# Patient Record
Sex: Male | Born: 1950 | Race: White | Hispanic: No | Marital: Married | Smoking: Former smoker
Health system: Southern US, Community
[De-identification: ages and names within clinical notes are randomized; demographics above are authoritative.]

## PROBLEM LIST (undated history)

## (undated) DIAGNOSIS — R0600 Dyspnea, unspecified: Secondary | ICD-10-CM

## (undated) DIAGNOSIS — J302 Other seasonal allergic rhinitis: Secondary | ICD-10-CM

## (undated) DIAGNOSIS — I10 Essential (primary) hypertension: Secondary | ICD-10-CM

## (undated) DIAGNOSIS — M4800 Spinal stenosis, site unspecified: Secondary | ICD-10-CM

## (undated) DIAGNOSIS — E785 Hyperlipidemia, unspecified: Secondary | ICD-10-CM

## (undated) DIAGNOSIS — J189 Pneumonia, unspecified organism: Secondary | ICD-10-CM

## (undated) DIAGNOSIS — IMO0002 Reserved for concepts with insufficient information to code with codable children: Secondary | ICD-10-CM

## (undated) DIAGNOSIS — Z9989 Dependence on other enabling machines and devices: Secondary | ICD-10-CM

## (undated) DIAGNOSIS — R079 Chest pain, unspecified: Secondary | ICD-10-CM

## (undated) DIAGNOSIS — G4733 Obstructive sleep apnea (adult) (pediatric): Secondary | ICD-10-CM

## (undated) DIAGNOSIS — G629 Polyneuropathy, unspecified: Secondary | ICD-10-CM

## (undated) DIAGNOSIS — I2510 Atherosclerotic heart disease of native coronary artery without angina pectoris: Secondary | ICD-10-CM

## (undated) DIAGNOSIS — M1712 Unilateral primary osteoarthritis, left knee: Secondary | ICD-10-CM

## (undated) DIAGNOSIS — R0609 Other forms of dyspnea: Secondary | ICD-10-CM

## (undated) DIAGNOSIS — K219 Gastro-esophageal reflux disease without esophagitis: Secondary | ICD-10-CM

## (undated) DIAGNOSIS — M1990 Unspecified osteoarthritis, unspecified site: Secondary | ICD-10-CM

## (undated) DIAGNOSIS — J45909 Unspecified asthma, uncomplicated: Secondary | ICD-10-CM

## (undated) HISTORY — PX: KNEE ARTHROSCOPY: SUR90

## (undated) HISTORY — PX: FOOT SURGERY: SHX648

## (undated) HISTORY — PX: WISDOM TOOTH EXTRACTION: SHX21

## (undated) HISTORY — DX: Atherosclerotic heart disease of native coronary artery without angina pectoris: I25.10

## (undated) HISTORY — PX: TONSILLECTOMY: SUR1361

## (undated) HISTORY — PX: JOINT REPLACEMENT: SHX530

## (undated) HISTORY — PX: BACK SURGERY: SHX140

## (undated) HISTORY — DX: Essential (primary) hypertension: I10

## (undated) HISTORY — PX: POSTERIOR LUMBAR FUSION: SHX6036

## (undated) HISTORY — DX: Chest pain, unspecified: R07.9

---

## 1989-02-11 HISTORY — PX: KNEE ARTHROSCOPY: SUR90

## 1997-06-13 HISTORY — PX: ANTERIOR CERVICAL DECOMP/DISCECTOMY FUSION: SHX1161

## 2001-05-29 ENCOUNTER — Ambulatory Visit (HOSPITAL_COMMUNITY): Admission: RE | Admit: 2001-05-29 | Discharge: 2001-05-29 | Payer: Self-pay | Admitting: General Surgery

## 2002-04-10 ENCOUNTER — Encounter: Payer: Self-pay | Admitting: Family Medicine

## 2002-04-10 ENCOUNTER — Ambulatory Visit (HOSPITAL_COMMUNITY): Admission: RE | Admit: 2002-04-10 | Discharge: 2002-04-10 | Payer: Self-pay | Admitting: Family Medicine

## 2002-09-09 ENCOUNTER — Ambulatory Visit (HOSPITAL_COMMUNITY): Admission: RE | Admit: 2002-09-09 | Discharge: 2002-09-09 | Payer: Self-pay | Admitting: Family Medicine

## 2002-09-09 ENCOUNTER — Encounter: Payer: Self-pay | Admitting: Family Medicine

## 2003-07-15 ENCOUNTER — Ambulatory Visit (HOSPITAL_COMMUNITY): Admission: RE | Admit: 2003-07-15 | Discharge: 2003-07-15 | Payer: Self-pay | Admitting: Family Medicine

## 2004-01-09 ENCOUNTER — Ambulatory Visit (HOSPITAL_COMMUNITY): Admission: RE | Admit: 2004-01-09 | Discharge: 2004-01-09 | Payer: Self-pay | Admitting: Orthopedic Surgery

## 2004-11-25 ENCOUNTER — Ambulatory Visit (HOSPITAL_COMMUNITY): Admission: RE | Admit: 2004-11-25 | Discharge: 2004-11-25 | Payer: Self-pay | Admitting: Family Medicine

## 2004-12-30 ENCOUNTER — Ambulatory Visit (HOSPITAL_COMMUNITY): Admission: RE | Admit: 2004-12-30 | Discharge: 2004-12-30 | Payer: Self-pay | Admitting: General Surgery

## 2005-03-01 ENCOUNTER — Ambulatory Visit (HOSPITAL_COMMUNITY): Admission: RE | Admit: 2005-03-01 | Discharge: 2005-03-01 | Payer: Self-pay | Admitting: Family Medicine

## 2005-03-18 ENCOUNTER — Ambulatory Visit (HOSPITAL_COMMUNITY): Admission: RE | Admit: 2005-03-18 | Discharge: 2005-03-18 | Payer: Self-pay | Admitting: Family Medicine

## 2005-04-04 ENCOUNTER — Ambulatory Visit: Payer: Self-pay | Admitting: Orthopedic Surgery

## 2005-05-09 ENCOUNTER — Ambulatory Visit: Payer: Self-pay | Admitting: Orthopedic Surgery

## 2006-02-22 ENCOUNTER — Ambulatory Visit (HOSPITAL_COMMUNITY): Admission: RE | Admit: 2006-02-22 | Discharge: 2006-02-22 | Payer: Self-pay | Admitting: Family Medicine

## 2007-03-16 ENCOUNTER — Encounter: Admission: RE | Admit: 2007-03-16 | Discharge: 2007-03-16 | Payer: Self-pay | Admitting: Neurosurgery

## 2007-04-20 ENCOUNTER — Ambulatory Visit (HOSPITAL_COMMUNITY): Admission: RE | Admit: 2007-04-20 | Discharge: 2007-04-21 | Payer: Self-pay | Admitting: Neurosurgery

## 2007-06-01 ENCOUNTER — Other Ambulatory Visit: Admission: RE | Admit: 2007-06-01 | Discharge: 2007-06-01 | Payer: Self-pay | Admitting: Family Medicine

## 2007-06-01 ENCOUNTER — Encounter (INDEPENDENT_AMBULATORY_CARE_PROVIDER_SITE_OTHER): Payer: Self-pay | Admitting: Family Medicine

## 2007-06-14 HISTORY — PX: ANTERIOR CERVICAL DECOMP/DISCECTOMY FUSION: SHX1161

## 2007-07-18 ENCOUNTER — Inpatient Hospital Stay (HOSPITAL_COMMUNITY): Admission: RE | Admit: 2007-07-18 | Discharge: 2007-07-21 | Payer: Self-pay | Admitting: Neurosurgery

## 2009-06-02 ENCOUNTER — Ambulatory Visit (HOSPITAL_COMMUNITY): Admission: RE | Admit: 2009-06-02 | Discharge: 2009-06-02 | Payer: Self-pay | Admitting: Family Medicine

## 2010-10-26 NOTE — H&P (Signed)
NAMEPIERO, MUSTARD             ACCOUNT NO.:  [id number]   MEDICAL RECORD NO.:  [id number]          PATIENT TYPE:  AMB   LOCATION:  SDS                          FACILITY:  [facility]   PHYSICIAN:  Hilda Lias, M.D.   DATE OF BIRTH:  [birth year]   DATE OF ADMISSION:  04/20/2007  DATE OF DISCHARGE:                              HISTORY & PHYSICAL   Mr. Patrick Mathews is a gentleman whom, 23 years ago, underwent L5-1 fusion.  Now he has been complaining of back pain with radiation down to both  legs.  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  The patient had an MRI of the lumbar spine and cervical.  He  was sent to [place] for evaluation.   PAST MEDICAL HISTORY:  Knee surgery bilaterally, lumbar fusion.   ALLERGIES:  He is not allergic to any medications.   SOCIAL HISTORY:  He drinks socially.  He smokes 5 cigarettes a day.   FAMILY HISTORY:  Negative.   REVIEW OF SYSTEMS:  Positive for back pain.   PHYSICAL EXAMINATION:  HEAD, EARS, NOSE AND THROAT:  Normal.  NECK:  He is able to extend by  lateralization process right down to the  shoulder girdle.  LUNGS:  Clear.  HEART:  Sounds normal.  ABDOMEN:  Normal.  EXTREMITIES:  Normal pulses.  NEURO:  Mental status normal.  Cranial nerves normal.  Strength showed  weakness of the left biceps and triceps.  There is no obvious  fasciculation.  He has decreased flexion of the lumbar spine.   X-ray of the lumbar spine showed that he has a fusion of the L5-1 with  stenosis at the L4-5 with spondylolisthesis.  At the level of C6-C7  cervical, he has a herniated disk with a large fragment going to the  canal.   CLINICAL IMPRESSION:  C6-C7 herniated disk with a free fragment.  L4-5  stenosis.   RECOMMENDATIONS:  The patient is being admitted for surgery.  The  procedure will  be anterior cervical diskectomy at L6-L7 with a bone  graft and a plate.  He knows about the risks such as infection, CSF  leak, worsening pain, paralysis, need for further surgery.  He is fully  aware that this will not resolve the problem in the lumbar area, and  that will be taken care of in the next 6 weeks.          ______________________________  Hilda Lias, M.D.    EB/MEDQ  D:  04/20/2007  T:  04/21/2007  Job:  [id number]

## 2010-10-26 NOTE — H&P (Signed)
NAMEDAVIONE, LENKER             ACCOUNT NO.:  [id number]   MEDICAL RECORD NO.:  [id number]          PATIENT TYPE:  INP   LOCATION:  3009                         FACILITY:  [facility]   PHYSICIAN:  Hilda Lias, M.D.   DATE OF BIRTH:  02-17-1951   DATE OF ADMISSION:  07/18/2007  DATE OF DISCHARGE:                              HISTORY & PHYSICAL   Mr. Arroyave is a gentleman who underwent anterior cervical fusion.  He 23  years ago underwent an L5-S1 fusion in situ with iliac crest because of  spondylolisthesis.  Nevertheless, lately he had been complaining of neck  pain, which already was taken care of, but now he realized that every  time he walks more than two blocks he had to sit to get rid of the pain  in both legs.  He denies any problem with bladder or bowel.   PAST MEDICAL HISTORY:  He has had bilateral knee surgery.  He had a  lumbar fusion 23 years ago, cervical fusion by me last year.   SOCIAL HISTORY:  He drinks socially.  He smokes five cigarettes a day.   FAMILY HISTORY:  Negative.   REVIEW OF SYSTEMS:  Positive for back pain.   PHYSICAL EXAMINATION:  GENERAL:  When the patient came to my office he  was not in any acute distress.  HEENT:  Head, nose and throat normal.  NECK:  There is a scar anteriorly.  He has a good flexibility.  LUNGS:  Clear.  HEART:  Heart sounds normal.  There were __________  pulse.  NEUROLOGIC :  Examination essentially normal with some mild weakness on  dorsiflexion of both feet.  The straight leg raise is positive  bilaterally about 60 degrees.   Lumbar spine x-ray showed a fusion at the level L5-1, which is solid,  but he has a spondylolisthesis at L4-5.   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  He is status post L5-S1 fusion.   RECOMMENDATIONS:  The patient is being admitted for surgery.  The  procedure will be L4-5 diskectomy, interbody fusion and pedicle screws.  He knows about the risks of  infection, CSF leak, worsening pain,  paralysis, need for surgery.           ______________________________  Hilda Lias, M.D.     EB/MEDQ  D:  07/18/2007  T:  07/18/2007  Job:  [id number]

## 2010-10-26 NOTE — Op Note (Signed)
Patrick Mathews, Patrick Mathews             ACCOUNT NO.:  [id number]   MEDICAL RECORD NO.:  [id number]          PATIENT TYPE:  AMB   LOCATION:  SDS                          FACILITY:  [facility]   PHYSICIAN:  Hilda Lias, M.D.   DATE OF BIRTH:  March 17, 1951   DATE OF PROCEDURE:  04/20/2007  DATE OF DISCHARGE:                               OPERATIVE REPORT   PREOPERATIVE DIAGNOSIS:  C6-7 herniated disk with a large fragment to  the left.   POSTOPERATIVE DIAGNOSIS:  C6-7 herniated disk with a large fragment to  the left.   PROCEDURE:  Anterior C6-7 diskectomy, decompression of the spinal cord  and the nerve root, interbody fusion with allograft.  Plate.  Microscope.   SURGEON:  Hilda Lias, M.D.   ASSISTANT:  Coletta Memos, M.D.   CLINICAL HISTORY:  [name] has severe neck pain with radiation to the  left upper extremity.  X-rays show a large herniated disk at the level  of C6-7 central to the left.  Also, he had stenosis at L4-5.  Surgery  was advised.  He knows that this will not take care of the problem with  the lumbar spine.   PROCEDURE:  [name] was taken to the OR and after intubation, the  left side of the neck was prepped with DuraPrep.  Transverse incision  was made through the skin, subcutaneous tissue and platysma down to the  cervical spine.  X-ray showed that the needle was at the level of C5-6.  The patient has big shoulders.  From then on we dropped down and we  opened the anterior ligament at the level of C6-7.  A large amount of  degenerative disk was removed.  Total diskectomy using the microscope  and the drill was achieved.  We opened the posterior ligament.  The  patient had quite a bit of stenosis to the right side.  To the left side  we found at least 6-7 fragments displacing the spinal cord and the nerve  root.  Gross total decompression was accomplished not only at the level  of the foramen but also central.  Then a stem was inserted in the  allograft, a 9  mm was inserted, followed by four screws.  Lateral  cervical spine:  Were able to see only up to the level of C5-6.  Nevertheless, by visualization we knew we were at the body of C6 and C7.  From then on the area was irrigated and once we achieved complete  hemostasis, the wound was closed with Vicryl and a Steri-Strip.           ______________________________  Hilda Lias, M.D.     EB/MEDQ  D:  04/20/2007  T:  04/21/2007  Job:  [id number]

## 2010-10-26 NOTE — Op Note (Signed)
NAMEEUGEAN, ARNOTT             ACCOUNT NO.:  [id number]   MEDICAL RECORD NO.:  [id number]          PATIENT TYPE:  INP   LOCATION:  3009                         FACILITY:  [facility]   PHYSICIAN:  Hilda Lias, M.D.   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  Neurogenic claudication.  Chronic radiculopathy.  Status post in situ  fusion, L5-S1.   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  Neurogenic claudication.  Chronic radiculopathy.  Status post in situ  fusion, L5-S1.   PROCEDURE:  Bilateral L4 laminectomy, bilateral L4-5 diskectomy and  facetectomy, interbody fusion with cages, pedicle screws L4-5,  posterolateral arthrodesis with autograft and BMP and Cell Saver  __________  .   SURGEON:  Hilda Lias, M.D.   ASSISTANT:  Coletta Memos, M.D.   CLINICAL HISTORY:  Mr. Gammel is a gentleman who had been complaining of  back pain progressing to both legs.  Twenty-five years ago he underwent  in situ fusion of L5-1.  X-rays show severe stenosis with  spondylolisthesis at L4-L5.  Surgery was advised and the risks were  explained to them.   PROCEDURE:  The patient was taken to the OR and after intubation he was  positioned in a prone manner.  The skin was cleaned with DuraPrep.  Midline incision was made through the previous one.  We were able to  find the spinous process of L4.  The incision was carried out all the  way laterally until we were able to see and feel the facet of 4-5.  The  patient had quite a bit of scar tissue and the most difficult part of  the procedure was to do lysis of adhesions to be able to identify the  dural sac.  Once this was done, x-rays showed that we probably were at  the L4-5.  From then on we tried to get into the space but it was quite  narrow with quite a  bit of adhesion.  Because of that, we proceeded with  bilateral L4 facetectomy.  This allowed [place] to get into the disk space  and total gross diskectomy was achieved.  The endplates were drilled.  Then 2 cages of 14 x 22 with autograft and BMP were introduced.  Then  using the C-arm in AP and lateral view we drilled the pedicle of 4 and  5.  Then a a probe was inserted and then we introduced 4 screws of 6.5 x  50, two of them at L4 and 6.5 x 45 at the level of L5.  X-rays showed  good alignment.  Then the screws were connected with the rod  and  capped.  A cross-link was used to secure the area.  We went laterally  and we drilled the lateral aspect of the facet at L4-5 as well as the  takeoff of the transverse process.  A mix of autograft and BMP was used  for posterolateral arthrodesis.  At the end we checked  again the foramen  and the L4 and L5 were decompressed bilaterally.  Valsalva maneuver was  negative.  The wound was closed with Vicryl and Steri-Strips.           ______________________________  Hilda Lias, M.D.    EB/MEDQ  D:  07/18/2007  T:  07/19/2007  Job:  [id number]

## 2010-10-29 NOTE — H&P (Signed)
Patrick Mathews, Patrick Mathews             ACCOUNT NO.:  [id number]   MEDICAL RECORD NO.:  [id number]          PATIENT TYPE:  AMB   LOCATION:                                FACILITY:  [facility]   PHYSICIAN:  Dalia Heading, M.D.  DATE OF BIRTH:  01/12/1951   DATE OF ADMISSION:  12/30/2004  DATE OF DISCHARGE:  LH                                HISTORY & PHYSICAL   CHIEF COMPLAINT:  Longstanding gastrointestinal reflux disease.   HISTORY OF PRESENT ILLNESS:  The patient is a 60 year old white male who was  referred for endoscopic evaluation.  He needs an EGD for recurrent GERD.  He  has had longstanding intermittent GERD with heartburn.  Multiple family  members suffer from GERD.  He has never had an EGD.  Occasional dysphagia is  noted.  No abdominal pain, weight loss, nausea, vomiting, diarrhea,  constipation, hematochezia or melena have been noted.   PAST MEDICAL HISTORY:  Unremarkable.   PAST SURGICAL HISTORY:  Colonoscopy in 2002.   CURRENT MEDICATIONS:  Lipitor, Aciphex and Zyrtec.   ALLERGIES:  No known drug allergies.   REVIEW OF SYSTEMS:  Noncontributory.   PHYSICAL EXAMINATION:  GENERAL APPEARANCE:  The patient is a well-developed,  well-nourished, white male in no acute distress.  VITAL SIGNS:  He is afebrile and vital signs are stable.  LUNGS:  Clear to auscultation with equal breath sounds bilaterally.  HEART:  Regular rate and rhythm without S3, S4 or murmurs.  ABDOMEN:  Soft, nontender and nondistended.  No hepatosplenomegaly or masses  are noted.   IMPRESSION:  Gastrointestinal reflux disease.   PLAN:  The patient is scheduled for an EGD on December 30, 2004.  The risks and  benefits of the procedure, including bleeding and perforation, were fully  explained to the patient, who gave informed consent.      Dalia Heading, M.D.  Electronically Signed     MAJ/MEDQ  D:  12/07/2004  T:  12/07/2004  Job:  [id number]   cc:   [facility]  Fax: [phone]   Corrie Mckusick, M.D.  Fax: [phone]

## 2010-10-29 NOTE — Discharge Summary (Signed)
NAMECAYLON, Patrick Mathews             ACCOUNT NO.:  [id number]   MEDICAL RECORD NO.:  [id number]          PATIENT TYPE:  INP   LOCATION:  3009                         FACILITY:  [facility]   PHYSICIAN:  Hilda Lias, M.D.   DATE OF BIRTH:  [birth year]   DATE OF ADMISSION:  07/18/2007  DATE OF DISCHARGE:  07/21/2007                               DISCHARGE SUMMARY   ADMISSION DIAGNOSES:  1. 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  2. L4-5 spondylolisthesis.   DISCHARGE DIAGNOSES:  1. 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  2. L4-5 spondylolisthesis.   CLINICAL HISTORY:  The patient was admitted because of back pain with  radiation to both legs.  Previously this gentleman underwent an anterior  cervical diskectomy.  Surgery was advised.   HOSPITAL COURSE:  The patient was taken to surgery on July 18, 2007,  and an L4-5 diskectomy was done.  Today at almost 72 hours after surgery  the patient was discharged by Dr. Danae Orleans. [name].   CONDITION ON DISCHARGE:  Improved.   DISCHARGE MEDICATIONS:  1. Percocet.  2. Diazepam.   DIET:  Regular diet.   ACTIVITY:  Not to drive for the next two weeks.   FOLLOWUP:  He is going to be seen by me in four weeks.           ______________________________  Hilda Lias, M.D.     EB/MEDQ  D:  08/21/2007  T:  08/21/2007  Job:  [id number]

## 2010-10-29 NOTE — H&P (Signed)
[facility]  Patient:    Patrick, Mathews Visit Number: [id number] MRN: [id number]          Service Type: END Location: DAY Attending Physician:  Dalia Heading Dictated by:   Franky Macho, M.D. Admit Date:  05/29/2001 Discharge Date: 05/29/2001   CC:         Colette Ribas, M.D.                         History and Physical  DATE OF BIRTH:  09/06/1950  CHIEF COMPLAINT:  Need for screening colonoscopy.  HISTORY OF PRESENT ILLNESS:  Patient is a 60 year old white male who is referred for a screening colonoscopy.  He denies any lightheadedness, weight loss, fever, abdominal pain, constipation, diarrhea, hematochezia, melena.  He has never had a colonoscopy.  Denies any hemorrhoidal problems.  There is no family history of colon carcinoma.  PAST MEDICAL HISTORY:  Unremarkable.  PAST SURGICAL HISTORY:  Knee surgery and back surgery.  CURRENT MEDICATIONS:  Aspirin daily.  ALLERGIES:  SULFA.  REVIEW OF SYSTEMS:  Unremarkable.  PHYSICAL EXAMINATION  GENERAL:  Patient is a well-developed, well-nourished white male in no acute distress.  VITAL SIGNS:  Afebrile.  Vital signs are stable.  LUNGS:  Clear to auscultation with equal breath sounds bilaterally.  HEART:  Regular rate and rhythm without S3, S4, murmurs.  ABDOMEN:  Soft, nontender, nondistended.  No hepatosplenomegaly or masses are noted.  RECTAL:  Deferred until the procedure.  IMPRESSION:  Need for screening colonoscopy.  PLAN:  The patient is scheduled for a colonoscopy on May 29, 2001.  The risks and benefits of the procedure including bleeding and perforation were fully explained to the patient.  Gave him informed consent. Dictated by:   Franky Macho, M.D. Attending Physician:  Dalia Heading DD:  05/24/01 TD:  05/24/01 Job: [id number]

## 2011-03-03 LAB — BASIC METABOLIC PANEL
BUN: 10
CO2: 30
Calcium: 9.3
Chloride: 102
Creatinine, Ser: 1.09
GFR calc Af Amer: >60
GFR calc non Af Amer: >60
Glucose, Bld: 100 — ABNORMAL HIGH
Potassium: 3.8
Sodium: 138

## 2011-03-03 LAB — CBC
HCT: 43.8
Hemoglobin: 14.9
MCHC: 34.1
MCV: 90
Platelets: 216
RBC: 4.86
RDW: 13
WBC: 6.2

## 2011-03-04 LAB — ABO/RH: ABO/RH(D): O POS

## 2011-03-04 LAB — TYPE AND SCREEN
ABO/RH(D): O POS
Antibody Screen: NEGATIVE

## 2011-03-22 LAB — CBC
HCT: 44.8
Hemoglobin: 15.3
MCHC: 34
MCV: 91.4
Platelets: 221
RBC: 4.9
RDW: 13.3
WBC: 5.5

## 2011-06-14 HISTORY — PX: FOOT SURGERY: SHX648

## 2011-06-14 HISTORY — PX: BUNIONECTOMY: SHX129

## 2012-10-31 ENCOUNTER — Encounter (HOSPITAL_COMMUNITY): Payer: Self-pay | Admitting: Pharmacy Technician

## 2012-10-31 NOTE — H&P (Signed)
  NTS SOAP Note  Vital Signs:  Vitals as of: 10/30/2012: Systolic 114: Diastolic 76: Heart Rate 75: Temp 98.52F: Height 97ft 11in: Weight 221Lbs 0 Ounces: BMI 30.82  BMI : 30.82 kg/m2  Subjective: This 62 Years 53 Months old Male presents for screening colonoscopy. Patient  last had a colonoscopy over 10 years ago. He denies any GI complaints. No family history of colon cancer.  Review of Symptoms:  Constitutional:unremarkable   Head:unremarkable    Eyes:unremarkable   Nose/Mouth/Throat:unremarkable Cardiovascular:  unremarkable   Respiratory:unremarkable   Gastrointestinal:  unremarkable   Genitourinary:unremarkable        Joint, back, and neck pain Skin:unremarkable Hematolgic/Lymphatic:unremarkable        hayfever   Past Medical History:    Reviewed   Past Medical History  Surgical History:  back, foot, knee surgeries Medical Problems:  high cholesterol levels Allergies: nkda Medications: lipitor, gabapentin, meloxicam, aciphex   Social History:Reviewed  Social History  Preferred Language: English Race:  White Ethnicity: Not Hispanic / Latino Age: 62 Years 3 Months Marital Status:  M Alcohol:  No Recreational drug(s):  No   Smoking Status: Unknown if ever smoked reviewed on 10/30/2012 Functional Status reviewed on mm/dd/yyyy ------------------------------------------------ Bathing: Normal Cooking: Normal Dressing: Normal Driving: Normal Eating: Normal Managing Meds: Normal Oral Care: Normal Shopping: Normal Toileting: Normal Transferring: Normal Walking: Normal Cognitive Status reviewed on mm/dd/yyyy ------------------------------------------------ Attention: Normal Decision Making: Normal Language: Normal Memory: Normal Motor: Normal Perception: Normal Problem Solving: Normal Visual and Spatial: Normal   Family History:  Reviewed  Family Health History Mother, Unknown; Coronary arteriosclerosis;   Father, Unknown; Healthy;     Objective Information: General:  Well appearing, well nourished in no distress. Heart:  RRR, no murmur Lungs:    CTA bilaterally, no wheezes, rhonchi, rales.  Breathing unlabored. Abdomen:Soft, NT/ND, no HSM, no masses.    deferred to procedure  Assessment: need for screening colonoscopy  Diagnosis &amp; Procedure Smart Code   Plan: scheduled for colonoscopy on 11/06/2012   Patient Education:Alternative treatments to surgery were discussed with patient (and family).  Risks and benefits  of procedure were fully explained to the patient (and family) who gave informed consent. Patient/family questions were addressed.  Follow-up:Pending Surgery

## 2012-11-06 ENCOUNTER — Ambulatory Visit (HOSPITAL_COMMUNITY)
Admission: RE | Admit: 2012-11-06 | Discharge: 2012-11-06 | Disposition: A | Discharge status: Home / Self Care | Payer: 59 | Source: Ambulatory Visit | Attending: General Surgery | Admitting: General Surgery

## 2012-11-06 ENCOUNTER — Encounter (HOSPITAL_COMMUNITY): Payer: Self-pay | Admitting: *Deleted

## 2012-11-06 ENCOUNTER — Encounter (HOSPITAL_COMMUNITY)
Admission: RE | Disposition: A | Discharge status: Home / Self Care | Payer: Self-pay | Source: Ambulatory Visit | Attending: General Surgery

## 2012-11-06 DIAGNOSIS — Z1211 Encounter for screening for malignant neoplasm of colon: Secondary | ICD-10-CM | POA: Insufficient documentation

## 2012-11-06 DIAGNOSIS — E7800 Pure hypercholesterolemia, unspecified: Secondary | ICD-10-CM | POA: Insufficient documentation

## 2012-11-06 HISTORY — DX: Gastro-esophageal reflux disease without esophagitis: K21.9

## 2012-11-06 HISTORY — PX: COLONOSCOPY: SHX5424

## 2012-11-06 HISTORY — DX: Hyperlipidemia, unspecified: E78.5

## 2012-11-06 SURGERY — COLONOSCOPY
Anesthesia: Moderate Sedation

## 2012-11-06 MED ORDER — MEPERIDINE HCL 100 MG/ML IJ SOLN
INTRAMUSCULAR | Status: AC
  Filled 2012-11-06: qty 1

## 2012-11-06 MED ORDER — MIDAZOLAM HCL 5 MG/5ML IJ SOLN
INTRAMUSCULAR | Status: DC | PRN
  Administered 2012-11-06: 3 mg via INTRAVENOUS

## 2012-11-06 MED ORDER — SODIUM CHLORIDE 0.9 % IV SOLN
INTRAVENOUS | Status: DC
  Administered 2012-11-06: 09:00:00 via INTRAVENOUS

## 2012-11-06 MED ORDER — MIDAZOLAM HCL 5 MG/5ML IJ SOLN
INTRAMUSCULAR | Status: AC
  Filled 2012-11-06: qty 10

## 2012-11-06 MED ORDER — MEPERIDINE HCL 25 MG/ML IJ SOLN
INTRAMUSCULAR | Status: DC | PRN
  Administered 2012-11-06: 50 mg via INTRAVENOUS

## 2012-11-06 NOTE — Op Note (Signed)
[facility] [address] [place], [address]   COLONOSCOPY PROCEDURE REPORT  PATIENT: Patrick Mathews, Patrick Mathews  MR#: [id number] BIRTHDATE: [birth year] , 62  yrs. old GENDER: Male ENDOSCOPIST: Franky Macho, MD REFERRED XB:[id number], John PROCEDURE DATE:  11/06/2012 PROCEDURE:   Colonoscopy, screening ASA CLASS:   Class II INDICATIONS:Average risk patient for colon cancer. MEDICATIONS: Versed 3 mg IV and Demerol 50 mg IV  DESCRIPTION OF PROCEDURE:   After the risks benefits and alternatives of the procedure were thoroughly explained, informed consent was obtained.  A digital rectal exam revealed no abnormalities of the rectum.   The EC-[id number] ([id number])  endoscope was introduced through the anus and advanced to the cecum, which was identified by both the appendix and ileocecal valve. No adverse events experienced.   The quality of the prep was adequate, using MoviPrep  The instrument was then slowly withdrawn as the colon was fully examined.      COLON FINDINGS: A normal appearing cecum, ileocecal valve, and appendiceal orifice were identified.  The ascending, hepatic flexure, transverse, splenic flexure, descending, sigmoid colon and rectum appeared unremarkable.  No polyps or cancers were seen. Retroflexed views revealed no abnormalities. The time to cecum=3 minutes 0 seconds.  Withdrawal time=4 minutes 0 seconds.  The scope was withdrawn and the procedure completed. COMPLICATIONS: There were no complications.  ENDOSCOPIC IMPRESSION: Normal colon  RECOMMENDATIONS: Repeat Colonscopy in 10 years.   eSigned:  Franky Macho, MD 11/06/2012 9:48 AM   cc:

## 2012-11-07 ENCOUNTER — Encounter (HOSPITAL_COMMUNITY): Payer: Self-pay | Admitting: General Surgery

## 2013-08-19 DIAGNOSIS — M5414 Radiculopathy, thoracic region: Secondary | ICD-10-CM | POA: Insufficient documentation

## 2013-10-08 ENCOUNTER — Ambulatory Visit (HOSPITAL_COMMUNITY)
Admission: RE | Admit: 2013-10-08 | Discharge: 2013-10-08 | Disposition: A | Discharge status: Home / Self Care | Payer: 59 | Source: Ambulatory Visit | Attending: Family Medicine | Admitting: Family Medicine

## 2013-10-08 ENCOUNTER — Other Ambulatory Visit (HOSPITAL_COMMUNITY): Payer: Self-pay | Admitting: Family Medicine

## 2013-10-08 DIAGNOSIS — R0602 Shortness of breath: Secondary | ICD-10-CM | POA: Insufficient documentation

## 2013-10-08 DIAGNOSIS — J209 Acute bronchitis, unspecified: Secondary | ICD-10-CM

## 2013-10-08 DIAGNOSIS — R059 Cough, unspecified: Secondary | ICD-10-CM | POA: Insufficient documentation

## 2013-10-08 DIAGNOSIS — R05 Cough: Secondary | ICD-10-CM | POA: Insufficient documentation

## 2013-10-08 DIAGNOSIS — R0989 Other specified symptoms and signs involving the circulatory and respiratory systems: Secondary | ICD-10-CM | POA: Insufficient documentation

## 2013-10-08 DIAGNOSIS — Z87891 Personal history of nicotine dependence: Secondary | ICD-10-CM | POA: Insufficient documentation

## 2013-11-08 ENCOUNTER — Other Ambulatory Visit (HOSPITAL_COMMUNITY): Payer: Self-pay | Admitting: Family Medicine

## 2013-11-08 ENCOUNTER — Ambulatory Visit (HOSPITAL_COMMUNITY)
Admission: RE | Admit: 2013-11-08 | Discharge: 2013-11-08 | Disposition: A | Discharge status: Home / Self Care | Payer: 59 | Source: Ambulatory Visit | Attending: Family Medicine | Admitting: Family Medicine

## 2013-11-08 DIAGNOSIS — R059 Cough, unspecified: Secondary | ICD-10-CM | POA: Insufficient documentation

## 2013-11-08 DIAGNOSIS — R748 Abnormal levels of other serum enzymes: Secondary | ICD-10-CM

## 2013-11-08 DIAGNOSIS — D72829 Elevated white blood cell count, unspecified: Secondary | ICD-10-CM

## 2013-11-08 DIAGNOSIS — J189 Pneumonia, unspecified organism: Secondary | ICD-10-CM

## 2013-11-08 DIAGNOSIS — R05 Cough: Secondary | ICD-10-CM | POA: Insufficient documentation

## 2013-11-11 DIAGNOSIS — J189 Pneumonia, unspecified organism: Secondary | ICD-10-CM

## 2013-11-11 HISTORY — DX: Pneumonia, unspecified organism: J18.9

## 2014-04-07 ENCOUNTER — Other Ambulatory Visit (HOSPITAL_COMMUNITY): Payer: Self-pay | Admitting: Family Medicine

## 2014-04-08 ENCOUNTER — Other Ambulatory Visit (HOSPITAL_COMMUNITY): Payer: Self-pay | Admitting: Family Medicine

## 2014-04-08 DIAGNOSIS — R918 Other nonspecific abnormal finding of lung field: Secondary | ICD-10-CM

## 2014-04-16 ENCOUNTER — Ambulatory Visit (HOSPITAL_COMMUNITY): Payer: 59

## 2014-04-23 ENCOUNTER — Ambulatory Visit (HOSPITAL_COMMUNITY)
Admission: RE | Admit: 2014-04-23 | Discharge: 2014-04-23 | Disposition: A | Discharge status: Home / Self Care | Payer: 59 | Source: Ambulatory Visit | Attending: Family Medicine | Admitting: Family Medicine

## 2014-04-23 DIAGNOSIS — R918 Other nonspecific abnormal finding of lung field: Secondary | ICD-10-CM

## 2014-04-23 LAB — POCT I-STAT CREATININE: Creatinine, Ser: 1.2 mg/dL (ref 0.50–1.35)

## 2014-04-23 MED ORDER — IOHEXOL 300 MG/ML  SOLN
80.0000 mL | Freq: Once | INTRAMUSCULAR | Status: AC | PRN
Start: 2014-04-23 — End: 2014-04-23
  Administered 2014-04-23: 80 mL via INTRAVENOUS

## 2014-05-07 ENCOUNTER — Encounter: Payer: Self-pay | Admitting: Internal Medicine

## 2014-05-07 ENCOUNTER — Ambulatory Visit (INDEPENDENT_AMBULATORY_CARE_PROVIDER_SITE_OTHER): Payer: 59 | Admitting: Internal Medicine

## 2014-05-07 VITALS — BP 130/80 | HR 60 | Ht 70.0 in | Wt 229.0 lb

## 2014-05-07 DIAGNOSIS — R0600 Dyspnea, unspecified: Secondary | ICD-10-CM

## 2014-05-07 DIAGNOSIS — R918 Other nonspecific abnormal finding of lung field: Secondary | ICD-10-CM

## 2014-05-07 NOTE — Assessment & Plan Note (Signed)
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  This is too small too bx or PET scan so the only option is excisional bx vs watch in 3 m with apples to apples comparison using same CT scanner and that is what I would rec, along with Quantiferon GOLD on return here.  Discussed in detail all the  indications, usual  risks and alternatives  relative to the benefits with patient who agrees to proceed with conservative f/u as planned

## 2014-05-07 NOTE — Patient Instructions (Addendum)
You will need a follow up CT chest a year from now and I will let Dr Hilma Favors know and place you in our recall file   You are cleared for knee surgery  Late add: rec 3 m f/u with ct at same location as 11/11 and ov in with pfts in 3 months (all same day hopefully)

## 2014-05-07 NOTE — Assessment & Plan Note (Signed)
Assoc with wt gain/ likely deconditioning related to acute illness but nothing on exam or by CT chest that would contraindicate clearance for knee surgery at this point.

## 2014-05-07 NOTE — Progress Notes (Signed)
Subjective:     Patient ID: Patrick Mathews, male   DOB: [birth year]   MRN: [id number]  HPI  73 yowm quit smoking Jan 2015  Able to do HS sports s inhalers then started using inhalers prn in the 1980s no clear pattern and no need for inhalers x 2005  then pna dx around spring 2015 with pna assoc with  fever, aches, cough, eval by Dr Hilma Favors first with cxr ? RLL infiltrate then  CT showing mpns  and pt referred to pulmonary clinic 05/07/2014 by Dr Hilma Favors for f/u nodules and need to be cleared for L TKR.  05/07/2014 1st [facility] office visit/ Osmani Kersten   Chief Complaint  Patient presents with  . Pulmonary Consult    Referred by Dr. Sharilyn Sites for eval of pulmonary nodules. Pt has noticed occ DOE for the past 6 months- gets SOB when playing golf.    new doe since pna x 6 months has not tried any saba. Only sob p 18 holes of golf and really more limited by knee than sob.   No obvious day to day or daytime variabilty or assoc chronic cough or cp or chest tightness, subjective wheeze overt sinus or hb symptoms. No unusual exp hx or h/o childhood pna/ asthma or knowledge of premature birth.  Sleeping ok without nocturnal  or early am exacerbation  of respiratory  c/o's or need for noct saba. Also denies any obvious fluctuation of symptoms with weather or environmental changes or other aggravating or alleviating factors except as outlined above   Current Medications, Allergies, Complete Past Medical History, Past Surgical History, Family History, and Social History were reviewed in [employer] record.  ROS  The following are not active complaints unless bolded sore throat, dysphagia, dental problems, itching, sneezing,  nasal congestion or excess/ purulent secretions, ear ache,   fever, chills, sweats, unintended wt loss, pleuritic or exertional cp, hemoptysis,  orthopnea pnd or leg swelling, presyncope, palpitations, heartburn, abdominal pain, anorexia, nausea, vomiting,  diarrhea  or change in bowel or urinary habits, change in stools or urine, dysuria,hematuria,  rash, arthralgias, visual complaints, headache, numbness weakness or ataxia or problems with walking or coordination,  change in mood/affect or memory.             Review of Systems     Objective:   Physical Exam    amb pleasant wm nad  Wt Readings from Last 3 Encounters:  05/07/14 229 lb (103.874 kg)  11/06/12 215 lb 8 oz (97.75 kg)    Vital signs reviewed   HEENT: nl dentition, turbinates, and orophanx. Nl external ear canals without cough reflex   NECK :  without JVD/Nodes/TM/ nl carotid upstrokes bilaterally   LUNGS: no acc muscle use, clear to A and P bilaterally without cough on insp or exp maneuvers   CV:  RRR  no s3 or murmur or increase in P2, no edema   ABD:  soft and nontender with nl excursion in the supine position. No bruits or organomegaly, bowel sounds nl  MS:  warm without deformities, calf tenderness, cyanosis or clubbing  SKIN: warm and dry without lesions    NEURO:  alert, approp, no deficits     Assessment:

## 2014-05-09 ENCOUNTER — Telehealth: Payer: Self-pay | Admitting: *Deleted

## 2014-05-09 DIAGNOSIS — R918 Other nonspecific abnormal finding of lung field: Secondary | ICD-10-CM

## 2014-05-09 DIAGNOSIS — R0600 Dyspnea, unspecified: Secondary | ICD-10-CM

## 2014-05-09 NOTE — Telephone Encounter (Signed)
Spoke with the pt and notified of recs per MW  He verbalized understanding  Nothing further needed I have placed order for ct chest for 3 months and 3 month reminder placed

## 2014-05-09 NOTE — Telephone Encounter (Signed)
-----   Message from Tanda Rockers, MD sent at 05/07/2014  8:03 PM EST -----  rec 3 m f/u with ct at same location as 11/11 and ov in with pfts in 3 months (all same day hopefully)

## 2014-06-04 ENCOUNTER — Encounter (HOSPITAL_COMMUNITY): Payer: Self-pay | Admitting: Physician Assistant

## 2014-06-04 DIAGNOSIS — M1712 Unilateral primary osteoarthritis, left knee: Secondary | ICD-10-CM | POA: Diagnosis present

## 2014-06-04 DIAGNOSIS — M549 Dorsalgia, unspecified: Secondary | ICD-10-CM | POA: Diagnosis present

## 2014-06-04 DIAGNOSIS — K219 Gastro-esophageal reflux disease without esophagitis: Secondary | ICD-10-CM | POA: Diagnosis present

## 2014-06-04 DIAGNOSIS — E785 Hyperlipidemia, unspecified: Secondary | ICD-10-CM | POA: Diagnosis present

## 2014-06-04 NOTE — H&P (Signed)
TOTAL KNEE ADMISSION H&P  Patient is being admitted for left total knee arthroplasty.  Subjective:  Chief Complaint:left knee pain.  HPI: Patrick Mathews, 63 y.o. male, has a history of pain and functional disability in the left knee due to arthritis and has failed non-surgical conservative treatments for greater than 12 weeks to includeNSAID's and/or analgesics, corticosteriod injections, viscosupplementation injections, flexibility and strengthening excercises, supervised PT with diminished ADL's post treatment, use of assistive devices, weight reduction as appropriate and activity modification.  Onset of symptoms was gradual, starting 10 years ago with gradually worsening course since that time. The patient noted prior procedures on the knee to include  arthroscopy and menisectomy on the left knee(s).  Patient currently rates pain in the left knee(s) at 10 out of 10 with activity. Patient has night pain, worsening of pain with activity and weight bearing, pain that interferes with activities of daily living, crepitus and joint swelling.  Patient has evidence of subchondral sclerosis, periarticular osteophytes and joint space narrowing by imaging studies. There is no active infection.  Patient Active Problem List   Diagnosis Date Noted  . Primary localized osteoarthritis of left knee   . Hyperlipidemia   . GERD (gastroesophageal reflux disease)   . Back pain   . Dyspnea 05/07/2014  . Multiple pulmonary nodules 05/07/2014   Past Medical History  Diagnosis Date  . Hyperlipidemia   . GERD (gastroesophageal reflux disease)   . Primary localized osteoarthritis of left knee   . Back pain     Past Surgical History  Procedure Laterality Date  . Back surgery      x3 Dr Joya Salm  . Knee arthroscopy Bilateral   . Bunionectomy Right   . Colonoscopy N/A 11/06/2012    Procedure: COLONOSCOPY;  Surgeon: Jamesetta So, MD;  Location: [facility];  Service: Gastroenterology;  Laterality: N/A;     No current facility-administered medications for this encounter. Current outpatient prescriptions: ibuprofen (ADVIL,MOTRIN) 200 MG tablet, Take 400 mg by mouth every 6 (six) hours as needed for pain., Disp: , Rfl: ;  RABEprazole (ACIPHEX) 20 MG tablet, Take 20 mg by mouth daily., Disp: , Rfl: ;  atorvastatin (LIPITOR) 20 MG tablet, Take 20 mg by mouth daily., Disp: , Rfl:  Allergies  Allergen Reactions  . Morphine And Related Nausea And Vomiting    History  Substance Use Topics  . Smoking status: Current Some Day Smoker -- 40 years    Types: Cigarettes, Cigars    Last Attempt to Quit: 06/13/2012  . Smokeless tobacco: Never Used     Comment: smoked cigs on and off x 40 yrs- none since 2014, but does smoke occ cigar- Nov 2015  . Alcohol Use: No    Family History  Problem Relation Age of Onset  . Colon cancer Neg Hx   . Colon polyps Neg Hx      Review of Systems  Constitutional: Negative.   HENT: Negative.   Eyes: Negative.   Respiratory: Negative.   Cardiovascular: Negative.   Gastrointestinal: Negative.   Genitourinary: Negative.   Musculoskeletal: Positive for back pain and joint pain.  Skin: Negative.   Neurological: Negative.   Endo/Heme/Allergies: Negative.   Psychiatric/Behavioral: Negative.     Objective:  Physical Exam  Constitutional: He is oriented to person, place, and time. He appears well-developed and well-nourished.  HENT:  Head: Normocephalic and atraumatic.  Mouth/Throat: Oropharynx is clear and moist.  Eyes: Conjunctivae are normal. Pupils are equal, round, and reactive to light.  Neck: Neck supple.  Cardiovascular: Normal rate and regular rhythm.   Respiratory: Effort normal and breath sounds normal.  GI: Soft.  Genitourinary:  Not pertinent to current symptomatology therefore not examined.  Musculoskeletal:  Examination of his left knee reveals 2+ crepitation 1+ synovitis moderate varus deformity diffuse pain, range of motion -5 to 115 degrees  knee is stable. Exam of his right knee reveals 1+ crepitation 1+ synovitis full range of motion knee is stable with normal patella tracking. Vascular exam: pulses 2+ and symmetric.  Neurological: He is alert and oriented to person, place, and time.  Skin: Skin is warm and dry.  Psychiatric: He has a normal mood and affect. His behavior is normal.    Vital signs in last 24 hours: Temp:  [97.6 F (36.4 C)] 97.6 F (36.4 C) (12/23 0900) Pulse Rate:  [62] 62 (12/23 0900) BP: (113)/(76) 113/76 mmHg (12/23 0900) SpO2:  [98 %] 98 % (12/23 0900) Weight:  [99.791 kg (220 lb)] 99.791 kg (220 lb) (12/23 0900)  Labs:   Estimated body mass index is 31.57 kg/(m^2) as calculated from the following:   Height as of this encounter: 5\' 10"  (1.778 m).   Weight as of this encounter: 99.791 kg (220 lb).   Imaging Review Plain radiographs demonstrate severe degenerative joint disease of the left knee(s). The overall alignment issignificant varus. The bone quality appears to be good for age and reported activity level.  Assessment/Plan:  End stage arthritis, left knee  Active Problems:   Multiple pulmonary nodules   Primary localized osteoarthritis of left knee   Hyperlipidemia   GERD (gastroesophageal reflux disease)   Back pain   The patient history, physical examination, clinical judgment of the provider and imaging studies are consistent with end stage degenerative joint disease of the left knee(s) and total knee arthroplasty is deemed medically necessary. The treatment options including medical management, injection therapy arthroscopy and arthroplasty were discussed at length. The risks and benefits of total knee arthroplasty were presented and reviewed. The risks due to aseptic loosening, infection, stiffness, patella tracking problems, thromboembolic complications and other imponderables were discussed. The patient acknowledged the explanation, agreed to proceed with the plan and consent was  signed. Patient is being admitted for inpatient treatment for surgery, pain control, PT, OT, prophylactic antibiotics, VTE prophylaxis, progressive ambulation and ADL's and discharge planning. The patient is planning to be discharged home with home health services Jarett Dralle A. Kaleen Mask Physician Assistant [facility] [phone]  06/04/2014, 10:46 AM

## 2014-06-05 ENCOUNTER — Encounter (HOSPITAL_COMMUNITY)
Admission: RE | Admit: 2014-06-05 | Discharge: 2014-06-05 | Disposition: A | Discharge status: Home / Self Care | Payer: 59 | Source: Ambulatory Visit | Attending: Orthopedic Surgery | Admitting: Orthopedic Surgery

## 2014-06-05 ENCOUNTER — Encounter (HOSPITAL_COMMUNITY): Payer: Self-pay

## 2014-06-05 ENCOUNTER — Other Ambulatory Visit: Payer: Self-pay

## 2014-06-05 DIAGNOSIS — F17290 Nicotine dependence, other tobacco product, uncomplicated: Secondary | ICD-10-CM | POA: Diagnosis not present

## 2014-06-05 DIAGNOSIS — Z981 Arthrodesis status: Secondary | ICD-10-CM | POA: Insufficient documentation

## 2014-06-05 DIAGNOSIS — R918 Other nonspecific abnormal finding of lung field: Secondary | ICD-10-CM | POA: Insufficient documentation

## 2014-06-05 DIAGNOSIS — J302 Other seasonal allergic rhinitis: Secondary | ICD-10-CM | POA: Insufficient documentation

## 2014-06-05 DIAGNOSIS — I452 Bifascicular block: Secondary | ICD-10-CM | POA: Insufficient documentation

## 2014-06-05 DIAGNOSIS — M549 Dorsalgia, unspecified: Secondary | ICD-10-CM | POA: Insufficient documentation

## 2014-06-05 DIAGNOSIS — E669 Obesity, unspecified: Secondary | ICD-10-CM | POA: Diagnosis not present

## 2014-06-05 DIAGNOSIS — Z01818 Encounter for other preprocedural examination: Secondary | ICD-10-CM | POA: Insufficient documentation

## 2014-06-05 DIAGNOSIS — R0609 Other forms of dyspnea: Secondary | ICD-10-CM | POA: Insufficient documentation

## 2014-06-05 DIAGNOSIS — M1990 Unspecified osteoarthritis, unspecified site: Secondary | ICD-10-CM | POA: Insufficient documentation

## 2014-06-05 HISTORY — DX: Pneumonia, unspecified organism: J18.9

## 2014-06-05 HISTORY — DX: Other seasonal allergic rhinitis: J30.2

## 2014-06-05 LAB — URINALYSIS, ROUTINE W REFLEX MICROSCOPIC
Bilirubin Urine: NEGATIVE
Glucose, UA: NEGATIVE mg/dL
Hgb urine dipstick: NEGATIVE
Ketones, ur: NEGATIVE mg/dL
Leukocytes, UA: NEGATIVE
Nitrite: NEGATIVE
Protein, ur: NEGATIVE mg/dL
Specific Gravity, Urine: 1.021 (ref 1.005–1.030)
Urobilinogen, UA: 1 mg/dL (ref 0.0–1.0)
pH: 6 (ref 5.0–8.0)

## 2014-06-05 LAB — CBC WITH DIFFERENTIAL/PLATELET
Basophils Absolute: 0 10*3/uL (ref 0.0–0.1)
Basophils Relative: 0 % (ref 0–1)
Eosinophils Absolute: 0.1 10*3/uL (ref 0.0–0.7)
Eosinophils Relative: 2 % (ref 0–5)
HCT: 43.4 % (ref 39.0–52.0)
Hemoglobin: 15.1 g/dL (ref 13.0–17.0)
Lymphocytes Relative: 24 % (ref 12–46)
Lymphs Abs: 1.1 10*3/uL (ref 0.7–4.0)
MCH: 30.9 pg (ref 26.0–34.0)
MCHC: 34.8 g/dL (ref 30.0–36.0)
MCV: 88.9 fL (ref 78.0–100.0)
Monocytes Absolute: 0.4 10*3/uL (ref 0.1–1.0)
Monocytes Relative: 9 % (ref 3–12)
Neutro Abs: 2.9 10*3/uL (ref 1.7–7.7)
Neutrophils Relative %: 65 % (ref 43–77)
Platelets: 180 10*3/uL (ref 150–400)
RBC: 4.88 MIL/uL (ref 4.22–5.81)
RDW: 12.8 % (ref 11.5–15.5)
WBC: 4.4 10*3/uL (ref 4.0–10.5)

## 2014-06-05 LAB — COMPREHENSIVE METABOLIC PANEL
ALT: 37 U/L (ref 0–53)
AST: 31 U/L (ref 0–37)
Albumin: 4.3 g/dL (ref 3.5–5.2)
Alkaline Phosphatase: 99 U/L (ref 39–117)
Anion gap: 5 (ref 5–15)
BUN: 20 mg/dL (ref 6–23)
CO2: 29 mmol/L (ref 19–32)
Calcium: 9.4 mg/dL (ref 8.4–10.5)
Chloride: 105 mEq/L (ref 96–112)
Creatinine, Ser: 1.3 mg/dL (ref 0.50–1.35)
GFR calc Af Amer: 66 mL/min — ABNORMAL LOW (ref >90)
GFR calc non Af Amer: 57 mL/min — ABNORMAL LOW (ref >90)
Glucose, Bld: 102 mg/dL — ABNORMAL HIGH (ref 70–99)
Potassium: 4.1 mmol/L (ref 3.5–5.1)
Sodium: 139 mmol/L (ref 135–145)
Total Bilirubin: 0.8 mg/dL (ref 0.3–1.2)
Total Protein: 7 g/dL (ref 6.0–8.3)

## 2014-06-05 LAB — TYPE AND SCREEN
ABO/RH(D): O POS
Antibody Screen: NEGATIVE

## 2014-06-05 LAB — PROTIME-INR
INR: 1.06 (ref 0.00–1.49)
Prothrombin Time: 13.9 seconds (ref 11.6–15.2)

## 2014-06-05 LAB — APTT: aPTT: 31 seconds (ref 24–37)

## 2014-06-05 LAB — SURGICAL PCR SCREEN
MRSA, PCR: NEGATIVE
Staphylococcus aureus: NEGATIVE

## 2014-06-05 NOTE — Pre-Procedure Instructions (Signed)
Patrick Mathews  06/05/2014   Your procedure is scheduled on:  Monday, January 4.  Report to [facility] Admitting at 5:30 AM.  Call this number if you have problems the morning of surgery: [phone]   Remember:   Do not eat food or drink liquids after midnight Sunday, January 3.   Take these medicines the morning of surgery with A SIP OF WATER: RABRABEprazole (ACIPHEX).              Stop taking ibuprofen (ADVIL,MOTRIN) December28.    Do not wear jewelry, make-up or nail polish.  Do not wear lotions, powders, or perfumes.   Men may shave face and neck.  Do not bring valuables to the hospital.             [facility] is not responsible  for any belongings or valuables.               Contacts, dentures or bridgework may not be worn into surgery.  Leave suitcase in the car. After surgery it may be brought to your room.  For patients admitted to the hospital, discharge time is determined by your treatment team.                 Special Instructions: Review  [facility] - Preparing For Surgery.   Please read over the following fact sheets that you were given: Pain Booklet, Coughing and Deep Breathing, Blood Transfusion Information and Surgical Site Infection Prevention and Incentive Spirometry.

## 2014-06-05 NOTE — Pre-Procedure Instructions (Addendum)
Patrick Mathews  06/05/2014   Your procedure is scheduled on:  Monday, January 4.  Report to [facility] Admitting at 5:30 AM.  Call this number if you have problems the morning of surgery: [phone]   Remember:   Do not eat food or drink liquids after midnight Sunday, January 3.   Take these medicines the morning of surgery with A SIP OF WATER: RABRABEprazole (ACIPHEX).              Stop taking ibuprofen (ADVIL,MOTRIN) December28.    Do not wear jewelry, make-up or nail polish.  Do not wear lotions, powders, or perfumes.   Men may shave face and neck.  Do not bring valuables to the hospital.             [facility] is not responsible  for any belongings or valuables.               Contacts, dentures or bridgework may not be worn into surgery.  Leave suitcase in the car. After surgery it may be brought to your room.  For patients admitted to the hospital, discharge time is determined by your treatment team.                 Special Instructions: Review  [facility] - Preparing For Surgery.   Please read over the following fact sheets that you were given: Pain Booklet, Coughing and Deep Breathing, Blood Transfusion Information and Surgical Site Infection Prevention and Incentive Spirometry.

## 2014-06-05 NOTE — Progress Notes (Signed)
[facility] PCP is Dr Sharilyn Sites at [facility].  Patient denies chest pain, shortness of breath, does not see a cardiologist.  EKG shows Bifacicular Block, I spoke with Dr.G Tamala Julian  Instructed me to leave chart for [facility] and leave a note that patient will probably need a cardiology work up.  I requested last office note and EKG.

## 2014-06-07 LAB — URINE CULTURE
Colony Count: NO GROWTH
Culture: NO GROWTH

## 2014-06-09 NOTE — Progress Notes (Signed)
Anesthesia Chart Review:  Patient is a 63 year old male scheduled for left TKA on 06/16/14 by Dr. Noemi Chapel.    History includes smoking (quit cigarettes in 2014, but smokes an occasional cigar), HLD, PNA 11/2013, exertional dyspnea, osteoarthritis, back pain, seasonal allergies, L4-5 fusion '09, C6-7 ACDF '08.  BMI is consistent with mild obesity. PCP is Dr. Sharilyn Sites at [facility] in [place]. He was also recently referred to pulmonologist Dr. Melvyn Novas by Dr. Hilma Favors for evaluation of pulmonary nodules, dyspnea (since PNA), and pre-operative pulmonology evaluation. His 05/07/14  note states, dyspnea with weight gain most likely related to deconditioning related to acute illness and "nothing on exam or by CT chest that would contraindicate clearance for knee surgery at this point."   Meds include Vitamin B12, Aciphex, Lipitor, ibuprofen.  04/23/14 Chest CT with contrast: 1. Small and bilateral pulmonary nodules, up to 5 mm. These are very likely benign given their appearance and location, but follow-up is recommended in this smoker. Follow-up chest CT in 6-12 months recommended. 2. Discrete ground-glass nodule in the left upper lobe, 9 mm. Reportedly there is an outside chest CT performed 4 months ago. 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 Follow-up CT in 12 months recommended with continued annual surveillance for a minimum of 3 years.   Preoperative labs noted.   EKG on 06/05/14: NSR, right BBB, LAFB, Bifascicular block.  The interpreting cardiologist felt there was no significant change when compared to prior tracing on 07/11/07 ([facility]).  Last EKG from his PCP office from 10/08/13 also shows bifascicular block.  Reviewed above with anesthesiologist Dr. Marcie Bal.  Patient has had known bifascicular block since 2008.  His PCP is aware of surgery plans, and patient has pulmonology clearance.  If no acute changes in his  cardiopulmonary status then it is anticipated that he can proceed as planned.  George [facility] Short Stay Center/Anesthesiology Phone [phone] 06/09/2014 4:32 PM

## 2014-06-12 ENCOUNTER — Ambulatory Visit (INDEPENDENT_AMBULATORY_CARE_PROVIDER_SITE_OTHER): Payer: 59 | Admitting: Internal Medicine

## 2014-06-12 ENCOUNTER — Encounter: Payer: Self-pay | Admitting: Internal Medicine

## 2014-06-12 VITALS — BP 148/90 | HR 68 | Temp 97.1°F | Ht 70.0 in | Wt 223.0 lb

## 2014-06-12 DIAGNOSIS — Z720 Tobacco use: Secondary | ICD-10-CM

## 2014-06-12 DIAGNOSIS — J069 Acute upper respiratory infection, unspecified: Secondary | ICD-10-CM

## 2014-06-12 DIAGNOSIS — R059 Cough, unspecified: Secondary | ICD-10-CM | POA: Insufficient documentation

## 2014-06-12 DIAGNOSIS — R05 Cough: Secondary | ICD-10-CM

## 2014-06-12 DIAGNOSIS — R918 Other nonspecific abnormal finding of lung field: Secondary | ICD-10-CM

## 2014-06-12 DIAGNOSIS — F17210 Nicotine dependence, cigarettes, uncomplicated: Secondary | ICD-10-CM | POA: Insufficient documentation

## 2014-06-12 MED ORDER — AMOXICILLIN-POT CLAVULANATE 875-125 MG PO TABS: 1.0000 | ORAL_TABLET | Freq: Two times a day (BID) | ORAL | Status: DC

## 2014-06-12 MED ORDER — FAMOTIDINE 20 MG PO TABS: ORAL_TABLET | ORAL | Status: DC

## 2014-06-12 MED ORDER — METHYLPREDNISOLONE ACETATE 80 MG/ML IJ SUSP
120.0000 mg | Freq: Once | INTRAMUSCULAR | Status: AC
  Administered 2014-06-12: 120 mg via INTRAMUSCULAR

## 2014-06-12 NOTE — Progress Notes (Signed)
Subjective:     Patient ID: Patrick Mathews, male   DOB: 08-21-1950   MRN: [id number]   Brief patient profile:  63 yowm quit smoking Jan 2015  Able to do HS sports s inhalers then started using inhalers prn in the 1980s no clear pattern and no need for inhalers x 2005  then pna dx around spring 2015 with pna assoc with  fever, aches, cough, eval by Dr Hilma Favors first with cxr ? RLL infiltrate then  CT showing mpns  and pt referred to pulmonary clinic 05/07/2014 by Dr Hilma Favors for f/u nodules and need to be cleared for L TKR.   History of Present Illness  05/07/2014 1st [facility] office visit/ [facility]   Chief Complaint  Patient presents with  . Pulmonary Consult    Referred by Dr. Sharilyn Sites for eval of pulmonary nodules. Pt has noticed occ DOE for the past 6 months- gets SOB when playing golf.    new doe since pna x 6 months has not tried any saba. Only sob p 18 holes of golf and really more limited by knee than sob. rec Go ahead with knee surgery and f/u in Feb with pfts/CT    06/12/2014 acute  ov/[facility] re:   cough  Chief Complaint  Patient presents with  . Acute Visit    pt c/o cough, nasal congestion, runny nose, stuffy nose, hoarseness.   Acutely ill 12/28 green mucus worse early in am and also blowing it out of nose and hoarse hacking cough but not sob  No obvious day to day or daytime variabilty or assoc cp or chest tightness, subjective wheeze overt sinus or hb symptoms. No unusual exp hx or h/o childhood pna/ asthma or knowledge of premature birth.  Sleeping ok without nocturnal  or early am exacerbation  of respiratory  c/o's or need for noct saba. Also denies any obvious fluctuation of symptoms with weather or environmental changes or other aggravating or alleviating factors except as outlined above   Current Medications, Allergies, Complete Past Medical History, Past Surgical History, Family History, and Social History were reviewed in [employer]  record.  ROS  The following are not active complaints unless bolded sore throat, dysphagia, dental problems, itching, sneezing,  nasal congestion or excess/ purulent secretions, ear ache,   fever, chills, sweats, unintended wt loss, pleuritic or exertional cp, hemoptysis,  orthopnea pnd or leg swelling, presyncope, palpitations, heartburn, abdominal pain, anorexia, nausea, vomiting, diarrhea  or change in bowel or urinary habits, change in stools or urine, dysuria,hematuria,  rash, arthralgias, visual complaints, headache, numbness weakness or ataxia or problems with walking or coordination,  change in mood/affect or memory.                  Objective:   Physical Exam    amb pleasant wm nad  Wt Readings from Last 3 Encounters:  06/12/14 223 lb (101.152 kg)  06/05/14 218 lb 1 oz (98.913 kg)  06/04/14 220 lb (99.791 kg)    Vital signs reviewed      HEENT: nl dentition, turbinates, and orophanx. Nl external ear canals without cough reflex   NECK :  without JVD/Nodes/TM/ nl carotid upstrokes bilaterally   LUNGS: no acc muscle use, clear to A and P bilaterally without cough on insp or exp maneuvers   CV:  RRR  no s3 or murmur or increase in P2, no edema   ABD:  soft and nontender with nl excursion in the supine  position. No bruits or organomegaly, bowel sounds nl  MS:  warm without deformities, calf tenderness, cyanosis or clubbing  SKIN: warm and dry without lesions    NEURO:  alert, approp, no deficits     Assessment:

## 2014-06-12 NOTE — Assessment & Plan Note (Signed)
Likely uri and unrelated to previous CT findings

## 2014-06-12 NOTE — Assessment & Plan Note (Signed)
Strongly advised to quit smoking for 2 weeks preop if at all possible but note even with active bronchtis he has no wheezing so doubt he has sign ab or copd

## 2014-06-12 NOTE — Assessment & Plan Note (Signed)
-   CT chest [facility] 11/13/13  GG nodule 6 mm LUL and MPNs rul x 4 mm - ReCT 04/23/14 LUL x 9 mm GG changes   Reviewed need to quit smoking with pt and f/u as planned in Feb with CT and pfts but we can put this off for a month if needed since he has to postpone his knee surgery

## 2014-06-12 NOTE — Patient Instructions (Signed)
depomedrol 120 mg IM today  Augmentin 875 mg take one pill twice daily  X 10 days - take at breakfast and supper with large glass of water.  It would help reduce the usual side effects (diarrhea and yeast infections) if you ate cultured yogurt at lunch.   Add pepcid 20 mg at bedtime until surgery

## 2014-06-12 NOTE — Assessment & Plan Note (Signed)
Probably started with viral uri but now purulent sputum c/w rhintis/sinusitis/ tracheobronchit in active smoker who needs to be infection free to be cleared for TKR  rec Augmentin 875 mg take one pill twice daily  X 10 days  Notified Dr Para March ok for surgery in 2 weeks

## 2014-06-16 ENCOUNTER — Telehealth: Payer: Self-pay | Admitting: *Deleted

## 2014-06-16 NOTE — Telephone Encounter (Signed)
-----   Message from Tanda Rockers, MD sent at 06/12/2014  8:09 PM EST ----- Let pt know I cleared him with Dr Para March for surgery in 2 weeks but absolutely needs to quit smoking completely asap preop

## 2014-06-16 NOTE — Telephone Encounter (Signed)
Pt is aware that he has been cleared.

## 2014-06-16 NOTE — Telephone Encounter (Signed)
LMTCB

## 2014-07-02 NOTE — H&P (Signed)
TOTAL KNEE ADMISSION H&P  Patient is being admitted for left total knee arthroplasty.  Subjective:  Chief Complaint:left knee pain.  HPI: Patrick Mathews, 64 y.o. male, has a history of pain and functional disability in the left knee due to arthritis and has failed non-surgical conservative treatments for greater than 12 weeks to includeNSAID's and/or analgesics, corticosteriod injections, viscosupplementation injections, flexibility and strengthening excercises, supervised PT with diminished ADL's post treatment, use of assistive devices, weight reduction as appropriate and activity modification.  Onset of symptoms was gradual, starting 10 years ago with gradually worsening course since that time. The patient noted prior procedures on the knee to include  arthroscopy and menisectomy on the left knee(s).  Patient currently rates pain in the left knee(s) at 10 out of 10 with activity. Patient has night pain, worsening of pain with activity and weight bearing, pain that interferes with activities of daily living, crepitus and joint swelling.  Patient has evidence of subchondral sclerosis, periarticular osteophytes and joint space narrowing by imaging studies.  There is no active infection.  Patient Active Problem List   Diagnosis Date Noted  . Acute upper respiratory infection 06/12/2014  . Cigarette smoker 06/12/2014  . Cough 06/12/2014  . Primary localized osteoarthritis of left knee   . Hyperlipidemia   . GERD (gastroesophageal reflux disease)   . Back pain   . Dyspnea 05/07/2014  . Multiple pulmonary nodules 05/07/2014   Past Medical History  Diagnosis Date  . Hyperlipidemia   . GERD (gastroesophageal reflux disease)   . Primary localized osteoarthritis of left knee   . Back pain   . Pneumonia 11/2013  . Shortness of breath dyspnea     with exertion  . Seasonal allergies     Past Surgical History  Procedure Laterality Date  . Knee arthroscopy Bilateral   . Bunionectomy Right    . Colonoscopy N/A 11/06/2012    Procedure: COLONOSCOPY;  Surgeon: Jamesetta So, MD;  Location: [facility];  Service: Gastroenterology;  Laterality: N/A;  . Cervical fusion    . Back surgery      x 2Dr Joya Salm    No current facility-administered medications for this encounter.  Current outpatient prescriptions:  .  amoxicillin-clavulanate (AUGMENTIN) 875-125 MG per tablet, Take 1 tablet by mouth 2 (two) times daily., Disp: 20 tablet, Rfl: 0 .  atorvastatin (LIPITOR) 20 MG tablet, Take 20 mg by mouth daily., Disp: , Rfl:  .  Cyanocobalamin (VITAMIN B-12 PO), Take 1 tablet by mouth daily., Disp: , Rfl:  .  famotidine (PEPCID) 20 MG tablet, One at bedtime, Disp: 30 tablet, Rfl: 2 .  ibuprofen (ADVIL,MOTRIN) 200 MG tablet, Take 400 mg by mouth every 6 (six) hours as needed for pain., Disp: , Rfl:  .  RABEprazole (ACIPHEX) 20 MG tablet, Take 20 mg by mouth daily., Disp: , Rfl:  Allergies  Allergen Reactions  . Morphine And Related Nausea And Vomiting  . Sulfa Antibiotics Hives    History  Substance Use Topics  . Smoking status: Current Some Day Smoker -- 40 years    Types: Cigars    Last Attempt to Quit: 06/13/2012  . Smokeless tobacco: Never Used     Comment: smoked cigs on and off x 40 yrs- none since 2014, but does smoke occ cigar- Nov 2015  . Alcohol Use: No    Family History  Problem Relation Age of Onset  . Colon cancer Neg Hx   . Colon polyps Neg Hx  Review of Systems  Constitutional: Negative.   HENT: Negative.   Eyes: Negative.   Respiratory: Negative.   Cardiovascular: Negative.   Gastrointestinal: Negative.   Genitourinary: Negative.   Musculoskeletal: Positive for back pain and joint pain.  Skin: Negative.   Neurological: Negative.   Endo/Heme/Allergies: Negative.   Psychiatric/Behavioral: Negative.     Objective:  Physical Exam  Constitutional: He is oriented to person, place, and time. He appears well-developed and well-nourished.  HENT:  Head:  Normocephalic and atraumatic.  Mouth/Throat: Oropharynx is clear and moist.  Eyes: EOM are normal. Pupils are equal, round, and reactive to light.  Cardiovascular: Normal rate, regular rhythm and normal heart sounds.   Respiratory: Effort normal and breath sounds normal. No respiratory distress. He has no wheezes. He has no rales. He exhibits no tenderness.  GI: Soft. Bowel sounds are normal.  Genitourinary:  Not pertinent to current symptomatology therefore not examined.  Musculoskeletal:  Evaluation of his left knee shows active range of motion -5 to 115 degrees.  2+ crepitus.  1+ synovitis.  Varus deformity.  Distal neurovascular exam is intact.  Right knee has 1+ crepitus.  1+ synovitis.  He is ligamentously stable.  He is neurovascularly intact.  2+ dorsalis pedis pulses.    Neurological: He is alert and oriented to person, place, and time.  Skin: Skin is warm and dry.  Psychiatric: He has a normal mood and affect. His behavior is normal.    Vital signs in last 24 hours: Temp:  [97.5 F (36.4 C)] 97.5 F (36.4 C) (01/20 1300) Pulse Rate:  [54] 54 (01/20 1300) BP: (146)/(82) 146/82 mmHg (01/20 1300) SpO2:  [100 %] 100 % (01/20 1300) Weight:  [99.338 kg (219 lb)] 99.338 kg (219 lb) (01/20 1300)  Labs:   Estimated body mass index is 31.42 kg/(m^2) as calculated from the following:   Height as of this encounter: 5\' 10"  (1.778 m).   Weight as of this encounter: 99.338 kg (219 lb).   Imaging Review Plain radiographs demonstrate severe degenerative joint disease of the left knee(s). The overall alignment issignificant varus. The bone quality appears to be good for age and reported activity level.  Assessment/Plan:  End stage arthritis, left knee   The patient history, physical examination, clinical judgment of the provider and imaging studies are consistent with end stage degenerative joint disease of the left knee(s) and total knee arthroplasty is deemed medically necessary. The  treatment options including medical management, injection therapy arthroscopy and arthroplasty were discussed at length. The risks and benefits of total knee arthroplasty were presented and reviewed. The risks due to aseptic loosening, infection, stiffness, patella tracking problems, thromboembolic complications and other imponderables were discussed. The patient acknowledged the explanation, agreed to proceed with the plan and consent was signed. Patient is being admitted for inpatient treatment for surgery, pain control, PT, OT, prophylactic antibiotics, VTE prophylaxis, progressive ambulation and ADL's and discharge planning. The patient is planning to be discharged home with home health services   Daril Warga A. Kaleen Mask Physician Assistant [facility] [phone]  07/02/2014, 2:42 PM

## 2014-07-03 ENCOUNTER — Other Ambulatory Visit (HOSPITAL_COMMUNITY): Payer: Self-pay | Admitting: *Deleted

## 2014-07-03 ENCOUNTER — Encounter (HOSPITAL_COMMUNITY)
Admission: RE | Admit: 2014-07-03 | Discharge: 2014-07-03 | Disposition: A | Discharge status: Home / Self Care | Payer: 59 | Source: Ambulatory Visit | Attending: Physician Assistant | Admitting: Physician Assistant

## 2014-07-03 ENCOUNTER — Encounter (HOSPITAL_COMMUNITY): Payer: Self-pay

## 2014-07-03 ENCOUNTER — Encounter (HOSPITAL_COMMUNITY)
Admission: RE | Admit: 2014-07-03 | Discharge: 2014-07-03 | Disposition: A | Discharge status: Home / Self Care | Payer: 59 | Source: Ambulatory Visit | Attending: Orthopedic Surgery | Admitting: Orthopedic Surgery

## 2014-07-03 DIAGNOSIS — J069 Acute upper respiratory infection, unspecified: Secondary | ICD-10-CM | POA: Diagnosis not present

## 2014-07-03 DIAGNOSIS — Z01818 Encounter for other preprocedural examination: Secondary | ICD-10-CM | POA: Diagnosis not present

## 2014-07-03 LAB — URINALYSIS, ROUTINE W REFLEX MICROSCOPIC
Bilirubin Urine: NEGATIVE
Glucose, UA: NEGATIVE mg/dL
Hgb urine dipstick: NEGATIVE
Ketones, ur: NEGATIVE mg/dL
Leukocytes, UA: NEGATIVE
Nitrite: NEGATIVE
Protein, ur: NEGATIVE mg/dL
Specific Gravity, Urine: 1.015 (ref 1.005–1.030)
Urobilinogen, UA: 0.2 mg/dL (ref 0.0–1.0)
pH: 6 (ref 5.0–8.0)

## 2014-07-03 LAB — COMPREHENSIVE METABOLIC PANEL
ALT: 37 U/L (ref 0–53)
AST: 25 U/L (ref 0–37)
Albumin: 4 g/dL (ref 3.5–5.2)
Alkaline Phosphatase: 114 U/L (ref 39–117)
Anion gap: 9 (ref 5–15)
BUN: 15 mg/dL (ref 6–23)
CO2: 25 mmol/L (ref 19–32)
Calcium: 9.2 mg/dL (ref 8.4–10.5)
Chloride: 104 mEq/L (ref 96–112)
Creatinine, Ser: 1.22 mg/dL (ref 0.50–1.35)
GFR calc Af Amer: 71 mL/min — ABNORMAL LOW (ref >90)
GFR calc non Af Amer: 61 mL/min — ABNORMAL LOW (ref >90)
Glucose, Bld: 104 mg/dL — ABNORMAL HIGH (ref 70–99)
Potassium: 3.9 mmol/L (ref 3.5–5.1)
Sodium: 138 mmol/L (ref 135–145)
Total Bilirubin: 0.8 mg/dL (ref 0.3–1.2)
Total Protein: 7.2 g/dL (ref 6.0–8.3)

## 2014-07-03 LAB — TYPE AND SCREEN
ABO/RH(D): O POS
Antibody Screen: NEGATIVE

## 2014-07-03 LAB — CBC WITH DIFFERENTIAL/PLATELET
Basophils Absolute: 0 10*3/uL (ref 0.0–0.1)
Basophils Relative: 0 % (ref 0–1)
Eosinophils Absolute: 0.1 10*3/uL (ref 0.0–0.7)
Eosinophils Relative: 2 % (ref 0–5)
HCT: 45.5 % (ref 39.0–52.0)
Hemoglobin: 16 g/dL (ref 13.0–17.0)
Lymphocytes Relative: 24 % (ref 12–46)
Lymphs Abs: 1.2 10*3/uL (ref 0.7–4.0)
MCH: 31.3 pg (ref 26.0–34.0)
MCHC: 35.2 g/dL (ref 30.0–36.0)
MCV: 89 fL (ref 78.0–100.0)
Monocytes Absolute: 0.4 10*3/uL (ref 0.1–1.0)
Monocytes Relative: 8 % (ref 3–12)
Neutro Abs: 3.3 10*3/uL (ref 1.7–7.7)
Neutrophils Relative %: 66 % (ref 43–77)
Platelets: 194 10*3/uL (ref 150–400)
RBC: 5.11 MIL/uL (ref 4.22–5.81)
RDW: 12.8 % (ref 11.5–15.5)
WBC: 5 10*3/uL (ref 4.0–10.5)

## 2014-07-03 LAB — APTT: aPTT: 29 seconds (ref 24–37)

## 2014-07-03 LAB — PROTIME-INR
INR: 0.93 (ref 0.00–1.49)
Prothrombin Time: 12.6 seconds (ref 11.6–15.2)

## 2014-07-03 LAB — SURGICAL PCR SCREEN
MRSA, PCR: NEGATIVE
Staphylococcus aureus: NEGATIVE

## 2014-07-03 NOTE — Pre-Procedure Instructions (Signed)
Patrick Mathews  07/03/2014   Your procedure is scheduled on:  Monday, July 14, 2014 at 7:15 AM.   Report to [facility] Entrance "A" Admitting Office at 5:30 AM.   Call this number if you have problems the morning of surgery: [phone]               Any questions prior to day of surgery, please call [phone] between 8 & 4 PM.   Remember:   Do not eat food or drink liquids after midnight Sunday, 07/13/14.   Take these medicines the morning of surgery with A SIP OF WATER: Aciphex  Stop Vitamins and NSAIDS (Ibuprofen, Aleve, etc.) 7 days prior to surgery.   Do not wear jewelry.  Do not wear lotions, powders, or cologne. You may wear deodorant.  Men may shave face and neck.  Do not bring valuables to the hospital.  [facility] is not responsible                  for any belongings or valuables.               Contacts, dentures or bridgework may not be worn into surgery.  Leave suitcase in the car. After surgery it may be brought to your room.  For patients admitted to the hospital, discharge time is determined by your                treatment team.              Special Instructions: [facility] - Preparing for Surgery  Before surgery, you can play an important role.  Because skin is not sterile, your skin needs to be as free of germs as possible.  You can reduce the number of germs on you skin by washing with CHG (chlorahexidine gluconate) soap before surgery.  CHG is an antiseptic cleaner which kills germs and bonds with the skin to continue killing germs even after washing.  Please DO NOT use if you have an allergy to CHG or antibacterial soaps.  If your skin becomes reddened/irritated stop using the CHG and inform your nurse when you arrive at Short Stay.  Do not shave (including legs and underarms) for at least 48 hours prior to the first CHG shower.  You may shave your face.  Please follow these instructions carefully:   1.  Shower with CHG Soap the night  before surgery and the                                morning of Surgery.  2.  If you choose to wash your hair, wash your hair first as usual with your       normal shampoo.  3.  After you shampoo, rinse your hair and body thoroughly to remove the                      Shampoo.  4.  Use CHG as you would any other liquid soap.  You can apply chg directly       to the skin and wash gently with scrungie or a clean washcloth.  5.  Apply the CHG Soap to your body ONLY FROM THE NECK DOWN.        Do not use on open wounds or open sores.  Avoid contact with your eyes, ears, mouth and genitals (private parts).  Wash genitals (private  parts) with your normal soap.  6.  Wash thoroughly, paying special attention to the area where your surgery        will be performed.  7.  Thoroughly rinse your body with warm water from the neck down.  8.  DO NOT shower/wash with your normal soap after using and rinsing off       the CHG Soap.  9.  Pat yourself dry with a clean towel.            10.  Wear clean pajamas.            11.  Place clean sheets on your bed the night of your first shower and do not        sleep with pets.  Day of Surgery  Do not apply any lotions the morning of surgery.  Please wear clean clothes to the hospital.     Please read over the following fact sheets that you were given: Pain Booklet, Coughing and Deep Breathing, Blood Transfusion Information, MRSA Information and Surgical Site Infection Prevention

## 2014-07-04 LAB — URINE CULTURE
Colony Count: NO GROWTH
Culture: NO GROWTH

## 2014-07-13 MED ORDER — CEFAZOLIN SODIUM-DEXTROSE 2-3 GM-% IV SOLR
2.0000 g | INTRAVENOUS | Status: AC
  Administered 2014-07-14: 2 g via INTRAVENOUS
  Filled 2014-07-13: qty 50

## 2014-07-13 NOTE — Anesthesia Preprocedure Evaluation (Addendum)
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   Neuro/Psych    GI/Hepatic Neg liver ROS, GERD-  Medicated,  Endo/Other    Renal/GU Renal InsufficiencyRenal diseaseGFR 62     Musculoskeletal   Abdominal (+) + obese,   Peds  Hematology 16/45 H/H   Anesthesia Other Findings   Reproductive/Obstetrics                            Anesthesia Physical Anesthesia Plan  ASA: III  Anesthesia Plan: General   Post-op Pain Management: MAC Combined w/ Regional for Post-op pain   Induction: Intravenous  Airway Management Planned: Oral ETT  Additional Equipment:   Intra-op Plan:   Post-operative Plan: Extubation in OR  Informed Consent: I have reviewed the patients History and Physical, chart, labs and discussed the procedure including the risks, benefits and alternatives for the proposed anesthesia with the patient or authorized representative who has indicated his/her understanding and acceptance.     Plan Discussed with:   Anesthesia Plan Comments:         Anesthesia Quick Evaluation

## 2014-07-14 ENCOUNTER — Inpatient Hospital Stay (HOSPITAL_COMMUNITY): Payer: 59 | Admitting: Vascular Surgery

## 2014-07-14 ENCOUNTER — Inpatient Hospital Stay (HOSPITAL_COMMUNITY): Payer: 59 | Admitting: Anesthesiology

## 2014-07-14 ENCOUNTER — Encounter (HOSPITAL_COMMUNITY): Payer: Self-pay | Admitting: *Deleted

## 2014-07-14 ENCOUNTER — Encounter (HOSPITAL_COMMUNITY)
Admission: RE | Disposition: A | Discharge status: Home / Self Care | Payer: Self-pay | Source: Ambulatory Visit | Attending: Orthopedic Surgery

## 2014-07-14 ENCOUNTER — Inpatient Hospital Stay (HOSPITAL_COMMUNITY)
Admission: RE | Admit: 2014-07-14 | Discharge: 2014-07-15 | DRG: 470 | Disposition: A | Discharge status: Home / Self Care | Payer: 59 | Source: Ambulatory Visit | Attending: Orthopedic Surgery | Admitting: Orthopedic Surgery

## 2014-07-14 DIAGNOSIS — M1710 Unilateral primary osteoarthritis, unspecified knee: Secondary | ICD-10-CM | POA: Diagnosis present

## 2014-07-14 DIAGNOSIS — M1712 Unilateral primary osteoarthritis, left knee: Secondary | ICD-10-CM | POA: Diagnosis present

## 2014-07-14 DIAGNOSIS — R918 Other nonspecific abnormal finding of lung field: Secondary | ICD-10-CM | POA: Diagnosis present

## 2014-07-14 DIAGNOSIS — R911 Solitary pulmonary nodule: Secondary | ICD-10-CM | POA: Diagnosis present

## 2014-07-14 DIAGNOSIS — M549 Dorsalgia, unspecified: Secondary | ICD-10-CM | POA: Diagnosis present

## 2014-07-14 DIAGNOSIS — K219 Gastro-esophageal reflux disease without esophagitis: Secondary | ICD-10-CM | POA: Diagnosis present

## 2014-07-14 DIAGNOSIS — F17210 Nicotine dependence, cigarettes, uncomplicated: Secondary | ICD-10-CM | POA: Diagnosis present

## 2014-07-14 DIAGNOSIS — Z886 Allergy status to analgesic agent status: Secondary | ICD-10-CM | POA: Diagnosis not present

## 2014-07-14 DIAGNOSIS — M25562 Pain in left knee: Secondary | ICD-10-CM | POA: Diagnosis present

## 2014-07-14 DIAGNOSIS — E785 Hyperlipidemia, unspecified: Secondary | ICD-10-CM | POA: Diagnosis present

## 2014-07-14 DIAGNOSIS — M179 Osteoarthritis of knee, unspecified: Secondary | ICD-10-CM | POA: Diagnosis present

## 2014-07-14 HISTORY — DX: Unilateral primary osteoarthritis, left knee: M17.12

## 2014-07-14 HISTORY — PX: TOTAL KNEE ARTHROPLASTY: SHX125

## 2014-07-14 HISTORY — DX: Unspecified osteoarthritis, unspecified site: M19.90

## 2014-07-14 HISTORY — DX: Obstructive sleep apnea (adult) (pediatric): G47.33

## 2014-07-14 HISTORY — DX: Unspecified asthma, uncomplicated: J45.909

## 2014-07-14 HISTORY — DX: Dyspnea, unspecified: R06.00

## 2014-07-14 HISTORY — DX: Dependence on other enabling machines and devices: Z99.89

## 2014-07-14 HISTORY — DX: Other forms of dyspnea: R06.09

## 2014-07-14 SURGERY — ARTHROPLASTY, KNEE, TOTAL
Anesthesia: General | Site: Knee | Laterality: Left

## 2014-07-14 MED ORDER — CHLORHEXIDINE GLUCONATE 4 % EX LIQD
60.0000 mL | Freq: Once | CUTANEOUS | Status: DC
  Filled 2014-07-14: qty 60

## 2014-07-14 MED ORDER — ACETAMINOPHEN 10 MG/ML IV SOLN
INTRAVENOUS | Status: DC | PRN
  Administered 2014-07-14: 1000 mg via INTRAVENOUS

## 2014-07-14 MED ORDER — ONDANSETRON HCL 4 MG/2ML IJ SOLN
INTRAMUSCULAR | Status: DC | PRN
  Administered 2014-07-14: 4 mg via INTRAVENOUS

## 2014-07-14 MED ORDER — MEPERIDINE HCL 25 MG/ML IJ SOLN: 6.2500 mg | INTRAMUSCULAR | Status: DC | PRN

## 2014-07-14 MED ORDER — ALBUTEROL SULFATE HFA 108 (90 BASE) MCG/ACT IN AERS
INHALATION_SPRAY | RESPIRATORY_TRACT | Status: DC | PRN
  Administered 2014-07-14: 4 via RESPIRATORY_TRACT

## 2014-07-14 MED ORDER — DEXAMETHASONE SODIUM PHOSPHATE 10 MG/ML IJ SOLN
10.0000 mg | Freq: Three times a day (TID) | INTRAMUSCULAR | Status: DC
  Administered 2014-07-14 – 2014-07-15 (×2): 10 mg via INTRAVENOUS
  Filled 2014-07-14 (×6): qty 1

## 2014-07-14 MED ORDER — ROCURONIUM BROMIDE 50 MG/5ML IV SOLN
INTRAVENOUS | Status: AC
  Filled 2014-07-14: qty 1

## 2014-07-14 MED ORDER — PROPOFOL 10 MG/ML IV BOLUS
INTRAVENOUS | Status: AC
  Filled 2014-07-14: qty 20

## 2014-07-14 MED ORDER — ALUM & MAG HYDROXIDE-SIMETH 200-200-20 MG/5ML PO SUSP: 30.0000 mL | ORAL | Status: DC | PRN

## 2014-07-14 MED ORDER — CEFAZOLIN SODIUM-DEXTROSE 2-3 GM-% IV SOLR
2.0000 g | Freq: Four times a day (QID) | INTRAVENOUS | Status: AC
  Administered 2014-07-14 (×2): 2 g via INTRAVENOUS
  Filled 2014-07-14 (×2): qty 50

## 2014-07-14 MED ORDER — ACETAMINOPHEN 325 MG PO TABS: 650.0000 mg | ORAL_TABLET | Freq: Four times a day (QID) | ORAL | Status: DC | PRN

## 2014-07-14 MED ORDER — FENTANYL CITRATE 0.05 MG/ML IJ SOLN
INTRAMUSCULAR | Status: DC | PRN
  Administered 2014-07-14: 50 ug via INTRAVENOUS
  Administered 2014-07-14 (×6): 25 ug via INTRAVENOUS
  Administered 2014-07-14: 50 ug via INTRAVENOUS

## 2014-07-14 MED ORDER — DIPHENHYDRAMINE HCL 12.5 MG/5ML PO ELIX: 12.5000 mg | ORAL_SOLUTION | ORAL | Status: DC | PRN

## 2014-07-14 MED ORDER — FENTANYL CITRATE 0.05 MG/ML IJ SOLN
25.0000 ug | INTRAMUSCULAR | Status: DC | PRN
  Administered 2014-07-14 (×2): 50 ug via INTRAVENOUS

## 2014-07-14 MED ORDER — FENTANYL CITRATE 0.05 MG/ML IJ SOLN
INTRAMUSCULAR | Status: AC
  Filled 2014-07-14: qty 2

## 2014-07-14 MED ORDER — POVIDONE-IODINE 7.5 % EX SOLN
Freq: Once | CUTANEOUS | Status: DC
  Filled 2014-07-14: qty 118

## 2014-07-14 MED ORDER — LIDOCAINE HCL (CARDIAC) 20 MG/ML IV SOLN
INTRAVENOUS | Status: AC
  Filled 2014-07-14: qty 5

## 2014-07-14 MED ORDER — PROMETHAZINE HCL 25 MG/ML IJ SOLN: 6.2500 mg | INTRAMUSCULAR | Status: DC | PRN

## 2014-07-14 MED ORDER — OXYCODONE HCL 5 MG PO TABS
5.0000 mg | ORAL_TABLET | ORAL | Status: DC | PRN
  Administered 2014-07-14 – 2014-07-15 (×2): 10 mg via ORAL
  Filled 2014-07-14 (×2): qty 2

## 2014-07-14 MED ORDER — SODIUM CHLORIDE 0.9 % IR SOLN
Status: DC | PRN
  Administered 2014-07-14: 1000 mL
  Administered 2014-07-14: 3000 mL

## 2014-07-14 MED ORDER — LIDOCAINE HCL (CARDIAC) 20 MG/ML IV SOLN
INTRAVENOUS | Status: DC | PRN
  Administered 2014-07-14: 100 mg via INTRAVENOUS

## 2014-07-14 MED ORDER — ONDANSETRON HCL 4 MG/2ML IJ SOLN
INTRAMUSCULAR | Status: AC
  Filled 2014-07-14: qty 2

## 2014-07-14 MED ORDER — POTASSIUM CHLORIDE IN NACL 20-0.9 MEQ/L-% IV SOLN
INTRAVENOUS | Status: DC
  Administered 2014-07-14: 16:00:00 via INTRAVENOUS
  Filled 2014-07-14 (×4): qty 1000

## 2014-07-14 MED ORDER — PANTOPRAZOLE SODIUM 40 MG PO TBEC: 40.0000 mg | DELAYED_RELEASE_TABLET | Freq: Every day | ORAL | Status: DC

## 2014-07-14 MED ORDER — FENTANYL CITRATE 0.05 MG/ML IJ SOLN
INTRAMUSCULAR | Status: AC
  Filled 2014-07-14: qty 5

## 2014-07-14 MED ORDER — INFLUENZA VAC SPLIT QUAD 0.5 ML IM SUSY
0.5000 mL | PREFILLED_SYRINGE | INTRAMUSCULAR | Status: AC
  Administered 2014-07-15: 0.5 mL via INTRAMUSCULAR
  Filled 2014-07-14: qty 0.5

## 2014-07-14 MED ORDER — DEXAMETHASONE SODIUM PHOSPHATE 10 MG/ML IJ SOLN
INTRAMUSCULAR | Status: DC | PRN
  Administered 2014-07-14: 10 mg via INTRAVENOUS

## 2014-07-14 MED ORDER — METOCLOPRAMIDE HCL 10 MG PO TABS: 5.0000 mg | ORAL_TABLET | Freq: Three times a day (TID) | ORAL | Status: DC | PRN

## 2014-07-14 MED ORDER — PROPOFOL 10 MG/ML IV BOLUS
INTRAVENOUS | Status: DC | PRN
  Administered 2014-07-14: 150 mg via INTRAVENOUS

## 2014-07-14 MED ORDER — KETOROLAC TROMETHAMINE 15 MG/ML IJ SOLN
INTRAMUSCULAR | Status: AC
  Filled 2014-07-14: qty 1

## 2014-07-14 MED ORDER — SUCCINYLCHOLINE CHLORIDE 20 MG/ML IJ SOLN
INTRAMUSCULAR | Status: AC
  Filled 2014-07-14: qty 1

## 2014-07-14 MED ORDER — METOCLOPRAMIDE HCL 5 MG/ML IJ SOLN: 5.0000 mg | Freq: Three times a day (TID) | INTRAMUSCULAR | Status: DC | PRN

## 2014-07-14 MED ORDER — BUPIVACAINE-EPINEPHRINE 0.25% -1:200000 IJ SOLN
INTRAMUSCULAR | Status: DC | PRN
  Administered 2014-07-14: 30 mL

## 2014-07-14 MED ORDER — LACTATED RINGERS IV SOLN: INTRAVENOUS | Status: DC

## 2014-07-14 MED ORDER — PHENOL 1.4 % MT LIQD: 1.0000 | OROMUCOSAL | Status: DC | PRN

## 2014-07-14 MED ORDER — DEXAMETHASONE SODIUM PHOSPHATE 10 MG/ML IJ SOLN
INTRAMUSCULAR | Status: AC
  Filled 2014-07-14: qty 1

## 2014-07-14 MED ORDER — ACETAMINOPHEN 650 MG RE SUPP: 650.0000 mg | Freq: Four times a day (QID) | RECTAL | Status: DC | PRN

## 2014-07-14 MED ORDER — ONDANSETRON HCL 4 MG PO TABS: 4.0000 mg | ORAL_TABLET | Freq: Four times a day (QID) | ORAL | Status: DC | PRN

## 2014-07-14 MED ORDER — MENTHOL 3 MG MT LOZG: 1.0000 | LOZENGE | OROMUCOSAL | Status: DC | PRN

## 2014-07-14 MED ORDER — POLYETHYLENE GLYCOL 3350 17 G PO PACK
17.0000 g | PACK | Freq: Two times a day (BID) | ORAL | Status: DC
  Administered 2014-07-14 – 2014-07-15 (×3): 17 g via ORAL
  Filled 2014-07-14 (×4): qty 1

## 2014-07-14 MED ORDER — MIDAZOLAM HCL 2 MG/2ML IJ SOLN
INTRAMUSCULAR | Status: AC
  Filled 2014-07-14: qty 2

## 2014-07-14 MED ORDER — KETOROLAC TROMETHAMINE 15 MG/ML IJ SOLN
7.5000 mg | Freq: Four times a day (QID) | INTRAMUSCULAR | Status: DC
  Administered 2014-07-14 – 2014-07-15 (×3): 7.5 mg via INTRAVENOUS
  Filled 2014-07-14 (×2): qty 1

## 2014-07-14 MED ORDER — DM-GUAIFENESIN ER 30-600 MG PO TB12
1.0000 | ORAL_TABLET | Freq: Two times a day (BID) | ORAL | Status: DC | PRN
  Filled 2014-07-14: qty 1

## 2014-07-14 MED ORDER — LACTATED RINGERS IV SOLN
INTRAVENOUS | Status: DC | PRN
  Administered 2014-07-14 (×2): via INTRAVENOUS

## 2014-07-14 MED ORDER — ARTIFICIAL TEARS OP OINT
TOPICAL_OINTMENT | OPHTHALMIC | Status: DC | PRN
  Administered 2014-07-14: 1 via OPHTHALMIC

## 2014-07-14 MED ORDER — SODIUM CHLORIDE 0.9 % IJ SOLN
INTRAMUSCULAR | Status: AC
  Filled 2014-07-14: qty 10

## 2014-07-14 MED ORDER — MIDAZOLAM HCL 5 MG/5ML IJ SOLN
INTRAMUSCULAR | Status: DC | PRN
  Administered 2014-07-14: 2 mg via INTRAVENOUS

## 2014-07-14 MED ORDER — EPHEDRINE SULFATE 50 MG/ML IJ SOLN
INTRAMUSCULAR | Status: AC
  Filled 2014-07-14: qty 1

## 2014-07-14 MED ORDER — RIVAROXABAN 10 MG PO TABS
10.0000 mg | ORAL_TABLET | Freq: Every day | ORAL | Status: DC
  Administered 2014-07-15: 10 mg via ORAL
  Filled 2014-07-14 (×2): qty 1

## 2014-07-14 MED ORDER — ALBUTEROL SULFATE HFA 108 (90 BASE) MCG/ACT IN AERS
INHALATION_SPRAY | RESPIRATORY_TRACT | Status: AC
  Filled 2014-07-14: qty 6.7

## 2014-07-14 MED ORDER — DOCUSATE SODIUM 100 MG PO CAPS
100.0000 mg | ORAL_CAPSULE | Freq: Two times a day (BID) | ORAL | Status: DC
  Administered 2014-07-14 – 2014-07-15 (×3): 100 mg via ORAL
  Filled 2014-07-14 (×4): qty 1

## 2014-07-14 MED ORDER — ONDANSETRON HCL 4 MG/2ML IJ SOLN: 4.0000 mg | Freq: Four times a day (QID) | INTRAMUSCULAR | Status: DC | PRN

## 2014-07-14 MED ORDER — BUPIVACAINE-EPINEPHRINE (PF) 0.5% -1:200000 IJ SOLN
INTRAMUSCULAR | Status: DC | PRN
  Administered 2014-07-14: 30 mL via PERINEURAL

## 2014-07-14 MED ORDER — ACETAMINOPHEN 10 MG/ML IV SOLN
INTRAVENOUS | Status: AC
  Filled 2014-07-14: qty 100

## 2014-07-14 MED ORDER — HYDROMORPHONE HCL 1 MG/ML IJ SOLN
1.0000 mg | INTRAMUSCULAR | Status: DC | PRN
  Administered 2014-07-14 (×2): 1 mg via INTRAVENOUS
  Filled 2014-07-14 (×2): qty 1

## 2014-07-14 MED ORDER — BUPIVACAINE-EPINEPHRINE (PF) 0.25% -1:200000 IJ SOLN
INTRAMUSCULAR | Status: AC
  Filled 2014-07-14: qty 30

## 2014-07-14 MED ORDER — FENTANYL CITRATE 0.05 MG/ML IJ SOLN
INTRAMUSCULAR | Status: AC
  Administered 2014-07-14: 50 ug via INTRAVENOUS
  Filled 2014-07-14: qty 2

## 2014-07-14 SURGICAL SUPPLY — 68 items
BANDAGE ESMARK 6X9 LF (GAUZE/BANDAGES/DRESSINGS) ×1 IMPLANT
BENZOIN TINCTURE PRP APPL 2/3 (GAUZE/BANDAGES/DRESSINGS) ×3 IMPLANT
BLADE SAGITTAL 25.0X1.19X90 (BLADE) ×2 IMPLANT
BLADE SAGITTAL 25.0X1.19X90MM (BLADE) ×1
BLADE SAW SGTL 11.0X1.19X90.0M (BLADE) IMPLANT
BLADE SAW SGTL 13.0X1.19X90.0M (BLADE) ×3 IMPLANT
BLADE SURG 10 STRL SS (BLADE) IMPLANT
BNDG ELASTIC 6X15 VLCR STRL LF (GAUZE/BANDAGES/DRESSINGS) ×3 IMPLANT
BNDG ESMARK 6X9 LF (GAUZE/BANDAGES/DRESSINGS) ×3
BOWL SMART MIX CTS (DISPOSABLE) ×3 IMPLANT
CAP KNEE TOTAL 3 SIGMA ×3 IMPLANT
CEMENT HV SMART SET (Cement) ×6 IMPLANT
CLOSURE WOUND 1/2 X4 (GAUZE/BANDAGES/DRESSINGS) ×1
COVER SURGICAL LIGHT HANDLE (MISCELLANEOUS) ×3 IMPLANT
CUFF TOURNIQUET SINGLE 34IN LL (TOURNIQUET CUFF) ×3 IMPLANT
CUFF TOURNIQUET SINGLE 44IN (TOURNIQUET CUFF) IMPLANT
DRAPE EXTREMITY T 121X128X90 (DRAPE) ×3 IMPLANT
DRAPE IMP U-DRAPE 54X76 (DRAPES) ×3 IMPLANT
DRAPE INCISE IOBAN 66X45 STRL (DRAPES) ×3 IMPLANT
DRAPE PROXIMA HALF (DRAPES) ×3 IMPLANT
DRAPE U-SHAPE 47X51 STRL (DRAPES) ×3 IMPLANT
DRSG AQUACEL AG ADV 3.5X14 (GAUZE/BANDAGES/DRESSINGS) ×3 IMPLANT
DRSG PAD ABDOMINAL 8X10 ST (GAUZE/BANDAGES/DRESSINGS) ×6 IMPLANT
DURAPREP 26ML APPLICATOR (WOUND CARE) ×6 IMPLANT
ELECT CAUTERY BLADE 6.4 (BLADE) ×3 IMPLANT
ELECT REM PT RETURN 9FT ADLT (ELECTROSURGICAL) ×3
ELECTRODE REM PT RTRN 9FT ADLT (ELECTROSURGICAL) ×1 IMPLANT
EVACUATOR 1/8 PVC DRAIN (DRAIN) ×3 IMPLANT
FACESHIELD WRAPAROUND (MASK) ×3 IMPLANT
GAUZE SPONGE 4X4 12PLY STRL (GAUZE/BANDAGES/DRESSINGS) ×3 IMPLANT
GLOVE BIO SURGEON STRL SZ7 (GLOVE) ×3 IMPLANT
GLOVE BIOGEL PI IND STRL 7.0 (GLOVE) ×1 IMPLANT
GLOVE BIOGEL PI IND STRL 7.5 (GLOVE) ×1 IMPLANT
GLOVE BIOGEL PI INDICATOR 7.0 (GLOVE) ×2
GLOVE BIOGEL PI INDICATOR 7.5 (GLOVE) ×2
GLOVE SS BIOGEL STRL SZ 7.5 (GLOVE) ×1 IMPLANT
GLOVE SUPERSENSE BIOGEL SZ 7.5 (GLOVE) ×2
GOWN STRL REUS W/ TWL LRG LVL3 (GOWN DISPOSABLE) ×2 IMPLANT
GOWN STRL REUS W/ TWL XL LVL3 (GOWN DISPOSABLE) ×2 IMPLANT
GOWN STRL REUS W/TWL LRG LVL3 (GOWN DISPOSABLE) ×4
GOWN STRL REUS W/TWL XL LVL3 (GOWN DISPOSABLE) ×4
HANDPIECE INTERPULSE COAX TIP (DISPOSABLE) ×2
HOOD PEEL AWAY FACE SHEILD DIS (HOOD) ×9 IMPLANT
IMMOBILIZER KNEE 22 UNIV (SOFTGOODS) ×3 IMPLANT
KIT BASIN OR (CUSTOM PROCEDURE TRAY) ×3 IMPLANT
KIT ROOM TURNOVER OR (KITS) ×3 IMPLANT
MANIFOLD NEPTUNE II (INSTRUMENTS) ×3 IMPLANT
MARKER SKIN DUAL TIP RULER LAB (MISCELLANEOUS) ×3 IMPLANT
NS IRRIG 1000ML POUR BTL (IV SOLUTION) ×3 IMPLANT
PACK TOTAL JOINT (CUSTOM PROCEDURE TRAY) ×3 IMPLANT
PACK UNIVERSAL I (CUSTOM PROCEDURE TRAY) ×3 IMPLANT
PAD ARMBOARD 7.5X6 YLW CONV (MISCELLANEOUS) ×6 IMPLANT
PADDING CAST COTTON 6X4 STRL (CAST SUPPLIES) ×3 IMPLANT
RUBBERBAND STERILE (MISCELLANEOUS) ×3 IMPLANT
SET HNDPC FAN SPRY TIP SCT (DISPOSABLE) ×1 IMPLANT
STRIP CLOSURE SKIN 1/2X4 (GAUZE/BANDAGES/DRESSINGS) ×2 IMPLANT
SUCTION FRAZIER TIP 10 FR DISP (SUCTIONS) ×3 IMPLANT
SUT ETHIBOND NAB CT1 #1 30IN (SUTURE) ×3 IMPLANT
SUT MNCRL AB 3-0 PS2 18 (SUTURE) ×3 IMPLANT
SUT VIC AB 0 CT1 27 (SUTURE) ×4
SUT VIC AB 0 CT1 27XBRD ANBCTR (SUTURE) ×2 IMPLANT
SUT VIC AB 2-0 CT1 27 (SUTURE) ×4
SUT VIC AB 2-0 CT1 TAPERPNT 27 (SUTURE) ×2 IMPLANT
SYR 30ML SLIP (SYRINGE) ×3 IMPLANT
TOWEL OR 17X24 6PK STRL BLUE (TOWEL DISPOSABLE) ×3 IMPLANT
TOWEL OR 17X26 10 PK STRL BLUE (TOWEL DISPOSABLE) ×3 IMPLANT
TRAY FOLEY CATH 16FR SILVER (SET/KITS/TRAYS/PACK) ×3 IMPLANT
WATER STERILE IRR 1000ML POUR (IV SOLUTION) IMPLANT

## 2014-07-14 NOTE — Anesthesia Procedure Notes (Signed)
Anesthesia Regional Block:  Adductor canal block  Pre-Anesthetic Checklist: ,, timeout performed, Correct Patient, Correct Site, Correct Laterality, Correct Procedure, Correct Position, site marked, Risks and benefits discussed,  Surgical consent,  Pre-op evaluation,  At surgeon's request and post-op pain management  Laterality: Lower and Left  Prep: Maximum Sterile Barrier Precautions used and chloraprep       Needles:  Injection technique: Single-shot  Needle Type: Echogenic Stimulator Needle     Needle Length: 10cm 10 cm Needle Gauge: 21 and 21 G    Additional Needles:  Procedures: ultrasound guided (picture in chart) Adductor canal block Narrative:  Start time: 07/14/2014 7:00 AM End time: 07/14/2014 7:12 AM  Performed by: Personally  Anesthesiologist: Alexis Frock  Additional Notes: L AC block with US guidance, 227ml .5% marcaine with epi, multiple asp, talked to patient throughout, no complications

## 2014-07-14 NOTE — Progress Notes (Signed)
Orthopedic Tech Progress Note Patient Details:  Patrick Mathews 1950/12/31 [id number]  CPM Left Knee CPM Left Knee: On Left Knee Flexion (Degrees): 90 Left Knee Extension (Degrees): 0 Additional Comments: trapeze bar patient helper Viewed order from doctor's order list  Hildred Priest 07/14/2014, 10:08 AM

## 2014-07-14 NOTE — Progress Notes (Signed)
Orthopedic Tech Progress Note Patient Details:  Patrick Mathews 1950-08-06 [id number] On cpm at 7:15 pm  Patient ID: Patrick Mathews, male   DOB: 06-Jul-1950, 64 y.o.   MRN: [id number]   Patrick Mathews 07/14/2014, 7:17 PM

## 2014-07-14 NOTE — Progress Notes (Signed)
Utilization review completed.  

## 2014-07-14 NOTE — Transfer of Care (Signed)
Immediate Anesthesia Transfer of Care Note  Patient: Patrick Mathews  Procedure(s) Performed: Procedure(s): LEFT TOTAL KNEE ARTHROPLASTY (Left)  Patient Location: PACU  Anesthesia Type:General  Level of Consciousness: awake, alert , oriented and patient cooperative  Airway & Oxygen Therapy: Patient Spontanous Breathing and Patient connected to face mask oxygen  Post-op Assessment: Report given to RN, Post -op Vital signs reviewed and stable and Patient moving all extremities X 4  Post vital signs: Reviewed and stable  Last Vitals:  Filed Vitals:   07/14/14 0919  BP:   Pulse: 75  Temp: 36.8 C  Resp: 18    Complications: No apparent anesthesia complications

## 2014-07-14 NOTE — Progress Notes (Signed)
Physical Therapy Evaluation Patient Details Name: Patrick Mathews MRN: [id number] DOB: October 07, 1950 Today's Date: 07/14/2014   History of Present Illness  Patient is a 64 yo male admitted 07/14/14 now s/p Lt TKA.  PMH:  OA, HLD, back pain, DOE, tobacco use, cervical fusion, back surgery x2  Clinical Impression  Patient presents with problems listed below.  Will benefit from acute PT to maximize independence prior to discharge home with wife.    Follow Up Recommendations Home health PT;Supervision/Assistance - 24 hour    Equipment Recommendations  None recommended by PT    Recommendations for Other Services       Precautions / Restrictions Precautions Precautions: Knee Precaution Booklet Issued: Yes (comment) Precaution Comments: Reviewed precautions with patient and wife. Required Braces or Orthoses: Knee Immobilizer - Left Knee Immobilizer - Left: On when out of bed or walking;Discontinue once straight leg raise with < 10 degree lag Restrictions Weight Bearing Restrictions: Yes LLE Weight Bearing: Weight bearing as tolerated      Mobility  Bed Mobility Overal bed mobility: Needs Assistance Bed Mobility: Supine to Sit     Supine to sit: Min guard     General bed mobility comments: Instructed patient and wife on donning KI on LLE.  Verbal cues for bed mobility technique.  Patient able to move to EOB with increased time.  No physical assist needed.  Patient with good sitting balance.  Transfers Overall transfer level: Needs assistance Equipment used: Rolling walker (2 wheeled) Transfers: Sit to/from Stand Sit to Stand: Min assist         General transfer comment: Verbal cues for hand placement and technique.  Assist for balance/safety during transfer.  Ambulation/Gait Ambulation/Gait assistance: Min assist Ambulation Distance (Feet): 30 Feet Assistive device: Rolling walker (2 wheeled) Gait Pattern/deviations: Step-through pattern;Decreased stance time -  left;Decreased step length - right;Decreased stride length;Decreased weight shift to left;Antalgic Gait velocity: Decreased Gait velocity interpretation: Below normal speed for age/gender General Gait Details: Verbal cues for safe use of RW and gait sequence.  Assist for balance/safety.  Patient lightheaded at end of gait.  Cleared once in chair with cold cloth (2-3 minutes).  Stairs            Wheelchair Mobility    Modified Rankin (Stroke Patients Only)       Balance                                             Pertinent Vitals/Pain Pain Assessment: 0-10 Pain Score: 2  Pain Location: Lt knee Pain Descriptors / Indicators: Sore Pain Intervention(s): Monitored during session;Repositioned    Home Living Family/patient expects to be discharged to:: Private residence Living Arrangements: Spouse/significant other Available Help at Discharge: Family;Available 24 hours/day Type of Home: House Home Access: Level entry     Home Layout: Two level;Able to live on main level with bedroom/bathroom Home Equipment: Gilford Rile - 2 wheels;Bedside commode      Prior Function Level of Independence: Independent         Comments: Very active.  Plays golf.  Retired     Journalist, newspaper        Extremity/Trunk Assessment   Upper Extremity Assessment: Overall WFL for tasks assessed           Lower Extremity Assessment: LLE deficits/detail   LLE Deficits / Details: Decreased strength and ROM post-op.  Patient  able to move LLE against gravity  Cervical / Trunk Assessment: Normal  Communication   Communication: No difficulties  Cognition Arousal/Alertness: Awake/alert Behavior During Therapy: WFL for tasks assessed/performed Overall Cognitive Status: Within Functional Limits for tasks assessed                      General Comments      Exercises Total Joint Exercises Ankle Circles/Pumps: AROM;Both;10 reps;Seated      Assessment/Plan     PT Assessment Patient needs continued PT services  PT Diagnosis Difficulty walking;Acute pain   PT Problem List Decreased strength;Decreased range of motion;Decreased activity tolerance;Decreased balance;Decreased mobility;Decreased knowledge of use of DME;Decreased knowledge of precautions;Pain  PT Treatment Interventions DME instruction;Gait training;Functional mobility training;Therapeutic activities;Therapeutic exercise;Patient/family education   PT Goals (Current goals can be found in the Care Plan section) Acute Rehab PT Goals Patient Stated Goal: To go home PT Goal Formulation: With patient/family Time For Goal Achievement: 07/21/14 Potential to Achieve Goals: Good    Frequency 7X/week   Barriers to discharge        Co-evaluation               End of Session Equipment Utilized During Treatment: Gait belt;Left knee immobilizer Activity Tolerance: Patient tolerated treatment well Patient left: in chair;with call bell/phone within reach;with family/visitor present Nurse Communication: Mobility status         Time: 5093-2671 PT Time Calculation (min) (ACUTE ONLY): 34 min   Charges:   PT Evaluation $Initial PT Evaluation Tier I: 1 Procedure PT Treatments $Gait Training: 8-22 mins   PT G CodesDespina Pole 08-04-14, 2:08 PM Carita Pian. Sanjuana Kava, [place] Pager [phone]

## 2014-07-14 NOTE — Anesthesia Postprocedure Evaluation (Signed)
  Anesthesia Post-op Note  Patient: Patrick Mathews  Procedure(s) Performed: Procedure(s): LEFT TOTAL KNEE ARTHROPLASTY (Left)  Patient Location: PACU  Anesthesia Type:General  Level of Consciousness: awake and alert   Airway and Oxygen Therapy: Patient Spontanous Breathing and Patient connected to nasal cannula oxygen  Post-op Pain: none  Post-op Assessment: Post-op Vital signs reviewed  Post-op Vital Signs: Reviewed and stable  Last Vitals:  Filed Vitals:   07/14/14 1015  BP: 145/78  Pulse: 76  Temp:   Resp: 16    Complications: No apparent anesthesia complications

## 2014-07-14 NOTE — Interval H&P Note (Signed)
History and Physical Interval Note:  07/14/2014 7:01 AM  Patrick Mathews  has presented today for surgery, with the diagnosis of PRIMARY LOCALIZED OA LEFT KNEE  The various methods of treatment have been discussed with the patient and family. After consideration of risks, benefits and other options for treatment, the patient has consented to  Procedure(s): LEFT TOTAL KNEE ARTHROPLASTY (Left) as a surgical intervention .  The patient's history has been reviewed, patient examined, no change in status, stable for surgery.  I have reviewed the patient's chart and labs.  Questions were answered to the patient's satisfaction.     [name]

## 2014-07-14 NOTE — Op Note (Signed)
MRN:     [id number] DOB/AGE:    64-Sep-1952 / 64 y.o.       OPERATIVE REPORT    DATE OF PROCEDURE:  07/14/2014       PREOPERATIVE DIAGNOSIS:  PRIMARY LOCALIZED OA LEFT KNEE      Estimated body mass index is 31.57 kg/(m^2) as calculated from the following:   Height as of this encounter: 5\' 10"  (1.778 m).   Weight as of this encounter: 99.791 kg (220 lb).                                                        POSTOPERATIVE DIAGNOSIS: SAME                                                                PROCEDURE:  Procedure(s): LEFT TOTAL KNEE ARTHROPLASTY Using Depuy Sigma RP implants #5 Femur, #5Tibia, 12.41mm sigma RP bearing, 35 Patella     SURGEON: Clatie Kessen A    ASSISTANT:  Kirstin Shepperson PA-C   (Present and scrubbed throughout the case, critical for assistance with exposure, retraction, instrumentation, and closure.)         ANESTHESIA: GET with Femoral Nerve Block  DRAINS: foley, 2 medium hemovac in knee   TOURNIQUET TIME: 70JGG   COMPLICATIONS:  None     SPECIMENS: None   INDICATIONS FOR PROCEDURE: The patient has  OA LEFT KNEE, varus deformities, XR shows bone on bone arthritis. Patient has failed all conservative measures including anti-inflammatory medicines, narcotics, attempts at  exercise and weight loss, cortisone injections and viscosupplementation.  Risks and benefits of surgery have been discussed, questions answered.   DESCRIPTION OF PROCEDURE: The patient identified by armband, received  right femoral nerve block and IV antibiotics, in the holding area at [facility]. Patient taken to the operating room, appropriate anesthetic  monitors were attached General endotracheal anesthesia induced with  the patient in supine position, Foley catheter was inserted. Tourniquet  applied high to the operative thigh. Lateral post and foot positioner  applied to the table, the lower extremity was then prepped and draped  in usual sterile fashion from the ankle to  the tourniquet. Time-out procedure was performed. The limb was wrapped with an Esmarch bandage and the tourniquet inflated to 365 mmHg. We began the operation by making the anterior midline incision starting at handbreadth above the patella going over the patella 1 cm medial to and  4 cm distal to the tibial tubercle. Small bleeders in the skin and the  subcutaneous tissue identified and cauterized. Transverse retinaculum was incised and reflected medially and a medial parapatellar arthrotomy was accomplished. the patella was everted and theprepatellar fat pad resected. The superficial medial collateral  ligament was then elevated from anterior to posterior along the proximal  flare of the tibia and anterior half of the menisci resected. The knee was hyperflexed exposing bone on bone arthritis. Peripheral and notch osteophytes as well as the cruciate ligaments were then resected. We continued to  work our way around posteriorly along the proximal tibia, and externally  rotated the tibia subluxing it out from  underneath the femur. A McHale  retractor was placed through the notch and a lateral Hohmann retractor  placed, and we then drilled through the proximal tibia in line with the  axis of the tibia followed by an intramedullary guide rod and 2-degree  posterior slope cutting guide. The tibial cutting guide was pinned into place  allowing resection of 4 mm of bone medially and about 6 mm of bone  laterally because of her varus deformity. Satisfied with the tibial resection, we then  entered the distal femur 2 mm anterior to the PCL origin with the  intramedullary guide rod and applied the distal femoral cutting guide  set at 11mm, with 5 degrees of valgus. This was pinned along the  epicondylar axis. At this point, the distal femoral cut was accomplished without difficulty. We then sized for a #5 femoral component and pinned the guide in 3 degrees of external rotation.The chamfer cutting guide was  pinned into place. The anterior, posterior, and chamfer cuts were accomplished without difficulty followed by  the Sigma RP box cutting guide and the box cut. We also removed posterior osteophytes from the posterior femoral condyles. At this  time, the knee was brought into full extension. We checked our  extension and flexion gaps and found them symmetric at 12.60mm.  The patella thickness measured at 25 mm. We set the cutting guide at 15 and removed the posterior 9.5-10 mm  of the patella sized for 35 button and drilled the lollipop. The knee  was then once again hyperflexed exposing the proximal tibia. We sized for a #4 tibial base plate, applied the smokestack and the conical reamer followed by the the Delta fin keel punch. We then hammered into place the Sigma RP trial femoral component, inserted a 12.5-mm trial bearing, trial patellar button, and took the knee through range of motion from 0-130 degrees. No thumb pressure was required for patellar  tracking. At this point, all trial components were removed, a double batch of DePuy HV cement with was mixed and applied to all bony metallic mating surfaces except for the posterior condyles of the femur itself. In order, we  hammered into place the tibial tray and removed excess cement, the femoral component and removed excess cement, a 12.5-mm Sigma RP bearing  was inserted, and the knee brought to full extension with compression.  The patellar button was clamped into place, and excess cement  removed. While the cement cured the wound was irrigated out with normal saline solution pulse lavage, and medium Hemovac drains were placed.. Ligament stability and patellar tracking were checked and found to be excellent. The tourniquet was then released and hemostasis was obtained with cautery. The parapatellar arthrotomy was closed with  #1 ethibond suture. The subcutaneous tissue with 0 and 2-0 undyed  Vicryl suture, and 4-0 Monocryl.. A dressing of Xeroform,   4 x 4, dressing sponges, Webril, and Ace wrap applied. Needle and sponge count were correct times 2.The patient awakened, extubated, and taken to recovery room without difficulty. Vascular status was normal, pulses 2+ and symmetric.   Cetronia A 07/14/2014, 8:47 AM

## 2014-07-15 ENCOUNTER — Encounter (HOSPITAL_COMMUNITY): Payer: Self-pay | Admitting: Orthopedic Surgery

## 2014-07-15 LAB — BASIC METABOLIC PANEL
Anion gap: 6 (ref 5–15)
BUN: 11 mg/dL (ref 6–23)
CO2: 24 mmol/L (ref 19–32)
Calcium: 8.1 mg/dL — ABNORMAL LOW (ref 8.4–10.5)
Chloride: 106 mmol/L (ref 96–112)
Creatinine, Ser: 1 mg/dL (ref 0.50–1.35)
GFR calc Af Amer: >90 mL/min (ref >90)
GFR calc non Af Amer: 78 mL/min — ABNORMAL LOW (ref >90)
Glucose, Bld: 126 mg/dL — ABNORMAL HIGH (ref 70–99)
Potassium: 4.4 mmol/L (ref 3.5–5.1)
Sodium: 136 mmol/L (ref 135–145)

## 2014-07-15 LAB — CBC
HCT: 34.6 % — ABNORMAL LOW (ref 39.0–52.0)
Hemoglobin: 12.2 g/dL — ABNORMAL LOW (ref 13.0–17.0)
MCH: 31.5 pg (ref 26.0–34.0)
MCHC: 35.3 g/dL (ref 30.0–36.0)
MCV: 89.4 fL (ref 78.0–100.0)
Platelets: 171 10*3/uL (ref 150–400)
RBC: 3.87 MIL/uL — ABNORMAL LOW (ref 4.22–5.81)
RDW: 13.1 % (ref 11.5–15.5)
WBC: 15.2 10*3/uL — ABNORMAL HIGH (ref 4.0–10.5)

## 2014-07-15 MED ORDER — POLYETHYLENE GLYCOL 3350 17 G PO PACK: 17.0000 g | PACK | Freq: Two times a day (BID) | ORAL | Status: DC

## 2014-07-15 MED ORDER — OXYCODONE HCL 5 MG PO TABS: ORAL_TABLET | ORAL | Status: DC

## 2014-07-15 MED ORDER — RIVAROXABAN 10 MG PO TABS: 10.0000 mg | ORAL_TABLET | Freq: Every day | ORAL | Status: DC

## 2014-07-15 MED ORDER — DOCUSATE SODIUM 100 MG PO CAPS: ORAL_CAPSULE | ORAL | Status: DC

## 2014-07-15 NOTE — Progress Notes (Signed)
Physical Therapy Treatment Patient Details Name: Patrick Mathews MRN: [id number] DOB: 1950/06/15 Today's Date: 07/15/2014    History of Present Illness Patient is a 64 yo male admitted 07/14/14 now s/p Lt TKA.  PMH:  OA, HLD, back pain, DOE, tobacco use, cervical fusion, back surgery x2    PT Comments    Excellent progress; OK for dc home from PT standpoint  All goals met; dc acute PT  Follow Up Recommendations  Home health PT;Supervision/Assistance - 24 hour     Equipment Recommendations  None recommended by PT    Recommendations for Other Services       Precautions / Restrictions Precautions Precautions: Knee Precaution Booklet Issued: Yes (comment) Precaution Comments: Pt educated to not allow any pillow or bolster under knee for healing with optimal range of motion.  Knee Immobilizer - Left:  (Nice stable knee) Restrictions Weight Bearing Restrictions: Yes LLE Weight Bearing: Weight bearing as tolerated    Mobility  Bed Mobility               General bed mobility comments: Pt described proper technique; he and wife are confident in his ability to get in/out of bed  Transfers Overall transfer level: Modified independent Equipment used: Rolling walker (2 wheeled) Transfers: Sit to/from Stand Sit to Stand: Modified independent (Device/Increase time)         General transfer comment: no need for cues  Ambulation/Gait Ambulation/Gait assistance: Modified independent (Device/Increase time);Supervision Ambulation Distance (Feet): 200 Feet Assistive device: Rolling walker (2 wheeled) Gait Pattern/deviations: Step-through pattern     General Gait Details: Cues initially for a more normal gait pattern; Very stable knee in stance; progressed to modified independent with RW   Stairs Stairs: Yes Stairs assistance: Min guard Stair Management: One rail Right;Step to pattern;Sideways Number of Stairs: 12 General stair comments: verbal and demo cues for  technique; managing very well  Wheelchair Mobility    Modified Rankin (Stroke Patients Only)       Balance                                    Cognition Arousal/Alertness: Awake/alert Behavior During Therapy: WFL for tasks assessed/performed Overall Cognitive Status: Within Functional Limits for tasks assessed                      Exercises Total Joint Exercises Long Arc Quad: Left;AROM;10 reps Knee Flexion: AAROM;Left;10 reps;Seated (taught self overpressure) Goniometric ROM: 3-90    General Comments        Pertinent Vitals/Pain Pain Assessment: 0-10 Pain Score: 5  Pain Location: L knee end of session Pain Descriptors / Indicators: Aching Pain Intervention(s): Monitored during session;Premedicated before session    Home Living                      Prior Function            PT Goals (current goals can now be found in the care plan section) Acute Rehab PT Goals Patient Stated Goal: To go home PT Goal Formulation: All assessment and education complete, DC therapy Progress towards PT goals: Progressing toward goals    Frequency  7X/week    PT Plan Current plan remains appropriate    Co-evaluation             End of Session Equipment Utilized During Treatment: Gait belt Activity Tolerance: Patient tolerated treatment  well Patient left: in chair;with call bell/phone within reach;with family/visitor present     Time: 0810-0838 PT Time Calculation (min) (ACUTE ONLY): 28 min  Charges:  $Gait Training: 8-22 mins $Therapeutic Exercise: 8-22 mins                    G Codes:      Patrick Mathews 07/15/2014, 8:51 AM  Patrick Mathews, [place] Pager [phone] Office [phone]

## 2014-07-15 NOTE — Progress Notes (Signed)
Patient d/c to home.  IV removed, prescriptions given, instructions reviewed.

## 2014-07-15 NOTE — Progress Notes (Signed)
07/15/14 Set up with [facility] for HHPT by MD office.Spoke with Ruby Cola from [place], they are providing CPM, rolling walker and 3N1. Spoke with patient and his wife about d/c plan, wife able to assist after d/c. No other d/c needs identified.

## 2014-07-15 NOTE — Evaluation (Signed)
Occupational Therapy Evaluation Patient Details Name: Patrick Mathews MRN: [id number] DOB: 05-Jan-1951 Today's Date: 07/15/2014    History of Present Illness Patient is a 64 yo male admitted 07/14/14 now s/p Lt TKA.  PMH:  OA, HLD, back pain, DOE, tobacco use, cervical fusion, back surgery x2   Clinical Impression   Pt at set up l evel with UB ADLs, min A LB ADLs, min guard A with toileting and Mod I with functional mobility. All education completed and no further acute OT services indicated at this time    Follow Up Recommendations  No OT follow up;Supervision/Assistance - 24 hour    Equipment Recommendations   none   Recommendations for Other Services       Precautions / Restrictions Precautions Precautions: Knee Precaution Booklet Issued: Yes (comment) Precaution Comments:  Knee Immobilizer - Left:   Restrictions Weight Bearing Restrictions: Yes LLE Weight Bearing: Weight bearing as tolerated      Mobility Bed Mobility               General bed mobility comments: pt up in recliner  Transfers Overall transfer level: Modified independent Equipment used: Rolling walker (2 wheeled) Transfers: Sit to/from Stand Sit to Stand: Modified independent (Device/Increase time)         General transfer comment: no need for cues    Balance Overall balance assessment: No apparent balance deficits (not formally assessed)                                          ADL Overall ADL's : Needs assistance/impaired     Grooming: Wash/dry hands;Wash/dry face;Standing;Supervision/safety   Upper Body Bathing: Sitting;Set up   Lower Body Bathing: Minimal assistance;Sitting/lateral leans;Sit to/from stand   Upper Body Dressing : Set up;Sitting   Lower Body Dressing: Sitting/lateral leans;Sit to/from stand;Minimal assistance   Toilet Transfer: Modified Independent;RW;Comfort height toilet   Toileting- Clothing Manipulation and Hygiene: Min guard;Sit to/from  stand   Tub/ Shower Transfer: Modified independent;Rolling walker;3 in 1   Functional mobility during ADLs: Modified independent General ADL Comments: Pt and wife educated on ADL A/E with demo     Vision  wears reading glasses                   Perception Perception Perception Tested?: No   Praxis Praxis Praxis tested?: Not tested    Pertinent Vitals/Pain Pain Assessment: 0-10 Pain Score: 5  Pain Location: L knee Pain Descriptors / Indicators: Aching;Throbbing Pain Intervention(s): Monitored during session;Premedicated before session     Hand Dominance Right   Extremity/Trunk Assessment Upper Extremity Assessment Upper Extremity Assessment: Overall WFL for tasks assessed       Cervical / Trunk Assessment Cervical / Trunk Assessment: Normal   Communication Communication Communication: No difficulties   Cognition Arousal/Alertness: Awake/alert Behavior During Therapy: WFL for tasks assessed/performed Overall Cognitive Status: Within Functional Limits for tasks assessed                     General Comments   pt very pleasant and cooperative                Home Living Family/patient expects to be discharged to:: Private residence Living Arrangements: Spouse/significant other Available Help at Discharge: Family;Available 24 hours/day Type of Home: House Home Access: Level entry     Home Layout: Two level;Able to live on main  level with bedroom/bathroom     Bathroom Shower/Tub: Tub/shower unit   Bathroom Toilet: Handicapped height     Home Equipment: Environmental consultant - 2 wheels;Bedside commode;Shower seat - built Hotel manager: [employer]acher        Prior Functioning/Environment Level of Independence: Independent        Comments: Very active.  Plays golf.  Retired    OT Diagnosis: Acute pain   OT Problem List:  ADLs   OT Treatment/Interventions:  there act   OT Goals(Current goals can be found in the care plan  section) Acute Rehab OT Goals Patient Stated Goal: To go home OT Goal Formulation: With patient/family  OT Frequency:     Barriers to D/C:  none                        End of Session Equipment Utilized During Treatment: Rolling walker;Other (comment) (3 in 1 over toilet) CPM Left Knee CPM Left Knee: Off  Activity Tolerance: Patient tolerated treatment well Patient left: in chair;with family/visitor present   Time: 0912-0935 OT Time Calculation (min): 23 min Charges:  OT General Charges $OT Visit: 1 Procedure OT Evaluation $Initial OT Evaluation Tier I: 1 Procedure OT Treatments $Therapeutic Activity: 8-22 mins G-Codes:    Britt Bottom 07/15/2014, 10:48 AM

## 2014-07-17 NOTE — Discharge Summary (Signed)
Patient ID: Patrick Mathews MRN: [id number] DOB/AGE: 1950-12-19 64 y.o.  Admit date: 07/14/2014 Discharge date: 07/17/2014  Admission Diagnoses:  Principal Problem:   Primary localized osteoarthritis of left knee Active Problems:   Multiple pulmonary nodules   Hyperlipidemia   GERD (gastroesophageal reflux disease)   Back pain   DJD (degenerative joint disease) of knee   Discharge Diagnoses:  Same  Past Medical History  Diagnosis Date  . Hyperlipidemia   . GERD (gastroesophageal reflux disease)   . Seasonal allergies   . Asthma   . Exertional dyspnea   . Pneumonia 11/2013  . OSA on CPAP     uses cpap occasionally  . Primary localized osteoarthritis of left knee   . Arthritis     "knees; back" (07/14/2014)    Surgeries: Procedure(s): LEFT TOTAL KNEE ARTHROPLASTY on 07/14/2014   Consultants:    Discharged Condition: Improved  Hospital Course: JAEL KOSTICK is an 64 y.o. male who was admitted 07/14/2014 for operative treatment ofPrimary localized osteoarthritis of left knee. Patient has severe unremitting pain that affects sleep, daily activities, and work/hobbies. After pre-op clearance the patient was taken to the operating room on 07/14/2014 and underwent  Procedure(s): LEFT TOTAL KNEE ARTHROPLASTY.    Patient was given perioperative antibiotics: Anti-infectives    Start     Dose/Rate Route Frequency Ordered Stop   07/14/14 1300  ceFAZolin (ANCEF) IVPB 2 g/50 mL premix     2 g100 mL/hr over 30 Minutes Intravenous Every 6 hours 07/14/14 1028 07/14/14 1856   07/14/14 0600  ceFAZolin (ANCEF) IVPB 2 g/50 mL premix     2 g100 mL/hr over 30 Minutes Intravenous On call to O.R. 07/13/14 1337 07/14/14 0724       Patient was given sequential compression devices, early ambulation, and chemoprophylaxis to prevent DVT.  Patient benefited maximally from hospital stay and there were no complications.    Recent vital signs: No data found.    Recent laboratory studies:  Recent  Labs  07/15/14 0615  WBC 15.2*  HGB 12.2*  HCT 34.6*  PLT 171  NA 136  K 4.4  CL 106  CO2 24  BUN 11  CREATININE 1.00  GLUCOSE 126*  CALCIUM 8.1*     Discharge Medications:     Medication List    STOP taking these medications        amoxicillin-clavulanate 875-125 MG per tablet  Commonly known as:  AUGMENTIN     famotidine 20 MG tablet  Commonly known as:  PEPCID     ibuprofen 200 MG tablet  Commonly known as:  ADVIL,MOTRIN      TAKE these medications        acetaminophen 650 MG CR tablet  Commonly known as:  TYLENOL  Take 650 mg by mouth every 8 (eight) hours as needed for pain.     atorvastatin 20 MG tablet  Commonly known as:  LIPITOR  Take 20 mg by mouth daily.     dextromethorphan-guaiFENesin 30-600 MG per 12 hr tablet  Commonly known as:  MUCINEX DM  Take 1 tablet by mouth 2 (two) times daily as needed for cough.     docusate sodium 100 MG capsule  Commonly known as:  COLACE  1 tab 2 times a day while on narcotics.  STOOL SOFTENER     oxyCODONE 5 MG immediate release tablet  Commonly known as:  Oxy IR/ROXICODONE  1-2 tablets every 4-6 hrs as needed for pain     polyethylene  glycol packet  Commonly known as:  MIRALAX / GLYCOLAX  Take 17 g by mouth 2 (two) times daily.     RABEprazole 20 MG tablet  Commonly known as:  ACIPHEX  Take 20 mg by mouth daily.     rivaroxaban 10 MG Tabs tablet  Commonly known as:  XARELTO  Take 1 tablet (10 mg total) by mouth daily with breakfast.     VITAMIN B-12 PO  Take 1 tablet by mouth daily.        Diagnostic Studies: Dg Chest 2 View  07/03/2014   CLINICAL DATA:  Initial evaluation for rest or infection personal history of sleep apnea and quit smoking in 2014  EXAM: CHEST  2 VIEW  COMPARISON:  11/08/2013  FINDINGS: Heart size and vascular pattern are normal. Mild uncoiling of the aorta. No consolidation or effusion. Moderate elevation of the right diaphragm similar to prior study.  IMPRESSION: No active  cardiopulmonary disease.   Electronically Signed   By: Skipper Cliche M.D.   On: 07/03/2014 10:37    Disposition: 01-Home or Self Care      Discharge Instructions    CPM    Complete by:  As directed   Continuous passive motion machine (CPM):      Use the CPM from 0 to 90 for 6 hours per day.       You may break it up into 2 or 3 sessions per day.      Use CPM for 2 weeks or until you are told to stop.     Call MD / Call 911    Complete by:  As directed   If you experience chest pain or shortness of breath, CALL 911 and be transported to the hospital emergency room.  If you develope a fever above 101 F, pus (white drainage) or increased drainage or redness at the wound, or calf pain, call your surgeon's office.     Change dressing    Complete by:  As directed   Keepaquacel bandage over wound.  Wash whole leg including over the aquacel every day with soap and water.  Change guaze daily until drainage stops from drain wounds     Constipation Prevention    Complete by:  As directed   Drink plenty of fluids.  Prune juice may be helpful.  You may use a stool softener, such as Colace (over the counter) 100 mg twice a day.  Use MiraLax (over the counter) for constipation as needed.     Diet - low sodium heart healthy    Complete by:  As directed      Discharge instructions    Complete by:  As directed   Total Knee Replacement Care After Refer to this sheet in the next few weeks. These discharge instructions provide you with general information on caring for yourself after you leave the hospital. Your caregiver may also give you specific instructions. Your treatment has been planned according to the most current medical practices available, but unavoidable complications sometimes occur. If you have any problems or questions after discharge, please call your caregiver. Regaining a near full range of motion of your knee within the first 3 to 6 weeks after surgery is critical. [place] may resume a normal diet and activities as directed.  Perform exercises as directed.  Place gray foam block, curve side up under heel at all times except when in CPM or when walking.  DO NOT modify, tear, cut,  or change in any way the gray foam block. You will receive physical therapy daily  Take showers instead of baths until informed otherwise.  You may shower on Friday.  Please wash whole leg including wound with soap and water over aquacel dressing.  DO NOT REMOVE AQUACEL DRESSING.  DR Noemi Chapel WILL TAKE IT OFF IN THE OFFICE AT THE POST OP APPOINTMENT  It is OK to take over-the-counter tylenol in addition to the oxycodone for pain, discomfort, or fever. Oxycodone is VERY constipating.  Please take stool softener twice a day and laxatives daily until bowels are regular Eat a well-balanced diet.  Avoid lifting or driving until you are instructed otherwise.  Make an appointment to see your caregiver for stitches (suture) or staple removal as directed.  If you have been sent home with a continuous passive motion machine (CPM machine), 0-90 degrees 6 hrs a day   2 hrs a shift SEEK MEDICAL CARE IF: You have swelling of your calf or leg.  You develop shortness of breath or chest pain.  You have redness, swelling, or increasing pain in the wound.  There is pus or any unusual drainage coming from the surgical site.  You notice a bad smell coming from the surgical site or dressing.  The surgical site breaks open after sutures or staples have been removed.  There is persistent bleeding from the suture or staple line.  You are getting worse or are not improving.  You have any other questions or concerns.  SEEK IMMEDIATE MEDICAL CARE IF:  You have a fever.  You develop a rash.  You have difficulty breathing.  You develop any reaction or side effects to medicines given.  Your knee motion is decreasing rather than improving.  MAKE SURE YOU:  Understand these instructions.  Will  watch your condition.  Will get help right away if you are not doing well or get worse.     Do not put a pillow under the knee. Place it under the heel.    Complete by:  As directed   Place gray foam block, curve side up under heel at all times except when in CPM or when walking.  DO NOT modify, tear, cut, or change in any way the gray foam block.     Increase activity slowly as tolerated    Complete by:  As directed      TED hose    Complete by:  As directed   Use stockings (TED hose) for 2 weeks on both leg(s).  You may remove them at night for sleeping.           Follow-up Information    Follow up with [name], MD On 07/28/2014.   Specialty:  Orthopedic Surgery   Why:  appt time 3:15 pm   Contact information:   [place] [address] [phone]       Follow up with [facility].   Why:  They will contact you to schedule home therapy visits.   Contact information:   [address] [place] [address] [phone]        Signed: Linda Hedges 07/17/2014, 2:45 PM

## 2014-07-30 ENCOUNTER — Ambulatory Visit (HOSPITAL_COMMUNITY): Payer: 59 | Attending: Orthopedic Surgery | Admitting: Physical Therapy

## 2014-07-30 DIAGNOSIS — M25662 Stiffness of left knee, not elsewhere classified: Secondary | ICD-10-CM

## 2014-07-30 DIAGNOSIS — Z96652 Presence of left artificial knee joint: Secondary | ICD-10-CM | POA: Insufficient documentation

## 2014-07-30 DIAGNOSIS — R269 Unspecified abnormalities of gait and mobility: Secondary | ICD-10-CM

## 2014-07-30 DIAGNOSIS — R262 Difficulty in walking, not elsewhere classified: Secondary | ICD-10-CM

## 2014-07-30 DIAGNOSIS — M25562 Pain in left knee: Secondary | ICD-10-CM

## 2014-07-30 DIAGNOSIS — Z471 Aftercare following joint replacement surgery: Secondary | ICD-10-CM | POA: Diagnosis present

## 2014-07-30 NOTE — Therapy (Signed)
[place], [place], [address] Phone: [phone]   Fax:  [phone]  Physical Therapy Evaluation  Patient Details  Name: Patrick Mathews MRN: [id number] Date of Birth: 1951-03-17 Referring Provider:  [name], MD  Encounter Date: 07/30/2014      PT End of Session - 07/30/14 1756    Visit Number 1   Number of Visits 16   Date for PT Re-Evaluation 08/29/14   Authorization Type UHC/Medicare   Authorization - Visit Number 1   Authorization - Number of Visits 16   PT Start Time 2409   PT Stop Time 1750   PT Time Calculation (min) 55 min   Equipment Utilized During Treatment Gait belt   Activity Tolerance Patient tolerated treatment well   Behavior During Therapy [facility] for tasks assessed/performed      Past Medical History  Diagnosis Date  . Hyperlipidemia   . GERD (gastroesophageal reflux disease)   . Seasonal allergies   . Asthma   . Exertional dyspnea   . Pneumonia 11/2013  . OSA on CPAP     uses cpap occasionally  . Primary localized osteoarthritis of left knee   . Arthritis     "knees; back" (07/14/2014)    Past Surgical History  Procedure Laterality Date  . Knee arthroscopy Bilateral 1990's  . Bunionectomy Right  2013  . Colonoscopy N/A 11/06/2012    Procedure: COLONOSCOPY;  Surgeon: Jamesetta So, MD;  [facility];  Service: Gastroenterology;  Laterality: N/A;  . 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 Joint replacement    . Tonsillectomy  1950's  . Posterior lumbar fusion  1985; 2009    Dr Joya Salm  . Anterior cervical decomp/discectomy fusion  2009    Dr Joya Salm  . Back surgery    . Total knee arthroplasty Left 07/14/2014    Procedure: LEFT TOTAL KNEE ARTHROPLASTY;  Surgeon: [name], MD;  Location: [facility];  Service: Orthopedics;  Laterality: Left;    There were no vitals taken for this visit.  Visit Diagnosis:  Knee stiffness, left  Difficulty  walking  Abnormality of gait  Left knee pain      Subjective Assessment - 07/30/14 1710    Symptoms Minor Lt knee pain.    Pertinent History 07/14/14 Lt TKA, multiple lumbar spine surgeries and a cervical spine surgery. Patient lives an active life style plays golf 3 days a weels. History of Rt knee scope. Patient notes Rt knee also has pain and will likely have Rt TKA in future. [name] performed surgery. Patient had 5 HHPT viists. Patient is driving.    How long can you walk comfortably? patient able to walk 10 minutes or a city block comfortably.    Patient Stated Goals Dififculty putting on pants, difficulty sleeping on side and stomach,  wants to be bale to squat to ground to work in yard.    Currently in Pain? Yes   Pain Score 2    Pain Location Knee   Pain Orientation Left   Pain Descriptors / Indicators Throbbing   Pain Type Surgical pain   Pain Onset 1 to 4 weeks ago   Pain Frequency Constant   Aggravating Factors  excessive flexion and prolonged weight bearing.   Effect of Pain on Daily Activities unable to retun to golf, difficulty sleeping. and difficulty putting on pants          [facility] PT Assessment -  07/30/14 0001    Assessment   Medical Diagnosis Lt TKA resulting in difficulty walking and stiffness in Lt knee.    Onset Date 07/14/14   Next MD Visit [facility]: 08/26/14   Prior Therapy yes HHPT   Balance Screen   Has the patient fallen in the past 6 months No   Has the patient had a decrease in activity level because of a fear of falling?  No   Is the patient reluctant to leave their home because of a fear of falling?  No   Prior Function   Level of Independence Independent with basic ADLs   Functional Tests   Functional tests Sit to Stand;Other;Other2   Other:   Other/ Comments Gait: limited hip internal rotation,  biltaerally,  excessive toe out, limited ankles collapse normal gait secondaryu to hip stiffness. limited knee extension    Posture/Postural Control    Posture Comments WNL   AROM   Right Hip External Rotation  55   Right Hip Internal Rotation  17   Left Hip External Rotation  27   Left Hip Internal Rotation  8   Left Knee Extension -10   Left Knee Flexion 103   Right Ankle Dorsiflexion 20   Left Ankle Dorsiflexion 3   Strength   Right Hip Flexion 5/5   Left Hip Flexion 5/5   Right Knee Flexion 5/5   Right Knee Extension 5/5   Left Knee Flexion 4+/5   Left Knee Extension 4+/5   Left Ankle Dorsiflexion 5/5             [facility] Adult PT Treatment/Exercise - 07/30/14 0001    Exercises   Exercises Knee/Hip   Knee/Hip Exercises: Stretches   Active Hamstring Stretch 20 seconds   Active Hamstring Stretch Limitations  3 way   Hip Flexor Stretch 20 seconds   Hip Flexor Stretch Limitations 3 way   Gastroc Stretch 20 seconds   Gastroc Stretch Limitations 3 way   Knee/Hip Exercises: Standing   Functional Squat Limitations squat walk around to increase hip internal rotation 10x to 6" step            PT Education - 07/30/14 1755    Education provided Yes   Education Details HEP: hamstring, calf, and hip flexor stretch. Prognosis, DIagonosis, use of ice and elevation for swelling, education on perofrmance of normal exercise routine.    Person(s) Educated Patient   Methods Explanation;Demonstration;Handout   Comprehension Verbalized understanding;Returned demonstration          PT Short Term Goals - 07/30/14 1804    PT SHORT TERM GOAL #1   Title Patient will demonstrate increased knee extension to -5 degrees to increased stride length.   Baseline -10   Time 4   Period Weeks   Status New   PT SHORT TERM GOAL #2   Title Patient will demonstrate increased knee flexion of 115 degrees   Baseline 103   Time 4   Period Weeks   Status New   PT SHORT TERM GOAL #3   Title Patient will demonstrate increased Lt hip internal rotation 20 degrees to improve deceleration mechanics.    Baseline Lt: 8, Rt 17   Time 4   Period  Weeks   Status New   PT SHORT TERM GOAL #4   Title Patient will demonstrate increased ankle dorsiflexion of 9 degrees   Baseline Lt 3   Time 4   Period Weeks   Status New   PT  SHORT TERM GOAL #5   Title Patient will be independent with HEP.    Time 4   Period Weeks   Status New           PT Long Term Goals - 07/30/14 1807    PT LONG TERM GOAL #1   Title Patient will demonstrate increased knee extension to 0 degrees to increased stride length   Time 8   Period Weeks   Status New   PT LONG TERM GOAL #2   Title Patient will demonstrate increased knee flexion of 130 degrees to be able to squat to the ground to work in garden   Time 8   Period Weeks   Status New   PT LONG TERM GOAL #3   Title Patient will demonstrate increased bilateral hip internal rotation 30 degrees to improve deceleration mechanics.    Time 8   PT LONG TERM GOAL #4   Title Patient will demonstrate increased ankle dorsiflexion of 14 degrees   Time 8   Period Weeks   Status New   PT LONG TERM GOAL #5   Title Patient will be able to walk 1 hour without pain.    Time 8   Period Weeks   Status New          Plan - 07/30/14 1758    Clinical Impression Statement Patient demosntrated limited knee fllexion and extension resulting in limited stride length during gait. Patient will benefit from skilled physical therapy to increase hip, knee and ankle mobility to normalize gait mechanics and improve ability to return to playing golf.    Pt will benefit from skilled therapeutic intervention in order to improve on the following deficits Abnormal gait;Decreased strength;Pain;Difficulty walking;Decreased activity tolerance;Decreased range of motion;Impaired flexibility;Decreased endurance   Rehab Potential Good   PT Frequency 2x / week   PT Duration 8 weeks   PT Treatment/Interventions Therapeutic exercise;Balance training;Patient/family education;Manual techniques;Therapeutic activities   PT Next Visit Plan  introduce 3D hip excursions and focus on increasing knee extension.    PT Home Exercise Plan hamstring, hip flexor, and calf 3 way stretches.    Consulted and Agree with Plan of Care Patient         Problem List Patient Active Problem List   Diagnosis Date Noted  . DJD (degenerative joint disease) of knee 07/14/2014  . Acute upper respiratory infection 06/12/2014  . Cigarette smoker 06/12/2014  . Cough 06/12/2014  . Primary localized osteoarthritis of left knee   . Hyperlipidemia   . GERD (gastroesophageal reflux disease)   . Back pain   . Dyspnea 05/07/2014  . Multiple pulmonary nodules 05/07/2014   Devona Konig PT DPT [place], [place], [address] Phone: [phone]   Fax:  [phone]

## 2014-07-31 ENCOUNTER — Ambulatory Visit (HOSPITAL_COMMUNITY): Payer: 59 | Admitting: Physical Therapy

## 2014-07-31 DIAGNOSIS — Z471 Aftercare following joint replacement surgery: Secondary | ICD-10-CM | POA: Diagnosis not present

## 2014-07-31 NOTE — Therapy (Signed)
[place], [place], [address] Phone: [phone]   Fax:  [phone]  Physical Therapy Treatment  Patient Details  Name: Patrick Mathews MRN: [id number] Date of Birth: Jul 03, 1950 Referring Provider:  [name], MD  Encounter Date: 07/31/2014      PT End of Session - 07/31/14 1241    Visit Number 2   Number of Visits 16   Date for PT Re-Evaluation 08/29/14   Authorization Type UHC/Medicare   Authorization - Visit Number 2   Authorization - Number of Visits 16   PT Start Time 1150   PT Stop Time 1245   PT Time Calculation (min) 55 min   Equipment Utilized During Treatment Gait belt   Activity Tolerance Patient tolerated treatment well   Behavior During Therapy [facility] for tasks assessed/performed      Past Medical History  Diagnosis Date  . Hyperlipidemia   . GERD (gastroesophageal reflux disease)   . Seasonal allergies   . Asthma   . Exertional dyspnea   . Pneumonia 11/2013  . OSA on CPAP     uses cpap occasionally  . Primary localized osteoarthritis of left knee   . Arthritis     "knees; back" (07/14/2014)    Past Surgical History  Procedure Laterality Date  . Knee arthroscopy Bilateral 1990's  . Bunionectomy Right  2013  . Colonoscopy N/A 11/06/2012    Procedure: COLONOSCOPY;  Surgeon: Jamesetta So, MD;  [facility];  Service: Gastroenterology;  Laterality: N/A;  . 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 Joint replacement    . Tonsillectomy  1950's  . Posterior lumbar fusion  1985; 2009    Dr Joya Salm  . Anterior cervical decomp/discectomy fusion  2009    Dr Joya Salm  . Back surgery    . 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  Visit Diagnosis:  No diagnosis found.      Subjective Assessment -  07/31/14 1153    Symptoms Pt reports a little stiffness and soreness but not bad.  States he iced his knee down good last night.    Currently in Pain? No/denies                    [facility] Adult PT Treatment/Exercise - 07/31/14 1156    Knee/Hip Exercises: Stretches   Active Hamstring Stretch 20 seconds   Active Hamstring Stretch Limitations  3 way   Hip Flexor Stretch 20 seconds   Hip Flexor Stretch Limitations 3 way on    Gastroc Stretch 20 seconds   Gastroc Stretch Limitations 3 way   Knee/Hip Exercises: Aerobic   Stationary Bike 8 minutes seat 13->9 full revolutions   Knee/Hip Exercises: Standing   Heel Raises 10 reps;Limitations   Heel Raises Limitations toeraises   Knee Flexion AROM;Left;10 reps   Forward Lunges Left;10 reps   Forward Lunges Limitations onto 4" step   Functional Squat 10 reps   Functional Squat Limitations squat walk around to increase hip internal rotation 10x to 6" step                 PT Education - 07/30/14 1755    Education provided Yes   Education Details HEP: hamstring, calf, and hip flexor stretch. Prognosis, DIagonosis,  use of ice and elevation for swelling, education on perofrmance of normal exercise routine.    Person(s) Educated Patient   Methods Explanation;Demonstration;Handout   Comprehension Verbalized understanding;Returned demonstration          PT Short Term Goals - 07/30/14 1804    PT SHORT TERM GOAL #1   Title Patient will demonstrate increased knee extension to -5 degrees to increased stride length.   Baseline -10   Time 4   Period Weeks   Status New   PT SHORT TERM GOAL #2   Title Patient will demonstrate increased knee flexion of 115 degrees   Baseline 103   Time 4   Period Weeks   Status New   PT SHORT TERM GOAL #3   Title Patient will demonstrate increased Lt hip internal rotation 20 degrees to improve deceleration mechanics.    Baseline Lt: 8, Rt 17   Time 4   Period Weeks   Status New   PT SHORT  TERM GOAL #4   Title Patient will demonstrate increased ankle dorsiflexion of 9 degrees   Baseline Lt 3   Time 4   Period Weeks   Status New   PT SHORT TERM GOAL #5   Title Patient will be independent with HEP.    Time 4   Period Weeks   Status New           PT Long Term Goals - 07/30/14 1807    PT LONG TERM GOAL #1   Title Patient will demonstrate increased knee extension to 0 degrees to increased stride length   Time 8   Period Weeks   Status New   PT LONG TERM GOAL #2   Title Patient will demonstrate increased knee flexion of 130 degrees to be able to squat to the ground to work in garden   Time 8   Period Weeks   Status New   PT LONG TERM GOAL #3   Title Patient will demonstrate increased bilateral hip internal rotation 30 degrees to improve deceleration mechanics.    Time 8   PT LONG TERM GOAL #4   Title Patient will demonstrate increased ankle dorsiflexion of 14 degrees   Time 8   Period Weeks   Status New   PT LONG TERM GOAL #5   Title Patient will be able to walk 1 hour without pain.    Time 8   Period Weeks   Status New               Plan - 07/31/14 1323    Clinical Impression Statement Instructed with stretches and began AROM for bilateral LE's.  Pt with good form and no c/o pain during session.  Added bike to help increase flexion and ended with icepack at EOS to decrease pain and inflammation (pt was not going straight home after session).     PT Next Visit Plan introduce 3D hip excursions and focus on increasing knee extension. Begin step ups.   Consulted and Agree with Plan of Care Patient        Problem List Patient Active Problem List   Diagnosis Date Noted  . DJD (degenerative joint disease) of knee 07/14/2014  . Acute upper respiratory infection 06/12/2014  . Cigarette smoker 06/12/2014  . Cough 06/12/2014  . Primary localized osteoarthritis of left knee   . Hyperlipidemia   . GERD (gastroesophageal reflux disease)   . Back pain    . Dyspnea 05/07/2014  . Multiple pulmonary nodules 05/07/2014  Teena Irani, PTA/CLT [phone] 07/31/2014, 1:44 PM  [facility] [address] [place], [place], [address] Phone: [phone]   Fax:  [phone]

## 2014-08-04 ENCOUNTER — Ambulatory Visit (HOSPITAL_COMMUNITY): Payer: 59 | Admitting: Physical Therapy

## 2014-08-04 DIAGNOSIS — R262 Difficulty in walking, not elsewhere classified: Secondary | ICD-10-CM

## 2014-08-04 DIAGNOSIS — M25662 Stiffness of left knee, not elsewhere classified: Secondary | ICD-10-CM

## 2014-08-04 DIAGNOSIS — R269 Unspecified abnormalities of gait and mobility: Secondary | ICD-10-CM

## 2014-08-04 DIAGNOSIS — M25562 Pain in left knee: Secondary | ICD-10-CM

## 2014-08-04 DIAGNOSIS — Z471 Aftercare following joint replacement surgery: Secondary | ICD-10-CM | POA: Diagnosis not present

## 2014-08-04 NOTE — Therapy (Signed)
[facility] [address] [place], [place], [address] Phone: [phone]   Fax:  [phone]  Physical Therapy Treatment  Patient Details  Name: Patrick Mathews MRN: [id number] Date of Birth: February 02, 1951 Referring Provider:  [name], MD  Encounter Date: 08/04/2014      PT End of Session - 08/04/14 1530    Visit Number 3   Number of Visits 16   Date for PT Re-Evaluation 08/29/14   Authorization Type UHC/Medicare   Authorization - Visit Number 3   Authorization - Number of Visits 16   PT Start Time 1157   PT Stop Time 1430   PT Time Calculation (min) 45 min   Equipment Utilized During Treatment Gait belt   Activity Tolerance Patient tolerated treatment well   Behavior During Therapy [facility] for tasks assessed/performed      Past Medical History  Diagnosis Date  . Hyperlipidemia   . GERD (gastroesophageal reflux disease)   . Seasonal allergies   . Asthma   . Exertional dyspnea   . Pneumonia 11/2013  . OSA on CPAP     uses cpap occasionally  . Primary localized osteoarthritis of left knee   . Arthritis     "knees; back" (07/14/2014)    Past Surgical History  Procedure Laterality Date  . Knee arthroscopy Bilateral 1990's  . Bunionectomy Right  2013  . Colonoscopy N/A 11/06/2012    Procedure: COLONOSCOPY;  Surgeon: Jamesetta So, MD;  [facility];  Service: Gastroenterology;  Laterality: N/A;  . 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 Joint replacement    . Tonsillectomy  1950's  . Posterior lumbar fusion  1985; 2009    Dr Joya Salm  . Anterior cervical decomp/discectomy fusion  2009    Dr Joya Salm  . Back surgery    . 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  Visit Diagnosis:  Knee stiffness, left  Difficulty  walking  Abnormality of gait  Left knee pain      Subjective Assessment - 08/04/14 1358    Symptoms Pt states the back of his Lt calf is sore and tight today.  STates mostly tightness in his knee, no real pain.  States he has been working hard on his exercises at home.    Currently in Pain? No/denies                    [facility] Adult PT Treatment/Exercise - 08/04/14 1357    Knee/Hip Exercises: Stretches   Active Hamstring Stretch 20 seconds   Active Hamstring Stretch Limitations  3 way 8" box   Hip Flexor Stretch 20 seconds   Hip Flexor Stretch Limitations 3 way on    Gastroc Stretch 20 seconds   Gastroc Stretch Limitations 3 way   Knee/Hip Exercises: Aerobic   Stationary Bike 8 minutes seat 12->9 full revolutions   Knee/Hip Exercises: Standing   Heel Raises 15 reps;Limitations   Heel Raises Limitations toeraises 15 reps   Knee Flexion AROM;Left;Limitations;15 reps   Knee Flexion Limitations toe resting on 8" box   Forward Lunges Left;10 reps   Forward Lunges Limitations onto 4" step   Lateral Step Up 10 reps;Step Height: 4";Hand Hold: 0   Forward Step Up 10 reps;Step Height: 4";Hand Hold: 0  Step Down 10 reps;Step Height: 4";Hand Hold: 0   Functional Squat 10 reps   Functional Squat Limitations squat walk around to increase hip internal rotation 10x to 6" step    Manual Therapy   Manual Therapy Massage   Massage retro to posterior Lt calf in prone                   PT Short Term Goals - 07/30/14 1804    PT SHORT TERM GOAL #1   Title Patient will demonstrate increased knee extension to -5 degrees to increased stride length.   Baseline -10   Time 4   Period Weeks   Status New   PT SHORT TERM GOAL #2   Title Patient will demonstrate increased knee flexion of 115 degrees   Baseline 103   Time 4   Period Weeks   Status New   PT SHORT TERM GOAL #3   Title Patient will demonstrate increased Lt hip internal rotation 20 degrees to improve  deceleration mechanics.    Baseline Lt: 8, Rt 17   Time 4   Period Weeks   Status New   PT SHORT TERM GOAL #4   Title Patient will demonstrate increased ankle dorsiflexion of 9 degrees   Baseline Lt 3   Time 4   Period Weeks   Status New   PT SHORT TERM GOAL #5   Title Patient will be independent with HEP.    Time 4   Period Weeks   Status New           PT Long Term Goals - 07/30/14 1807    PT LONG TERM GOAL #1   Title Patient will demonstrate increased knee extension to 0 degrees to increased stride length   Time 8   Period Weeks   Status New   PT LONG TERM GOAL #2   Title Patient will demonstrate increased knee flexion of 130 degrees to be able to squat to the ground to work in garden   Time 8   Period Weeks   Status New   PT LONG TERM GOAL #3   Title Patient will demonstrate increased bilateral hip internal rotation 30 degrees to improve deceleration mechanics.    Time 8   PT LONG TERM GOAL #4   Title Patient will demonstrate increased ankle dorsiflexion of 14 degrees   Time 8   Period Weeks   Status New   PT LONG TERM GOAL #5   Title Patient will be able to walk 1 hour without pain.    Time 8   Period Weeks   Status New               Plan - 08/04/14 1531    Clinical Impression Statement Continued to progress exercises.  pt with much improved AROM, however c/o Lt calf pain and discomfort.  Pt with negative homans sign.  PT with noted bruising and induration in distal Lt LE.  Added prone retro massage with good results including reduced discomfort and induration.  added step up actvity to POC without pain or difficulty.     PT Next Visit Plan Continue to increase AROM of Lt knee and progress functional activity.    Consulted and Agree with Plan of Care Patient        Problem List Patient Active Problem List   Diagnosis Date Noted  . DJD (degenerative joint disease) of knee 07/14/2014  . Acute upper respiratory infection 06/12/2014  . Cigarette  smoker 06/12/2014  . Cough 06/12/2014  . Primary localized osteoarthritis of left knee   . Hyperlipidemia   . GERD (gastroesophageal reflux disease)   . Back pain   . Dyspnea 05/07/2014  . Multiple pulmonary nodules 05/07/2014    Teena Irani, PTA/CLT [phone] 08/04/2014, 4:11 PM  [facility] [address] [place], [place], [address] Phone: [phone]   Fax:  [phone]

## 2014-08-07 ENCOUNTER — Ambulatory Visit (HOSPITAL_COMMUNITY): Payer: 59 | Admitting: Physical Therapy

## 2014-08-07 DIAGNOSIS — M25562 Pain in left knee: Secondary | ICD-10-CM

## 2014-08-07 DIAGNOSIS — R269 Unspecified abnormalities of gait and mobility: Secondary | ICD-10-CM

## 2014-08-07 DIAGNOSIS — M25662 Stiffness of left knee, not elsewhere classified: Secondary | ICD-10-CM

## 2014-08-07 DIAGNOSIS — Z471 Aftercare following joint replacement surgery: Secondary | ICD-10-CM | POA: Diagnosis not present

## 2014-08-07 DIAGNOSIS — R262 Difficulty in walking, not elsewhere classified: Secondary | ICD-10-CM

## 2014-08-07 NOTE — Therapy (Signed)
[facility] [address] [place], [place], [address] Phone: [phone]   Fax:  [phone]  Physical Therapy Treatment  Patient Details  Name: Patrick Mathews MRN: [id number] Date of Birth: August 18, 1950 Referring Provider:  [name], MD  Encounter Date: 08/07/2014      PT End of Session - 08/07/14 0915    Visit Number 4   Number of Visits 16   Date for PT Re-Evaluation 08/29/14   Authorization Type UHC/Medicare   Authorization - Visit Number 4   Authorization - Number of Visits 16   PT Start Time 0804   PT Stop Time 0905   PT Time Calculation (min) 61 min   Equipment Utilized During Treatment Gait belt   Activity Tolerance Patient tolerated treatment well   Behavior During Therapy [facility] for tasks assessed/performed      Past Medical History  Diagnosis Date  . Hyperlipidemia   . GERD (gastroesophageal reflux disease)   . Seasonal allergies   . Asthma   . Exertional dyspnea   . Pneumonia 11/2013  . OSA on CPAP     uses cpap occasionally  . Primary localized osteoarthritis of left knee   . Arthritis     "knees; back" (07/14/2014)    Past Surgical History  Procedure Laterality Date  . Knee arthroscopy Bilateral 1990's  . Bunionectomy Right  2013  . Colonoscopy N/A 11/06/2012    Procedure: COLONOSCOPY;  Surgeon: Jamesetta So, MD;  [facility];  Service: Gastroenterology;  Laterality: N/A;  . 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 Joint replacement    . Tonsillectomy  1950's  . Posterior lumbar fusion  1985; 2009    Dr Joya Salm  . Anterior cervical decomp/discectomy fusion  2009    Dr Joya Salm  . Back surgery    . 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  Visit Diagnosis:  Knee stiffness, left  Difficulty  walking  Abnormality of gait  Left knee pain      Subjective Assessment - 08/07/14 0812    Symptoms Pt states his knee is tight this morning.  States he's been going to the gymn past couple of days to work on his upper body and been riding the bicycle/stretches for LE too.  Currently wtih 6/10 pain.   Currently in Pain? Yes   Pain Score 6    Pain Location Knee   Pain Orientation Left                    [facility] Adult PT Treatment/Exercise - 08/07/14 0815    Knee/Hip Exercises: Stretches   Active Hamstring Stretch 20 seconds   Active Hamstring Stretch Limitations  3 way 14" box   Hip Flexor Stretch 20 seconds   Hip Flexor Stretch Limitations 3 way on    Gastroc Stretch 20 seconds   Gastroc Stretch Limitations 3 way   Knee/Hip Exercises: Aerobic   Stationary Bike 8 minutes seat 12->9 full revolutions   Knee/Hip Exercises: Standing   Heel Raises 10 reps   Heel Raises Limitations squat to heelraise with red ball   Knee Flexion AROM;Left;Limitations;15 reps   Knee Flexion Limitations toe resting on 8" box   Forward Lunges Left;10 reps   Forward Lunges Limitations onto 4" step  Side Lunges Left;10 reps   Lateral Step Up 10 reps;Hand Hold: 0;Step Height: 6"   Forward Step Up 10 reps;Hand Hold: 0;Step Height: 6"   Functional Squat 10 reps   Functional Squat Limitations squat walk around to increase hip internal rotation 10x to 6" step    Manual Therapy   Manual Therapy Massage   Massage retro to LT calf   Myofascial Release to anterior inferior Lt knee                  PT Short Term Goals - 07/30/14 1804    PT SHORT TERM GOAL #1   Title Patient will demonstrate increased knee extension to -5 degrees to increased stride length.   Baseline -10   Time 4   Period Weeks   Status New   PT SHORT TERM GOAL #2   Title Patient will demonstrate increased knee flexion of 115 degrees   Baseline 103   Time 4   Period Weeks   Status New   PT SHORT TERM GOAL #3    Title Patient will demonstrate increased Lt hip internal rotation 20 degrees to improve deceleration mechanics.    Baseline Lt: 8, Rt 17   Time 4   Period Weeks   Status New   PT SHORT TERM GOAL #4   Title Patient will demonstrate increased ankle dorsiflexion of 9 degrees   Baseline Lt 3   Time 4   Period Weeks   Status New   PT SHORT TERM GOAL #5   Title Patient will be independent with HEP.    Time 4   Period Weeks   Status New           PT Long Term Goals - 07/30/14 1807    PT LONG TERM GOAL #1   Title Patient will demonstrate increased knee extension to 0 degrees to increased stride length   Time 8   Period Weeks   Status New   PT LONG TERM GOAL #2   Title Patient will demonstrate increased knee flexion of 130 degrees to be able to squat to the ground to work in garden   Time 8   Period Weeks   Status New   PT LONG TERM GOAL #3   Title Patient will demonstrate increased bilateral hip internal rotation 30 degrees to improve deceleration mechanics.    Time 8   PT LONG TERM GOAL #4   Title Patient will demonstrate increased ankle dorsiflexion of 14 degrees   Time 8   Period Weeks   Status New   PT LONG TERM GOAL #5   Title Patient will be able to walk 1 hour without pain.    Time 8   Period Weeks   Status New               Plan - 08/07/14 3846    Clinical Impression Statement Overall reduction of induration, swelling and bruising in distal Lt LE.  Pt reports his Lt calf is no longer hurting like it was.  AROM of 110 degrees, AAROM of 115 degrees today for Lt knee flexion.  Increased to 6" forward and lateral step ups and added lateral lunges to POC today without difficulty.  Completed myofascial release to inferior knee to decrease adhesions as well as friction massage.  PT reported overall decreased stiffness at EOS.     PT Next Visit Plan Continue to increase AROM of Lt knee and progress functional activity.    Consulted and  Agree with Plan of Care  Patient        Problem List Patient Active Problem List   Diagnosis Date Noted  . DJD (degenerative joint disease) of knee 07/14/2014  . Acute upper respiratory infection 06/12/2014  . Cigarette smoker 06/12/2014  . Cough 06/12/2014  . Primary localized osteoarthritis of left knee   . Hyperlipidemia   . GERD (gastroesophageal reflux disease)   . Back pain   . Dyspnea 05/07/2014  . Multiple pulmonary nodules 05/07/2014    Teena Irani, PTA/CLT [phone] 08/07/2014, 9:21 AM  [facility] [address] [place], [place], [address] Phone: [phone]   Fax:  [phone]

## 2014-08-08 ENCOUNTER — Other Ambulatory Visit: Payer: 59

## 2014-08-08 ENCOUNTER — Ambulatory Visit (INDEPENDENT_AMBULATORY_CARE_PROVIDER_SITE_OTHER): Payer: 59 | Admitting: Internal Medicine

## 2014-08-08 ENCOUNTER — Encounter: Payer: Self-pay | Admitting: Internal Medicine

## 2014-08-08 ENCOUNTER — Ambulatory Visit (INDEPENDENT_AMBULATORY_CARE_PROVIDER_SITE_OTHER)
Admission: RE | Admit: 2014-08-08 | Discharge: 2014-08-08 | Disposition: A | Discharge status: Home / Self Care | Payer: 59 | Source: Ambulatory Visit | Attending: Internal Medicine | Admitting: Internal Medicine

## 2014-08-08 VITALS — BP 112/80 | HR 70 | Ht 70.0 in | Wt 226.0 lb

## 2014-08-08 DIAGNOSIS — R918 Other nonspecific abnormal finding of lung field: Secondary | ICD-10-CM

## 2014-08-08 DIAGNOSIS — R0600 Dyspnea, unspecified: Secondary | ICD-10-CM

## 2014-08-08 LAB — PULMONARY FUNCTION TEST
DL/VA % pred: 92 %
DL/VA: 4.27 ml/min/mmHg/L
DLCO unc % pred: 88 %
DLCO unc: 28.51 ml/min/mmHg
FEF 25-75 Post: 5.41 L/sec
FEF 25-75 Pre: 3.58 L/sec
FEF2575-%Change-Post: 51 %
FEF2575-%Pred-Post: 195 %
FEF2575-%Pred-Pre: 129 %
FEV1-%Change-Post: 6 %
FEV1-%Pred-Post: 115 %
FEV1-%Pred-Pre: 108 %
FEV1-Post: 4.01 L
FEV1-Pre: 3.76 L
FEV1FVC-%Change-Post: 5 %
FEV1FVC-%Pred-Pre: 108 %
FEV6-%Change-Post: 0 %
FEV6-%Pred-Post: 104 %
FEV6-%Pred-Pre: 103 %
FEV6-Post: 4.61 L
FEV6-Pre: 4.57 L
FEV6FVC-%Change-Post: 0 %
FEV6FVC-%Pred-Post: 104 %
FEV6FVC-%Pred-Pre: 104 %
FVC-%Change-Post: 0 %
FVC-%Pred-Post: 99 %
FVC-%Pred-Pre: 99 %
FVC-Post: 4.63 L
FVC-Pre: 4.6 L
Post FEV1/FVC ratio: 87 %
Post FEV6/FVC ratio: 100 %
Pre FEV1/FVC ratio: 82 %
Pre FEV6/FVC Ratio: 99 %
RV % pred: 83 %
RV: 1.95 L
TLC % pred: 96 %
TLC: 6.77 L

## 2014-08-08 NOTE — Progress Notes (Signed)
PFT done today. 

## 2014-08-08 NOTE — Patient Instructions (Addendum)
We will place you in tickle file for follow up CT in one year.  You do not have copd, so the key to good breathing  is to stay in shape and keep your weight down

## 2014-08-08 NOTE — Assessment & Plan Note (Signed)
Quit smoking 2014  - 08/08/2014   PFTs nl except for ERV 40%      I reviewed the Flethcher curve with patient that basically indicates  if you quit smoking when your best day FEV1 is still well preserved (as is the case here)  it is highly unlikely you will progress to severe disease and informed the patient there was no medication on the market that has proven to change the curve or the likelihood of progression.  Therefore  maintaining abstinence is the most important aspect of care, not choice of inhalers or for that matter, doctors.    Needs to keep wt down due to low ERV

## 2014-08-08 NOTE — Assessment & Plan Note (Signed)
-   CT chest [facility] 11/13/13  GG nodule 6 mm LUL and MPNs rul x 4 mm - ReCT 04/23/14 LUL x 9 mm GG changes  Quit smoking 2014  CTa 08/08/2014  Interval resolution of the ground-glass opacity seen in the left upper lobe on the previous study. Other numerous bilateral scattered tiny pulmonary nodules are unchanged in the interval. Repeat CT imaging in 12 months could be performed to ensure stability.> in tickle for recall 08/07/15   Results reviewed, radiology guidelines discussed > f/u in one year planned

## 2014-08-08 NOTE — Progress Notes (Signed)
Subjective:     Patient ID: Patrick Mathews, male   DOB: 1951/04/05   MRN: [id number]  Brief patient profile:  64 yowm quit smoking Jan 2015  Able to do HS sports s inhalers then started using inhalers prn in the 1980s no clear pattern and no need for inhalers x 2005  then pna dx around spring 2015   assoc with  fever, aches, cough, eval by Dr Hilma Favors first with cxr ? RLL infiltrate then  CT showing mpns  and pt referred to pulmonary clinic 05/07/2014 by Dr Hilma Favors for f/u nodules and need to be cleared for L TKR.   History of Present Illness  05/07/2014 1st [facility] office visit/ [name]   Chief Complaint  Patient presents with  . Pulmonary Consult    Referred by Dr. Sharilyn Sites for eval of pulmonary nodules. Pt has noticed occ DOE for the past 6 months- gets SOB when playing golf.    new doe since pna x 6 months has not tried any saba. Only sob p 18 holes of golf and really more limited by knee than sob. rec Go ahead with knee surgery and f/u in Feb with pfts/CT    06/12/2014 acute  ov/[name] re:   cough  Chief Complaint  Patient presents with  . Acute Visit    pt c/o cough, nasal congestion, runny nose, stuffy nose, hoarseness.  Acutely ill 12/28 green mucus worse early in am and also blowing it out of nose and hoarse hacking cough but not sob rec depomedrol 120 mg IM today Augmentin 875 mg take one pill twice daily  X 10 days Add pepcid 20 mg at bedtime until surgery > Feb 1st 2016 >>  L TKR went fine    08/08/2014 f/u ov/[name] re: cough gone, no inhalers at all / f/u mpns Chief Complaint  Patient presents with  . Follow-up    PFT done today. Cough has resolved. Pt states breathing is doing well. No new co's today.    breathing is not limiting ex/  sleeping ok resp wise / main time he's sob is bending over at the waste    No obvious day to day or daytime variabilty or assoc cp or chest tightness, subjective wheeze overt sinus or hb symptoms. No unusual exp hx or h/o  childhood pna/ asthma or knowledge of premature birth.  Sleeping ok without nocturnal  or early am exacerbation  of respiratory  c/o's or need for noct saba. Also denies any obvious fluctuation of symptoms with weather or environmental changes or other aggravating or alleviating factors except as outlined above   Current Medications, Allergies, Complete Past Medical History, Past Surgical History, Family History, and Social History were reviewed in [employer] record.  ROS  The following are not active complaints unless bolded sore throat, dysphagia, dental problems, itching, sneezing,  nasal congestion or excess/ purulent secretions, ear ache,   fever, chills, sweats, unintended wt loss, pleuritic or exertional cp, hemoptysis,  orthopnea pnd or leg swelling, presyncope, palpitations, heartburn, abdominal pain, anorexia, nausea, vomiting, diarrhea  or change in bowel or urinary habits, change in stools or urine, dysuria,hematuria,  rash, arthralgias, visual complaints, headache, numbness weakness or ataxia or problems with walking or coordination,  change in mood/affect or memory.                  Objective:   Physical Exam    amb pleasant wm nad   08/08/2014    Wt  Readings from Last 3 Encounters:  06/12/14 223 lb (101.152 kg)  06/05/14 218 lb 1 oz (98.913 kg)  06/04/14 220 lb (99.791 kg)    Vital signs reviewed      HEENT: nl dentition, turbinates, and orophanx. Nl external ear canals without cough reflex   NECK :  without JVD/Nodes/TM/ nl carotid upstrokes bilaterally   LUNGS: no acc muscle use, clear to A and P bilaterally without cough on insp or exp maneuvers   CV:  RRR  no s3 or murmur or increase in P2, no edema   ABD:  soft and nontender with nl excursion in the supine position. No bruits or organomegaly, bowel sounds nl  MS:  warm without deformities, calf tenderness, cyanosis or clubbing  SKIN: warm and dry without lesions    NEURO:   alert, approp, no deficits       images reviewed include:   CTa 08/08/2014  Interval resolution of the ground-glass opacity seen in the left upper lobe on the previous study. Other numerous bilateral scattered tiny pulmonary nodules are unchanged in the interval. Repeat CT imaging in 12 months could be performed to ensure stability.      Assessment:

## 2014-08-12 ENCOUNTER — Ambulatory Visit (HOSPITAL_COMMUNITY): Payer: 59 | Attending: Orthopedic Surgery

## 2014-08-12 DIAGNOSIS — Z471 Aftercare following joint replacement surgery: Secondary | ICD-10-CM | POA: Insufficient documentation

## 2014-08-12 DIAGNOSIS — R262 Difficulty in walking, not elsewhere classified: Secondary | ICD-10-CM

## 2014-08-12 DIAGNOSIS — Z96652 Presence of left artificial knee joint: Secondary | ICD-10-CM | POA: Diagnosis not present

## 2014-08-12 DIAGNOSIS — R269 Unspecified abnormalities of gait and mobility: Secondary | ICD-10-CM

## 2014-08-12 DIAGNOSIS — M25562 Pain in left knee: Secondary | ICD-10-CM

## 2014-08-12 DIAGNOSIS — M25662 Stiffness of left knee, not elsewhere classified: Secondary | ICD-10-CM

## 2014-08-12 NOTE — Therapy (Signed)
[place], [place], [address] Phone: [phone]   Fax:  [phone]  Physical Therapy Treatment  Patient Details  Name: Patrick Mathews MRN: [id number] Date of Birth: 19-Jan-1951 Referring Provider:  Marin Shutter, MD  Encounter Date: 08/12/2014      PT End of Session - 08/12/14 0936    Visit Number 5   Number of Visits 16   Date for PT Re-Evaluation 08/29/14   Authorization Type UHC/Medicare   Authorization - Visit Number 5   Authorization - Number of Visits 16   PT Start Time 0924   PT Stop Time 1018   PT Time Calculation (min) 54 min   Activity Tolerance Patient tolerated treatment well   Behavior During Therapy [facility] for tasks assessed/performed      Past Medical History  Diagnosis Date  . Hyperlipidemia   . GERD (gastroesophageal reflux disease)   . Seasonal allergies   . Asthma   . Exertional dyspnea   . Pneumonia 11/2013  . OSA on CPAP     uses cpap occasionally  . Primary localized osteoarthritis of left knee   . Arthritis     "knees; back" (07/14/2014)    Past Surgical History  Procedure Laterality Date  . Knee arthroscopy Bilateral 1990's  . Bunionectomy Right  2013  . Colonoscopy N/A 11/06/2012    Procedure: COLONOSCOPY;  Surgeon: Jamesetta So, MD;  [facility];  Service: Gastroenterology;  Laterality: N/A;  . 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 Joint replacement    . Tonsillectomy  1950's  . Posterior lumbar fusion  1985; 2009    Dr Joya Salm  . Anterior cervical decomp/discectomy fusion  2009    Dr Joya Salm  . Back surgery    . 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  Visit Diagnosis:  Knee stiffness, left  Difficulty walking  Abnormality of gait  Left knee pain      Subjective  Assessment - 08/12/14 0926    Symptoms Pt stated [facility] days that he does not have therapy.  ROM biggest problem today, difficulty putting on socks and shoes.     Currently in Pain? Yes   Pain Score 3    Pain Location Knee   Pain Orientation Left   Pain Descriptors / Indicators Sore          [facility] PT Assessment - 08/12/14 0001    Assessment   Medical Diagnosis Lt TKA resulting in difficulty walking and stiffness in Lt knee.    Onset Date 07/14/14   Next MD Visit [place]: 08/26/14   Prior Therapy yes HHPT                  [facility] Adult PT Treatment/Exercise - 08/12/14 0934    Exercises   Exercises Knee/Hip   Knee/Hip Exercises: Stretches   Active Hamstring Stretch 3 reps;30 seconds   Active Hamstring Stretch Limitations 14in box 3 direction   Knee: Self-Stretch to increase Flexion Limitations   Knee: Self-Stretch Limitations knee drives on 8in box 3 directinos   Gastroc Stretch 3 reps;30 seconds   Gastroc Stretch Limitations 3 way slant board   Knee/Hip Exercises: Aerobic   Stationary Bike 8 minutes seat 11->9  full revolutions   Knee/Hip Exercises: Standing   Heel Raises 15 reps   Heel Raises Limitations squat to heelraise with red ball   Knee Flexion AROM;Left;Limitations;15 reps   Knee Flexion Limitations toe resting on 8" box   Forward Lunges Both;15 reps   Forward Lunges Limitations onto 4" step   Side Lunges Both;10 reps   Side Lunges Limitations 4in box   Functional Squat 2 sets;10 reps   Functional Squat Limitations squat walk around to increase hip internal rotation 10x to 6" step    Manual Therapy   Manual Therapy Massage   Massage retro to LT calf lateral>medial gastroc   Myofascial Release to anterior inferior Lt knee                  PT Short Term Goals - 08/12/14 0936    PT SHORT TERM GOAL #1   Title Patient will demonstrate increased knee extension to -5 degrees to increased stride length.    Status On-going   PT SHORT TERM GOAL #2   Title Patient will demonstrate increased knee flexion of 115 degrees   Status On-going   PT SHORT TERM GOAL #3   Title Patient will demonstrate increased Lt hip internal rotation 20 degrees to improve deceleration mechanics.    Status On-going   PT SHORT TERM GOAL #4   Title Patient will demonstrate increased ankle dorsiflexion of 9 degrees   Status On-going   PT SHORT TERM GOAL #5   Title Patient will be independent with HEP.    Status On-going           PT Long Term Goals - 08/12/14 4034    PT LONG TERM GOAL #1   Title Patient will demonstrate increased knee extension to 0 degrees to increased stride length   PT LONG TERM GOAL #2   Title Patient will demonstrate increased knee flexion of 130 degrees to be able to squat to the ground to work in garden   PT [place] #3   Title Patient will demonstrate increased bilateral hip internal rotation 30 degrees to improve deceleration mechanics.    PT LONG TERM GOAL #4   Title Patient will demonstrate increased ankle dorsiflexion of 14 degrees   PT LONG TERM GOAL #5   Title Patient will be able to walk 1 hour without pain.                Plan - 08/12/14 1000    Clinical Impression Statement Session focus on improving AROM and functional strengtheing.  Pt able to demonstrate appropriate weight loading and good mechanics with proper lifting today.  Added knee drives to improve knee flexion, and piriformis stretches t oimprove hip rotation to assist with crossing legs to put on socks and shoes.  Pt given piriformis stretch to add to HEP.  Manual techniques complete to reduce tightness and spams on lateral gastroc.  Pt reported pain reduced to 1/10 at end of session.     PT Next Visit Plan Continue to increase AROM of Lt knee and progress functional activity.         Problem List Patient Active Problem List   Diagnosis Date Noted  . DJD (degenerative joint disease) of knee  07/14/2014  . Acute upper respiratory infection 06/12/2014  . Cigarette smoker 06/12/2014  . Cough 06/12/2014  . Primary localized osteoarthritis of left knee   . Hyperlipidemia   . GERD (gastroesophageal reflux disease)   . Back pain   .  Dyspnea 05/07/2014  . Multiple pulmonary nodules 05/07/2014   Ihor Austin, [place]  Aldona Lento 08/12/2014, 10:33 AM  [place], [place], [address] Phone: [phone]   Fax:  [phone]

## 2014-08-12 NOTE — Patient Instructions (Signed)
Hip Extension   Lie on back, legs in air, knees bent. Grasp hands behind one thigh and cross other leg over same thigh. Hold 30 seconds. Repeat with other leg held. Repeat 3  times. Do 1-2 sessions per day.  Copyright  VHI. All rights reserved.

## 2014-08-14 ENCOUNTER — Ambulatory Visit (HOSPITAL_COMMUNITY): Payer: 59 | Admitting: Physical Therapy

## 2014-08-14 DIAGNOSIS — Z471 Aftercare following joint replacement surgery: Secondary | ICD-10-CM | POA: Diagnosis not present

## 2014-08-14 NOTE — Therapy (Signed)
Patrick Mathews, [place], [address] Phone: [phone]   Fax:  [phone]  Physical Therapy Treatment  Patient Details  Name: Patrick Mathews MRN: [id number] Date of Birth: [birth year] Referring Provider:  [name], MD  Encounter Date: 08/14/2014      PT End of Session - 08/14/14 7628    Visit Number 6   Number of Visits 16   Date for PT Re-Evaluation 08/29/14   Authorization Type UHC/Medicare   Authorization - Visit Number 6   Authorization - Number of Visits 16   PT Start Time 1300   PT Stop Time 3151   PT Time Calculation (min) 52 min   Activity Tolerance Patient tolerated treatment well   Behavior During Therapy [facility] for tasks assessed/performed      Past Medical History  Diagnosis Date  . Hyperlipidemia   . GERD (gastroesophageal reflux disease)   . Seasonal allergies   . Asthma   . Exertional dyspnea   . Pneumonia 11/2013  . OSA on CPAP     uses cpap occasionally  . Primary localized osteoarthritis of left knee   . Arthritis     "knees; back" (07/14/2014)    Past Surgical History  Procedure Laterality Date  . Knee arthroscopy Bilateral 1990's  . Bunionectomy Right  2013  . Colonoscopy N/A 11/06/2012    Procedure: COLONOSCOPY;  Surgeon: Jamesetta So, MD;  [facility];  Service: Gastroenterology;  Laterality: N/A;  . 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 Joint replacement    . Tonsillectomy  1950's  . Posterior lumbar fusion  1985; 2009    Dr Joya Salm  . Anterior cervical decomp/discectomy fusion  2009    Dr Joya Salm  . Back surgery    . 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  Visit Diagnosis:  No diagnosis found.      Subjective Assessment - 08/14/14 1312    Symptoms Pt states his Leg is  slowly feeling better.  States he is still having some nerve pain that runs down from his knee to the top of his foot.     Currently in Pain? Yes   Pain Score 3    Pain Location Knee   Pain Orientation Left                    [facility] Adult PT Treatment/Exercise - 08/14/14 1749    Knee/Hip Exercises: Stretches   Active Hamstring Stretch 3 reps;30 seconds   Active Hamstring Stretch Limitations 14in box 3 direction   Knee: Self-Stretch to increase Flexion Limitations   Knee: Self-Stretch Limitations knee drives on 76HY box 3 directinos   Gastroc Stretch 3 reps;30 seconds   Gastroc Stretch Limitations 3 way slant board   Knee/Hip Exercises: Aerobic   Stationary Bike 8 minutes seat 11->9 full revolutions   Knee/Hip Exercises: Standing   Heel Raises 15 reps   Heel Raises Limitations squat to heelraise with red ball   Knee Flexion AROM;Left;Limitations;15 reps   Knee Flexion Limitations toe resting on 8" box   Forward Lunges Both;15 reps   Forward Lunges Limitations onto 4" step   Side Lunges Both;10 reps   Side Lunges Limitations 4in box   Functional Squat 2 sets;10 reps  Functional Squat Limitations squat walk around to increase hip internal rotation 10x to 6" step    Stairs 2RT each 4" and 7" no HHA   Knee/Hip Exercises: Supine   Knee Extension AROM   Knee Extension Limitations 5   Knee Flexion AROM   Knee Flexion Limitations 110 (PROM 115)   Knee/Hip Exercises: Prone   Prone Knee Hang Weights (lbs) with massage to increase extension   Manual Therapy   Manual Therapy Massage   Massage retro to LT calf and posterior knee with prone knee hang to increase extension   Myofascial Release to anterior inferior Lt knee                  PT Short Term Goals - 08/12/14 0936    PT SHORT TERM GOAL #1   Title Patient will demonstrate increased knee extension to -5 degrees to increased stride length.   Status On-going   PT SHORT TERM GOAL #2   Title Patient will  demonstrate increased knee flexion of 115 degrees   Status On-going   PT SHORT TERM GOAL #3   Title Patient will demonstrate increased Lt hip internal rotation 20 degrees to improve deceleration mechanics.    Status On-going   PT SHORT TERM GOAL #4   Title Patient will demonstrate increased ankle dorsiflexion of 9 degrees   Status On-going   PT SHORT TERM GOAL #5   Title Patient will be independent with HEP.    Status On-going           PT Long Term Goals - 08/12/14 1025    PT LONG TERM GOAL #1   Title Patient will demonstrate increased knee extension to 0 degrees to increased stride length   PT LONG TERM GOAL #2   Title Patient will demonstrate increased knee flexion of 130 degrees to be able to squat to the ground to work in garden   PT [place] #3   Title Patient will demonstrate increased bilateral hip internal rotation 30 degrees to improve deceleration mechanics.    PT LONG TERM GOAL #4   Title Patient will demonstrate increased ankle dorsiflexion of 14 degrees   PT LONG TERM GOAL #5   Title Patient will be able to walk 1 hour without pain.                Plan - 08/14/14 1752    Clinical Impression Statement Pt with continued progression achieving AROM of 5-110 today (115 PROM flexion).  Pt instructed with prone knee hang to complete at home.  Pt able to complete stairs reciprocally, 4" and 7" without HHA without dificutly.  Pt Continues to participate in exercise at the gymn in addition to HEP.     PT Next Visit Plan Continue to increase AROM of Lt knee and progress functional activity.         Problem List Patient Active Problem List   Diagnosis Date Noted  . DJD (degenerative joint disease) of knee 07/14/2014  . Acute upper respiratory infection 06/12/2014  . Cigarette smoker 06/12/2014  . Cough 06/12/2014  . Primary localized osteoarthritis of left knee   . Hyperlipidemia   . GERD (gastroesophageal reflux disease)   . Back pain   . Dyspnea  05/07/2014  . Multiple pulmonary nodules 05/07/2014    Patrick Mathews, PTA/CLT [phone] 08/14/2014, 5:55 PM  [place] [address] [place], [place], [address] Phone: [phone]   Fax:  [phone]

## 2014-08-18 ENCOUNTER — Ambulatory Visit (HOSPITAL_COMMUNITY): Payer: 59 | Admitting: Physical Therapy

## 2014-08-18 DIAGNOSIS — R262 Difficulty in walking, not elsewhere classified: Secondary | ICD-10-CM

## 2014-08-18 DIAGNOSIS — M25662 Stiffness of left knee, not elsewhere classified: Secondary | ICD-10-CM

## 2014-08-18 DIAGNOSIS — Z471 Aftercare following joint replacement surgery: Secondary | ICD-10-CM | POA: Diagnosis not present

## 2014-08-18 DIAGNOSIS — M25562 Pain in left knee: Secondary | ICD-10-CM

## 2014-08-18 DIAGNOSIS — R269 Unspecified abnormalities of gait and mobility: Secondary | ICD-10-CM

## 2014-08-18 NOTE — Therapy (Signed)
[place], [place], [address] Phone: [phone]   Fax:  [phone]  Physical Therapy Treatment  Patient Details  Name: Patrick Mathews MRN: [id number] Date of Birth: 15-Mar-1951 Referring Provider:  Elsie Saas, MD  Encounter Date: 08/18/2014      PT End of Session - 08/18/14 1212    Visit Number 7   Number of Visits 16   Date for PT Re-Evaluation 08/29/14   Authorization Type UHC/Medicare   Authorization - Visit Number 7   Authorization - Number of Visits 16   PT Start Time 1020   PT Stop Time 1058   PT Time Calculation (min) 38 min   Activity Tolerance Patient tolerated treatment well   Behavior During Therapy [facility] for tasks assessed/performed      Past Medical History  Diagnosis Date  . Hyperlipidemia   . GERD (gastroesophageal reflux disease)   . Seasonal allergies   . Asthma   . Exertional dyspnea   . Pneumonia 11/2013  . OSA on CPAP     uses cpap occasionally  . Primary localized osteoarthritis of left knee   . Arthritis     "knees; back" (07/14/2014)    Past Surgical History  Procedure Laterality Date  . Knee arthroscopy Bilateral 1990's  . Bunionectomy Right  2013  . Colonoscopy N/A 11/06/2012    Procedure: COLONOSCOPY;  Surgeon: Patrick So, MD;  [facility];  Service: Gastroenterology;  Laterality: N/A;  . 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 Joint replacement    . Tonsillectomy  1950's  . Posterior lumbar fusion  1985; 2009    Dr Patrick Mathews  . Anterior cervical decomp/discectomy fusion  2009    Dr Patrick Mathews  . Back surgery    . 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  Visit Diagnosis:  Knee stiffness, left  Difficulty walking  Abnormality of gait  Left knee pain      Subjective  Assessment - 08/18/14 1021    Symptoms Patient doing very well, states he had a great weekend; looks forward to getting outside and back to golfing in warmer weather. States he is still having more knee stiffness than any pain.    Pertinent History 07/14/14 Lt TKA, multiple lumbar spine surgeries and a cervical spine surgery. Patient lives an active life style plays golf 3 days a weels. History of Rt knee scope. Patient notes Rt knee also has pain and will likely have Rt TKA in future. Patrick Mathews performed surgery. Patient had 5 HHPT viists. Patient is driving.    Currently in Pain? No/denies                    [facility] Adult PT Treatment/Exercise - 08/18/14 0001    Knee/Hip Exercises: Stretches   Active Hamstring Stretch 3 reps;30 seconds   Active Hamstring Stretch Limitations 14in box 3 direction   Quad Stretch 2 reps;30 seconds   Quad Stretch Limitations prone   Piriformis Stretch 2 reps;30 seconds   Piriformis Stretch Limitations seated   Gastroc Stretch 3 reps   Gastroc Stretch Limitations slantboard   Knee/Hip Exercises: Standing   Heel Raises 20 reps   Heel Raises Limitations slantboard   Forward Lunges Both;10 reps;2 sets   Forward Lunges  Limitations 2 inch box   Side Lunges Both;10 reps;2 sets   Side Lunges Limitations 2 inch box   Functional Squat 1 set;10 reps   Functional Squat Limitations BOSU   Other Standing Knee Exercises Hip IR walks around 6 inch box; 1x10 each side   Other Standing Knee Exercises 3D hip excursions split stance 1x10   Knee/Hip Exercises: Supine   Bridges 1 set;15 reps   Knee/Hip Exercises: Sidelying   Hip ABduction Both;1 set;10 reps   Hip ABduction Limitations manual cues for proper performance   Knee/Hip Exercises: Prone   Other [place] Exercises Prone hip extensions 1x10 bilaterally             Balance Exercises - 08/18/14 1054    Balance Exercises: Standing   Tandem Stance Eyes open;Foam/compliant surface;3 reps;10 secs   SLS  Eyes open;3 reps;10 secs;Foam/compliant surface             PT Short Term Goals - 08/12/14 0936    PT SHORT TERM GOAL #1   Title Patient will demonstrate increased knee extension to -5 degrees to increased stride length.   Status On-going   PT SHORT TERM GOAL #2   Title Patient will demonstrate increased knee flexion of 115 degrees   Status On-going   PT SHORT TERM GOAL #3   Title Patient will demonstrate increased Lt hip internal rotation 20 degrees to improve deceleration mechanics.    Status On-going   PT SHORT TERM GOAL #4   Title Patient will demonstrate increased ankle dorsiflexion of 9 degrees   Status On-going   PT SHORT TERM GOAL #5   Title Patient will be independent with HEP.    Status On-going           PT Long Term Goals - 08/12/14 5027    PT LONG TERM GOAL #1   Title Patient will demonstrate increased knee extension to 0 degrees to increased stride length   PT LONG TERM GOAL #2   Title Patient will demonstrate increased knee flexion of 130 degrees to be able to squat to the ground to work in garden   PT [place] #3   Title Patient will demonstrate increased bilateral hip internal rotation 30 degrees to improve deceleration mechanics.    PT LONG TERM GOAL #4   Title Patient will demonstrate increased ankle dorsiflexion of 14 degrees   PT LONG TERM GOAL #5   Title Patient will be able to walk 1 hour without pain.                Plan - 08/18/14 1213    Clinical Impression Statement Patient is progressing well and responded well to progression of exercises at this time. No pain, stiffness is primary complaint this morning. Able to complete high number of repetitions of lunges and other exercises without difficulty at this time. Continues to go to gym in addtion to HEP and therapy. However some weakness noted in hip extensor groups during gravity inclusive exercises. Difficulty with SLS on foam.    Pt will benefit from skilled therapeutic  intervention in order to improve on the following deficits Abnormal gait;Decreased strength;Pain;Difficulty walking;Decreased activity tolerance;Decreased range of motion;Impaired flexibility;Decreased endurance   Rehab Potential Good   PT Frequency 2x / week   PT Duration 8 weeks   PT Treatment/Interventions Therapeutic exercise;Balance training;Patient/family education;Manual techniques;Therapeutic activities   PT Next Visit Plan Functional stretches, functional strengthening including prone hip extensors and sidelying abductors; high level balance; continue to  progress strengthening   Consulted and Agree with Plan of Care Patient        Problem List Patient Active Problem List   Diagnosis Date Noted  . DJD (degenerative joint disease) of knee 07/14/2014  . Acute upper respiratory infection 06/12/2014  . Cigarette smoker 06/12/2014  . Cough 06/12/2014  . Primary localized osteoarthritis of left knee   . Hyperlipidemia   . GERD (gastroesophageal reflux disease)   . Back pain   . Dyspnea 05/07/2014  . Multiple pulmonary nodules 05/07/2014    Deniece Ree PT, DPT [facility] [address] [place], [place], [address] Phone: [phone]   Fax:  [phone]

## 2014-08-20 ENCOUNTER — Ambulatory Visit (HOSPITAL_COMMUNITY): Payer: 59 | Admitting: Physical Therapy

## 2014-08-20 DIAGNOSIS — M25662 Stiffness of left knee, not elsewhere classified: Secondary | ICD-10-CM

## 2014-08-20 DIAGNOSIS — M25562 Pain in left knee: Secondary | ICD-10-CM

## 2014-08-20 DIAGNOSIS — Z471 Aftercare following joint replacement surgery: Secondary | ICD-10-CM | POA: Diagnosis not present

## 2014-08-20 DIAGNOSIS — R269 Unspecified abnormalities of gait and mobility: Secondary | ICD-10-CM

## 2014-08-20 DIAGNOSIS — R262 Difficulty in walking, not elsewhere classified: Secondary | ICD-10-CM

## 2014-08-20 NOTE — Therapy (Signed)
[place], [place], [address] Phone: [phone]   Fax:  [phone]  Physical Therapy Treatment  Patient Details  Name: Patrick Mathews MRN: [id number] Date of Birth: [birth year] Referring Provider:  Elsie Saas, MD  Encounter Date: 08/20/2014      PT End of Session - 08/20/14 1050    Visit Number 8   Number of Visits 16   Date for PT Re-Evaluation 08/29/14   Authorization Type UHC/Medicare   Authorization - Visit Number 8   Authorization - Number of Visits 16   PT Start Time 1016   PT Stop Time 1100   PT Time Calculation (min) 44 min   Activity Tolerance Patient tolerated treatment well   Behavior During Therapy [facility] for tasks assessed/performed      Past Medical History  Diagnosis Date  . Hyperlipidemia   . GERD (gastroesophageal reflux disease)   . Seasonal allergies   . Asthma   . Exertional dyspnea   . Pneumonia 11/2013  . OSA on CPAP     uses cpap occasionally  . Primary localized osteoarthritis of left knee   . Arthritis     "knees; back" (07/14/2014)    Past Surgical History  Procedure Laterality Date  . Knee arthroscopy Bilateral 1990's  . Bunionectomy Right  2013  . Colonoscopy N/A 11/06/2012    Procedure: COLONOSCOPY;  Surgeon: Jamesetta So, MD;  [facility];  Service: Gastroenterology;  Laterality: N/A;  . 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 Joint replacement    . Tonsillectomy  1950's  . Posterior lumbar fusion  1985; 2009    Dr Joya Salm  . Anterior cervical decomp/discectomy fusion  2009    Dr Joya Salm  . Back surgery    . 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  Visit Diagnosis:  Knee stiffness, left  Difficulty walking  Abnormality of gait  Left knee pain      Subjective  Assessment - 08/20/14 1015    Symptoms Patint statses he is doing well and has been chipping the golf balls in his back yard. No pain noted today, just stiffness in Lt knee. Consumes pain medication only at night now, but last night did not take any and had a restless night.    Currently in Pain? No/denies          [facility] PT Assessment - 08/20/14 0001    Assessment   Medical Diagnosis Lt TKA resulting in difficulty walking and stiffness in Lt knee.    Onset Date 07/14/14   Next MD Visit [place]: 08/26/14   Prior Therapy yes HHPT   Observation/Other Assessments   Focus on Therapeutic Outcomes (FOTO)  29% limited   AROM   Right Hip External Rotation  55   Right Hip Internal Rotation  23   Left Hip External Rotation  33   Left Hip Internal Rotation  23   Left Knee Extension -4   Left Knee Flexion 117   Right Ankle Dorsiflexion 20   Left Ankle Dorsiflexion 10   Strength   Right Hip Flexion 5/5   Left Hip Flexion 5/5   Right Knee Flexion 5/5   Right Knee Extension 5/5   Left Knee Flexion 5/5   Left Knee Extension 5/5  Left Ankle Dorsiflexion 5/5                  [facility] Adult PT Treatment/Exercise - 08/20/14 0001    Knee/Hip Exercises: Stretches   Active Hamstring Stretch 3 reps;30 seconds   Active Hamstring Stretch Limitations 14in box 3 direction   Quad Stretch 4 reps;20 seconds   Quad Stretch Limitations prone   Piriformis Stretch 2 reps;30 seconds   Piriformis Stretch Limitations seated   Gastroc Stretch 3 reps   Gastroc Stretch Limitations slantboard   Knee/Hip Exercises: Standing   Heel Raises 20 reps   Heel Raises Limitations slantboard   Forward Lunges Both;10 reps;2 sets   Forward Lunges Limitations to floor   Side Lunges Both;10 reps;2 sets   Side Lunges Limitations 2 inch box   Functional Squat 2 sets;10 reps   Functional Squat Limitations squat walk around to increase hip internal rotation 10x to 6" step    Other Standing Knee Exercises UE dowel  pendulems with cuing on hips for proper technique to improve golf swing. 10x   Other Standing Knee Exercises 3D hip excursions split stance 1x10   Knee/Hip Exercises: Supine   Quad Sets 1 set;Left;10 reps                  PT Short Term Goals - 08/20/14 1059    PT SHORT TERM GOAL #1   Title Patient will demonstrate increased knee extension to -5 degrees to increased stride length.   Status Achieved   PT SHORT TERM GOAL #2   Title Patient will demonstrate increased knee flexion of 115 degrees   Status Achieved   PT SHORT TERM GOAL #3   Title Patient will demonstrate increased Lt hip internal rotation 20 degrees to improve deceleration mechanics.    Status Achieved   PT SHORT TERM GOAL #4   Title Patient will demonstrate increased ankle dorsiflexion of 9 degrees   Status Achieved   PT SHORT TERM GOAL #5   Title Patient will be independent with HEP.    Status New           PT Long Term Goals - 08/20/14 1059    PT LONG TERM GOAL #1   Title Patient will demonstrate increased knee extension to 0 degrees to increased stride length   Status On-going   PT LONG TERM GOAL #2   Title Patient will demonstrate increased knee flexion of 130 degrees to be able to squat to the ground to work in garden   Status On-going   PT [place] #3   Title Patient will demonstrate increased bilateral hip internal rotation 30 degrees to improve deceleration mechanics.    Status On-going   PT LONG TERM GOAL #4   Title Patient will demonstrate increased ankle dorsiflexion of 14 degrees   Status On-going   PT LONG TERM GOAL #5   Title Patient will be able to walk 1 hour without pain.    Status On-going               Plan - 08/20/14 1102    Clinical Impression Statement Patient is progressing well with all ROm and MMT measurments improving in addition to patient's gait mechanics. patient displasy decreased pain and good activity toelrance with independence with HEP. Ptient has met  all short term goals./ Continue physical therapy 2x a week for 4 more weeks to achieve long term goals.    PT Next Visit Plan Continue to increase flexion and  Extension ROm and improve gait mechanics.         Problem List Patient Active Problem List   Diagnosis Date Noted  . DJD (degenerative joint disease) of knee 07/14/2014  . Acute upper respiratory infection 06/12/2014  . Cigarette smoker 06/12/2014  . Cough 06/12/2014  . Primary localized osteoarthritis of left knee   . Hyperlipidemia   . GERD (gastroesophageal reflux disease)   . Back pain   . Dyspnea 05/07/2014  . Multiple pulmonary nodules 05/07/2014   Devona Konig PT DPT [place], [place], [address] Phone: [phone]   Fax:  [phone]

## 2014-08-25 ENCOUNTER — Encounter (HOSPITAL_COMMUNITY): Payer: 59 | Admitting: Physical Therapy

## 2014-08-26 ENCOUNTER — Encounter (HOSPITAL_COMMUNITY): Payer: Self-pay

## 2014-08-27 ENCOUNTER — Ambulatory Visit (HOSPITAL_COMMUNITY): Payer: 59 | Admitting: Physical Therapy

## 2014-08-27 DIAGNOSIS — R262 Difficulty in walking, not elsewhere classified: Secondary | ICD-10-CM

## 2014-08-27 DIAGNOSIS — Z471 Aftercare following joint replacement surgery: Secondary | ICD-10-CM | POA: Diagnosis not present

## 2014-08-27 DIAGNOSIS — M25662 Stiffness of left knee, not elsewhere classified: Secondary | ICD-10-CM

## 2014-08-27 DIAGNOSIS — R269 Unspecified abnormalities of gait and mobility: Secondary | ICD-10-CM

## 2014-08-27 NOTE — Therapy (Signed)
[place], [place], [address] Phone: [phone]   Fax:  [phone]  Physical Therapy Treatment  Patient Details  Name: Patrick Mathews MRN: [id number] Date of Birth: 08/20/1950 Referring Provider:  Elsie Saas, MD  Encounter Date: 08/27/2014      PT End of Session - 08/27/14 1053    Visit Number 9   Number of Visits 16   Date for PT Re-Evaluation 08/29/14   Authorization Type UHC/Medicare   Authorization - Visit Number 9   Authorization - Number of Visits 16   PT Start Time 1950   PT Stop Time 1100   PT Time Calculation (min) 42 min   Activity Tolerance Patient tolerated treatment well   Behavior During Therapy [facility] for tasks assessed/performed      Past Medical History  Diagnosis Date  . Hyperlipidemia   . GERD (gastroesophageal reflux disease)   . Seasonal allergies   . Asthma   . Exertional dyspnea   . Pneumonia 11/2013  . OSA on CPAP     uses cpap occasionally  . Primary localized osteoarthritis of left knee   . Arthritis     "knees; back" (07/14/2014)    Past Surgical History  Procedure Laterality Date  . Knee arthroscopy Bilateral 1990's  . Bunionectomy Right  2013  . Colonoscopy N/A 11/06/2012    Procedure: COLONOSCOPY;  Surgeon: Jamesetta So, MD;  [facility];  Service: Gastroenterology;  Laterality: N/A;  . 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 Joint replacement    . Tonsillectomy  1950's  . Posterior lumbar fusion  1985; 2009    Dr Joya Salm  . Anterior cervical decomp/discectomy fusion  2009    Dr Joya Salm  . Back surgery    . 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  Visit Diagnosis:  Knee stiffness, left  Difficulty walking  Abnormality of gait      Subjective Assessment -  08/27/14 1021    Symptoms PPPatient states havig check up with MD which went very well, they are happy with his progress thus far.    Currently in Pain? No/denies            [facility] PT Assessment - 08/27/14 0001    Assessment   Medical Diagnosis Lt TKA resulting in difficulty walking and stiffness in Lt knee.    Onset Date 07/14/14   Next MD Visit [place]: 08/26/14   Prior Therapy yes HHPT   AROM   Left Knee Extension -2   Left Knee Flexion 117           [facility] Adult PT Treatment/Exercise - 08/27/14 0001    Knee/Hip Exercises: Stretches   Active Hamstring Stretch 3 reps;30 seconds   Active Hamstring Stretch Limitations 14in box 3 direction   Hip Flexor Stretch Limitations knee drives on 93OI box 71I 3 seconds 3 sets with therqapis assist for straightening Lt leg   Knee: Self-Stretch to increase Flexion Limitations   Knee: Self-Stretch Limitations knee drives on 45YK box 99I 3 seconds 3 sets   Piriformis Stretch 2 reps;30 seconds   Piriformis Stretch Limitations seated   Gastroc Stretch 3 reps   Gastroc Stretch Limitations slantboard   Knee/Hip Exercises: Standing   Heel Raises 20 reps   Heel  Raises Limitations heel toe   Forward Lunges Both;10 reps;2 sets   Forward Lunges Limitations to floor   Side Lunges Both;10 reps;2 sets   Side Lunges Limitations floor   Forward Step Up Step Height: 6";Hand Hold: 0;10 reps;Step Height: 8"   Forward Step Up Limitations 1 set at each height   Step Down Step Height: 2";5 reps   Step Down Limitations Single leg balance reach    Functional Squat 2 sets;10 reps   Functional Squat Limitations squat walk around to increase hip internal rotation 10x to 6" step    Other Standing Knee Exercises 3D hip excursions split stance 1x10           PT Short Term Goals - 08/20/14 1059    PT SHORT TERM GOAL #1   Title Patient will demonstrate increased knee extension to -5 degrees to increased stride length.   Status Achieved   PT SHORT TERM GOAL  #2   Title Patient will demonstrate increased knee flexion of 115 degrees   Status Achieved   PT SHORT TERM GOAL #3   Title Patient will demonstrate increased Lt hip internal rotation 20 degrees to improve deceleration mechanics.    Status Achieved   PT SHORT TERM GOAL #4   Title Patient will demonstrate increased ankle dorsiflexion of 9 degrees   Status Achieved   PT SHORT TERM GOAL #5   Title Patient will be independent with HEP.    Status New           PT Long Term Goals - 08/20/14 1059    PT LONG TERM GOAL #1   Title Patient will demonstrate increased knee extension to 0 degrees to increased stride length   Status On-going   PT LONG TERM GOAL #2   Title Patient will demonstrate increased knee flexion of 130 degrees to be able to squat to the ground to work in garden   Status On-going   PT [place] #3   Title Patient will demonstrate increased bilateral hip internal rotation 30 degrees to improve deceleration mechanics.    Status On-going   PT LONG TERM GOAL #4   Title Patient will demonstrate increased ankle dorsiflexion of 14 degrees   Status On-going   PT LONG TERM GOAL #5   Title Patient will be able to walk 1 hour without pain.    Status On-going           Plan - 08/27/14 1054    Clinical Impression Statement patient dispalsy improvign though still limioted knee extension and flexion. patient has good strength displying only mild idfficulty with setp doewns but able to perfom all exercises equally on bilateral LE for strengthieng.    PT Next Visit Plan Continue to increase flexion and Extension ROM for improving gait.         Problem List Patient Active Problem List   Diagnosis Date Noted  . DJD (degenerative joint disease) of knee 07/14/2014  . Acute upper respiratory infection 06/12/2014  . Cigarette smoker 06/12/2014  . Cough 06/12/2014  . Primary localized osteoarthritis of left knee   . Hyperlipidemia   . GERD (gastroesophageal reflux  disease)   . Back pain   . Dyspnea 05/07/2014  . Multiple pulmonary nodules 05/07/2014   Devona Konig PT DPT [place], [place], [address] Phone: [phone]   Fax:  [phone]

## 2014-08-28 ENCOUNTER — Ambulatory Visit (HOSPITAL_COMMUNITY): Payer: 59

## 2014-08-29 ENCOUNTER — Encounter (HOSPITAL_COMMUNITY): Payer: Self-pay

## 2014-09-01 ENCOUNTER — Ambulatory Visit (HOSPITAL_COMMUNITY): Payer: 59 | Admitting: Physical Therapy

## 2014-09-01 DIAGNOSIS — M25562 Pain in left knee: Secondary | ICD-10-CM

## 2014-09-01 DIAGNOSIS — R262 Difficulty in walking, not elsewhere classified: Secondary | ICD-10-CM

## 2014-09-01 DIAGNOSIS — M25662 Stiffness of left knee, not elsewhere classified: Secondary | ICD-10-CM

## 2014-09-01 DIAGNOSIS — Z471 Aftercare following joint replacement surgery: Secondary | ICD-10-CM | POA: Diagnosis not present

## 2014-09-01 DIAGNOSIS — R269 Unspecified abnormalities of gait and mobility: Secondary | ICD-10-CM

## 2014-09-01 NOTE — Therapy (Signed)
[place], [place], [address] Phone: [phone]   Fax:  [phone]  Physical Therapy Treatment  Patient Details  Name: Patrick Mathews MRN: [id number] Date of Birth: 05/17/1951 Referring Provider:  Elsie Saas, MD  Encounter Date: 09/01/2014      PT End of Session - 09/01/14 1156    Visit Number 10   Number of Visits 16   Date for PT Re-Evaluation 09/28/15   Authorization Type UHC/Medicare   Authorization - Visit Number 10   Authorization - Number of Visits 16   Activity Tolerance Patient tolerated treatment well   Behavior During Therapy [facility] for tasks assessed/performed      Past Medical History  Diagnosis Date  . Hyperlipidemia   . GERD (gastroesophageal reflux disease)   . Seasonal allergies   . Asthma   . Exertional dyspnea   . Pneumonia 11/2013  . OSA on CPAP     uses cpap occasionally  . Primary localized osteoarthritis of left knee   . Arthritis     "knees; back" (07/14/2014)    Past Surgical History  Procedure Laterality Date  . Knee arthroscopy Bilateral 1990's  . Bunionectomy Right  2013  . Colonoscopy N/A 11/06/2012    Procedure: COLONOSCOPY;  Surgeon: Jamesetta So, MD;  [facility];  Service: Gastroenterology;  Laterality: N/A;  . 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 Joint replacement    . Tonsillectomy  1950's  . Posterior lumbar fusion  1985; 2009    Dr Joya Salm  . Anterior cervical decomp/discectomy fusion  2009    Dr Joya Salm  . Back surgery    . 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  Visit Diagnosis:  Knee stiffness, left  Difficulty walking  Abnormality of gait  Left knee pain      Subjective Assessment - 09/01/14 1029    Symptoms Patient states he is doing well, a  bit of stiffness in his left knee but otherwise doing very well today   Pertinent History 07/14/14 Lt TKA, multiple lumbar spine surgeries and a cervical spine surgery. Patient lives an active life style plays golf 3 days a weels. History of Rt knee scope. Patient notes Rt knee also has pain and will likely have Rt TKA in future. [name] performed surgery. Patient had 5 HHPT viists. Patient is driving.    Currently in Pain? No/denies                       [facility] Adult PT Treatment/Exercise - 09/01/14 0001    Knee/Hip Exercises: Stretches   Active Hamstring Stretch 3 reps;30 seconds   Active Hamstring Stretch Limitations 14in box 3 direction   Quad Stretch 2 reps;30 seconds   Quad Stretch Limitations prone   Piriformis Stretch 2 reps;30 seconds   Piriformis Stretch Limitations seated   Gastroc Stretch 3 reps;30 seconds   Gastroc Stretch Limitations slantboard, 3 way   Knee/Hip Exercises: Standing   Heel Raises 1 set;20 reps   Heel Raises Limitations sitting up to toes with overhead press yellow ball   Forward Lunges Both;1 set;10 reps   Forward Lunges Limitations 3 way to floor   Forward Step Up Both;1 set;15 reps   Forward Step Up Limitations  8 inch step   Step Down Both;1 set;10 reps   Step Down Limitations Star reaches 1x10, foam pad   Other Standing Knee Exercises Forward knee drives 8 inch box 9E17 with 3 second holds   Other Standing Knee Exercises 3D hip excursions 1x10; hip IR box walks 1x10              Balance Exercises - September 26, 2014 1153    Balance Exercises: Standing   Tandem Stance Eyes open;Foam/compliant surface;3 reps;20 secs  BOSU   SLS Eyes open;Foam/compliant surface;3 reps;10 secs  BOSU             PT Short Term Goals - 08/20/14 1059    PT SHORT TERM GOAL #1   Title Patient will demonstrate increased knee extension to -5 degrees to increased stride length.   Status Achieved   PT SHORT TERM GOAL #2   Title Patient will demonstrate  increased knee flexion of 115 degrees   Status Achieved   PT SHORT TERM GOAL #3   Title Patient will demonstrate increased Lt hip internal rotation 20 degrees to improve deceleration mechanics.    Status Achieved   PT SHORT TERM GOAL #4   Title Patient will demonstrate increased ankle dorsiflexion of 9 degrees   Status Achieved   PT SHORT TERM GOAL #5   Title Patient will be independent with HEP.    Status New           PT Long Term Goals - 08/20/14 1059    PT LONG TERM GOAL #1   Title Patient will demonstrate increased knee extension to 0 degrees to increased stride length   Status On-going   PT LONG TERM GOAL #2   Title Patient will demonstrate increased knee flexion of 130 degrees to be able to squat to the ground to work in garden   Status On-going   PT [place] #3   Title Patient will demonstrate increased bilateral hip internal rotation 30 degrees to improve deceleration mechanics.    Status On-going   PT LONG TERM GOAL #4   Title Patient will demonstrate increased ankle dorsiflexion of 14 degrees   Status On-going   PT LONG TERM GOAL #5   Title Patient will be able to walk 1 hour without pain.    Status On-going               Plan - September 26, 2014 1156    Clinical Impression Statement Patient tolerated session today very well; focused on functional stretches and exercises along with tasks for improved knee flexion and extension. Continues to have difficulty with step down on this date.   Pt will benefit from skilled therapeutic intervention in order to improve on the following deficits Abnormal gait;Decreased strength;Pain;Difficulty walking;Decreased activity tolerance;Decreased range of motion;Impaired flexibility;Decreased endurance   Rehab Potential Good   PT Frequency 2x / week   PT Duration 8 weeks   PT Treatment/Interventions Therapeutic exercise;Balance training;Patient/family education;Manual techniques;Therapeutic activities   PT Next Visit Plan  Continue to increase flexion and Extension ROM for improving gait. Stair training. Step downs.    PT Home Exercise Plan hamstring, hip flexor, and calf 3 way stretches.    Consulted and Agree with Plan of Care Patient          G-Codes - 09/26/14 1157    Functional Assessment Tool Used Skilled clinical assessment and observation   Functional Limitation Mobility: Walking and moving around   Mobility: Walking and Moving Around Current Status [phone])  At least 20 percent but less than 40 percent impaired, limited or restricted   Mobility: Walking and Moving Around Goal Status [phone]) At least 1 percent but less than 20 percent impaired, limited or restricted      Problem List Patient Active Problem List   Diagnosis Date Noted  . DJD (degenerative joint disease) of knee 07/14/2014  . Acute upper respiratory infection 06/12/2014  . Cigarette smoker 06/12/2014  . Cough 06/12/2014  . Primary localized osteoarthritis of left knee   . Hyperlipidemia   . GERD (gastroesophageal reflux disease)   . Back pain   . Dyspnea 05/07/2014  . Multiple pulmonary nodules 05/07/2014    Deniece Ree PT, DPT [place] [address] [place], [place], [address] Phone: [phone]   Fax:  [phone]

## 2014-09-03 ENCOUNTER — Ambulatory Visit (HOSPITAL_COMMUNITY): Payer: 59 | Admitting: Physical Therapy

## 2014-09-03 DIAGNOSIS — M25662 Stiffness of left knee, not elsewhere classified: Secondary | ICD-10-CM

## 2014-09-03 DIAGNOSIS — R262 Difficulty in walking, not elsewhere classified: Secondary | ICD-10-CM

## 2014-09-03 DIAGNOSIS — Z471 Aftercare following joint replacement surgery: Secondary | ICD-10-CM | POA: Diagnosis not present

## 2014-09-03 DIAGNOSIS — R269 Unspecified abnormalities of gait and mobility: Secondary | ICD-10-CM

## 2014-09-03 DIAGNOSIS — M25562 Pain in left knee: Secondary | ICD-10-CM

## 2014-09-03 NOTE — Therapy (Addendum)
[place], [place], [address] Phone: [phone]   Fax:  [phone]  Physical Therapy Treatment  Patient Details  Name: Patrick Mathews MRN: [id number] Date of Birth: 26-Sep-1950 Referring Provider:  Elsie Saas, MD  Encounter Date: 09/03/2014      PT End of Session - 09/03/14 1207    Visit Number 11   Number of Visits 16   Date for PT Re-Evaluation 09/28/15   Authorization Type UHC/Medicare   Authorization - Visit Number 11   Authorization - Number of Visits 16   PT Start Time 6440   PT Stop Time 1108   PT Time Calculation (min) 40 min   Activity Tolerance Patient tolerated treatment well   Behavior During Therapy [facility] for tasks assessed/performed      Past Medical History  Diagnosis Date  . Hyperlipidemia   . GERD (gastroesophageal reflux disease)   . Seasonal allergies   . Asthma   . Exertional dyspnea   . Pneumonia 11/2013  . OSA on CPAP     uses cpap occasionally  . Primary localized osteoarthritis of left knee   . Arthritis     "knees; back" (07/14/2014)    Past Surgical History  Procedure Laterality Date  . Knee arthroscopy Bilateral 1990's  . Bunionectomy Right  2013  . Colonoscopy N/A 11/06/2012    Procedure: COLONOSCOPY;  Surgeon: Jamesetta So, MD;  [facility];  Service: Gastroenterology;  Laterality: N/A;  . 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 Joint replacement    . Tonsillectomy  1950's  . Posterior lumbar fusion  1985; 2009    Dr Joya Salm  . Anterior cervical decomp/discectomy fusion  2009    Dr Joya Salm  . Back surgery    . 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  Visit Diagnosis:  Knee stiffness, left  Difficulty walking  Abnormality of gait  Left knee pain       Subjective Assessment - 09/03/14 1028    Symptoms Pt state he is down to one pill a night.  Pt only difficulty is going down steps at this time   Currently in Pain? No/denies           [facility] Adult PT Treatment/Exercise - 09/03/14 0001    Knee/Hip Exercises: Stretches   Active Hamstring Stretch 3 reps;30 seconds   Active Hamstring Stretch Limitations 17" box    Quad Stretch 3 reps;30 seconds   Piriformis Stretch 2 reps;30 seconds   Piriformis Stretch Limitations seated   Gastroc Stretch 3 reps;30 seconds   Gastroc Stretch Limitations slantboard, 3 way   Knee/Hip Exercises: Standing   Heel Raises 15 reps   Heel Raises Limitations lifting blue ball up then place on 6" step for functional squt    Lateral Step Up Left;15 reps;Hand Hold: 0;Step Height: 8"   Forward Step Up Left;15 reps;Hand Hold: 0;Step Height: 8"   Functional Squat 15 reps   Rocker Board 2 minutes   SLS with Vectors 10" x 3    Other Standing Knee Exercises Forward knee drives 8 inch box [id number] with 3 second holds   Other Standing Knee Exercises dowel 3# for improved rotation for golf activity    Knee/Hip Exercises: Supine   Quad Sets Left;10 reps  Terminal Knee Extension Left;10 reps   Knee Extension PROM   Knee Extension Limitations 5   Knee/Hip Exercises: Prone   Contract/Relax to Increase Flexion x5            PT Short Term Goals - 08/20/14 1059    PT SHORT TERM GOAL #1   Title Patient will demonstrate increased knee extension to -5 degrees to increased stride length.   Status Achieved   PT SHORT TERM GOAL #2   Title Patient will demonstrate increased knee flexion of 115 degrees   Status Achieved   PT SHORT TERM GOAL #3   Title Patient will demonstrate increased Lt hip internal rotation 20 degrees to improve deceleration mechanics.    Status Achieved   PT SHORT TERM GOAL #4   Title Patient will demonstrate increased ankle dorsiflexion of 9 degrees   Status Achieved   PT SHORT TERM GOAL #5   Title  Patient will be independent with HEP.    Status Achieved           PT Long Term Goals - 08/20/14 1059    PT LONG TERM GOAL #1   Title Patient will demonstrate increased knee extension to 0 degrees to increased stride length   Status On-going   PT LONG TERM GOAL #2   Title Patient will demonstrate increased knee flexion of 130 degrees to be able to squat to the ground to work in garden   Status On-going   PT [place] #3   Title Patient will demonstrate increased bilateral hip internal rotation 30 degrees to improve deceleration mechanics.    Status On-going   PT LONG TERM GOAL #4   Title Patient will demonstrate increased ankle dorsiflexion of 14 degrees   Status On-going   PT LONG TERM GOAL #5   Title Patient will be able to walk 1 hour without pain.    Status On-going               Plan - 09/03/14 1208    Clinical Impression Statement Today treatment focused on functional activity (steps and golf swing) as well as balance.  Pt ROM imporoved significantly currently at 5-120.  Pt continues to have slight decreased control with eccentric quadricep contraction.    PT Next Visit Plan continue to focus on balance, eccentric control of quadricep as well as improving extension.         Problem List Patient Active Problem List   Diagnosis Date Noted  . DJD (degenerative joint disease) of knee 07/14/2014  . Acute upper respiratory infection 06/12/2014  . Cigarette smoker 06/12/2014  . Cough 06/12/2014  . Primary localized osteoarthritis of left knee   . Hyperlipidemia   . GERD (gastroesophageal reflux disease)   . Back pain   . Dyspnea 05/07/2014  . Multiple pulmonary nodules 05/07/2014    Rayetta Humphrey PT [phone]  09/03/2014, 12:11 PM  [place] [address] [place], [place], [address] Phone: [phone]   Fax:  [phone]

## 2014-09-08 ENCOUNTER — Encounter (HOSPITAL_COMMUNITY): Payer: 59 | Admitting: Physical Therapy

## 2014-09-10 ENCOUNTER — Encounter (HOSPITAL_COMMUNITY): Payer: 59 | Admitting: Physical Therapy

## 2014-09-15 ENCOUNTER — Ambulatory Visit (HOSPITAL_COMMUNITY): Payer: 59 | Attending: Orthopedic Surgery | Admitting: Physical Therapy

## 2014-09-15 DIAGNOSIS — R269 Unspecified abnormalities of gait and mobility: Secondary | ICD-10-CM

## 2014-09-15 DIAGNOSIS — Z471 Aftercare following joint replacement surgery: Secondary | ICD-10-CM | POA: Diagnosis not present

## 2014-09-15 DIAGNOSIS — M25562 Pain in left knee: Secondary | ICD-10-CM

## 2014-09-15 DIAGNOSIS — Z96652 Presence of left artificial knee joint: Secondary | ICD-10-CM | POA: Insufficient documentation

## 2014-09-15 DIAGNOSIS — M25662 Stiffness of left knee, not elsewhere classified: Secondary | ICD-10-CM

## 2014-09-15 DIAGNOSIS — R262 Difficulty in walking, not elsewhere classified: Secondary | ICD-10-CM

## 2014-09-15 NOTE — Therapy (Signed)
[place], [place], [address] Phone: [phone]   Fax:  [phone]  Physical Therapy Treatment  Patient Details  Name: Patrick Mathews MRN: [id number] Date of Birth: 29-Aug-1950 Referring Provider:  Elsie Saas, MD  Encounter Date: 09/15/2014      PT End of Session - 09/15/14 1107    Visit Number 12   Number of Visits 16   Date for PT Re-Evaluation 09/28/15   Authorization Type UHC/Medicare   Authorization - Visit Number 12   Authorization - Number of Visits 16   PT Start Time 1016   PT Stop Time 1102   PT Time Calculation (min) 46 min   Activity Tolerance Patient tolerated treatment well   Behavior During Therapy [facility] for tasks assessed/performed      Past Medical History  Diagnosis Date  . Hyperlipidemia   . GERD (gastroesophageal reflux disease)   . Seasonal allergies   . Asthma   . Exertional dyspnea   . Pneumonia 11/2013  . OSA on CPAP     uses cpap occasionally  . Primary localized osteoarthritis of left knee   . Arthritis     "knees; back" (07/14/2014)    Past Surgical History  Procedure Laterality Date  . Knee arthroscopy Bilateral 1990's  . Bunionectomy Right  2013  . Colonoscopy N/A 11/06/2012    Procedure: COLONOSCOPY;  Surgeon: Jamesetta So, MD;  [facility];  Service: Gastroenterology;  Laterality: N/A;  . 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 Joint replacement    . Tonsillectomy  1950's  . Posterior lumbar fusion  1985; 2009    Dr Joya Salm  . Anterior cervical decomp/discectomy fusion  2009    Dr Joya Salm  . Back surgery    . 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  Visit Diagnosis:  Knee stiffness, left  Difficulty walking  Abnormality of gait  Left knee pain      Subjective  Assessment - 09/15/14 1032    Subjective Patient states he is doing well, went to the beach this weekend and bought a bike. Still some trouble with going down steps but it is getting better. Says he really has trouble with standing on one leg to put on jeans in the morning though.    Pertinent History 07/14/14 Lt TKA, multiple lumbar spine surgeries and a cervical spine surgery. Patient lives an active life style plays golf 3 days a weels. History of Rt knee scope. Patient notes Rt knee also has pain and will likely have Rt TKA in future. [name] performed surgery. Patient had 5 HHPT viists. Patient is driving.    Currently in Pain? No/denies                       [facility] Adult PT Treatment/Exercise - 09/15/14 0001    Knee/Hip Exercises: Stretches   Active Hamstring Stretch 3 reps;30 seconds   Active Hamstring Stretch Limitations 14 inch box   Quad Stretch 3 reps;30 seconds   Quad Stretch Limitations prone   Piriformis Stretch 3 reps;30 seconds   Piriformis Stretch Limitations seated   Gastroc Stretch 3 reps;30 seconds   Gastroc Stretch Limitations slantboard, 3 way   Knee/Hip Exercises: Standing   Forward Lunges Both;1  set;10 reps   Forward Lunges Limitations 4-way to floor   Step Down Both;1 set;15 reps   Step Down Limitations 6 inch step with focus on slow eccentric lower    Other Standing Knee Exercises 3D hip excursions toe touch 1x10; sit to stands from 18 inch box with slow eccentic lower 1x15   Other Standing Knee Exercises dowel 3# for improved rotation for golf activity- progressed to golf swings with 3# dowel and focus on control  and form             Balance Exercises - 09/15/14 1054    Balance Exercises: Standing   Tandem Stance Eyes closed;3 reps;15 secs   SLS Eyes open;Foam/compliant surface;4 reps;15 secs   Retro Gait Other (comment)  1x37ft with supervision           PT Education - 09/15/14 1107    Education provided No          PT  Short Term Goals - 08/20/14 1059    PT SHORT TERM GOAL #1   Title Patient will demonstrate increased knee extension to -5 degrees to increased stride length.   Status Achieved   PT SHORT TERM GOAL #2   Title Patient will demonstrate increased knee flexion of 115 degrees   Status Achieved   PT SHORT TERM GOAL #3   Title Patient will demonstrate increased Lt hip internal rotation 20 degrees to improve deceleration mechanics.    Status Achieved   PT SHORT TERM GOAL #4   Title Patient will demonstrate increased ankle dorsiflexion of 9 degrees   Status Achieved   PT SHORT TERM GOAL #5   Title Patient will be independent with HEP.    Status New           PT Long Term Goals - 08/20/14 1059    PT LONG TERM GOAL #1   Title Patient will demonstrate increased knee extension to 0 degrees to increased stride length   Status On-going   PT LONG TERM GOAL #2   Title Patient will demonstrate increased knee flexion of 130 degrees to be able to squat to the ground to work in garden   Status On-going   PT [place] #3   Title Patient will demonstrate increased bilateral hip internal rotation 30 degrees to improve deceleration mechanics.    Status On-going   PT LONG TERM GOAL #4   Title Patient will demonstrate increased ankle dorsiflexion of 14 degrees   Status On-going   PT LONG TERM GOAL #5   Title Patient will be able to walk 1 hour without pain.    Status On-going               Plan - 09/15/14 1108    Clinical Impression Statement Continued functional exercise program today with focus on eccentric quad work and mechanics with dowel to assist patient in returning to golf. Cues for proper form and technique. Continues to struggle with balance tasks especially on foam and other unstable surfaces.    Pt will benefit from skilled therapeutic intervention in order to improve on the following deficits Abnormal gait;Decreased strength;Pain;Difficulty walking;Decreased activity  tolerance;Decreased range of motion;Impaired flexibility;Decreased endurance   Rehab Potential Good   PT Frequency 2x / week   PT Duration 8 weeks   PT Treatment/Interventions Therapeutic exercise;Balance training;Patient/family education;Manual techniques;Therapeutic activities   PT Next Visit Plan continue to focus on balance, eccentric control of quadricep as well as improving extension. Dowel and golf swing mechanics.  Possible DC on 11th if progress allows.    PT Home Exercise Plan hamstring, hip flexor, and calf 3 way stretches.    Consulted and Agree with Plan of Care Patient        Problem List Patient Active Problem List   Diagnosis Date Noted  . DJD (degenerative joint disease) of knee 07/14/2014  . Acute upper respiratory infection 06/12/2014  . Cigarette smoker 06/12/2014  . Cough 06/12/2014  . Primary localized osteoarthritis of left knee   . Hyperlipidemia   . GERD (gastroesophageal reflux disease)   . Back pain   . Dyspnea 05/07/2014  . Multiple pulmonary nodules 05/07/2014   Deniece Ree PT, DPT [place] [address] [place], [place], [address] Phone: [phone]   Fax:  [phone]

## 2014-09-17 ENCOUNTER — Ambulatory Visit (HOSPITAL_COMMUNITY): Payer: 59 | Admitting: Physical Therapy

## 2014-09-17 DIAGNOSIS — R262 Difficulty in walking, not elsewhere classified: Secondary | ICD-10-CM

## 2014-09-17 DIAGNOSIS — Z471 Aftercare following joint replacement surgery: Secondary | ICD-10-CM | POA: Diagnosis not present

## 2014-09-17 DIAGNOSIS — M25562 Pain in left knee: Secondary | ICD-10-CM

## 2014-09-17 DIAGNOSIS — R269 Unspecified abnormalities of gait and mobility: Secondary | ICD-10-CM

## 2014-09-17 DIAGNOSIS — M25662 Stiffness of left knee, not elsewhere classified: Secondary | ICD-10-CM

## 2014-09-17 NOTE — Therapy (Signed)
[place], [place], [address] Phone: [phone]   Fax:  [phone]  Physical Therapy Treatment/Reassessment  Patient Details  Name: Patrick Mathews MRN: [id number] Date of Birth: 11-05-1950 Referring Provider:  Elsie Saas, MD  Encounter Date: 09/17/2014      PT End of Session - 09/17/14 1159    Visit Number 13   Number of Visits 16   Authorization Type UHC/Medicare   Authorization - Visit Number 13   Authorization - Number of Visits 16   PT Start Time 2197   PT Stop Time 1102   PT Time Calculation (min) 44 min   Activity Tolerance Patient tolerated treatment well   Behavior During Therapy [facility] for tasks assessed/performed      Past Medical History  Diagnosis Date  . Hyperlipidemia   . GERD (gastroesophageal reflux disease)   . Seasonal allergies   . Asthma   . Exertional dyspnea   . Pneumonia 11/2013  . OSA on CPAP     uses cpap occasionally  . Primary localized osteoarthritis of left knee   . Arthritis     "knees; back" (07/14/2014)    Past Surgical History  Procedure Laterality Date  . Knee arthroscopy Bilateral 1990's  . Bunionectomy Right  2013  . Colonoscopy N/A 11/06/2012    Procedure: COLONOSCOPY;  Surgeon: Jamesetta So, MD;  [facility];  Service: Gastroenterology;  Laterality: N/A;  . 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 Joint replacement    . Tonsillectomy  1950's  . Posterior lumbar fusion  1985; 2009    Dr Joya Salm  . Anterior cervical decomp/discectomy fusion  2009    Dr Joya Salm  . Back surgery    . 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  Visit Diagnosis:  Knee stiffness, left  Difficulty walking  Abnormality of gait  Left knee pain      Subjective Assessment - 09/17/14  1023    Subjective Patient states no pain only stiffness and difficulty going down stairs.    Currently in Pain? No/denies            [facility] PT Assessment - 09/17/14 0001    Assessment   Medical Diagnosis Lt TKA resulting in difficulty walking and stiffness in Lt knee.    Onset Date 07/14/14   Next MD Visit [place]: 08/26/14   Prior Therapy yes HHPT   Observation/Other Assessments   Focus on Therapeutic Outcomes (FOTO)  11% limited   Other:   Other/ Comments Gait WNL   Posture/Postural Control   Posture Comments WNL   AROM   Right Hip External Rotation  45   Right Hip Internal Rotation  30   Left Hip External Rotation  46   Left Hip Internal Rotation  32   Left Knee Extension -1   Left Knee Flexion 120   Right Ankle Dorsiflexion 20   Left Ankle Dorsiflexion 11   Strength   Right Hip Flexion 5/5   Left Hip Flexion 5/5   Right Knee Flexion 5/5   Right Knee Extension 5/5   Left Knee Flexion 5/5   Left Knee Extension 5/5   Left Ankle Dorsiflexion 5/5  [place] Adult PT Treatment/Exercise - 09/17/14 0001    Knee/Hip Exercises: Stretches   Active Hamstring Stretch 3 reps;30 seconds   Active Hamstring Stretch Limitations 14 inch box   Hip Flexor Stretch 3 reps;20 seconds   Hip Flexor Stretch Limitations knee drives on 66ZL box 93T 3 seconds 3 sets with therqapis assist for straightening Lt leg   Piriformis Stretch 3 reps;30 seconds   Piriformis Stretch Limitations seated   Gastroc Stretch 3 reps;30 seconds   Gastroc Stretch Limitations slantboard, 3 way   Knee/Hip Exercises: Standing   Heel Raises 20 reps   Heel Raises Limitations heel toe   Forward Lunges 5 reps   Forward Lunges Limitations lunge matrix common   Lateral Step Up Left;15 reps;Hand Hold: 0;Step Height: 8"   Forward Step Up Left;15 reps;Hand Hold: 0;Step Height: 8"   Step Down Both;1 set;15 reps   Step Down Limitations 6 inch step with focus on slow eccentric lower    Other  Standing Knee Exercises 3D hip excursions single leg toe touch 1x10;    Other Standing Knee Exercises forward knee driver on floor with focus on terminal knee extension 10x   Knee/Hip Exercises: Supine   Quad Sets Left;10 reps;2 sets   Quad Sets Limitations 10 with no assistance and 10 with 15lb assistance to increase knee extension.                   PT Short Term Goals - 09/17/14 1157    PT SHORT TERM GOAL #1   Title Patient will demonstrate increased knee extension to -5 degrees to increased stride length.   Status Achieved   PT SHORT TERM GOAL #2   Title Patient will demonstrate increased knee flexion of 115 degrees   Status Achieved   PT SHORT TERM GOAL #3   Title Patient will demonstrate increased Lt hip internal rotation 20 degrees to improve deceleration mechanics.    Status Achieved   PT SHORT TERM GOAL #4   Title Patient will demonstrate increased ankle dorsiflexion of 9 degrees   Status Achieved   PT SHORT TERM GOAL #5   Title Patient will be independent with HEP.    Status Achieved           PT Long Term Goals - 09/17/14 1158    PT LONG TERM GOAL #1   Title Patient will demonstrate increased knee extension to 0 degrees to increased stride length   Status On-going   PT LONG TERM GOAL #2   Title Patient will demonstrate increased knee flexion of 130 degrees to be able to squat to the ground to work in garden   Status On-going   PT [place] #3   Title Patient will demonstrate increased bilateral hip internal rotation 30 degrees to improve deceleration mechanics.    Status Partially Met   PT LONG TERM GOAL #4   Title Patient will demonstrate increased ankle dorsiflexion of 14 degrees   Status On-going   PT LONG TERM GOAL #5   Title Patient will be able to walk 1 hour without pain.    Status On-going               Plan - 09/17/14 1156    Clinical Impression Statement Patient has made great progress with ROM nearing WNL. Patient states he  feels he is near 100% which is consistent with FOTO at 11%. Patient wants to continue phsyical therapy with focus on education for HEP and discharge. no  painnoted today with all exercises performed with good form.   PT Next Visit Plan continue to focus on balance, eccentric control of quadricep as well as improving extension for improved stair performance. . Dowel and golf swing mechanics. Focus of therapy to be on HEP progression.            G-Codes - 2014-10-17 1200-09-07    Functional Assessment Tool Used FOTO 11%   Functional Limitation Mobility: Walking and moving around   Mobility: Walking and Moving Around Current Status [phone]) At least 1 percent but less than 20 percent impaired, limited or restricted   Mobility: Walking and Moving Around Goal Status [phone]) 0 percent impaired, limited or restricted      Problem List Patient Active Problem List   Diagnosis Date Noted  . DJD (degenerative joint disease) of knee 07/14/2014  . Acute upper respiratory infection 06/12/2014  . Cigarette smoker 06/12/2014  . Cough 06/12/2014  . Primary localized osteoarthritis of left knee   . Hyperlipidemia   . GERD (gastroesophageal reflux disease)   . Back pain   . Dyspnea 05/07/2014  . Multiple pulmonary nodules 05/07/2014   Devona Konig PT DPT [place], [place], [address] Phone: [phone]   Fax:  [phone]

## 2014-09-22 ENCOUNTER — Ambulatory Visit (HOSPITAL_COMMUNITY): Payer: 59 | Admitting: Physical Therapy

## 2014-09-22 DIAGNOSIS — M25662 Stiffness of left knee, not elsewhere classified: Secondary | ICD-10-CM

## 2014-09-22 DIAGNOSIS — M25562 Pain in left knee: Secondary | ICD-10-CM

## 2014-09-22 DIAGNOSIS — R262 Difficulty in walking, not elsewhere classified: Secondary | ICD-10-CM

## 2014-09-22 DIAGNOSIS — Z471 Aftercare following joint replacement surgery: Secondary | ICD-10-CM | POA: Diagnosis not present

## 2014-09-22 DIAGNOSIS — R269 Unspecified abnormalities of gait and mobility: Secondary | ICD-10-CM

## 2014-09-22 NOTE — Therapy (Signed)
[place], [place], [address] Phone: [phone]   Fax:  [phone]  Physical Therapy Treatment  Patient Details  Name: Patrick Mathews MRN: [id number] Date of Birth: 25-Jun-1950 Referring Provider:  Elsie Saas, MD  Encounter Date: 09/22/2014      PT End of Session - 09/22/14 1521    Visit Number 14   Number of Visits 16   Date for PT Re-Evaluation 09/28/15   Authorization Type UHC/Medicare   Authorization - Visit Number 14   Authorization - Number of Visits 16   PT Start Time 3557   PT Stop Time 1427   PT Time Calculation (min) 41 min   Activity Tolerance Patient tolerated treatment well   Behavior During Therapy [facility] for tasks assessed/performed      Past Medical History  Diagnosis Date  . Hyperlipidemia   . GERD (gastroesophageal reflux disease)   . Seasonal allergies   . Asthma   . Exertional dyspnea   . Pneumonia 11/2013  . OSA on CPAP     uses cpap occasionally  . Primary localized osteoarthritis of left knee   . Arthritis     "knees; back" (07/14/2014)    Past Surgical History  Procedure Laterality Date  . Knee arthroscopy Bilateral 1990's  . Bunionectomy Right  2013  . Colonoscopy N/A 11/06/2012    Procedure: COLONOSCOPY;  Surgeon: Jamesetta So, MD;  [facility];  Service: Gastroenterology;  Laterality: N/A;  . 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 Joint replacement    . Tonsillectomy  1950's  . Posterior lumbar fusion  1985; 2009    Dr Joya Salm  . Anterior cervical decomp/discectomy fusion  2009    Dr Joya Salm  . Back surgery    . 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  Visit Diagnosis:  Knee stiffness, left  Difficulty walking  Abnormality of gait  Left knee pain       Subjective Assessment - 09/22/14 1520    Subjective Patient states he is doing well; he built a playhouse with his son for his grandkids this weekend and knee did very well with activity    Pertinent History 07/14/14 Lt TKA, multiple lumbar spine surgeries and a cervical spine surgery. Patient lives an active life style plays golf 3 days a weels. History of Rt knee scope. Patient notes Rt knee also has pain and will likely have Rt TKA in future. [name] performed surgery. Patient had 5 HHPT viists. Patient is driving.    Currently in Pain? No/denies                       [facility] Adult PT Treatment/Exercise - 09/22/14 0001    Knee/Hip Exercises: Stretches   Active Hamstring Stretch 3 reps;30 seconds   Active Hamstring Stretch Limitations 12 inch box, 3 way    Quad Stretch 3 reps;30 seconds   Quad Stretch Limitations prone   Piriformis Stretch 3 reps;30 seconds   Piriformis Stretch Limitations seated   Gastroc Stretch 3 reps;30 seconds   Gastroc Stretch Limitations slantboard, 3 way   Knee/Hip Exercises: Standing   Step Down Both;1 set;15 reps   Step Down Limitations 8 inch step   Other Standing Knee Exercises Eccentric stand  to sits for eccentric quad 1x10; posterior wall bumps 1x10   Other Standing Knee Exercises dowel golf swings with focus on form, 4# dowel 1x15 each side              Balance Exercises - 09/22/14 1412    Balance Exercises: Standing   Tandem Stance Eyes open;3 reps;30 secs   SLS Eyes open;Foam/compliant surface;15 secs;3 reps   Tandem Gait Forward;3 reps;Other (comment)  approx 82f round trip           PT Education - 09/22/14 1520    Education provided Yes   Education Details Education regarding form for balance exercises that were added to HEP today   Person(s) Educated Patient   Methods Explanation;Demonstration;Handout   Comprehension Verbalized understanding;Returned demonstration          PT Short Term Goals - 09/17/14 1157     PT SHORT TERM GOAL #1   Title Patient will demonstrate increased knee extension to -5 degrees to increased stride length.   Status Achieved   PT SHORT TERM GOAL #2   Title Patient will demonstrate increased knee flexion of 115 degrees   Status Achieved   PT SHORT TERM GOAL #3   Title Patient will demonstrate increased Lt hip internal rotation 20 degrees to improve deceleration mechanics.    Status Achieved   PT SHORT TERM GOAL #4   Title Patient will demonstrate increased ankle dorsiflexion of 9 degrees   Status Achieved   PT SHORT TERM GOAL #5   Title Patient will be independent with HEP.    Status Achieved           PT Long Term Goals - 09/17/14 1158    PT LONG TERM GOAL #1   Title Patient will demonstrate increased knee extension to 0 degrees to increased stride length   Status On-going   PT LONG TERM GOAL #2   Title Patient will demonstrate increased knee flexion of 130 degrees to be able to squat to the ground to work in garden   Status On-going   PT [place]#3   Title Patient will demonstrate increased bilateral hip internal rotation 30 degrees to improve deceleration mechanics.    Status Partially Met   PT LONG TERM GOAL #4   Title Patient will demonstrate increased ankle dorsiflexion of 14 degrees   Status On-going   PT LONG TERM GOAL #5   Title Patient will be able to walk 1 hour without pain.    Status On-going               Plan - 09/22/14 1521    Clinical Impression Statement Patient continues to state that he feels his major remaining impairments are difficulties with descending stairs and with balance. Focused on balance activities today as well as eccentric quadriceps activity, and added all exercises onto HEP program in order to assist patient with managing his condition at home. Patient continues to be interested in completing the rest of the therapy sessions he has scheduled  at this time.    Pt will benefit from skilled therapeutic  intervention in order to improve on the following deficits Abnormal gait;Decreased strength;Pain;Difficulty walking;Decreased activity tolerance;Decreased range of motion;Impaired flexibility;Decreased endurance   Rehab Potential Good   PT Frequency 2x / week   PT Duration 8 weeks   PT Treatment/Interventions Therapeutic exercise;Balance training;Patient/family education;Manual techniques;Therapeutic activities   PT Next Visit Plan continue to focus on balance, eccentric control of quadricep as well as improving  extension for improved stair performance. . Dowel and golf swing mechanics. Focus of therapy to be on HEP progression.     PT Home Exercise Plan hamstring, hip flexor, and calf 3 way stretches.    Consulted and Agree with Plan of Care Patient        Problem List Patient Active Problem List   Diagnosis Date Noted  . DJD (degenerative joint disease) of knee 07/14/2014  . Acute upper respiratory infection 06/12/2014  . Cigarette smoker 06/12/2014  . Cough 06/12/2014  . Primary localized osteoarthritis of left knee   . Hyperlipidemia   . GERD (gastroesophageal reflux disease)   . Back pain   . Dyspnea 05/07/2014  . Multiple pulmonary nodules 05/07/2014   Deniece Ree PT, DPT [place] [address] [place], [place], [address] Phone: [phone]   Fax:  [phone]

## 2014-09-22 NOTE — Patient Instructions (Addendum)
Anterior Step-Down   Stand with both feet on standard step. Step down in A direction with left foot, touching heel to the floor and return __15_ times. __1_ sets __2_ times per day.  http://gglj.exer.us/184   Copyright  VHI. All rights reserved.   Tandem Stance   Right foot in front of left, heel touching toe both feet "straight ahead". Stand on Foot Triangle of Support with both feet. Balance in this position for at least 30 seconds, or longer if you are able to. Then do with left foot in front of right. If this is too easy, you can always do activity while standing on a folded towel OR close your eyes.   Copyright  VHI. All rights reserved.  SINGLE LIMB STANCE   Stance: single leg on floor. Raise leg. Hold as long as you can with a goal of at least 30 seconds Repeat with other leg. __3_ reps per set, _2__ sets per day, _7__ days per week. Can stand on folded towel or close your eyes to make task more difficult.   Copyright  VHI. All rights reserved.      Functional Quadriceps: Sit to Stand   Sit on edge of chair, feet flat on floor. Stand upright, extending knees fully. Then slowly lower yourself back down to sitting by counting to 4 as you lower yourself back down to the seat. Repeat __10-15__ times per set. Do __1__ sets per session. Do __2__ sessions per day.  http://orth.exer.us/734   Copyright  VHI. All rights reserved.   WALL BUMPS  Stand approximately 4-6 inches away from wall and lean yourself "fall" backwards until you feel the wall behind you. Then, not using your hands, use your shoulders and back to push off of the wall and assist you back up into standing.   Feet Heel-Toe "Tandem"   Arms outstretched, walk a straight line bringing one foot directly in front of the other. Walk for approximately 15-29ft, then turn around and repeat. Do 3 laps twice a day.   Copyright  VHI. All rights reserved.

## 2014-09-24 ENCOUNTER — Ambulatory Visit (HOSPITAL_COMMUNITY): Payer: 59

## 2014-09-24 DIAGNOSIS — M25662 Stiffness of left knee, not elsewhere classified: Secondary | ICD-10-CM

## 2014-09-24 DIAGNOSIS — Z471 Aftercare following joint replacement surgery: Secondary | ICD-10-CM | POA: Diagnosis not present

## 2014-09-24 DIAGNOSIS — M25562 Pain in left knee: Secondary | ICD-10-CM

## 2014-09-24 DIAGNOSIS — R262 Difficulty in walking, not elsewhere classified: Secondary | ICD-10-CM

## 2014-09-24 DIAGNOSIS — R269 Unspecified abnormalities of gait and mobility: Secondary | ICD-10-CM

## 2014-09-24 NOTE — Therapy (Signed)
[place], [place], [address] Phone: [phone]   Fax:  [phone]  Physical Therapy Treatment  Patient Details  Name: Patrick Mathews MRN: [id number] Date of Birth: 14-Feb-1951 Referring Provider:  Elsie Saas, MD  Encounter Date: 09/24/2014      PT End of Session - 09/24/14 0942    Visit Number 15   Number of Visits 16   Date for PT Re-Evaluation 09/28/15   Authorization Type UHC/Medicare   Authorization - Visit Number 15   Authorization - Number of Visits 16   PT Start Time 0932   PT Stop Time 1025   PT Time Calculation (min) 53 min   Activity Tolerance Patient tolerated treatment well   Behavior During Therapy [facility] for tasks assessed/performed      Past Medical History  Diagnosis Date  . Hyperlipidemia   . GERD (gastroesophageal reflux disease)   . Seasonal allergies   . Asthma   . Exertional dyspnea   . Pneumonia 11/2013  . OSA on CPAP     uses cpap occasionally  . Primary localized osteoarthritis of left knee   . Arthritis     "knees; back" (07/14/2014)    Past Surgical History  Procedure Laterality Date  . Knee arthroscopy Bilateral 1990's  . Bunionectomy Right  2013  . Colonoscopy N/A 11/06/2012    Procedure: COLONOSCOPY;  Surgeon: Jamesetta So, MD;  [facility];  Service: Gastroenterology;  Laterality: N/A;  . 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 Joint replacement    . Tonsillectomy  1950's  . Posterior lumbar fusion  1985; 2009    Dr Joya Salm  . Anterior cervical decomp/discectomy fusion  2009    Dr Joya Salm  . Back surgery    . 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  Visit Diagnosis:  Knee stiffness, left  Difficulty walking  Abnormality of gait  Left knee pain       Subjective Assessment - 09/24/14 0936    Subjective Pt reports MD apt yesterday and MD happy with progress, plans to return to chipping and putting for preparation for return to golf.     Pertinent History 07/14/14 Lt TKA, multiple lumbar spine surgeries and a cervical spine surgery. Patient lives an active life style plays golf 3 days a weels. History of Rt knee scope. Patient notes Rt knee also has pain and will likely have Rt TKA in future. [name] performed surgery. Patient had 5 HHPT viists. Patient is driving.    Currently in Pain? No/denies             [facility] Adult PT Treatment/Exercise - 09/24/14 0001    Knee/Hip Exercises: Stretches   Active Hamstring Stretch 3 reps;30 seconds   Active Hamstring Stretch Limitations 14in step 3 directions   Quad Stretch 3 reps;30 seconds   Quad Stretch Limitations prone with rope   Piriformis Stretch 3 reps;30 seconds   Piriformis Stretch Limitations seated   Gastroc Stretch 3 reps;30 seconds   Gastroc Stretch Limitations slantboard, 3 way   Knee/Hip Exercises: Standing   Forward Lunges Both;10 reps   Forward Lunges Limitations common lunge matrix   Lateral Step Up Left;15 reps;Hand Hold: 0;Step Height: 8"   Lateral Step Up Limitations cueing for eccentric  control   Step Down Both;15 reps;Hand Hold: 0;Step Height: 8";2 sets;Step Height: 2";5 reps   Step Down Limitations cueing for eccentric control   Functional Squat 10 reps;2 sets   Functional Squat Limitations Golfers and golfers reverse with blue ball    SLS Rt 60"+, Lt 38" max of 3   Other Standing Knee Exercises single leg balance reach on 2 in 5 reps each direction   Other Standing Knee Exercises dowel golf swings with focus on form, 5# dowel 1x15 each side              Balance Exercises - 09/24/14 1009    Balance Exercises: Standing   Balance Beam Tandem and retro 3RT              PT Short Term Goals - 09/24/14 0943    PT SHORT TERM GOAL #1   Title Patient will  demonstrate increased knee extension to -5 degrees to increased stride length.   Status Achieved   PT SHORT TERM GOAL #2   Title Patient will demonstrate increased knee flexion of 115 degrees   Status Achieved   PT SHORT TERM GOAL #3   Title Patient will demonstrate increased Lt hip internal rotation 20 degrees to improve deceleration mechanics.    Status Achieved   PT SHORT TERM GOAL #4   Title Patient will demonstrate increased ankle dorsiflexion of 9 degrees   Status Achieved   PT SHORT TERM GOAL #5   Title Patient will be independent with HEP.    Baseline 09/24/2014 Daily   Status Achieved           PT Long Term Goals - 09/24/14 0943    PT LONG TERM GOAL #1   Title Patient will demonstrate increased knee extension to 0 degrees to increased stride length   Status On-going   PT LONG TERM GOAL #2   Title Patient will demonstrate increased knee flexion of 130 degrees to be able to squat to the ground to work in garden   Status On-going   PT [place] #3   Title Patient will demonstrate increased bilateral hip internal rotation 30 degrees to improve deceleration mechanics.    PT LONG TERM GOAL #4   Title Patient will demonstrate increased ankle dorsiflexion of 14 degrees   PT LONG TERM GOAL #5   Title Patient will be able to walk 1 hour without pain.    Baseline 09/24/2014 Able to walk a mile comfortably   Status On-going               Plan - 09/24/14 1012    Clinical Impression Statement Session focus on improving eccentric quad control and improving form with golf swing for return to sport.  Pt continues to exhibit weak eccentric control descending stiars, cueing to improve control.  Pt reported difficutly with balance while descending stairs, added dynamic balance activities and SLS.  Pt encouraged to add SLS to HEP daily.     PT Next Visit Plan Anticipate discharge next session.  Give pt advanced HEP including stair training, common lunge matrix, golfers squat  and golfers swing activities and balance activities.        Problem List Patient Active Problem List   Diagnosis Date Noted  . DJD (degenerative joint disease) of knee 07/14/2014  . Acute upper respiratory infection 06/12/2014  . Cigarette smoker 06/12/2014  . Cough 06/12/2014  . Primary localized osteoarthritis of left knee   . Hyperlipidemia   .  GERD (gastroesophageal reflux disease)   . Back pain   . Dyspnea 05/07/2014  . Multiple pulmonary nodules 05/07/2014   Ihor Austin, [place]; [place] #[id number] [phone]  Aldona Lento 09/24/2014, 10:34 AM  [place], [place], [address] Phone: [phone]   Fax:  [phone]

## 2014-09-29 ENCOUNTER — Ambulatory Visit (HOSPITAL_COMMUNITY): Payer: 59 | Admitting: Physical Therapy

## 2014-09-29 DIAGNOSIS — R269 Unspecified abnormalities of gait and mobility: Secondary | ICD-10-CM

## 2014-09-29 DIAGNOSIS — Z471 Aftercare following joint replacement surgery: Secondary | ICD-10-CM | POA: Diagnosis not present

## 2014-09-29 DIAGNOSIS — R262 Difficulty in walking, not elsewhere classified: Secondary | ICD-10-CM

## 2014-09-29 DIAGNOSIS — M25562 Pain in left knee: Secondary | ICD-10-CM

## 2014-09-29 DIAGNOSIS — M25662 Stiffness of left knee, not elsewhere classified: Secondary | ICD-10-CM

## 2014-09-29 NOTE — Therapy (Signed)
[place], [place], [address] Phone: [phone]   Fax:  [phone]  Physical Therapy Treatment (Discharge)  Patient Details  Name: Patrick Mathews MRN: [id number] Date of Birth: 09/08/1950 Referring Provider:  Elsie Saas, MD  Encounter Date: 09/29/2014      PT End of Session - 09/29/14 0929    Visit Number 16   Number of Visits 16   Date for PT Re-Evaluation 09/28/15   Authorization Type UHC/Medicare   Authorization - Visit Number 28   Authorization - Number of Visits 16   PT Start Time 0845   PT Stop Time 0929   PT Time Calculation (min) 44 min   Activity Tolerance Patient tolerated treatment well   Behavior During Therapy [facility] for tasks assessed/performed      Past Medical History  Diagnosis Date  . Hyperlipidemia   . GERD (gastroesophageal reflux disease)   . Seasonal allergies   . Asthma   . Exertional dyspnea   . Pneumonia 11/2013  . OSA on CPAP     uses cpap occasionally  . Primary localized osteoarthritis of left knee   . Arthritis     "knees; back" (07/14/2014)    Past Surgical History  Procedure Laterality Date  . Knee arthroscopy Bilateral 1990's  . Bunionectomy Right  2013  . Colonoscopy N/A 11/06/2012    Procedure: COLONOSCOPY;  Surgeon: Jamesetta So, MD;  [facility];  Service: Gastroenterology;  Laterality: N/A;  . 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 Joint replacement    . Tonsillectomy  1950's  . Posterior lumbar fusion  1985; 2009    Dr Joya Salm  . Anterior cervical decomp/discectomy fusion  2009    Dr Joya Salm  . Back surgery    . 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  Visit Diagnosis:  Knee stiffness, left  Difficulty walking  Abnormality of gait  Left knee pain       Subjective Assessment - 09/29/14 0847    Subjective Patient reports that he is feeling great, went outside and played some golf this weekend and did a barbeque with no issues    Pertinent History 07/14/14 Lt TKA, multiple lumbar spine surgeries and a cervical spine surgery. Patient lives an active life style plays golf 3 days a weels. History of Rt knee scope. Patient notes Rt knee also has pain and will likely have Rt TKA in future. [name] performed surgery. Patient had 5 HHPT viists. Patient is driving.    Currently in Pain? No/denies            [facility] PT Assessment - 09/29/14 0001    Assessment   Medical Diagnosis Lt TKA resulting in difficulty walking and stiffness in Lt knee.    Onset Date 07/14/14   Next MD Visit [place]: 08/26/14   Prior Therapy yes HHPT   Observation/Other Assessments   Focus on Therapeutic Outcomes (FOTO)  1% limited   Other:   Other/ Comments Gait WNL   Posture/Postural Control   Posture Comments WNL    AROM   Right Hip External Rotation  45   Right Hip Internal Rotation  30   Left Hip External Rotation  45   Left Hip Internal Rotation  30   Left  Knee Extension -3   Left Knee Flexion 122   Right Ankle Dorsiflexion 20   Left Ankle Dorsiflexion 15   Strength   Right Hip Flexion 5/5   Right Hip Extension 5/5   Right Hip ABduction 4+/5   Left Hip Flexion 5/5   Left Hip Extension 4-/5   Left Hip ABduction 4+/5   Right Knee Flexion 5/5   Right Knee Extension 5/5   Left Knee Flexion 5/5   Left Knee Extension 5/5   Left Ankle Dorsiflexion 5/5                   [facility] Adult PT Treatment/Exercise - 09/29/14 0001    Knee/Hip Exercises: Stretches   Active Hamstring Stretch 3 reps;30 seconds   Active Hamstring Stretch Limitations 14in step 3 directions   Quad Stretch 3 reps;30 seconds   Quad Stretch Limitations prone with rope   Piriformis Stretch 3 reps;30 seconds   Piriformis Stretch Limitations seated   Gastroc Stretch 3 reps;30 seconds    Gastroc Stretch Limitations slantboard, 3 way   Knee/Hip Exercises: Standing   Step Down Both;1 set;10 reps   Step Down Limitations 8 inch box, cues for slow eccentric control    Other Standing Knee Exercises Dowel golf swings with 5# dowel, focus on form, 1x15 each side                 PT Education - 09/29/14 0929    Education provided Yes   Education Details Education regarding form for eccentric exercises at home, adding weight machines at gym as long as he is pain free    Person(s) Educated Patient   Methods Explanation   Comprehension Verbalized understanding          PT Short Term Goals - 09/29/14 0931    PT SHORT TERM GOAL #1   Title Patient will demonstrate increased knee extension to -5 degrees to increased stride length.   Time 4   Period Weeks   Status Achieved   PT SHORT TERM GOAL #2   Title Patient will demonstrate increased knee flexion of 115 degrees   Time 4   Period Weeks   Status Achieved   PT SHORT TERM GOAL #3   Title Patient will demonstrate increased Lt hip internal rotation 20 degrees to improve deceleration mechanics.    Time 4   Period Weeks   Status Achieved   PT SHORT TERM GOAL #4   Title Patient will demonstrate increased ankle dorsiflexion of 9 degrees   Time 4   Period Weeks   Status Achieved   PT SHORT TERM GOAL #5   Title Patient will be independent with HEP.    Baseline 09/24/2014 Daily   Time 4   Period Weeks   Status Achieved           PT Long Term Goals - 09/29/14 0932    PT LONG TERM GOAL #1   Title Patient will demonstrate increased knee extension to 0 degrees to increased stride length   Time 8   Period Weeks   Status On-going   PT LONG TERM GOAL #2   Title Patient will demonstrate increased knee flexion of 130 degrees to be able to squat to the ground to work in garden   Baseline 4/18- able to squat but range at best 122    Time 8   Period Weeks   Status On-going   PT LONG TERM GOAL #3   Title Patient  will  demonstrate increased bilateral hip internal rotation 30 degrees to improve deceleration mechanics.    Time 8   Period Weeks   Status Achieved   PT LONG TERM GOAL #4   Title Patient will demonstrate increased ankle dorsiflexion of 14 degrees   Time 8   Period Weeks   Status Achieved   PT LONG TERM GOAL #5   Title Patient will be able to walk 1 hour without pain.    Baseline 10/10/2022- patient states he was able to do this recently    Time 8   Period Weeks   Status Achieved               Plan - 10-10-2014 0930    Clinical Impression Statement Discharge assessment completed today. Patient shows vast improvements in range of motion, strength, and functional abilities today; per patient, his only remaining deficits are balance while descending steps, as he has been able to return to golf and is able to do all tasks and activities he wants and needs to. No questions about HEP and he continues to regularly go to the gym. Patient has overall tolerated skilled PT services well and is to be discharged today.    Pt will benefit from skilled therapeutic intervention in order to improve on the following deficits Abnormal gait;Decreased strength;Pain;Difficulty walking;Decreased activity tolerance;Decreased range of motion;Impaired flexibility;Decreased endurance   Rehab Potential Good   PT Treatment/Interventions Therapeutic exercise;Balance training;Patient/family education;Manual techniques;Therapeutic activities   PT Next Visit Plan Discharge today    Consulted and Agree with Plan of Care Patient          G-Codes - 10-Oct-2014 0933    Functional Assessment Tool Used FOTO 1%   Functional Limitation Mobility: Walking and moving around   Mobility: Walking and Moving Around Goal Status [phone]) 0 percent impaired, limited or restricted   Mobility: Walking and Moving Around Discharge Status [id number]) At least 1 percent but less than 20 percent impaired, limited or restricted      Problem  List Patient Active Problem List   Diagnosis Date Noted  . DJD (degenerative joint disease) of knee 07/14/2014  . Acute upper respiratory infection 06/12/2014  . Cigarette smoker 06/12/2014  . Cough 06/12/2014  . Primary localized osteoarthritis of left knee   . Hyperlipidemia   . GERD (gastroesophageal reflux disease)   . Back pain   . Dyspnea 05/07/2014  . Multiple pulmonary nodules 05/07/2014     PHYSICAL THERAPY DISCHARGE SUMMARY  Visits from Start of Care: 16  Current functional level related to goals / functional outcomes: Patient is regularly going to gym and is independent in managing an advanced HEP on a daily basis. The patient reports that he is able to do everything he needs and wants to do, and returned to golf this weekend. Overall very satisfied with his progress and results with skilled PT services.    Remaining deficits: Difficulty with eccentric control of quadriceps, weakness in left hip extensors    Education / Equipment: Education regarding appropriate performance of advanced HEP  Plan: Patient agrees to discharge.  Patient goals were met. Patient is being discharged due to meeting the stated rehab goals.  ?????        Deniece Ree PT, DPT [place] [address] [place], [place], [address] Phone: [phone]   Fax:  [phone]

## 2015-04-08 ENCOUNTER — Telehealth: Payer: Self-pay | Admitting: *Deleted

## 2015-04-08 NOTE — Telephone Encounter (Signed)
CT CHEST was done in February

## 2015-04-08 NOTE — Telephone Encounter (Signed)
-----   Message from Tanda Rockers, MD sent at 05/07/2014 11:41 AM EST ----- Be sure he has follow up CT chest by now

## 2015-05-08 ENCOUNTER — Ambulatory Visit (INDEPENDENT_AMBULATORY_CARE_PROVIDER_SITE_OTHER): Payer: 59 | Admitting: Internal Medicine

## 2015-05-08 ENCOUNTER — Encounter: Payer: Self-pay | Admitting: Internal Medicine

## 2015-05-08 VITALS — BP 138/88 | HR 68 | Ht 70.0 in | Wt 226.0 lb

## 2015-05-08 DIAGNOSIS — R918 Other nonspecific abnormal finding of lung field: Secondary | ICD-10-CM

## 2015-05-08 DIAGNOSIS — R05 Cough: Secondary | ICD-10-CM | POA: Diagnosis not present

## 2015-05-08 DIAGNOSIS — R059 Cough, unspecified: Secondary | ICD-10-CM

## 2015-05-08 MED ORDER — AMOXICILLIN-POT CLAVULANATE 875-125 MG PO TABS: 1.0000 | ORAL_TABLET | Freq: Two times a day (BID) | ORAL | Status: DC

## 2015-05-08 NOTE — Assessment & Plan Note (Signed)
Complicated by hyperlipidemia/ gerd   Body mass index is 32.43   No results found for: TSH     Contributing to gerd tendency/ doe/reviewed the need and the process to achieve and maintain neg calorie balance > defer f/u primary care including intermittently monitoring thyroid status

## 2015-05-08 NOTE — Assessment & Plan Note (Signed)
-   CT chest [facility] 11/13/13  GG nodule 6 mm LUL and MPNs rul x 4 mm - ReCT 04/23/14 LUL x 9 mm GG changes  Quit smoking 2014  CTa 08/08/2014  Interval resolution of the ground-glass opacity seen in the left upper lobe on the previous study. Other numerous bilateral scattered tiny pulmonary nodules are unchanged in the interval. Repeat CT imaging in 12 months could be performed to ensure stability.> in tickle for recall 08/07/15   Discussed in detail all the  indications, usual  risks and alternatives  relative to the benefits with patient who agrees to proceed with conservative f/u as outlined

## 2015-05-08 NOTE — Progress Notes (Signed)
Subjective:     Patient ID: Patrick Mathews, male   DOB: 1951-02-13   MRN: [id number]  Brief patient profile:  64 yowm quit smoking Jan 2015  Able to do HS sports s inhalers then started using inhalers prn in the 1980s no clear pattern and no need for inhalers x 2005  then pna dx around spring 2015   assoc with  fever, aches, cough, eval by Dr Hilma Favors first with cxr ? RLL infiltrate then  CT showing mpns  and pt referred to pulmonary clinic 05/07/2014 by Dr Hilma Favors for f/u nodules and need to be cleared for L TKR.   History of Present Illness  05/07/2014 1st [facility] office visit/ Evaluna Utke   Chief Complaint  Patient presents with  . Pulmonary Consult    Referred by Dr. Sharilyn Sites for eval of pulmonary nodules. Pt has noticed occ DOE for the past 6 months- gets SOB when playing golf.    new doe since pna x 6 months has not tried any saba. Only sob p 18 holes of golf and really more limited by knee than sob. rec Go ahead with knee surgery and f/u in Feb with pfts/CT    06/12/2014 acute  ov/Jacksen Isip re:   cough  Chief Complaint  Patient presents with  . Acute Visit    pt c/o cough, nasal congestion, runny nose, stuffy nose, hoarseness.  Acutely ill 12/28 green mucus worse early in am and also blowing it out of nose and hoarse hacking cough but not sob rec depomedrol 120 mg IM today Augmentin 875 mg take one pill twice daily  X 10 days Add pepcid 20 mg at bedtime until surgery > Feb 1st 2016 >>  L TKR went fine    08/08/2014 f/u ov/Lucilla Petrenko re: cough gone, no inhalers at all / f/u mpns Chief Complaint  Patient presents with  . Follow-up    PFT done today. Cough has resolved. Pt states breathing is doing well. No new co's today.    breathing is not limiting ex/  sleeping ok resp wise / main time he's sob is bending over at the waist rec We will place you in tickle file for follow up CT in one year. You do not have copd, so the key to good breathing  is to stay in shape and keep your  weight down    05/08/2015  f/u ov/Milta Croson re: cough/ recurrent  Chief Complaint  Patient presents with  . Follow-up    Pt states he is here for yearly f/u. Pt c/o prod cough with green mucus x 2 weeks. Pt denies SOB, PND, f/c/s, and CP/tightness.   worse at hs, takes ppi ac daily.   No obvious day to day or daytime variabilty or assoc cp or chest tightness, subjective wheeze overt   hb symptoms. No unusual exp hx or h/o childhood pna/ asthma or knowledge of premature birth.  Sleeping ok without nocturnal  or early am exacerbation  of respiratory  c/o's or need for noct saba. Also denies any obvious fluctuation of symptoms with weather or environmental changes or other aggravating or alleviating factors except as outlined above   Current Medications, Allergies, Complete Past Medical History, Past Surgical History, Family History, and Social History were reviewed in [employer] record.  ROS  The following are not active complaints unless bolded sore throat, dysphagia, dental problems, itching, sneezing,  nasal congestion or excess/ purulent secretions, ear ache,   fever, chills, sweats, unintended wt  loss, pleuritic or exertional cp, hemoptysis,  orthopnea pnd or leg swelling, presyncope, palpitations, heartburn, abdominal pain, anorexia, nausea, vomiting, diarrhea  or change in bowel or urinary habits, change in stools or urine, dysuria,hematuria,  rash, arthralgias, visual complaints, headache, numbness weakness or ataxia or problems with walking or coordination,  change in mood/affect or memory.                  Objective:   Physical Exam  Amb pleasant wm nad vigorous dry sounding throat clearing, coughing fits   05/08/2015      226   06/12/14 223 lb (101.152 kg)  06/05/14 218 lb 1 oz (98.913 kg)  06/04/14 220 lb (99.791 kg)    Vital signs reviewed      HEENT: nl dentition, turbinates, and orophanx. Nl external ear canals without cough reflex   NECK :   without JVD/Nodes/TM/ nl carotid upstrokes bilaterally   LUNGS: no acc muscle use, clear to A and P bilaterally without cough on insp or exp maneuvers   CV:  RRR  no s3 or murmur or increase in P2, no edema   ABD:  soft and nontender with nl excursion in the supine position. No bruits or organomegaly, bowel sounds nl  MS:  warm without deformities, calf tenderness, cyanosis or clubbing  SKIN: warm and dry without lesions    NEURO:  alert, approp, no deficits       images reviewed include:   CTa 08/08/2014  Interval resolution of the ground-glass opacity seen in the left upper lobe on the previous study. Other numerous bilateral scattered tiny pulmonary nodules are unchanged in the interval. Repeat CT imaging in 12 months could be performed to ensure stability.      Assessment:

## 2015-05-08 NOTE — Assessment & Plan Note (Signed)
Explained the natural history of uri and why it's necessary in patients at risk to treat GERD aggressively - at least  short term -   to reduce risk of evolving cyclical cough initially  triggered by epithelial injury and a heightened sensitivty to the effects of any upper airway irritants,  most importantly acid - related - then perpetuated by epithelial injury related to the cough itself as the upper airway collapses on itself.  That is, the more sensitive the epithelium becomes once it is damaged by the virus, the more the ensuing irritability> the more the cough, the more the secondary reflux (especially in those prone to reflux) the more the irritation of the sensitive mucosa and so on in a  Classic cyclical pattern.    rx for sinus dz with augmentin , add sinus ct with chest ct  Max rx for cyclical cough/ gerd while coughing  I had an extended discussion with the patient reviewing all relevant studies completed to date and  lasting 15 to 20 minutes of a 25 minute visit    Each maintenance medication was reviewed in detail including most importantly the difference between maintenance and prns and under what circumstances the prns are to be triggered using an action plan format that is not reflected in the computer generated alphabetically organized AVS.    Please see instructions for details which were reviewed in writing and the patient given a copy highlighting the part that I personally wrote and discussed at today's ov.

## 2015-05-08 NOTE — Patient Instructions (Addendum)
Augmentin 875 mg take one pill twice daily  X 10 days - take at breakfast and supper with large glass of water.  It would help reduce the usual side effects (diarrhea and yeast infections) if you ate cultured yogurt at lunch.   Pepcid 20 mg one at bedtime until no cough at all for a week  For cough > delsym 2 tsp every 12 hours  For nasal congestion/ sinus ha > advil cold and sinus  We will call you back for ct chest in February > if sinuses still a problem, let us know then and we will scan them   GERD (REFLUX)  is an extremely common cause of respiratory symptoms just like yours , many times with no obvious heartburn at all.    It can be treated with medication, but also with lifestyle changes including elevation of the head of your bed (ideally with 6 inch  bed blocks),  Smoking cessation, avoidance of late meals, excessive alcohol, and avoid fatty foods, chocolate, peppermint, colas, red wine, and acidic juices such as orange juice.  NO MINT OR MENTHOL PRODUCTS SO NO COUGH DROPS  USE SUGARLESS CANDY INSTEAD (Jolley ranchers or Stover's or Life Savers) or even ice chips will also do - the key is to swallow to prevent all throat clearing. NO OIL BASED VITAMINS - use powdered substitutes.

## 2015-06-23 DIAGNOSIS — M542 Cervicalgia: Secondary | ICD-10-CM | POA: Insufficient documentation

## 2015-07-03 ENCOUNTER — Other Ambulatory Visit: Payer: Self-pay | Admitting: Internal Medicine

## 2015-07-03 DIAGNOSIS — R918 Other nonspecific abnormal finding of lung field: Secondary | ICD-10-CM

## 2015-07-07 ENCOUNTER — Other Ambulatory Visit: Payer: Self-pay | Admitting: Neurosurgery

## 2015-07-07 DIAGNOSIS — M5416 Radiculopathy, lumbar region: Secondary | ICD-10-CM

## 2015-07-14 ENCOUNTER — Ambulatory Visit
Admission: RE | Admit: 2015-07-14 | Discharge: 2015-07-14 | Disposition: A | Discharge status: Home / Self Care | Payer: 59 | Source: Ambulatory Visit | Attending: Neurosurgery | Admitting: Neurosurgery

## 2015-07-14 DIAGNOSIS — M5416 Radiculopathy, lumbar region: Secondary | ICD-10-CM

## 2015-07-14 MED ORDER — DIAZEPAM 5 MG PO TABS
10.0000 mg | ORAL_TABLET | Freq: Once | ORAL | Status: AC
  Administered 2015-07-14: 10 mg via ORAL

## 2015-07-14 MED ORDER — IOHEXOL 180 MG/ML  SOLN
15.0000 mL | Freq: Once | INTRAMUSCULAR | Status: AC | PRN
  Administered 2015-07-14: 15 mL via INTRATHECAL

## 2015-07-14 NOTE — Discharge Instructions (Signed)

## 2015-07-20 DIAGNOSIS — Z96652 Presence of left artificial knee joint: Secondary | ICD-10-CM | POA: Diagnosis not present

## 2015-07-23 DIAGNOSIS — G629 Polyneuropathy, unspecified: Secondary | ICD-10-CM | POA: Diagnosis not present

## 2015-08-10 ENCOUNTER — Other Ambulatory Visit: Payer: Self-pay | Admitting: Internal Medicine

## 2015-08-10 ENCOUNTER — Ambulatory Visit (HOSPITAL_COMMUNITY)
Admission: RE | Admit: 2015-08-10 | Discharge: 2015-08-10 | Disposition: A | Discharge status: Home / Self Care | Payer: Medicare Other | Source: Ambulatory Visit | Attending: Internal Medicine | Admitting: Internal Medicine

## 2015-08-10 DIAGNOSIS — R918 Other nonspecific abnormal finding of lung field: Secondary | ICD-10-CM | POA: Diagnosis not present

## 2015-08-10 DIAGNOSIS — R911 Solitary pulmonary nodule: Secondary | ICD-10-CM | POA: Diagnosis not present

## 2015-08-10 NOTE — Progress Notes (Signed)
Quick Note:  Spoke with pt and notified of results per Dr. Wert. Pt verbalized understanding and denied any questions.  ______ 

## 2015-08-20 DIAGNOSIS — M5416 Radiculopathy, lumbar region: Secondary | ICD-10-CM | POA: Diagnosis not present

## 2015-08-20 DIAGNOSIS — Z6831 Body mass index (BMI) 31.0-31.9, adult: Secondary | ICD-10-CM | POA: Diagnosis not present

## 2015-09-08 DIAGNOSIS — X32XXXD Exposure to sunlight, subsequent encounter: Secondary | ICD-10-CM | POA: Diagnosis not present

## 2015-09-08 DIAGNOSIS — Z1283 Encounter for screening for malignant neoplasm of skin: Secondary | ICD-10-CM | POA: Diagnosis not present

## 2015-09-08 DIAGNOSIS — L570 Actinic keratosis: Secondary | ICD-10-CM | POA: Diagnosis not present

## 2015-09-08 DIAGNOSIS — L818 Other specified disorders of pigmentation: Secondary | ICD-10-CM | POA: Diagnosis not present

## 2015-09-14 ENCOUNTER — Encounter (HOSPITAL_COMMUNITY): Payer: Self-pay

## 2015-11-02 ENCOUNTER — Ambulatory Visit (INDEPENDENT_AMBULATORY_CARE_PROVIDER_SITE_OTHER): Payer: Medicare Other | Admitting: Internal Medicine

## 2015-11-02 ENCOUNTER — Encounter: Payer: Self-pay | Admitting: Internal Medicine

## 2015-11-02 ENCOUNTER — Ambulatory Visit (HOSPITAL_COMMUNITY)
Admission: RE | Admit: 2015-11-02 | Discharge: 2015-11-02 | Disposition: A | Discharge status: Home / Self Care | Payer: Medicare Other | Source: Ambulatory Visit | Attending: Internal Medicine | Admitting: Internal Medicine

## 2015-11-02 VITALS — BP 122/86 | HR 71 | Ht 71.0 in | Wt 229.0 lb

## 2015-11-02 DIAGNOSIS — R059 Cough, unspecified: Secondary | ICD-10-CM

## 2015-11-02 DIAGNOSIS — R918 Other nonspecific abnormal finding of lung field: Secondary | ICD-10-CM | POA: Insufficient documentation

## 2015-11-02 DIAGNOSIS — R05 Cough: Secondary | ICD-10-CM | POA: Diagnosis not present

## 2015-11-02 MED ORDER — AMOXICILLIN-POT CLAVULANATE 875-125 MG PO TABS: 1.0000 | ORAL_TABLET | Freq: Two times a day (BID) | ORAL | Status: DC

## 2015-11-02 NOTE — Assessment & Plan Note (Signed)
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   These are the same pts (now being labeled as having "irritable larynx syndrome" by some cough centers) who not infrequently have a history of having failed to tolerate ace inhibitors,  dry powder inhalers or biphosphonates or report having atypical reflux symptoms that don't respond to standard doses of PPI , and are easily confused as having aecopd or asthma flares by even experienced allergists/ pulmonologists.  For now rx for gerd and rx for sinus dz with augmentin x 10 days then sinus ct if not better  I had an extended final summary discussion with the patient reviewing all relevant studies completed to date and  lasting 15 to 20 minutes of a 25 minute visit on the following issues:    Pulmonary f/u is prn with sinus CT  Each maintenance medication was reviewed in detail including most importantly the difference between maintenance and as needed and under what circumstances the prns are to be used.  Please see instructions for details which were reviewed in writing and the patient given a copy.

## 2015-11-02 NOTE — Progress Notes (Signed)
Subjective:     Patient ID: Patrick Mathews, male   DOB: 1951-03-16   MRN: [id number]  Brief patient profile:  65 yowm quit smoking Jan 2015  Able to do HS sports s inhalers then started using inhalers prn in the 1980s no clear pattern and no need for inhalers x 2005  then pna dx around spring 2015   assoc with  fever, aches, cough, eval by Dr Hilma Favors first with cxr ? RLL infiltrate then  CT showing mpns  and pt referred to pulmonary clinic 05/07/2014 by Dr Hilma Favors for f/u nodules and need to be cleared for L TKR.   History of Present Illness  05/07/2014 1st [facility] office visit/ Patrick Mathews   Chief Complaint  Patient presents with  . Pulmonary Consult    Referred by Dr. Sharilyn Sites for eval of pulmonary nodules. Pt has noticed occ DOE for the past 6 months- gets SOB when playing golf.    new doe since pna x 6 months has not tried any saba. Only sob p 18 holes of golf and really more limited by knee than sob. rec Go ahead with knee surgery and f/u in Feb with pfts/CT    06/12/2014 acute  ov/Patrick Mathews re:   cough  Chief Complaint  Patient presents with  . Acute Visit    pt c/o cough, nasal congestion, runny nose, stuffy nose, hoarseness.  Acutely ill 12/28 green mucus worse early in am and also blowing it out of nose and hoarse hacking cough but not sob rec depomedrol 120 mg IM today Augmentin 875 mg take one pill twice daily  X 10 days Add pepcid 20 mg at bedtime until surgery > Feb 1st 2016 >>  L TKR went fine    08/08/2014 f/u ov/Patrick Mathews re: cough gone, no inhalers at all / f/u mpns Chief Complaint  Patient presents with  . Follow-up    PFT done today. Cough has resolved. Pt states breathing is doing well. No new co's today.    breathing is not limiting ex/  sleeping ok resp wise / main time he's sob is bending over at the waist rec We will place you in tickle file for follow up CT in one year. You do not have copd, so the key to good breathing  is to stay in shape and keep your  weight down    05/08/2015  f/u ov/Patrick Mathews re: cough/ recurrent  Chief Complaint  Patient presents with  . Follow-up    Pt states he is here for yearly f/u. Pt c/o prod cough with green mucus x 2 weeks. Pt denies SOB, PND, f/c/s, and CP/tightness.   worse at hs, takes ppi ac daily.  rec Augmentin 875 mg take one pill twice daily  X 10 days - take at breakfast and supper with large glass of water.  It would help reduce the usual side effects (diarrhea and yeast infections) if you ate cultured yogurt at lunch.  Pepcid 20 mg one at bedtime until no cough at all for a week For cough > delsym 2 tsp every 12 hours For nasal congestion/ sinus ha > advil cold and sinus We will call you back for ct chest in February > if sinuses still a problem, let us know then and we will scan them  GERD diet    11/02/2015  f/u ov/Patrick Mathews re: recurrent cough x one week / f/u mpns  - no resp rx  Chief Complaint  Patient presents with  . Follow-up  Pt c/o increased cough for the past few days- prod with yellow to green sputum. His breathing has overall been doing well.   cough worse first thing in am  Assoc with nasal and ear congestion bilaterally/  had improved 100% p prev rx with augmentin  Not limited by breathing from desired activities    No obvious day to day or daytime variabilty or assoc cp or chest tightness, subjective wheeze overt   hb symptoms. No unusual exp hx or h/o childhood pna/ asthma or knowledge of premature birth.  Sleeping ok without nocturnal  or early am exacerbation  of respiratory  c/o's or need for noct saba. Also denies any obvious fluctuation of symptoms with weather or environmental changes or other aggravating or alleviating factors except as outlined above   Current Medications, Allergies, Complete Past Medical History, Past Surgical History, Family History, and Social History were reviewed in [employer] record.  ROS  The following are not active complaints  unless bolded sore throat, dysphagia, dental problems, itching, sneezing,  nasal congestion or excess/ purulent secretions, ear ache,   fever, chills, sweats, unintended wt loss, pleuritic or exertional cp, hemoptysis,  orthopnea pnd or leg swelling, presyncope, palpitations, heartburn, abdominal pain, anorexia, nausea, vomiting, diarrhea  or change in bowel or urinary habits, change in stools or urine, dysuria,hematuria,  rash, arthralgias, visual complaints, headache, numbness weakness or ataxia or problems with walking or coordination,  change in mood/affect or memory.                  Objective:   Physical Exam  Amb pleasant wm nad   11/02/2015        229  05/08/2015      226   06/12/14 223 lb (101.152 kg)  06/05/14 218 lb 1 oz (98.913 kg)  06/04/14 220 lb (99.791 kg)    Vital signs reviewed      HEENT: nl dentition, turbinates, and orophanx.  external ear canals obst with wax bilaterally   NECK :  without JVD/Nodes/TM/ nl carotid upstrokes bilaterally   LUNGS: no acc muscle use, clear to A and P bilaterally without cough on insp or exp maneuvers   CV:  RRR  no s3 or murmur or increase in P2, no edema   ABD:  soft and nontender with nl excursion in the supine position. No bruits or organomegaly, bowel sounds nl  MS:  warm without deformities, calf tenderness, cyanosis or clubbing  SKIN: warm and dry without lesions    NEURO:  alert, approp, no deficits      I personally reviewed images and agree with radiology impression as follows:  CT Chest   11/02/15 1. No acute findings. 2. Resolution of previous sub solid nodule within the right middle lobe the compatible with a benign process. 3. Stable small solid nodules identified in both lungs. Findings compatible with a benign process.       Assessment:     Outpatient Encounter Prescriptions as of 11/02/2015  Medication Sig  . acetaminophen (TYLENOL) 650 MG CR tablet Take 650 mg by mouth every 8 (eight) hours  as needed for pain.  . Clobetasol Propionate 0.05 % lotion as needed.  . gabapentin (NEURONTIN) 600 MG tablet Take 600 mg by mouth at bedtime.  [name] oxyCODONE (OXY IR/ROXICODONE) 5 MG immediate release tablet 1-2 tablets every 4-6 hrs as needed for pain  . RABEprazole (ACIPHEX) 20 MG tablet Take 20 mg by mouth daily.  . rosuvastatin (CRESTOR) 10  MG tablet Take 10 mg by mouth daily.  [name] VIAGRA 100 MG tablet as needed.  [name] amoxicillin-clavulanate (AUGMENTIN) 875-125 MG tablet Take 1 tablet by mouth 2 (two) times daily.  . [DISCONTINUED] amoxicillin-clavulanate (AUGMENTIN) 875-125 MG tablet Take 1 tablet by mouth 2 (two) times daily.   No facility-administered encounter medications on file as of 11/02/2015.

## 2015-11-02 NOTE — Assessment & Plan Note (Signed)
-   CT chest [facility] 11/13/13  GG nodule 6 mm LUL and MPNs rul x 4 mm - ReCT 04/23/14 LUL x 9 mm GG changes  Quit smoking 2014  CTa 08/08/2014  Interval resolution of the ground-glass opacity seen in the left upper lobe on the previous study. Other numerous bilateral scattered tiny pulmonary nodules are unchanged in the interval. Repeat CT imaging in 12 months could be performed to ensure stability - CT 08/10/2015 New 2.1 cm ground-glass nodule in the posterior right middle lobe. While likely infectious/inflammatory, follow-up is recommended. Initial follow-up by chest CT without contrast is recommended in 3 months to confirm persistence.> - CT chest 11/02/2015 CT Chest   11/02/15 1. No acute findings. 2. Resolution of previous sub solid nodule within the right middle lobe the compatible with a benign process. 3. Stable small solid nodules identified in both lungs. Findings compatible with a benign process >>> no further f/u needed

## 2015-11-02 NOTE — Patient Instructions (Signed)
Augmentin 875 mg take one pill twice daily  X 10 days - take at breakfast and supper with large glass of water.  It would help reduce the usual side effects (diarrhea and yeast infections) if you ate cultured yogurt at lunch.   Pepcid 20 mg one at bedtime until no cough at all for a week  For cough > delsym 2 tsp every 12 hours  For nasal congestion/ sinus ha > advil cold and sinus or mucinex D  If not better call me to schedule sinus ct next     If you are satisfied with your treatment plan,  let your doctor know and he/she can either refill your medications or you can return here when your prescription runs out.     If in any way you are not 100% satisfied,  please tell us.  If 100% better, tell your friends!  Pulmonary follow up is as needed

## 2015-11-16 ENCOUNTER — Ambulatory Visit (INDEPENDENT_AMBULATORY_CARE_PROVIDER_SITE_OTHER): Payer: Medicare Other | Admitting: Otolaryngology

## 2015-11-16 DIAGNOSIS — H903 Sensorineural hearing loss, bilateral: Secondary | ICD-10-CM

## 2015-11-16 DIAGNOSIS — H6122 Impacted cerumen, left ear: Secondary | ICD-10-CM | POA: Diagnosis not present

## 2015-11-19 ENCOUNTER — Ambulatory Visit (INDEPENDENT_AMBULATORY_CARE_PROVIDER_SITE_OTHER): Payer: Medicare Other | Admitting: Otolaryngology

## 2015-11-19 DIAGNOSIS — Z1389 Encounter for screening for other disorder: Secondary | ICD-10-CM | POA: Diagnosis not present

## 2015-11-19 DIAGNOSIS — N529 Male erectile dysfunction, unspecified: Secondary | ICD-10-CM | POA: Diagnosis not present

## 2015-11-19 DIAGNOSIS — Z6833 Body mass index (BMI) 33.0-33.9, adult: Secondary | ICD-10-CM | POA: Diagnosis not present

## 2015-11-19 DIAGNOSIS — R7989 Other specified abnormal findings of blood chemistry: Secondary | ICD-10-CM | POA: Diagnosis not present

## 2015-11-19 DIAGNOSIS — Z23 Encounter for immunization: Secondary | ICD-10-CM | POA: Diagnosis not present

## 2015-11-19 DIAGNOSIS — E782 Mixed hyperlipidemia: Secondary | ICD-10-CM | POA: Diagnosis not present

## 2016-01-21 DIAGNOSIS — E6609 Other obesity due to excess calories: Secondary | ICD-10-CM | POA: Diagnosis not present

## 2016-01-21 DIAGNOSIS — E782 Mixed hyperlipidemia: Secondary | ICD-10-CM | POA: Diagnosis not present

## 2016-01-21 DIAGNOSIS — K219 Gastro-esophageal reflux disease without esophagitis: Secondary | ICD-10-CM | POA: Diagnosis not present

## 2016-01-21 DIAGNOSIS — Z0001 Encounter for general adult medical examination with abnormal findings: Secondary | ICD-10-CM | POA: Diagnosis not present

## 2016-01-21 DIAGNOSIS — Z6833 Body mass index (BMI) 33.0-33.9, adult: Secondary | ICD-10-CM | POA: Diagnosis not present

## 2016-02-24 DIAGNOSIS — Z87891 Personal history of nicotine dependence: Secondary | ICD-10-CM | POA: Diagnosis not present

## 2016-02-24 DIAGNOSIS — Z136 Encounter for screening for cardiovascular disorders: Secondary | ICD-10-CM | POA: Diagnosis not present

## 2016-03-22 DIAGNOSIS — Z1283 Encounter for screening for malignant neoplasm of skin: Secondary | ICD-10-CM | POA: Diagnosis not present

## 2016-03-22 DIAGNOSIS — L308 Other specified dermatitis: Secondary | ICD-10-CM | POA: Diagnosis not present

## 2016-03-22 DIAGNOSIS — L820 Inflamed seborrheic keratosis: Secondary | ICD-10-CM | POA: Diagnosis not present

## 2016-04-25 DIAGNOSIS — M542 Cervicalgia: Secondary | ICD-10-CM | POA: Diagnosis not present

## 2016-04-25 DIAGNOSIS — S46011A Strain of muscle(s) and tendon(s) of the rotator cuff of right shoulder, initial encounter: Secondary | ICD-10-CM | POA: Diagnosis not present

## 2016-05-23 ENCOUNTER — Ambulatory Visit (INDEPENDENT_AMBULATORY_CARE_PROVIDER_SITE_OTHER): Payer: Medicare Other | Admitting: Otolaryngology

## 2016-05-23 DIAGNOSIS — H6122 Impacted cerumen, left ear: Secondary | ICD-10-CM | POA: Diagnosis not present

## 2016-07-05 DIAGNOSIS — H40053 Ocular hypertension, bilateral: Secondary | ICD-10-CM | POA: Diagnosis not present

## 2016-08-09 DIAGNOSIS — L218 Other seborrheic dermatitis: Secondary | ICD-10-CM | POA: Diagnosis not present

## 2016-08-09 DIAGNOSIS — L821 Other seborrheic keratosis: Secondary | ICD-10-CM | POA: Diagnosis not present

## 2016-08-09 DIAGNOSIS — X32XXXD Exposure to sunlight, subsequent encounter: Secondary | ICD-10-CM | POA: Diagnosis not present

## 2016-08-09 DIAGNOSIS — L570 Actinic keratosis: Secondary | ICD-10-CM | POA: Diagnosis not present

## 2016-08-31 DIAGNOSIS — L570 Actinic keratosis: Secondary | ICD-10-CM | POA: Diagnosis not present

## 2016-08-31 DIAGNOSIS — X32XXXD Exposure to sunlight, subsequent encounter: Secondary | ICD-10-CM | POA: Diagnosis not present

## 2016-08-31 DIAGNOSIS — L568 Other specified acute skin changes due to ultraviolet radiation: Secondary | ICD-10-CM | POA: Diagnosis not present

## 2016-09-26 DIAGNOSIS — Z6834 Body mass index (BMI) 34.0-34.9, adult: Secondary | ICD-10-CM | POA: Diagnosis not present

## 2016-09-26 DIAGNOSIS — L03221 Cellulitis of neck: Secondary | ICD-10-CM | POA: Diagnosis not present

## 2016-09-26 DIAGNOSIS — Z1389 Encounter for screening for other disorder: Secondary | ICD-10-CM | POA: Diagnosis not present

## 2016-09-26 DIAGNOSIS — Z23 Encounter for immunization: Secondary | ICD-10-CM | POA: Diagnosis not present

## 2016-09-26 DIAGNOSIS — E6609 Other obesity due to excess calories: Secondary | ICD-10-CM | POA: Diagnosis not present

## 2016-11-21 ENCOUNTER — Ambulatory Visit (INDEPENDENT_AMBULATORY_CARE_PROVIDER_SITE_OTHER): Payer: Medicare Other | Admitting: Otolaryngology

## 2016-11-21 DIAGNOSIS — H6121 Impacted cerumen, right ear: Secondary | ICD-10-CM

## 2016-11-29 DIAGNOSIS — Z1389 Encounter for screening for other disorder: Secondary | ICD-10-CM | POA: Diagnosis not present

## 2016-11-29 DIAGNOSIS — W57XXXA Bitten or stung by nonvenomous insect and other nonvenomous arthropods, initial encounter: Secondary | ICD-10-CM | POA: Diagnosis not present

## 2016-11-29 DIAGNOSIS — Z6834 Body mass index (BMI) 34.0-34.9, adult: Secondary | ICD-10-CM | POA: Diagnosis not present

## 2016-11-29 DIAGNOSIS — E6609 Other obesity due to excess calories: Secondary | ICD-10-CM | POA: Diagnosis not present

## 2016-11-29 DIAGNOSIS — R221 Localized swelling, mass and lump, neck: Secondary | ICD-10-CM | POA: Diagnosis not present

## 2016-12-07 DIAGNOSIS — R221 Localized swelling, mass and lump, neck: Secondary | ICD-10-CM | POA: Diagnosis not present

## 2017-01-05 DIAGNOSIS — L0211 Cutaneous abscess of neck: Secondary | ICD-10-CM | POA: Diagnosis not present

## 2017-04-25 DIAGNOSIS — Z1389 Encounter for screening for other disorder: Secondary | ICD-10-CM | POA: Diagnosis not present

## 2017-04-25 DIAGNOSIS — Z6834 Body mass index (BMI) 34.0-34.9, adult: Secondary | ICD-10-CM | POA: Diagnosis not present

## 2017-04-25 DIAGNOSIS — R846 Abnormal cytological findings in specimens from respiratory organs and thorax: Secondary | ICD-10-CM | POA: Diagnosis not present

## 2017-04-25 DIAGNOSIS — Z23 Encounter for immunization: Secondary | ICD-10-CM | POA: Diagnosis not present

## 2017-04-25 DIAGNOSIS — R7309 Other abnormal glucose: Secondary | ICD-10-CM | POA: Diagnosis not present

## 2017-04-25 DIAGNOSIS — Z125 Encounter for screening for malignant neoplasm of prostate: Secondary | ICD-10-CM | POA: Diagnosis not present

## 2017-04-25 DIAGNOSIS — Z0001 Encounter for general adult medical examination with abnormal findings: Secondary | ICD-10-CM | POA: Diagnosis not present

## 2017-04-25 DIAGNOSIS — M1991 Primary osteoarthritis, unspecified site: Secondary | ICD-10-CM | POA: Diagnosis not present

## 2017-04-25 DIAGNOSIS — K219 Gastro-esophageal reflux disease without esophagitis: Secondary | ICD-10-CM | POA: Diagnosis not present

## 2017-04-25 DIAGNOSIS — E785 Hyperlipidemia, unspecified: Secondary | ICD-10-CM | POA: Diagnosis not present

## 2017-04-26 ENCOUNTER — Other Ambulatory Visit (HOSPITAL_COMMUNITY): Payer: Self-pay | Admitting: Family Medicine

## 2017-04-26 DIAGNOSIS — F17210 Nicotine dependence, cigarettes, uncomplicated: Secondary | ICD-10-CM

## 2017-04-27 ENCOUNTER — Other Ambulatory Visit (HOSPITAL_COMMUNITY): Payer: Self-pay | Admitting: Family Medicine

## 2017-04-27 DIAGNOSIS — Z1389 Encounter for screening for other disorder: Secondary | ICD-10-CM

## 2017-04-27 DIAGNOSIS — M1711 Unilateral primary osteoarthritis, right knee: Secondary | ICD-10-CM | POA: Diagnosis not present

## 2017-04-27 DIAGNOSIS — Z96652 Presence of left artificial knee joint: Secondary | ICD-10-CM | POA: Diagnosis not present

## 2017-04-27 DIAGNOSIS — Z363 Encounter for antenatal screening for malformations: Secondary | ICD-10-CM

## 2017-04-27 DIAGNOSIS — Z0001 Encounter for general adult medical examination with abnormal findings: Secondary | ICD-10-CM

## 2017-04-27 DIAGNOSIS — F17210 Nicotine dependence, cigarettes, uncomplicated: Secondary | ICD-10-CM

## 2017-04-27 DIAGNOSIS — Z23 Encounter for immunization: Secondary | ICD-10-CM

## 2017-05-03 ENCOUNTER — Encounter (HOSPITAL_COMMUNITY): Payer: Self-pay

## 2017-05-03 ENCOUNTER — Ambulatory Visit (HOSPITAL_COMMUNITY)
Admission: RE | Admit: 2017-05-03 | Discharge: 2017-05-03 | Disposition: A | Discharge status: Home / Self Care | Payer: Medicare Other | Source: Ambulatory Visit | Attending: Family Medicine | Admitting: Family Medicine

## 2017-05-03 DIAGNOSIS — Z23 Encounter for immunization: Secondary | ICD-10-CM

## 2017-05-03 DIAGNOSIS — Z1389 Encounter for screening for other disorder: Secondary | ICD-10-CM

## 2017-05-03 DIAGNOSIS — Z0001 Encounter for general adult medical examination with abnormal findings: Secondary | ICD-10-CM

## 2017-05-03 DIAGNOSIS — F17210 Nicotine dependence, cigarettes, uncomplicated: Secondary | ICD-10-CM

## 2017-06-27 DIAGNOSIS — M1711 Unilateral primary osteoarthritis, right knee: Secondary | ICD-10-CM | POA: Diagnosis not present

## 2017-07-04 DIAGNOSIS — M1711 Unilateral primary osteoarthritis, right knee: Secondary | ICD-10-CM | POA: Diagnosis not present

## 2017-07-11 DIAGNOSIS — M1711 Unilateral primary osteoarthritis, right knee: Secondary | ICD-10-CM | POA: Diagnosis not present

## 2017-07-17 DIAGNOSIS — E6609 Other obesity due to excess calories: Secondary | ICD-10-CM | POA: Diagnosis not present

## 2017-07-17 DIAGNOSIS — M5136 Other intervertebral disc degeneration, lumbar region: Secondary | ICD-10-CM | POA: Diagnosis not present

## 2017-07-17 DIAGNOSIS — Z6835 Body mass index (BMI) 35.0-35.9, adult: Secondary | ICD-10-CM | POA: Diagnosis not present

## 2017-07-25 DIAGNOSIS — M48061 Spinal stenosis, lumbar region without neurogenic claudication: Secondary | ICD-10-CM | POA: Diagnosis not present

## 2017-07-25 DIAGNOSIS — M5126 Other intervertebral disc displacement, lumbar region: Secondary | ICD-10-CM | POA: Diagnosis not present

## 2017-07-25 DIAGNOSIS — Z6835 Body mass index (BMI) 35.0-35.9, adult: Secondary | ICD-10-CM | POA: Diagnosis not present

## 2017-07-25 DIAGNOSIS — M47816 Spondylosis without myelopathy or radiculopathy, lumbar region: Secondary | ICD-10-CM | POA: Diagnosis not present

## 2017-07-25 DIAGNOSIS — M5136 Other intervertebral disc degeneration, lumbar region: Secondary | ICD-10-CM | POA: Diagnosis not present

## 2017-07-25 DIAGNOSIS — E6609 Other obesity due to excess calories: Secondary | ICD-10-CM | POA: Diagnosis not present

## 2017-07-25 DIAGNOSIS — Z981 Arthrodesis status: Secondary | ICD-10-CM | POA: Diagnosis not present

## 2017-08-29 DIAGNOSIS — Z6832 Body mass index (BMI) 32.0-32.9, adult: Secondary | ICD-10-CM | POA: Diagnosis not present

## 2017-08-29 DIAGNOSIS — M4317 Spondylolisthesis, lumbosacral region: Secondary | ICD-10-CM | POA: Diagnosis not present

## 2017-08-29 DIAGNOSIS — M5416 Radiculopathy, lumbar region: Secondary | ICD-10-CM | POA: Diagnosis not present

## 2017-08-29 DIAGNOSIS — R030 Elevated blood-pressure reading, without diagnosis of hypertension: Secondary | ICD-10-CM | POA: Diagnosis not present

## 2017-08-29 DIAGNOSIS — M48061 Spinal stenosis, lumbar region without neurogenic claudication: Secondary | ICD-10-CM | POA: Diagnosis not present

## 2017-09-06 DIAGNOSIS — E6609 Other obesity due to excess calories: Secondary | ICD-10-CM | POA: Diagnosis not present

## 2017-09-06 DIAGNOSIS — Z6834 Body mass index (BMI) 34.0-34.9, adult: Secondary | ICD-10-CM | POA: Diagnosis not present

## 2017-09-06 DIAGNOSIS — Z1389 Encounter for screening for other disorder: Secondary | ICD-10-CM | POA: Diagnosis not present

## 2017-09-06 DIAGNOSIS — J329 Chronic sinusitis, unspecified: Secondary | ICD-10-CM | POA: Diagnosis not present

## 2017-09-06 DIAGNOSIS — J042 Acute laryngotracheitis: Secondary | ICD-10-CM | POA: Diagnosis not present

## 2017-09-26 DIAGNOSIS — Z1283 Encounter for screening for malignant neoplasm of skin: Secondary | ICD-10-CM | POA: Diagnosis not present

## 2017-09-26 DIAGNOSIS — X32XXXD Exposure to sunlight, subsequent encounter: Secondary | ICD-10-CM | POA: Diagnosis not present

## 2017-09-26 DIAGNOSIS — C4441 Basal cell carcinoma of skin of scalp and neck: Secondary | ICD-10-CM | POA: Diagnosis not present

## 2017-09-26 DIAGNOSIS — L570 Actinic keratosis: Secondary | ICD-10-CM | POA: Diagnosis not present

## 2017-11-21 DIAGNOSIS — Z85828 Personal history of other malignant neoplasm of skin: Secondary | ICD-10-CM | POA: Diagnosis not present

## 2017-11-21 DIAGNOSIS — Z08 Encounter for follow-up examination after completed treatment for malignant neoplasm: Secondary | ICD-10-CM | POA: Diagnosis not present

## 2017-11-22 DIAGNOSIS — Z719 Counseling, unspecified: Secondary | ICD-10-CM | POA: Diagnosis not present

## 2017-11-22 DIAGNOSIS — J069 Acute upper respiratory infection, unspecified: Secondary | ICD-10-CM | POA: Diagnosis not present

## 2017-11-22 DIAGNOSIS — E6609 Other obesity due to excess calories: Secondary | ICD-10-CM | POA: Diagnosis not present

## 2017-11-22 DIAGNOSIS — J0100 Acute maxillary sinusitis, unspecified: Secondary | ICD-10-CM | POA: Diagnosis not present

## 2017-11-22 DIAGNOSIS — Z1389 Encounter for screening for other disorder: Secondary | ICD-10-CM | POA: Diagnosis not present

## 2017-11-22 DIAGNOSIS — Z6831 Body mass index (BMI) 31.0-31.9, adult: Secondary | ICD-10-CM | POA: Diagnosis not present

## 2018-03-19 DIAGNOSIS — E6609 Other obesity due to excess calories: Secondary | ICD-10-CM | POA: Diagnosis not present

## 2018-03-19 DIAGNOSIS — Z6832 Body mass index (BMI) 32.0-32.9, adult: Secondary | ICD-10-CM | POA: Diagnosis not present

## 2018-03-19 DIAGNOSIS — M5410 Radiculopathy, site unspecified: Secondary | ICD-10-CM | POA: Diagnosis not present

## 2018-03-19 DIAGNOSIS — M5136 Other intervertebral disc degeneration, lumbar region: Secondary | ICD-10-CM | POA: Diagnosis not present

## 2018-03-23 ENCOUNTER — Other Ambulatory Visit (HOSPITAL_COMMUNITY): Payer: Self-pay | Admitting: Family Medicine

## 2018-03-23 DIAGNOSIS — M5136 Other intervertebral disc degeneration, lumbar region: Secondary | ICD-10-CM

## 2018-03-29 ENCOUNTER — Ambulatory Visit (HOSPITAL_COMMUNITY)
Admission: RE | Admit: 2018-03-29 | Discharge: 2018-03-29 | Disposition: A | Discharge status: Home / Self Care | Payer: Medicare Other | Source: Ambulatory Visit | Attending: Family Medicine | Admitting: Family Medicine

## 2018-03-29 DIAGNOSIS — M48061 Spinal stenosis, lumbar region without neurogenic claudication: Secondary | ICD-10-CM | POA: Diagnosis not present

## 2018-03-29 DIAGNOSIS — M4316 Spondylolisthesis, lumbar region: Secondary | ICD-10-CM | POA: Diagnosis not present

## 2018-03-29 DIAGNOSIS — M5136 Other intervertebral disc degeneration, lumbar region: Secondary | ICD-10-CM | POA: Insufficient documentation

## 2018-03-29 DIAGNOSIS — Z981 Arthrodesis status: Secondary | ICD-10-CM | POA: Diagnosis not present

## 2018-03-29 DIAGNOSIS — M5126 Other intervertebral disc displacement, lumbar region: Secondary | ICD-10-CM | POA: Diagnosis not present

## 2018-03-29 DIAGNOSIS — M545 Low back pain: Secondary | ICD-10-CM | POA: Diagnosis not present

## 2018-04-13 DIAGNOSIS — Z6829 Body mass index (BMI) 29.0-29.9, adult: Secondary | ICD-10-CM | POA: Diagnosis not present

## 2018-04-13 DIAGNOSIS — R030 Elevated blood-pressure reading, without diagnosis of hypertension: Secondary | ICD-10-CM | POA: Diagnosis not present

## 2018-04-13 DIAGNOSIS — M4800 Spinal stenosis, site unspecified: Secondary | ICD-10-CM | POA: Diagnosis not present

## 2018-04-17 ENCOUNTER — Other Ambulatory Visit: Payer: Self-pay | Admitting: Neurosurgery

## 2018-04-18 ENCOUNTER — Other Ambulatory Visit: Payer: Self-pay | Admitting: Neurosurgery

## 2018-04-24 NOTE — Pre-Procedure Instructions (Signed)
RYDER MAN  04/24/2018      [place], [address] [place] [address] Phone: [phone] Fax: [place], [address] [place] [address] Phone: [phone] Fax: [phone]    Your procedure is scheduled on Thurs., Nov. 14, 2019 from 10:30AM-2:08PM  Report to [facility] Admitting Entrance "A" at 8:30AM  Call this number if you have problems the morning of surgery:  [phone]   Remember:  Do not eat or drink after midnight on Nov. 13th    Take these medicines the morning of surgery with A SIP OF WATER: Cyclobenzaprine (FLEXERIL) and Omeprazole (PRILOSEC)  If needed: Acetaminophen (TYLENOL)  As of today, stop taking all Other Aspirin Products, Vitamins, Fish oils, and Herbal medications. Also stop all NSAIDS i.e. Advil, Ibuprofen, Motrin, Aleve, Anaprox, Naproxen, BC, Goody Powders, and all Supplements.    Do not wear jewelry.  Do not wear lotions, powders, colognes, or deodorant.  Do not shave 48 hours prior to surgery.  Men may shave face.  Do not bring valuables to the hospital.  [facility] is not responsible for any belongings or valuables.  Contacts, dentures or bridgework may not be worn into surgery.  Leave your suitcase in the car.  After surgery it may be brought to your room.  For patients admitted to the hospital, discharge time will be determined by your treatment team.  Patients discharged the day of surgery will not be allowed to drive home.   Special instructions:   [facility]- Preparing For Surgery  Before surgery, you can play an important role. Because skin is not sterile, your skin needs to be as free of germs as possible. You can reduce the number of germs on your skin by washing with CHG (chlorahexidine gluconate) Soap before surgery.  CHG is an antiseptic cleaner which kills germs and bonds with the skin to  continue killing germs even after washing.    Oral Hygiene is also important to reduce your risk of infection.  Remember - BRUSH YOUR TEETH THE MORNING OF SURGERY WITH YOUR REGULAR TOOTHPASTE  Please do not use if you have an allergy to CHG or antibacterial soaps. If your skin becomes reddened/irritated stop using the CHG.  Do not shave (including legs and underarms) for at least 48 hours prior to first CHG shower. It is OK to shave your face.  Please follow these instructions carefully.   1. Shower the NIGHT BEFORE SURGERY and the MORNING OF SURGERY with CHG.   2. If you chose to wash your hair, wash your hair first as usual with your normal shampoo.  3. After you shampoo, rinse your hair and body thoroughly to remove the shampoo.  4. Use CHG as you would any other liquid soap. You can apply CHG directly to the skin and wash gently with a scrungie or a clean washcloth.   5. Apply the CHG Soap to your body ONLY FROM THE NECK DOWN.  Do not use on open wounds or open sores. Avoid contact with your eyes, ears, mouth and genitals (private parts). Wash Face and genitals (private parts)  with your normal soap.  6. Wash thoroughly, paying special attention to the area where your surgery will be performed.  7. Thoroughly rinse your body with warm water from the neck down.  8. DO NOT shower/wash with your normal soap after using  and rinsing off the CHG Soap.  9. Pat yourself dry with a CLEAN TOWEL.  10. Wear CLEAN PAJAMAS to bed the night before surgery, wear comfortable clothes the morning of surgery  11. Place CLEAN SHEETS on your bed the night of your first shower and DO NOT SLEEP WITH PETS.  Day of Surgery:  Do not apply any deodorants/lotions.  Please wear clean clothes to the hospital/surgery center.   Remember to brush your teeth WITH YOUR REGULAR TOOTHPASTE.  Please read over the following fact sheets that you were given. Pain Booklet, Coughing and Deep Breathing, MRSA  Information and Surgical Site Infection Prevention

## 2018-04-25 ENCOUNTER — Other Ambulatory Visit: Payer: Self-pay

## 2018-04-25 ENCOUNTER — Encounter (HOSPITAL_COMMUNITY)
Admission: RE | Admit: 2018-04-25 | Discharge: 2018-04-25 | Disposition: A | Discharge status: Home / Self Care | Payer: Medicare Other | Source: Ambulatory Visit | Attending: Neurosurgery | Admitting: Neurosurgery

## 2018-04-25 ENCOUNTER — Encounter (HOSPITAL_COMMUNITY): Payer: Self-pay

## 2018-04-25 DIAGNOSIS — Z01812 Encounter for preprocedural laboratory examination: Secondary | ICD-10-CM

## 2018-04-25 LAB — CBC
HCT: 48.1 % (ref 39.0–52.0)
Hemoglobin: 15.6 g/dL (ref 13.0–17.0)
MCH: 30 pg (ref 26.0–34.0)
MCHC: 32.4 g/dL (ref 30.0–36.0)
MCV: 92.5 fL (ref 80.0–100.0)
Platelets: 212 10*3/uL (ref 150–400)
RBC: 5.2 MIL/uL (ref 4.22–5.81)
RDW: 12.1 % (ref 11.5–15.5)
WBC: 5.1 10*3/uL (ref 4.0–10.5)
nRBC: 0 % (ref 0.0–0.2)

## 2018-04-25 LAB — TYPE AND SCREEN
ABO/RH(D): O POS
Antibody Screen: NEGATIVE

## 2018-04-25 LAB — BASIC METABOLIC PANEL
Anion gap: 10 (ref 5–15)
BUN: 14 mg/dL (ref 8–23)
CO2: 22 mmol/L (ref 22–32)
Calcium: 8.9 mg/dL (ref 8.9–10.3)
Chloride: 107 mmol/L (ref 98–111)
Creatinine, Ser: 1.25 mg/dL — ABNORMAL HIGH (ref 0.61–1.24)
GFR calc Af Amer: >60 mL/min (ref >60)
GFR calc non Af Amer: 58 mL/min — ABNORMAL LOW (ref >60)
Glucose, Bld: 66 mg/dL — ABNORMAL LOW (ref 70–99)
Potassium: 4 mmol/L (ref 3.5–5.1)
Sodium: 139 mmol/L (ref 135–145)

## 2018-04-25 LAB — SURGICAL PCR SCREEN
MRSA, PCR: NEGATIVE
Staphylococcus aureus: NEGATIVE

## 2018-04-25 NOTE — Progress Notes (Signed)
PCP - Dr. Joeseph Amor  Cardiologist - Denies  Chest x-ray - Denies  EKG - Denies  Stress Test - Denies  ECHO - Denies  Cardiac Cath - Denies  AICD- na PM- na LOOP- na  Sleep Study - Yes- Positive CPAP - None  LABS- 04/25/18: CBC, BMP, T/S, PCR  ASA- Denies   Anesthesia- No  Pt denies having chest pain, sob, or fever at this time. All instructions explained to the pt, with a verbal understanding of the material. Pt agrees to go over the instructions while at home for a better understanding. The opportunity to ask questions was provided.

## 2018-04-26 ENCOUNTER — Inpatient Hospital Stay (HOSPITAL_COMMUNITY): Payer: Medicare Other

## 2018-04-26 ENCOUNTER — Encounter (HOSPITAL_COMMUNITY): Payer: Self-pay

## 2018-04-26 ENCOUNTER — Inpatient Hospital Stay (HOSPITAL_COMMUNITY): Payer: Medicare Other | Admitting: Anesthesiology

## 2018-04-26 ENCOUNTER — Inpatient Hospital Stay (HOSPITAL_COMMUNITY)
Admission: RE | Admit: 2018-04-26 | Discharge: 2018-04-27 | DRG: 454 | Disposition: A | Discharge status: Home / Self Care | Payer: Medicare Other | Attending: Neurosurgery | Admitting: Neurosurgery

## 2018-04-26 ENCOUNTER — Encounter (HOSPITAL_COMMUNITY)
Admission: RE | Disposition: A | Discharge status: Home / Self Care | Payer: Self-pay | Source: Home / Self Care | Attending: Neurosurgery

## 2018-04-26 DIAGNOSIS — I452 Bifascicular block: Secondary | ICD-10-CM | POA: Diagnosis not present

## 2018-04-26 DIAGNOSIS — Z79899 Other long term (current) drug therapy: Secondary | ICD-10-CM

## 2018-04-26 DIAGNOSIS — J45909 Unspecified asthma, uncomplicated: Secondary | ICD-10-CM | POA: Diagnosis present

## 2018-04-26 DIAGNOSIS — G8929 Other chronic pain: Secondary | ICD-10-CM | POA: Diagnosis present

## 2018-04-26 DIAGNOSIS — G4733 Obstructive sleep apnea (adult) (pediatric): Secondary | ICD-10-CM | POA: Diagnosis present

## 2018-04-26 DIAGNOSIS — Z8249 Family history of ischemic heart disease and other diseases of the circulatory system: Secondary | ICD-10-CM | POA: Diagnosis not present

## 2018-04-26 DIAGNOSIS — Z791 Long term (current) use of non-steroidal anti-inflammatories (NSAID): Secondary | ICD-10-CM | POA: Diagnosis not present

## 2018-04-26 DIAGNOSIS — Z981 Arthrodesis status: Secondary | ICD-10-CM

## 2018-04-26 DIAGNOSIS — Z419 Encounter for procedure for purposes other than remedying health state, unspecified: Secondary | ICD-10-CM

## 2018-04-26 DIAGNOSIS — Z96653 Presence of artificial knee joint, bilateral: Secondary | ICD-10-CM | POA: Diagnosis not present

## 2018-04-26 DIAGNOSIS — M4326 Fusion of spine, lumbar region: Secondary | ICD-10-CM | POA: Diagnosis not present

## 2018-04-26 DIAGNOSIS — Z882 Allergy status to sulfonamides status: Secondary | ICD-10-CM

## 2018-04-26 DIAGNOSIS — K219 Gastro-esophageal reflux disease without esophagitis: Secondary | ICD-10-CM | POA: Diagnosis present

## 2018-04-26 DIAGNOSIS — E785 Hyperlipidemia, unspecified: Secondary | ICD-10-CM | POA: Diagnosis present

## 2018-04-26 DIAGNOSIS — M5116 Intervertebral disc disorders with radiculopathy, lumbar region: Secondary | ICD-10-CM | POA: Diagnosis present

## 2018-04-26 DIAGNOSIS — Z885 Allergy status to narcotic agent status: Secondary | ICD-10-CM | POA: Diagnosis not present

## 2018-04-26 DIAGNOSIS — M48062 Spinal stenosis, lumbar region with neurogenic claudication: Secondary | ICD-10-CM | POA: Diagnosis not present

## 2018-04-26 DIAGNOSIS — F17290 Nicotine dependence, other tobacco product, uncomplicated: Secondary | ICD-10-CM | POA: Diagnosis present

## 2018-04-26 DIAGNOSIS — Z823 Family history of stroke: Secondary | ICD-10-CM

## 2018-04-26 DIAGNOSIS — M545 Low back pain: Secondary | ICD-10-CM | POA: Diagnosis not present

## 2018-04-26 HISTORY — DX: Spinal stenosis, site unspecified: M48.00

## 2018-04-26 HISTORY — DX: Reserved for concepts with insufficient information to code with codable children: IMO0002

## 2018-04-26 SURGERY — POSTERIOR LUMBAR FUSION 1 LEVEL
Anesthesia: General | Site: Spine Lumbar

## 2018-04-26 MED ORDER — ONDANSETRON HCL 4 MG/2ML IJ SOLN
INTRAMUSCULAR | Status: AC
  Filled 2018-04-26: qty 4

## 2018-04-26 MED ORDER — LIDOCAINE-EPINEPHRINE 1 %-1:100000 IJ SOLN
INTRAMUSCULAR | Status: AC
  Filled 2018-04-26: qty 1

## 2018-04-26 MED ORDER — THROMBIN 5000 UNITS EX SOLR
OROMUCOSAL | Status: DC | PRN
  Administered 2018-04-26: 5 mL via TOPICAL

## 2018-04-26 MED ORDER — BUPIVACAINE LIPOSOME 1.3 % IJ SUSP
20.0000 mL | Freq: Once | INTRAMUSCULAR | Status: DC
  Filled 2018-04-26: qty 20

## 2018-04-26 MED ORDER — ACETAMINOPHEN 650 MG RE SUPP: 650.0000 mg | RECTAL | Status: DC | PRN

## 2018-04-26 MED ORDER — ROCURONIUM BROMIDE 50 MG/5ML IV SOSY
PREFILLED_SYRINGE | INTRAVENOUS | Status: AC
  Filled 2018-04-26: qty 10

## 2018-04-26 MED ORDER — BACITRACIN ZINC 500 UNIT/GM EX OINT
TOPICAL_OINTMENT | CUTANEOUS | Status: DC | PRN
  Administered 2018-04-26: 1 via TOPICAL

## 2018-04-26 MED ORDER — DEXAMETHASONE SODIUM PHOSPHATE 10 MG/ML IJ SOLN
INTRAMUSCULAR | Status: DC | PRN
  Administered 2018-04-26: 10 mg via INTRAVENOUS

## 2018-04-26 MED ORDER — THROMBIN 5000 UNITS EX SOLR
CUTANEOUS | Status: AC
  Filled 2018-04-26: qty 5000

## 2018-04-26 MED ORDER — ONDANSETRON HCL 4 MG/2ML IJ SOLN
INTRAMUSCULAR | Status: DC | PRN
  Administered 2018-04-26: 4 mg via INTRAVENOUS

## 2018-04-26 MED ORDER — CHLORHEXIDINE GLUCONATE CLOTH 2 % EX PADS: 6.0000 | MEDICATED_PAD | Freq: Once | CUTANEOUS | Status: DC

## 2018-04-26 MED ORDER — PHENYLEPHRINE 40 MCG/ML (10ML) SYRINGE FOR IV PUSH (FOR BLOOD PRESSURE SUPPORT)
PREFILLED_SYRINGE | INTRAVENOUS | Status: AC
  Filled 2018-04-26: qty 10

## 2018-04-26 MED ORDER — CYCLOBENZAPRINE HCL 10 MG PO TABS
10.0000 mg | ORAL_TABLET | Freq: Three times a day (TID) | ORAL | Status: DC | PRN
  Administered 2018-04-26: 10 mg via ORAL

## 2018-04-26 MED ORDER — ACETAMINOPHEN 325 MG PO TABS: 650.0000 mg | ORAL_TABLET | ORAL | Status: DC | PRN

## 2018-04-26 MED ORDER — OXYCODONE HCL 5 MG/5ML PO SOLN: 5.0000 mg | Freq: Once | ORAL | Status: AC | PRN

## 2018-04-26 MED ORDER — DOCUSATE SODIUM 100 MG PO CAPS
100.0000 mg | ORAL_CAPSULE | Freq: Two times a day (BID) | ORAL | Status: DC
  Administered 2018-04-26: 100 mg via ORAL
  Filled 2018-04-26: qty 1

## 2018-04-26 MED ORDER — HYDROMORPHONE HCL 1 MG/ML IJ SOLN: 1.0000 mg | INTRAMUSCULAR | Status: DC | PRN

## 2018-04-26 MED ORDER — LIDOCAINE 2% (20 MG/ML) 5 ML SYRINGE
INTRAMUSCULAR | Status: DC | PRN
  Administered 2018-04-26: 100 mg via INTRAVENOUS

## 2018-04-26 MED ORDER — PANTOPRAZOLE SODIUM 40 MG PO TBEC: 40.0000 mg | DELAYED_RELEASE_TABLET | Freq: Every day | ORAL | Status: DC

## 2018-04-26 MED ORDER — CYCLOBENZAPRINE HCL 10 MG PO TABS
ORAL_TABLET | ORAL | Status: AC
  Filled 2018-04-26: qty 1

## 2018-04-26 MED ORDER — BUPIVACAINE LIPOSOME 1.3 % IJ SUSP
INTRAMUSCULAR | Status: DC | PRN
  Administered 2018-04-26: 20 mL

## 2018-04-26 MED ORDER — SUGAMMADEX SODIUM 500 MG/5ML IV SOLN
INTRAVENOUS | Status: AC
  Filled 2018-04-26: qty 5

## 2018-04-26 MED ORDER — OXYCODONE HCL 5 MG PO TABS
5.0000 mg | ORAL_TABLET | Freq: Once | ORAL | Status: AC | PRN
  Administered 2018-04-26: 5 mg via ORAL

## 2018-04-26 MED ORDER — OXYCODONE HCL 5 MG PO TABS
ORAL_TABLET | ORAL | Status: AC
  Filled 2018-04-26: qty 1

## 2018-04-26 MED ORDER — 0.9 % SODIUM CHLORIDE (POUR BTL) OPTIME
TOPICAL | Status: DC | PRN
  Administered 2018-04-26: 1000 mL

## 2018-04-26 MED ORDER — OXYCODONE HCL 5 MG PO TABS: 5.0000 mg | ORAL_TABLET | ORAL | Status: DC | PRN

## 2018-04-26 MED ORDER — VANCOMYCIN HCL 1000 MG IV SOLR
INTRAVENOUS | Status: AC
  Filled 2018-04-26: qty 1000

## 2018-04-26 MED ORDER — VANCOMYCIN HCL 1 G IV SOLR
INTRAVENOUS | Status: DC | PRN
  Administered 2018-04-26: 1000 mg via TOPICAL

## 2018-04-26 MED ORDER — SODIUM CHLORIDE 0.9% FLUSH: 3.0000 mL | INTRAVENOUS | Status: DC | PRN

## 2018-04-26 MED ORDER — LIDOCAINE-EPINEPHRINE 1 %-1:100000 IJ SOLN
INTRAMUSCULAR | Status: DC | PRN
  Administered 2018-04-26: 10 mL

## 2018-04-26 MED ORDER — FENTANYL CITRATE (PF) 100 MCG/2ML IJ SOLN: 25.0000 ug | INTRAMUSCULAR | Status: DC | PRN

## 2018-04-26 MED ORDER — CEFAZOLIN SODIUM-DEXTROSE 2-4 GM/100ML-% IV SOLN
2.0000 g | INTRAVENOUS | Status: AC
  Administered 2018-04-26: 2 g via INTRAVENOUS
  Administered 2018-04-26: 1 g via INTRAVENOUS

## 2018-04-26 MED ORDER — EPHEDRINE SULFATE 50 MG/ML IJ SOLN
INTRAMUSCULAR | Status: DC | PRN
  Administered 2018-04-26: 5 mg via INTRAVENOUS
  Administered 2018-04-26 (×3): 10 mg via INTRAVENOUS
  Administered 2018-04-26 (×3): 5 mg via INTRAVENOUS

## 2018-04-26 MED ORDER — SODIUM CHLORIDE 0.9% FLUSH: 3.0000 mL | Freq: Two times a day (BID) | INTRAVENOUS | Status: DC

## 2018-04-26 MED ORDER — BISACODYL 10 MG RE SUPP: 10.0000 mg | Freq: Every day | RECTAL | Status: DC | PRN

## 2018-04-26 MED ORDER — ONDANSETRON HCL 4 MG/2ML IJ SOLN: 4.0000 mg | Freq: Four times a day (QID) | INTRAMUSCULAR | Status: DC | PRN

## 2018-04-26 MED ORDER — CEFAZOLIN SODIUM-DEXTROSE 2-4 GM/100ML-% IV SOLN
INTRAVENOUS | Status: AC
  Filled 2018-04-26: qty 100

## 2018-04-26 MED ORDER — ROCURONIUM BROMIDE 10 MG/ML (PF) SYRINGE
PREFILLED_SYRINGE | INTRAVENOUS | Status: DC | PRN
  Administered 2018-04-26: 50 mg via INTRAVENOUS
  Administered 2018-04-26: 30 mg via INTRAVENOUS
  Administered 2018-04-26: 50 mg via INTRAVENOUS
  Administered 2018-04-26: 20 mg via INTRAVENOUS

## 2018-04-26 MED ORDER — CEFAZOLIN SODIUM-DEXTROSE 2-4 GM/100ML-% IV SOLN
2.0000 g | Freq: Three times a day (TID) | INTRAVENOUS | Status: AC
  Administered 2018-04-26 – 2018-04-27 (×2): 2 g via INTRAVENOUS
  Filled 2018-04-26 (×2): qty 100

## 2018-04-26 MED ORDER — PROPOFOL 10 MG/ML IV BOLUS
INTRAVENOUS | Status: AC
  Filled 2018-04-26: qty 20

## 2018-04-26 MED ORDER — FENTANYL CITRATE (PF) 250 MCG/5ML IJ SOLN
INTRAMUSCULAR | Status: DC | PRN
  Administered 2018-04-26: 100 ug via INTRAVENOUS
  Administered 2018-04-26: 50 ug via INTRAVENOUS
  Administered 2018-04-26: 100 ug via INTRAVENOUS

## 2018-04-26 MED ORDER — MIDAZOLAM HCL 5 MG/5ML IJ SOLN
INTRAMUSCULAR | Status: DC | PRN
  Administered 2018-04-26: 2 mg via INTRAVENOUS

## 2018-04-26 MED ORDER — BACITRACIN ZINC 500 UNIT/GM EX OINT
TOPICAL_OINTMENT | CUTANEOUS | Status: AC
  Filled 2018-04-26: qty 28.35

## 2018-04-26 MED ORDER — SUGAMMADEX SODIUM 200 MG/2ML IV SOLN
INTRAVENOUS | Status: DC | PRN
  Administered 2018-04-26: 200 mg via INTRAVENOUS

## 2018-04-26 MED ORDER — ACETAMINOPHEN 500 MG PO TABS
1000.0000 mg | ORAL_TABLET | Freq: Four times a day (QID) | ORAL | Status: DC
  Administered 2018-04-26 – 2018-04-27 (×3): 1000 mg via ORAL
  Filled 2018-04-26 (×3): qty 2

## 2018-04-26 MED ORDER — SODIUM CHLORIDE (PF) 0.9 % IJ SOLN
INTRAMUSCULAR | Status: AC
  Filled 2018-04-26: qty 10

## 2018-04-26 MED ORDER — MENTHOL 3 MG MT LOZG: 1.0000 | LOZENGE | OROMUCOSAL | Status: DC | PRN

## 2018-04-26 MED ORDER — LIDOCAINE 2% (20 MG/ML) 5 ML SYRINGE
INTRAMUSCULAR | Status: AC
  Filled 2018-04-26: qty 10

## 2018-04-26 MED ORDER — SODIUM CHLORIDE 0.9 % IV SOLN: 250.0000 mL | INTRAVENOUS | Status: DC

## 2018-04-26 MED ORDER — OXYCODONE HCL 5 MG PO TABS
10.0000 mg | ORAL_TABLET | ORAL | Status: DC | PRN
  Administered 2018-04-26: 10 mg via ORAL
  Filled 2018-04-26: qty 2

## 2018-04-26 MED ORDER — LACTATED RINGERS IV SOLN
INTRAVENOUS | Status: DC
  Administered 2018-04-26 (×3): via INTRAVENOUS

## 2018-04-26 MED ORDER — DEXAMETHASONE SODIUM PHOSPHATE 10 MG/ML IJ SOLN
INTRAMUSCULAR | Status: AC
  Filled 2018-04-26: qty 2

## 2018-04-26 MED ORDER — MIDAZOLAM HCL 2 MG/2ML IJ SOLN
INTRAMUSCULAR | Status: AC
  Filled 2018-04-26: qty 2

## 2018-04-26 MED ORDER — ONDANSETRON HCL 4 MG/2ML IJ SOLN: 4.0000 mg | Freq: Once | INTRAMUSCULAR | Status: DC | PRN

## 2018-04-26 MED ORDER — GABAPENTIN 600 MG PO TABS
600.0000 mg | ORAL_TABLET | Freq: Every day | ORAL | Status: DC
  Administered 2018-04-26: 600 mg via ORAL
  Filled 2018-04-26: qty 1

## 2018-04-26 MED ORDER — PHENOL 1.4 % MT LIQD: 1.0000 | OROMUCOSAL | Status: DC | PRN

## 2018-04-26 MED ORDER — SODIUM CHLORIDE 0.9 % IV SOLN
INTRAVENOUS | Status: DC | PRN
  Administered 2018-04-26: 500 mL

## 2018-04-26 MED ORDER — PHENYLEPHRINE 40 MCG/ML (10ML) SYRINGE FOR IV PUSH (FOR BLOOD PRESSURE SUPPORT)
PREFILLED_SYRINGE | INTRAVENOUS | Status: DC | PRN
  Administered 2018-04-26 (×4): 40 ug via INTRAVENOUS

## 2018-04-26 MED ORDER — PROPOFOL 10 MG/ML IV BOLUS
INTRAVENOUS | Status: DC | PRN
  Administered 2018-04-26: 150 mg via INTRAVENOUS

## 2018-04-26 MED ORDER — FENTANYL CITRATE (PF) 250 MCG/5ML IJ SOLN
INTRAMUSCULAR | Status: AC
  Filled 2018-04-26: qty 5

## 2018-04-26 MED ORDER — ONDANSETRON HCL 4 MG PO TABS: 4.0000 mg | ORAL_TABLET | Freq: Four times a day (QID) | ORAL | Status: DC | PRN

## 2018-04-26 SURGICAL SUPPLY — 71 items
BAG DECANTER FOR FLEXI CONT (MISCELLANEOUS) ×3 IMPLANT
BENZOIN TINCTURE PRP APPL 2/3 (GAUZE/BANDAGES/DRESSINGS) ×3 IMPLANT
BLADE CLIPPER SURG (BLADE) IMPLANT
BUR MATCHSTICK NEURO 3.0 LAGG (BURR) ×3 IMPLANT
BUR PRECISION FLUTE 6.0 (BURR) ×3 IMPLANT
CANISTER SUCT 3000ML PPV (MISCELLANEOUS) ×3 IMPLANT
CARTRIDGE OIL MAESTRO DRILL (MISCELLANEOUS) ×1 IMPLANT
CLOSURE WOUND 1/2 X4 (GAUZE/BANDAGES/DRESSINGS) ×1
CONT SPEC 4OZ CLIKSEAL STRL BL (MISCELLANEOUS) ×3 IMPLANT
COVER BACK TABLE 60X90IN (DRAPES) ×3 IMPLANT
COVER WAND RF STERILE (DRAPES) ×3 IMPLANT
DECANTER SPIKE VIAL GLASS SM (MISCELLANEOUS) ×3 IMPLANT
DIFFUSER DRILL AIR PNEUMATIC (MISCELLANEOUS) ×3 IMPLANT
DRAPE C-ARM 42X72 X-RAY (DRAPES) ×6 IMPLANT
DRAPE HALF SHEET 40X57 (DRAPES) ×3 IMPLANT
DRAPE LAPAROTOMY 100X72X124 (DRAPES) ×3 IMPLANT
DRAPE SURG 17X23 STRL (DRAPES) ×12 IMPLANT
DRSG OPSITE POSTOP 4X8 (GAUZE/BANDAGES/DRESSINGS) ×3 IMPLANT
ELECT BLADE 4.0 EZ CLEAN MEGAD (MISCELLANEOUS) ×3
ELECT REM PT RETURN 9FT ADLT (ELECTROSURGICAL) ×3
ELECTRODE BLDE 4.0 EZ CLN MEGD (MISCELLANEOUS) ×1 IMPLANT
ELECTRODE REM PT RTRN 9FT ADLT (ELECTROSURGICAL) ×1 IMPLANT
EVACUATOR 1/8 PVC DRAIN (DRAIN) IMPLANT
GAUZE 4X4 16PLY RFD (DISPOSABLE) IMPLANT
GAUZE SPONGE 4X4 12PLY STRL (GAUZE/BANDAGES/DRESSINGS) IMPLANT
GLOVE BIO SURGEON STRL SZ 6.5 (GLOVE) ×2 IMPLANT
GLOVE BIO SURGEON STRL SZ8 (GLOVE) ×6 IMPLANT
GLOVE BIO SURGEON STRL SZ8.5 (GLOVE) ×6 IMPLANT
GLOVE BIO SURGEONS STRL SZ 6.5 (GLOVE) ×1
GLOVE BIOGEL PI IND STRL 6.5 (GLOVE) ×2 IMPLANT
GLOVE BIOGEL PI IND STRL 7.5 (GLOVE) ×1 IMPLANT
GLOVE BIOGEL PI INDICATOR 6.5 (GLOVE) ×4
GLOVE BIOGEL PI INDICATOR 7.5 (GLOVE) ×2
GLOVE EXAM NITRILE XL STR (GLOVE) IMPLANT
GLOVE SURG SS PI 6.0 STRL IVOR (GLOVE) ×3 IMPLANT
GLOVE SURG SS PI 7.0 STRL IVOR (GLOVE) ×15 IMPLANT
GOWN STRL REUS W/ TWL LRG LVL3 (GOWN DISPOSABLE) ×2 IMPLANT
GOWN STRL REUS W/ TWL XL LVL3 (GOWN DISPOSABLE) ×3 IMPLANT
GOWN STRL REUS W/TWL 2XL LVL3 (GOWN DISPOSABLE) IMPLANT
GOWN STRL REUS W/TWL LRG LVL3 (GOWN DISPOSABLE) ×4
GOWN STRL REUS W/TWL XL LVL3 (GOWN DISPOSABLE) ×6
HEMOSTAT POWDER KIT SURGIFOAM (HEMOSTASIS) ×3 IMPLANT
KIT BASIN OR (CUSTOM PROCEDURE TRAY) ×3 IMPLANT
KIT INFUSE X SMALL 1.4CC (Orthopedic Implant) ×3 IMPLANT
KIT TURNOVER KIT B (KITS) ×3 IMPLANT
MILL MEDIUM DISP (BLADE) IMPLANT
NEEDLE HYPO 21X1.5 SAFETY (NEEDLE) ×3 IMPLANT
NEEDLE HYPO 22GX1.5 SAFETY (NEEDLE) ×3 IMPLANT
NS IRRIG 1000ML POUR BTL (IV SOLUTION) ×3 IMPLANT
OIL CARTRIDGE MAESTRO DRILL (MISCELLANEOUS) ×3
PACK LAMINECTOMY NEURO (CUSTOM PROCEDURE TRAY) ×3 IMPLANT
PAD ARMBOARD 7.5X6 YLW CONV (MISCELLANEOUS) ×15 IMPLANT
PATTIES SURGICAL .5 X1 (DISPOSABLE) IMPLANT
ROD PREBENT 6.35X60 (Rod) ×3 IMPLANT
ROD PREBENT 6.35X70 (Rod) ×3 IMPLANT
SCREW PEDICLE VA L635 7.5X55M (Screw) ×6 IMPLANT
SCREW SET BREAK OFF (Screw) ×18 IMPLANT
SPACER ALTERA 10X31-15 (Spacer) ×3 IMPLANT
SPONGE LAP 4X18 RFD (DISPOSABLE) IMPLANT
SPONGE NEURO XRAY DETECT 1X3 (DISPOSABLE) IMPLANT
SPONGE SURGIFOAM ABS GEL 100 (HEMOSTASIS) IMPLANT
STRIP BIOACTIVE 10CC 25X50X8 (Miscellaneous) ×6 IMPLANT
STRIP CLOSURE SKIN 1/2X4 (GAUZE/BANDAGES/DRESSINGS) ×2 IMPLANT
SUT VIC AB 1 CT1 18XBRD ANBCTR (SUTURE) ×2 IMPLANT
SUT VIC AB 1 CT1 8-18 (SUTURE) ×4
SUT VIC AB 2-0 CP2 18 (SUTURE) ×6 IMPLANT
SYR 20CC LL (SYRINGE) ×3 IMPLANT
TOWEL GREEN STERILE (TOWEL DISPOSABLE) ×3 IMPLANT
TOWEL GREEN STERILE FF (TOWEL DISPOSABLE) ×3 IMPLANT
TRAY FOLEY MTR SLVR 16FR STAT (SET/KITS/TRAYS/PACK) ×3 IMPLANT
WATER STERILE IRR 1000ML POUR (IV SOLUTION) ×3 IMPLANT

## 2018-04-26 NOTE — Op Note (Signed)
Brief history: The patient is a 67 year old white male whose had a previous L4-5 instrumented fusion by Dr. Joya Salm..  The patient has some chronic back pain.  This has gotten worse.  He was worked up with a lumbar MRI which demonstrated severe stenosis at L3-4.  I discussed the various treatment options with the patient including surgery.  He has weighed the risks, benefits, and alternatives to surgery and decided to proceed with an L3-4 decompression, instrumentation and fusion.  Preoperative diagnosis: L3-4 degenerative disc disease, spinal stenosis compressing both the L3 and the L4 nerve roots; lumbago; lumbar radiculopathy; neurogenic claudication  Postoperative diagnosis: The same  Procedure: Bilateral L3-4 laminotomy/foraminotomies/medial facetectomy to decompress the bilateral L3 and L4 nerve roots(the work required to do this was in addition to the work required to do the posterior lumbar interbody fusion because of the patient's spinal stenosis, facet arthropathy. Etc. requiring a wide decompression of the nerve roots.);  L3-4 transforaminal lumbar interbody fusion with local morselized autograft bone and Kinnex graft extender; insertion of interbody prosthesis at L3-4 (globus peek expandable interbody prosthesis); posterior segmental instrumentation from L3 to L5 with globus titanium pedicle screws and rods; posterior lateral arthrodesis at L3-4 bilaterally with local morselized autograft bone and Kinnex bone graft extender; exploration of lumbar fusion  Surgeon: Dr. Earle Gell  Asst.: Arnetha Massy nurse practitioner  Anesthesia: Gen. endotracheal  Estimated blood loss: 250 cc  Drains: None  Complications: None  Description of procedure: The patient was brought to the operating room by the anesthesia team. General endotracheal anesthesia was induced. The patient was turned to the prone position on the Wilson frame. The patient's lumbosacral region was then prepared with Betadine  scrub and Betadine solution. Sterile drapes were applied.  I then injected the area to be incised with Marcaine with epinephrine solution. I then used the scalpel to make a linear midline incision over the L3-4 and L4-5 interspace. I then used electrocautery to perform a bilateral subperiosteal dissection exposing the spinous process and lamina of L3, L4 and L5 and exposing the old hardware at L4-5.We then inserted the Verstrac retractor to provide exposure.  We explored the fusion by removing the caps from the old screws removing the cross connector between the rods and then removing the rods.  Inspected the arthrodesis at L4-5.  It appeared solid.  We consider removing and replacing the left L4 pedicle screw, but decided against it because I think the much bigger problem was the severe stenosis at L3-4 and the screw  was quite overgrown with bone and the head appeared a bit stripped.  I did not think I could remove it without a whole lot of bone and metal drilling, and therefore the risk of removing it would outweigh the benefit.  I began the decompression by using the high speed drill to perform laminotomies at L3-4 bilaterally. We then used the Kerrison punches to widen the laminotomy and removed the ligamentum flavum at L3-4 bilaterally.  We encountered severe stenosis.  We used the Kerrison punches to remove the medial facets at L3-4 bilaterally. We performed wide foraminotomies about the bilateral L3 and L4 nerve roots completing the decompression.  We now turned our attention to the posterior lumbar interbody fusion. I used a scalpel to incise the intervertebral disc at L3-4 bilaterally. I then performed a partial intervertebral discectomy at L3-4 bilaterally using the pituitary forceps. We prepared the vertebral endplates at G8-1 bilaterally for the fusion by removing the soft tissues with the curettes. We  then used the trial spacers to pick the appropriate sized interbody prosthesis. We prefilled  his prosthesis with a combination of local morselized autograft bone that we obtained during the decompression as well as Kinnex bone graft extender. We inserted the prefilled prosthesis into the interspace at L3-4 from the left, we then turned and expanded the prosthesis. There was a good snug fit of the prosthesis in the interspace. We then filled and the remainder of the intervertebral disc space with local morselized autograft bone and Kinnex. This completed the posterior lumbar interbody arthrodesis.  We now turned attention to the instrumentation. Under fluoroscopic guidance we cannulated the bilateral L3 pedicles with the bone probe. We then removed the bone probe. We then tapped the pedicle with a 6.5 millimeter tap. We then removed the tap. We probed inside the tapped pedicle with a ball probe to rule out cortical breaches. We then inserted a 7.5 x 55 millimeter pedicle screw into the L3 pedicles bilaterally under fluoroscopic guidance. We then palpated along the medial aspect of the pedicles to rule out cortical breaches. There were none. The nerve roots were not injured. We then connected the unilateral pedicle screws with a lordotic rod. We compressed the construct and secured the rod in place with the caps. We then tightened the caps appropriately. This completed the instrumentation from L3-L5 bilaterally.  We now turned our attention to the posterior lateral arthrodesis at L3-4 bilaterally. We used the high-speed drill to decorticate the remainder of the facets, pars, transverse process at L3-4 bilaterally. We then applied a combination of local morselized autograft bone and Kinnex bone graft extender over these decorticated posterior lateral structures. This completed the posterior lateral arthrodesis.  We then obtained hemostasis using bipolar electrocautery. We irrigated the wound out with bacitracin solution. We inspected the thecal sac and nerve roots and noted they were well decompressed.  We then removed the retractor. We placed vancomycin powder in the wound.  We injected Exparel . We reapproximated patient's thoracolumbar fascia with interrupted #1 Vicryl suture. We reapproximated patient's subcutaneous tissue with interrupted 2-0 Vicryl suture. The reapproximated patient's skin with Steri-Strips and benzoin. The wound was then coated with bacitracin ointment. A sterile dressing was applied. The drapes were removed. The patient was subsequently returned to the supine position where they were extubated by the anesthesia team. He was then transported to the post anesthesia care unit in stable condition. All sponge instrument and needle counts were reportedly correct at the end of this case.

## 2018-04-26 NOTE — Anesthesia Procedure Notes (Signed)
Procedure Name: Intubation Date/Time: 04/26/2018 10:45 AM Performed by: Mariea Clonts, CRNA Pre-anesthesia Checklist: Patient identified, Emergency Drugs available, Suction available and Patient being monitored Patient Re-evaluated:Patient Re-evaluated prior to induction Oxygen Delivery Method: Circle System Utilized Preoxygenation: Pre-oxygenation with 100% oxygen Induction Type: IV induction Ventilation: Mask ventilation without difficulty Laryngoscope Size: Miller and 2 Grade View: Grade II Tube type: Oral Tube size: 7.5 mm Number of attempts: 1 Airway Equipment and Method: Stylet and Oral airway Placement Confirmation: ETT inserted through vocal cords under direct vision,  positive ETCO2 and breath sounds checked- equal and bilateral Tube secured with: Tape Dental Injury: Teeth and Oropharynx as per pre-operative assessment

## 2018-04-26 NOTE — H&P (Signed)
Subjective: The patient is a 67 year old white male whose had several prior back surgeries.  Most recently by Dr. Joya Salm.  The patient has developed recurrent back and leg pain consistent with neurogenic claudication.  He has failed medical management.  He was worked up with lumbar x-rays and lumbar MRI.  He has a herniated disc and severe stenosis at 3 4.  I discussed the various treatment options with him he has decided to proceed with surgery.  Past Medical History:  Diagnosis Date  . Adjacent segment disease with spinal stenosis   . Arthritis    "knees; back" (07/14/2014)  . Asthma   . Exertional dyspnea   . GERD (gastroesophageal reflux disease)   . Hyperlipidemia   . OSA on CPAP    no cpap  . Pneumonia 11/2013  . Primary localized osteoarthritis of left knee   . Seasonal allergies     Past Surgical History:  Procedure Laterality Date  . ANTERIOR CERVICAL DECOMP/DISCECTOMY FUSION  2009   Dr Joya Salm  . BACK SURGERY    . BUNIONECTOMY Right  2013  . COLONOSCOPY N/A 11/06/2012   Procedure: COLONOSCOPY;  Surgeon: Jamesetta So, MD;  Location: [facility];  Service: Gastroenterology;  Laterality: N/A;  . JOINT REPLACEMENT    . KNEE ARTHROSCOPY Bilateral 1990's  . [place]; 2009   Dr Joya Salm  . TONSILLECTOMY  1950's  . TOTAL KNEE ARTHROPLASTY Left 07/14/2014  . TOTAL KNEE ARTHROPLASTY Left 07/14/2014   Procedure: LEFT TOTAL KNEE ARTHROPLASTY;  Surgeon: [name], MD;  Location: [facility];  Service: Orthopedics;  Laterality: Left;    Allergies  Allergen Reactions  . Morphine And Related Nausea And Vomiting  . Sulfa Antibiotics Hives    Social History   Tobacco Use  . Smoking status: Current Some Day Smoker    Years: 30.00    Types: Cigars  . Smokeless tobacco: Never Used  . Tobacco comment: 07/14/2014 smoked cigs on and off x 40 yrs- none since 2014, but does smoke occ cigar-  Substance Use Topics  . Alcohol use: Yes    Alcohol/week: 0.0 standard drinks    Comment: occ    Family History  Problem Relation Age of Onset  . CAD Mother   . CVA Mother   . Dementia Father   . Colon cancer Neg Hx   . Colon polyps Neg Hx    Prior to Admission medications   Medication Sig Start Date End Date Taking? Authorizing Provider  acetaminophen (TYLENOL) 500 MG tablet Take 500-1,000 mg by mouth every 6 (six) hours as needed (for pain.).   Yes [provider]  Cholecalciferol (VITAMIN D3) 125 MCG (5000 UT) TBDP Take 5,000 Units by mouth daily.   Yes [provider]  cyclobenzaprine (FLEXERIL) 10 MG tablet Take 10 mg by mouth 2 (two) times daily.   Yes [provider]  gabapentin (NEURONTIN) 600 MG tablet Take 600 mg by mouth at bedtime.   Yes [provider]  ibuprofen (ADVIL,MOTRIN) 200 MG tablet Take 800 mg by mouth every 8 (eight) hours as needed (for pain.).   Yes [provider]  omeprazole (PRILOSEC) 20 MG capsule Take 20 mg by mouth daily before breakfast.   Yes [provider]  sildenafil (REVATIO) 20 MG tablet Take 40-100 mg by mouth daily as needed (erectile dysfunction).    [provider]     Review of Systems  Positive ROS: As above  All other systems have  been reviewed and were otherwise negative with the exception of those mentioned in the HPI and as above.  Objective: Vital signs in last 24 hours: Temp:  [97.7 F (36.5 C)-97.9 F (36.6 C)] 97.7 F (36.5 C) (11/14 0857) Pulse Rate:  [68-73] 73 (11/14 0857) Resp:  [18-20] 18 (11/14 0857) BP: (138-165)/(79-87) 165/87 (11/14 0857) SpO2:  [97 %-98 %] 97 % (11/14 0857) Weight:  [100.7 kg] 100.7 kg (11/14 0857) Estimated body mass index is 30.96 kg/m as calculated from the following:   Height as of this encounter: 5\' 11"  (1.803 m).   Weight as of this encounter: 100.7 kg.   General Appearance: Alert Head: Normocephalic, without obvious abnormality, atraumatic Eyes: PERRL, conjunctiva/corneas clear, EOM's intact,    Ears:  Normal  Throat: Normal  Neck: Supple, Back: His lumbar incision is well-healed.  Lungs: Clear to auscultation bilaterally, respirations unlabored Heart: Regular rate and rhythm, no murmur, rub or gallop Abdomen: Soft, non-tender Extremities: Extremities normal, atraumatic, no cyanosis or edema Skin: unremarkable  NEUROLOGIC:   Mental status: alert and oriented,Motor Exam - grossly normal Sensory Exam - grossly normal Reflexes:  Coordination - grossly normal Gait - grossly normal Balance - grossly normal Cranial Nerves: I: smell Not tested  II: visual acuity  OS: Normal  OD: Normal   II: visual fields Full to confrontation  II: pupils Equal, round, reactive to light  III,VII: ptosis None  III,IV,VI: extraocular muscles  Full ROM  V: mastication Normal  V: facial light touch sensation  Normal  V,VII: corneal reflex  Present  VII: facial muscle function - upper  Normal  VII: facial muscle function - lower Normal  VIII: hearing Not tested  IX: soft palate elevation  Normal  IX,X: gag reflex Present  XI: trapezius strength  5/5  XI: sternocleidomastoid strength 5/5  XI: neck flexion strength  5/5  XII: tongue strength  Normal    Data Review Lab Results  Component Value Date   WBC 5.1 04/25/2018   HGB 15.6 04/25/2018   HCT 48.1 04/25/2018   MCV 92.5 04/25/2018   PLT 212 04/25/2018   Lab Results  Component Value Date   NA 139 04/25/2018   K 4.0 04/25/2018   CL 107 04/25/2018   CO2 22 04/25/2018   BUN 14 04/25/2018   CREATININE 1.25 (H) 04/25/2018   GLUCOSE 66 (L) 04/25/2018   Lab Results  Component Value Date   INR 0.93 07/03/2014    Assessment/Plan: L3-4 degenerative disc disease, spinal stenosis, herniated disc, lumbago, lumbar radiculopathy, neurogenic medication: I have discussed the situation with the patient.  I reviewed his imaging studies with him and pointed out the abnormalities.  We have discussed the various treatment options including surgery.  I  have described the surgical treatment option of an L3-4 decompression instrumentation fusion with exploration of his old fusion.  I have shown him surgical models.  I have given him a surgical pamphlet.  We have discussed the risks, benefits, alternatives, expected postoperative course, and likelihood of achieving her goals with surgery.  I have answered all his questions.  He has decided to proceed with surgery.   Ophelia Charter 04/26/2018 10:27 AM

## 2018-04-26 NOTE — Anesthesia Preprocedure Evaluation (Signed)
Anesthesia Evaluation  Patient identified by MRN, date of birth, ID band Patient awake    Reviewed: Allergy & Precautions, NPO status , Patient's Chart, lab work & pertinent test results  History of Anesthesia Complications Negative for: history of anesthetic complications  Airway Mallampati: III  TM Distance: >3 FB Neck ROM: Full    Dental no notable dental hx.    Pulmonary asthma (very mild, does not use inhalers, never hospitalized) , sleep apnea , Current Smoker (occasional cigar smoker),    Pulmonary exam normal        Cardiovascular Normal cardiovascular exam  Bifascicular block   Neuro/Psych negative neurological ROS  negative psych ROS   GI/Hepatic Neg liver ROS, GERD  ,  Endo/Other  negative endocrine ROS  Renal/GU negative Renal ROS  negative genitourinary   Musculoskeletal negative musculoskeletal ROS (+)   Abdominal   Peds  Hematology negative hematology ROS (+)   Anesthesia Other Findings   Reproductive/Obstetrics                             Anesthesia Physical Anesthesia Plan  ASA: II  Anesthesia Plan: General   Post-op Pain Management:    Induction: Intravenous  PONV Risk Score and Plan: 1  Airway Management Planned: Oral ETT  Additional Equipment: None  Intra-op Plan:   Post-operative Plan: Extubation in OR  Informed Consent: I have reviewed the patients History and Physical, chart, labs and discussed the procedure including the risks, benefits and alternatives for the proposed anesthesia with the patient or authorized representative who has indicated his/her understanding and acceptance.     Plan Discussed with:   Anesthesia Plan Comments:         Anesthesia Quick Evaluation

## 2018-04-26 NOTE — Anesthesia Postprocedure Evaluation (Signed)
Anesthesia Post Note  Patient: EMBRY HUSS  Procedure(s) Performed: POSTERIOR LUMBAR INTERBODY FUSION, INTERBODY PROSTHESIS, POSTERIOR INSTRUMENTATION LUMBAR THREE- LUMBAR FOUR; EXPLORE OLD FUSION (N/A Spine Lumbar)     Patient location during evaluation: PACU Anesthesia Type: General Level of consciousness: awake and alert Pain management: pain level controlled Vital Signs Assessment: post-procedure vital signs reviewed and stable Respiratory status: spontaneous breathing, nonlabored ventilation and respiratory function stable Cardiovascular status: blood pressure returned to baseline and stable Postop Assessment: no apparent nausea or vomiting Anesthetic complications: no    Last Vitals:  Vitals:   04/26/18 1615 04/26/18 1649  BP: 140/68 (!) 149/85  Pulse: 84 90  Resp: 18 18  Temp: (!) 36.1 C 36.5 C  SpO2: 93% 97%    Last Pain:  Vitals:   04/26/18 1649  TempSrc: Oral  PainSc:                  Lidia Collum

## 2018-04-26 NOTE — Transfer of Care (Signed)
Immediate Anesthesia Transfer of Care Note  Patient: Patrick Mathews  Procedure(s) Performed: POSTERIOR LUMBAR INTERBODY FUSION, INTERBODY PROSTHESIS, POSTERIOR INSTRUMENTATION LUMBAR THREE- LUMBAR FOUR; EXPLORE OLD FUSION (N/A Spine Lumbar)  Patient Location: PACU  Anesthesia Type:General  Level of Consciousness: awake, alert  and oriented  Airway & Oxygen Therapy: Patient Spontanous Breathing and Patient connected to nasal cannula oxygen  Post-op Assessment: Report given to RN, Post -op Vital signs reviewed and stable and Patient moving all extremities X 4  Post vital signs: Reviewed and stable  Last Vitals:  Vitals Value Taken Time  BP 141/70 04/26/2018  3:15 PM  Temp    Pulse 89 04/26/2018  3:18 PM  Resp 17 04/26/2018  3:18 PM  SpO2 97 % 04/26/2018  3:18 PM  Vitals shown include unvalidated device data.  Last Pain:  Vitals:   04/26/18 0907  TempSrc:   PainSc: 0-No pain         Complications: No apparent anesthesia complications

## 2018-04-27 LAB — BASIC METABOLIC PANEL
Anion gap: 6 (ref 5–15)
BUN: 15 mg/dL (ref 8–23)
CO2: 27 mmol/L (ref 22–32)
Calcium: 8.7 mg/dL — ABNORMAL LOW (ref 8.9–10.3)
Chloride: 104 mmol/L (ref 98–111)
Creatinine, Ser: 1.22 mg/dL (ref 0.61–1.24)
GFR calc Af Amer: >60 mL/min (ref >60)
GFR calc non Af Amer: 60 mL/min — ABNORMAL LOW (ref >60)
Glucose, Bld: 178 mg/dL — ABNORMAL HIGH (ref 70–99)
Potassium: 4 mmol/L (ref 3.5–5.1)
Sodium: 137 mmol/L (ref 135–145)

## 2018-04-27 LAB — CBC
HCT: 39.4 % (ref 39.0–52.0)
Hemoglobin: 13.4 g/dL (ref 13.0–17.0)
MCH: 30.9 pg (ref 26.0–34.0)
MCHC: 34 g/dL (ref 30.0–36.0)
MCV: 91 fL (ref 80.0–100.0)
Platelets: 165 10*3/uL (ref 150–400)
RBC: 4.33 MIL/uL (ref 4.22–5.81)
RDW: 12.3 % (ref 11.5–15.5)
WBC: 11.2 10*3/uL — ABNORMAL HIGH (ref 4.0–10.5)
nRBC: 0 % (ref 0.0–0.2)

## 2018-04-27 MED ORDER — CYCLOBENZAPRINE HCL 10 MG PO TABS: 10.0000 mg | ORAL_TABLET | Freq: Three times a day (TID) | ORAL | 0 refills | Status: DC | PRN

## 2018-04-27 MED ORDER — DOCUSATE SODIUM 100 MG PO CAPS: 100.0000 mg | ORAL_CAPSULE | Freq: Two times a day (BID) | ORAL | 0 refills | Status: DC

## 2018-04-27 MED ORDER — OXYCODONE HCL 5 MG PO TABS: 5.0000 mg | ORAL_TABLET | ORAL | 0 refills | Status: DC | PRN

## 2018-04-27 NOTE — Progress Notes (Signed)
Orthopedic Tech Progress Note Patient Details:  Patrick Mathews 1951/03/19 [id number]  Patient ID: Patrick Mathews, male   DOB: [birth year], 67 y.o.   MRN: [id number] I called rn. She stated pt already has brace.  Karolee Stamps 04/27/2018, 9:20 AM

## 2018-04-27 NOTE — Evaluation (Signed)
Physical Therapy Evaluation and Discharge Patient Details Name: Patrick Mathews MRN: [id number] DOB: 1951-04-29 Today's Date: 04/27/2018   History of Present Illness  Pt is a 67 y/o male now s/p L3-4 decompression, instrumentation and fusion. PMHx includes asthma, previous back surgery, hx of L TKA   Clinical Impression  Patient evaluated by Physical Therapy with no further acute PT needs identified. All education has been completed and the patient has no further questions. At the time of PT eval pt was able to perform transfers and ambulation with gross modified independence to supervision for safety. He was educated on precautions, car transfer, safe activity progression, and stair negotiation. Pt also reports he would like to attend his father-in-law's funeral this weekend. We discussed energy conservation techniques, and use of rollator to have a place to sit down whenever he needs. See below for any follow-up Physical Therapy or equipment needs. PT is signing off. Thank you for this referral.     Follow Up Recommendations No PT follow up;Supervision for mobility/OOB    Equipment Recommendations  None recommended by PT    Recommendations for Other Services       Precautions / Restrictions Precautions Precautions: Back;Fall Precaution Booklet Issued: Yes (comment) Precaution Comments: issued and reviewed with pt/pt's son Required Braces or Orthoses: Spinal Brace Spinal Brace: Lumbar corset;Applied in sitting position Restrictions Weight Bearing Restrictions: No      Mobility  Bed Mobility       General bed mobility comments: Pt was received in hall with OT  Transfers Overall transfer level: Needs assistance Equipment used: None Transfers: Sit to/from Stand Sit to Stand: Modified independent (Device/Increase time)         General transfer comment: Mod I by end of session. No unsteadiness or LOB noted. Pt demonstrated proper hand placement on seated surface for  safety.   Ambulation/Gait Ambulation/Gait assistance: Supervision Gait Distance (Feet): 250 Feet Assistive device: None Gait Pattern/deviations: Step-through pattern;Decreased stride length;Trunk flexed Gait velocity: Decreased Gait velocity interpretation: 1.31 - 2.62 ft/sec, indicative of limited community ambulator General Gait Details: VC's for improved posture. Pt appears slightly unsteady at times but no overt LOB noted and no assist was required.   Stairs Stairs: Yes Stairs assistance: Min guard;Supervision Stair Management: One rail Right;Step to pattern;Forwards Number of Stairs: 10    Wheelchair Mobility    Modified Rankin (Stroke Patients Only)       Balance Overall balance assessment: Mild deficits observed, not formally tested                                           Pertinent Vitals/Pain Pain Assessment: Faces Faces Pain Scale: Hurts a little bit Pain Location: back at incision site Pain Descriptors / Indicators: Sore Pain Intervention(s): Monitored during session    Home Living Family/patient expects to be discharged to:: Private residence Living Arrangements: Spouse/significant other;Children Available Help at Discharge: Family Type of Home: House Home Access: Stairs to enter Entrance Stairs-Rails: None Technical brewer of Steps: 2 Home Layout: Multi-level Home Equipment: Shower seat - built in      Prior Function Level of Independence: Independent               Hand Dominance   Dominant Hand: Right    Extremity/Trunk Assessment   Upper Extremity Assessment Upper Extremity Assessment: Overall WFL for tasks assessed    Lower Extremity Assessment Lower  Extremity Assessment: Generalized weakness(Consistent with pre-op diagnosis)    Cervical / Trunk Assessment Cervical / Trunk Assessment: Other exceptions Cervical / Trunk Exceptions: s/p spinal sx  Communication   Communication: No difficulties   Cognition Arousal/Alertness: Awake/alert Behavior During Therapy: WFL for tasks assessed/performed Overall Cognitive Status: Within Functional Limits for tasks assessed                                        General Comments      Exercises     Assessment/Plan    PT Assessment Patent does not need any further PT services  PT Problem List Decreased strength;Decreased range of motion;Decreased activity tolerance;Decreased balance;Decreased mobility;Decreased knowledge of use of DME;Decreased safety awareness;Decreased knowledge of precautions;Pain       PT Treatment Interventions      PT Goals (Current goals can be found in the Care Plan section)  Acute Rehab PT Goals Patient Stated Goal: ready for home today PT Goal Formulation: All assessment and education complete, DC therapy    Frequency     Barriers to discharge        Co-evaluation               AM-PAC PT "6 Clicks" Daily Activity  Outcome Measure Difficulty turning over in bed (including adjusting bedclothes, sheets and blankets)?: None Difficulty moving from lying on back to sitting on the side of the bed? : A Little Difficulty sitting down on and standing up from a chair with arms (e.g., wheelchair, bedside commode, etc,.)?: A Little Help needed moving to and from a bed to chair (including a wheelchair)?: A Little Help needed walking in hospital room?: A Little Help needed climbing 3-5 steps with a railing? : A Little 6 Click Score: 19    End of Session Equipment Utilized During Treatment: Gait belt;Back brace Activity Tolerance: Patient tolerated treatment well Patient left: with call bell/phone within reach;with family/visitor present(Sitting EOB) Nurse Communication: Mobility status PT Visit Diagnosis: Pain Pain - part of body: (back)    Time: 0981-1914 PT Time Calculation (min) (ACUTE ONLY): 14 min   Charges:   PT Evaluation $PT Eval Moderate Complexity: 1 Mod           Rolinda Roan, PT, DPT Acute Rehabilitation Services Pager: [phone] Office: [phone]   Thelma Comp 04/27/2018, 9:40 AM

## 2018-04-27 NOTE — Discharge Summary (Signed)
Physician Discharge Summary  Patient ID: Patrick Mathews MRN: [id number] DOB/AGE: 12-Dec-1950 67 y.o.  Admit date: 04/26/2018 Discharge date: 04/27/2018  Admission Diagnoses: L3-4 spinal stenosis, lumbago, lumbar radiculopathy, neurogenic claudication  Discharge Diagnoses: The same Active Problems:   Spinal stenosis of lumbar region with neurogenic claudication   Discharged Condition: good  Hospital Course: I performed an expiration of patient's lumbar fusion with an L3-4 decompression, instrumentation and fusion on patient on 04/26/2018.  The surgery went well.  The patient's postoperative course was unremarkable.  On postoperative day #1 the patient requested discharge home.  He has little back pain and no leg pain.  Early in the hallway well.  He is urinating well.  The patient, and his son, were given oral and written discharge instructions.  All their questions were answered.  Consults: Physical therapy, occupational therapy Significant Diagnostic Studies: None Treatments: Exploration of lumbar fusion, L3-4 decompression, his rotation and fusion. Discharge Exam: Blood pressure (!) 145/87, pulse 66, temperature (!) 97.5 F (36.4 C), temperature source Oral, resp. rate (!) 2, height 5\' 11"  (1.803 m), weight 100.7 kg, SpO2 100 %. The patient is alert and pleasant.  He looks well.  His strength is normal.  Disposition: Home  Discharge Instructions    Call MD for:  difficulty breathing, headache or visual disturbances   Complete by:  As directed    Call MD for:  extreme fatigue   Complete by:  As directed    Call MD for:  hives   Complete by:  As directed    Call MD for:  persistant dizziness or light-headedness   Complete by:  As directed    Call MD for:  persistant nausea and vomiting   Complete by:  As directed    Call MD for:  redness, tenderness, or signs of infection (pain, swelling, redness, odor or green/yellow discharge around incision site)   Complete by:  As  directed    Call MD for:  severe uncontrolled pain   Complete by:  As directed    Call MD for:  temperature >100.4   Complete by:  As directed    Diet - low sodium heart healthy   Complete by:  As directed    Discharge instructions   Complete by:  As directed    Call [phone] for a followup appointment. Take a stool softener while you are using pain medications.   Driving Restrictions   Complete by:  As directed    Do not drive for 2 weeks.   Increase activity slowly   Complete by:  As directed    Lifting restrictions   Complete by:  As directed    Do not lift more than 5 pounds. No excessive bending or twisting.   May shower / Bathe   Complete by:  As directed    Remove the dressing for 3 days after surgery.  You may shower, but leave the incision alone.   Remove dressing in 48 hours   Complete by:  As directed    Your stitches are under the scan and will dissolve by themselves. The Steri-Strips will fall off after you take a few showers. Do not rub back or pick at the wound, Leave the wound alone.     Allergies as of 04/27/2018      Reactions   Morphine And Related Nausea And Vomiting   Sulfa Antibiotics Hives      Medication List    STOP taking these medications   acetaminophen 500  MG tablet Commonly known as:  TYLENOL   ibuprofen 200 MG tablet Commonly known as:  ADVIL,MOTRIN     TAKE these medications   cyclobenzaprine 10 MG tablet Commonly known as:  FLEXERIL Take 1 tablet (10 mg total) by mouth 3 (three) times daily as needed for muscle spasms. What changed:    when to take this  reasons to take this   docusate sodium 100 MG capsule Commonly known as:  COLACE Take 1 capsule (100 mg total) by mouth 2 (two) times daily.   gabapentin 600 MG tablet Commonly known as:  NEURONTIN Take 600 mg by mouth at bedtime.   omeprazole 20 MG capsule Commonly known as:  PRILOSEC Take 20 mg by mouth daily before breakfast.   oxyCODONE 5 MG immediate release  tablet Commonly known as:  Oxy IR/ROXICODONE Take 1 tablet (5 mg total) by mouth every 4 (four) hours as needed for moderate pain ((score 4 to 6)).   sildenafil 20 MG tablet Commonly known as:  REVATIO Take 40-100 mg by mouth daily as needed (erectile dysfunction).   Vitamin D3 125 MCG (5000 UT) Tbdp Take 5,000 Units by mouth daily.        Signed: Ophelia Charter 04/27/2018, 7:46 AM

## 2018-04-27 NOTE — Evaluation (Signed)
Occupational Therapy Evaluation Patient Details Name: Patrick Mathews MRN: [id number] DOB: 1951/02/03 Today's Date: 04/27/2018    History of Present Illness Pt is a 67 y/o male now s/p L3-4 decompression, instrumentation and fusion. PMHx includes asthma, previous back surgery, hx of L TKA    Clinical Impression   This 67 y/o male presents with the above. At baseline pt reports independence with ADLs and functional mobility. Pt performing functional mobility, shower transfer without AD and overall minguard assist. He currently requires minA for LB ADL due to difficulty accessing LLE, light minA for brace management during UB ADL. Educated both pt and pt's son re: back precautions, brace management, AE, safety and compensatory techniques for performing ADLs and functional mobility while maintaining precautions. Pt requiring min cues to maintain precautions during this session. Pt will return home with spouse and family who he reports are able to assist with ADLs PRN. Questions answered throughout session with no further OT needs identified at this time. Acute OT to sign off, thank you for this referral.     Follow Up Recommendations  No OT follow up;Supervision/Assistance - 24 hour(24hr initially)    Equipment Recommendations  None recommended by OT           Precautions / Restrictions Precautions Precautions: Back;Fall Precaution Booklet Issued: Yes (comment) Precaution Comments: issued and reviewed with pt/pt's son Required Braces or Orthoses: Spinal Brace Spinal Brace: Lumbar corset;Applied in sitting position Restrictions Weight Bearing Restrictions: No      Mobility Bed Mobility Overal bed mobility: Needs Assistance Bed Mobility: Rolling;Sidelying to Sit Rolling: Supervision Sidelying to sit: Min guard       General bed mobility comments: verbal cues for technique to properly use log roll, no physical assist required  Transfers Overall transfer level: Needs  assistance Equipment used: None Transfers: Sit to/from Stand Sit to Stand: Supervision         General transfer comment: for general safety and standing balance, no physical assist required. Pt performed mulitple sit<>stands during session to complete LB dressing and mobility     Balance Overall balance assessment: Mild deficits observed, not formally tested                                         ADL either performed or assessed with clinical judgement   ADL Overall ADL's : Needs assistance/impaired Eating/Feeding: Modified independent;Sitting   Grooming: Supervision/safety;Standing   Upper Body Bathing: Supervision/ safety;Sitting   Lower Body Bathing: With adaptive equipment;Sit to/from stand;Min guard Lower Body Bathing Details (indicate cue type and reason): minguard with use of AE, pt reports he has LH sponge for bathing task, educated on use for increased adherence to back precautions;  Upper Body Dressing : Minimal assistance;Sitting Upper Body Dressing Details (indicate cue type and reason): light minA to position lumbar brace, verbal cues for brace management  Lower Body Dressing: Minimal assistance;Sit to/from stand;With adaptive equipment Lower Body Dressing Details (indicate cue type and reason): minA as pt with difficulty accessing LLE. Educated on use of reacher and sock aide to increase independence with task, pt verbalizing and return demonstrating understanding; reports spouse/family can also assist. Son present this session and assisting PRN; minguard standing balance Toilet Transfer: Min guard;Ambulation;Comfort height toilet Toilet Transfer Details (indicate cue type and reason): simulated in transfer to/from EOB Toileting- Clothing Manipulation and Hygiene: Min guard;Sit to/from stand   Tub/ Banker: Walk-in  shower;Min guard;Ambulation Tub/Shower Transfer Details (indicate cue type and reason): min cues to maintain back precautions and  for technique, no physical assist needed Functional mobility during ADLs: Min guard General ADL Comments: educated both pt/pt's son re: back precautions, AE, safety and compensatory techniques for performing ADLs while maintaining precautions. Pt requires min cues during session to maintain      Vision         Perception     Praxis      Pertinent Vitals/Pain Pain Assessment: Faces Faces Pain Scale: Hurts a little bit Pain Location: back at incision site Pain Descriptors / Indicators: Sore Pain Intervention(s): Monitored during session;Repositioned     Hand Dominance     Extremity/Trunk Assessment Upper Extremity Assessment Upper Extremity Assessment: Overall WFL for tasks assessed   Lower Extremity Assessment Lower Extremity Assessment: Defer to PT evaluation   Cervical / Trunk Assessment Cervical / Trunk Assessment: Other exceptions Cervical / Trunk Exceptions: s/p spinal sx   Communication Communication Communication: No difficulties   Cognition Arousal/Alertness: Awake/alert Behavior During Therapy: WFL for tasks assessed/performed Overall Cognitive Status: Within Functional Limits for tasks assessed                                     General Comments       Exercises     Shoulder Instructions      Home Living Family/patient expects to be discharged to:: Private residence Living Arrangements: Spouse/significant other;Children Available Help at Discharge: Family Type of Home: House Home Access: Stairs to enter Technical brewer of Steps: 2 Entrance Stairs-Rails: None Home Layout: Multi-level Alternate Level Stairs-Number of Steps: 5 stairs up to bedroom, 10 steps down to den Alternate Level Stairs-Rails: Left;Right(sing rail for both staircases) Bathroom Shower/Tub: Occupational psychologist: Handicapped height     Home Equipment: Civil engineer, contracting - built in          Prior Functioning/Environment Level of Independence:  Independent                 OT Problem List: Decreased range of motion;Decreased knowledge of precautions      OT Treatment/Interventions:      OT Goals(Current goals can be found in the care plan section) Acute Rehab OT Goals Patient Stated Goal: ready for home today OT Goal Formulation: All assessment and education complete, DC therapy  OT Frequency:     Barriers to D/C:            Co-evaluation              AM-PAC PT "6 Clicks" Daily Activity     Outcome Measure Help from another person eating meals?: None Help from another person taking care of personal grooming?: None Help from another person toileting, which includes using toliet, bedpan, or urinal?: None Help from another person bathing (including washing, rinsing, drying)?: A Little Help from another person to put on and taking off regular upper body clothing?: A Little Help from another person to put on and taking off regular lower body clothing?: A Little 6 Click Score: 21   End of Session Equipment Utilized During Treatment: Gait belt;Back brace Nurse Communication: Mobility status  Activity Tolerance: Patient tolerated treatment well Patient left: Other (comment)(handoff to PT to begin tx session)  OT Visit Diagnosis: Other abnormalities of gait and mobility (R26.89)  Time: 1610-9604 OT Time Calculation (min): 30 min Charges:  OT General Charges $OT Visit: 1 Visit OT Evaluation $OT Eval Low Complexity: 1 Low OT Treatments $Self Care/Home Management : 8-22 mins  Lou Cal, OT [facility] Pager [phone] Office [phone]   Raymondo Band 04/27/2018, 9:24 AM

## 2018-05-01 MED FILL — Sodium Chloride IV Soln 0.9%: INTRAVENOUS | Qty: 1000 | Status: AC

## 2018-05-01 MED FILL — Heparin Sodium (Porcine) Inj 1000 Unit/ML: INTRAMUSCULAR | Qty: 30 | Status: AC

## 2018-05-22 DIAGNOSIS — M4800 Spinal stenosis, site unspecified: Secondary | ICD-10-CM | POA: Diagnosis not present

## 2018-06-08 DIAGNOSIS — Z125 Encounter for screening for malignant neoplasm of prostate: Secondary | ICD-10-CM | POA: Diagnosis not present

## 2018-06-08 DIAGNOSIS — Z0001 Encounter for general adult medical examination with abnormal findings: Secondary | ICD-10-CM | POA: Diagnosis not present

## 2018-06-08 DIAGNOSIS — Z23 Encounter for immunization: Secondary | ICD-10-CM | POA: Diagnosis not present

## 2018-06-08 DIAGNOSIS — G4739 Other sleep apnea: Secondary | ICD-10-CM | POA: Diagnosis not present

## 2018-06-08 DIAGNOSIS — E782 Mixed hyperlipidemia: Secondary | ICD-10-CM | POA: Diagnosis not present

## 2018-06-08 DIAGNOSIS — M5127 Other intervertebral disc displacement, lumbosacral region: Secondary | ICD-10-CM | POA: Diagnosis not present

## 2018-06-08 DIAGNOSIS — E785 Hyperlipidemia, unspecified: Secondary | ICD-10-CM | POA: Diagnosis not present

## 2018-06-08 DIAGNOSIS — Z1389 Encounter for screening for other disorder: Secondary | ICD-10-CM | POA: Diagnosis not present

## 2018-06-08 DIAGNOSIS — Z6834 Body mass index (BMI) 34.0-34.9, adult: Secondary | ICD-10-CM | POA: Diagnosis not present

## 2018-06-08 DIAGNOSIS — E6609 Other obesity due to excess calories: Secondary | ICD-10-CM | POA: Diagnosis not present

## 2018-06-08 DIAGNOSIS — M1991 Primary osteoarthritis, unspecified site: Secondary | ICD-10-CM | POA: Diagnosis not present

## 2018-06-27 DIAGNOSIS — L739 Follicular disorder, unspecified: Secondary | ICD-10-CM | POA: Diagnosis not present

## 2018-06-27 DIAGNOSIS — Z08 Encounter for follow-up examination after completed treatment for malignant neoplasm: Secondary | ICD-10-CM | POA: Diagnosis not present

## 2018-06-27 DIAGNOSIS — Z85828 Personal history of other malignant neoplasm of skin: Secondary | ICD-10-CM | POA: Diagnosis not present

## 2018-06-27 DIAGNOSIS — D485 Neoplasm of uncertain behavior of skin: Secondary | ICD-10-CM | POA: Diagnosis not present

## 2018-07-11 DIAGNOSIS — D485 Neoplasm of uncertain behavior of skin: Secondary | ICD-10-CM | POA: Diagnosis not present

## 2018-07-11 DIAGNOSIS — L739 Follicular disorder, unspecified: Secondary | ICD-10-CM | POA: Diagnosis not present

## 2018-07-11 DIAGNOSIS — L218 Other seborrheic dermatitis: Secondary | ICD-10-CM | POA: Diagnosis not present

## 2018-09-17 DIAGNOSIS — M4317 Spondylolisthesis, lumbosacral region: Secondary | ICD-10-CM | POA: Diagnosis not present

## 2018-09-18 DIAGNOSIS — M4800 Spinal stenosis, site unspecified: Secondary | ICD-10-CM | POA: Diagnosis not present

## 2018-10-16 DIAGNOSIS — E6609 Other obesity due to excess calories: Secondary | ICD-10-CM | POA: Diagnosis not present

## 2018-10-16 DIAGNOSIS — Z6833 Body mass index (BMI) 33.0-33.9, adult: Secondary | ICD-10-CM | POA: Diagnosis not present

## 2018-10-16 DIAGNOSIS — M5416 Radiculopathy, lumbar region: Secondary | ICD-10-CM | POA: Diagnosis not present

## 2018-10-16 DIAGNOSIS — Z1389 Encounter for screening for other disorder: Secondary | ICD-10-CM | POA: Diagnosis not present

## 2018-10-16 DIAGNOSIS — M5127 Other intervertebral disc displacement, lumbosacral region: Secondary | ICD-10-CM | POA: Diagnosis not present

## 2018-12-27 DIAGNOSIS — H66002 Acute suppurative otitis media without spontaneous rupture of ear drum, left ear: Secondary | ICD-10-CM | POA: Diagnosis not present

## 2019-01-16 DIAGNOSIS — H903 Sensorineural hearing loss, bilateral: Secondary | ICD-10-CM | POA: Diagnosis not present

## 2019-01-16 DIAGNOSIS — H6122 Impacted cerumen, left ear: Secondary | ICD-10-CM | POA: Diagnosis not present

## 2019-01-16 DIAGNOSIS — H838X3 Other specified diseases of inner ear, bilateral: Secondary | ICD-10-CM | POA: Diagnosis not present

## 2019-01-28 ENCOUNTER — Other Ambulatory Visit: Payer: Self-pay | Admitting: Orthopedic Surgery

## 2019-01-28 DIAGNOSIS — S46211A Strain of muscle, fascia and tendon of other parts of biceps, right arm, initial encounter: Secondary | ICD-10-CM

## 2019-01-28 DIAGNOSIS — M25521 Pain in right elbow: Secondary | ICD-10-CM | POA: Diagnosis not present

## 2019-01-29 DIAGNOSIS — Z6831 Body mass index (BMI) 31.0-31.9, adult: Secondary | ICD-10-CM | POA: Diagnosis not present

## 2019-01-29 DIAGNOSIS — I10 Essential (primary) hypertension: Secondary | ICD-10-CM | POA: Diagnosis not present

## 2019-01-29 DIAGNOSIS — M5416 Radiculopathy, lumbar region: Secondary | ICD-10-CM | POA: Diagnosis not present

## 2019-02-12 DIAGNOSIS — D225 Melanocytic nevi of trunk: Secondary | ICD-10-CM | POA: Diagnosis not present

## 2019-02-12 DIAGNOSIS — L400 Psoriasis vulgaris: Secondary | ICD-10-CM | POA: Diagnosis not present

## 2019-02-12 DIAGNOSIS — D485 Neoplasm of uncertain behavior of skin: Secondary | ICD-10-CM | POA: Diagnosis not present

## 2019-02-12 DIAGNOSIS — L820 Inflamed seborrheic keratosis: Secondary | ICD-10-CM | POA: Diagnosis not present

## 2019-03-01 ENCOUNTER — Ambulatory Visit
Admission: RE | Admit: 2019-03-01 | Discharge: 2019-03-01 | Disposition: A | Discharge status: Home / Self Care | Payer: Medicare Other | Source: Ambulatory Visit | Attending: Orthopedic Surgery | Admitting: Orthopedic Surgery

## 2019-03-01 DIAGNOSIS — S46211A Strain of muscle, fascia and tendon of other parts of biceps, right arm, initial encounter: Secondary | ICD-10-CM

## 2019-03-01 DIAGNOSIS — M19021 Primary osteoarthritis, right elbow: Secondary | ICD-10-CM | POA: Diagnosis not present

## 2019-03-11 DIAGNOSIS — S56811D Strain of other muscles, fascia and tendons at forearm level, right arm, subsequent encounter: Secondary | ICD-10-CM | POA: Diagnosis not present

## 2019-03-11 DIAGNOSIS — M25521 Pain in right elbow: Secondary | ICD-10-CM | POA: Diagnosis not present

## 2019-05-23 ENCOUNTER — Other Ambulatory Visit: Payer: Self-pay

## 2019-05-23 DIAGNOSIS — Z20828 Contact with and (suspected) exposure to other viral communicable diseases: Secondary | ICD-10-CM | POA: Diagnosis not present

## 2019-05-23 DIAGNOSIS — Z20822 Contact with and (suspected) exposure to covid-19: Secondary | ICD-10-CM

## 2019-05-24 ENCOUNTER — Other Ambulatory Visit: Payer: Self-pay

## 2019-05-24 ENCOUNTER — Other Ambulatory Visit (HOSPITAL_COMMUNITY): Payer: Self-pay | Admitting: Family Medicine

## 2019-05-24 ENCOUNTER — Ambulatory Visit (HOSPITAL_COMMUNITY)
Admission: RE | Admit: 2019-05-24 | Discharge: 2019-05-24 | Disposition: A | Discharge status: Home / Self Care | Payer: Medicare Other | Source: Ambulatory Visit | Attending: Family Medicine | Admitting: Family Medicine

## 2019-05-24 DIAGNOSIS — R0789 Other chest pain: Secondary | ICD-10-CM | POA: Insufficient documentation

## 2019-05-24 DIAGNOSIS — E6609 Other obesity due to excess calories: Secondary | ICD-10-CM | POA: Diagnosis not present

## 2019-05-24 DIAGNOSIS — E7849 Other hyperlipidemia: Secondary | ICD-10-CM | POA: Diagnosis not present

## 2019-05-24 DIAGNOSIS — M940 Chondrocostal junction syndrome [Tietze]: Secondary | ICD-10-CM | POA: Diagnosis not present

## 2019-05-24 DIAGNOSIS — R079 Chest pain, unspecified: Secondary | ICD-10-CM | POA: Diagnosis not present

## 2019-05-24 DIAGNOSIS — Z6833 Body mass index (BMI) 33.0-33.9, adult: Secondary | ICD-10-CM | POA: Diagnosis not present

## 2019-05-24 LAB — NOVEL CORONAVIRUS, NAA: SARS-CoV-2, NAA: NOT DETECTED

## 2019-05-27 ENCOUNTER — Telehealth: Payer: Self-pay | Admitting: General Practice

## 2019-05-27 NOTE — Telephone Encounter (Signed)
Negative COVID results given. Patient results "NOT Detected." Caller expressed understanding. ° °

## 2019-06-12 DIAGNOSIS — E7849 Other hyperlipidemia: Secondary | ICD-10-CM | POA: Diagnosis not present

## 2019-06-12 DIAGNOSIS — Z0000 Encounter for general adult medical examination without abnormal findings: Secondary | ICD-10-CM | POA: Diagnosis not present

## 2019-06-12 DIAGNOSIS — N529 Male erectile dysfunction, unspecified: Secondary | ICD-10-CM | POA: Diagnosis not present

## 2019-06-12 DIAGNOSIS — Z6833 Body mass index (BMI) 33.0-33.9, adult: Secondary | ICD-10-CM | POA: Diagnosis not present

## 2019-06-13 DIAGNOSIS — E7849 Other hyperlipidemia: Secondary | ICD-10-CM | POA: Diagnosis not present

## 2019-06-13 DIAGNOSIS — M5127 Other intervertebral disc displacement, lumbosacral region: Secondary | ICD-10-CM | POA: Diagnosis not present

## 2019-07-11 ENCOUNTER — Ambulatory Visit (INDEPENDENT_AMBULATORY_CARE_PROVIDER_SITE_OTHER): Payer: Medicare Other | Admitting: Cardiovascular Disease

## 2019-07-11 ENCOUNTER — Encounter: Payer: Self-pay | Admitting: Nurse Practitioner

## 2019-07-11 ENCOUNTER — Other Ambulatory Visit: Payer: Self-pay

## 2019-07-11 ENCOUNTER — Encounter: Payer: Self-pay | Admitting: Cardiovascular Disease

## 2019-07-11 VITALS — BP 146/88 | HR 59 | Ht 70.0 in | Wt 229.8 lb

## 2019-07-11 DIAGNOSIS — E785 Hyperlipidemia, unspecified: Secondary | ICD-10-CM | POA: Diagnosis not present

## 2019-07-11 DIAGNOSIS — I4510 Unspecified right bundle-branch block: Secondary | ICD-10-CM

## 2019-07-11 DIAGNOSIS — R0789 Other chest pain: Secondary | ICD-10-CM

## 2019-07-11 MED ORDER — ASPIRIN EC 81 MG PO TBEC: 81.0000 mg | DELAYED_RELEASE_TABLET | Freq: Every day | ORAL | Status: DC

## 2019-07-11 MED ORDER — NITROGLYCERIN 0.4 MG SL SUBL: 0.4000 mg | SUBLINGUAL_TABLET | SUBLINGUAL | 6 refills | Status: DC | PRN

## 2019-07-11 NOTE — Progress Notes (Signed)
Cardiology Office Note:    Date:  07/11/2019   ID:  SILVER WISNER, DOB [birth year], MRN [id number]  PCP:  Sharilyn Sites, MD  Cardiologist:  No primary care provider on file.  Electrophysiologist:  None   Referring MD: Sharilyn Sites, MD   Chief Complaint  Patient presents with  . Chest Pain    History of Present Illness:    Patrick Mathews is a 69 y.o. male with a hx of  CP .    We were asked to see him today for this CP by Dr. Hilma Favors .    Hx of HLD.   CP / pressure for the past 6 months.  Active,  Golf, fishes, works out in the yard.  Walks 1-2 times a week,  Perhaps a mild  Has CP - frequently while in the recliner in the evenings.  Perhaps several hours after he has eaten dinner. Thinks the episodes may be slightly worsening recently. He describes the pain as a pressure-like sensation.  The discomfort is midsternal.  It does not radiate to his shoulders or neck or arm.  It is associated with some increased shortness of breath.  No diaphoresis no presyncope or syncope. Occurs serval times a week.  May last 3-4 minutes , then eases off   Just started rosuvastatin   Past Medical History:  Diagnosis Date  . Adjacent segment disease with spinal stenosis   . Arthritis    "knees; back" (07/14/2014)  . Asthma   . CAD (coronary artery disease)   . Chest pain   . Exertional dyspnea   . GERD (gastroesophageal reflux disease)   . Hyperlipidemia   . Hypertension   . OSA on CPAP    no cpap  . Pneumonia 11/2013  . Primary localized osteoarthritis of left knee   . Seasonal allergies     Past Surgical History:  Procedure Laterality Date  . ANTERIOR CERVICAL DECOMP/DISCECTOMY FUSION  2009   Dr Joya Salm  . BACK SURGERY    . BUNIONECTOMY Right  2013  . COLONOSCOPY N/A 11/06/2012   Procedure: COLONOSCOPY;  Surgeon: Jamesetta So, MD;  Location[facility];  Service: Gastroenterology;  Laterality: N/A;  . JOINT REPLACEMENT    . KNEE ARTHROSCOPY Bilateral 1990's  . [place]; 2009   Dr Joya Salm  . TONSILLECTOMY  1950's  . TOTAL KNEE ARTHROPLASTY Left 07/14/2014  . TOTAL KNEE ARTHROPLASTY Left 07/14/2014   Procedure: LEFT TOTAL KNEE ARTHROPLASTY;  Surgeon: [name], MD;  Location: [facility];  Service: Orthopedics;  Laterality: Left;    Current Medications: Current Meds  Medication Sig  . Cholecalciferol (VITAMIN D3) 125 MCG (5000 UT) TBDP Take 5,000 Units by mouth daily.  [name] gabapentin (NEURONTIN) 300 MG capsule Take 300 mg by mouth 3 (three) times daily. 300mg  in the morning and 600mg  at night  . gabapentin (NEURONTIN) 600 MG tablet Take 600 mg by mouth at bedtime.  . Multiple Vitamin (MULTIVITAMIN ADULT PO) Take 1 tablet by mouth. 1-2 tablets a day  . omeprazole (PRILOSEC) 20 MG capsule Take 20 mg by mouth daily before breakfast.  . rosuvastatin (CRESTOR) 10 MG tablet Take 10 mg by mouth daily.  . sildenafil (REVATIO) 20 MG tablet Take 40-100 mg by mouth daily as needed (erectile dysfunction).  . tadalafil (CIALIS) 5 MG tablet Take 5 mg by mouth as needed.  . [DISCONTINUED] docusate sodium (COLACE) 100 MG capsule Take 1 capsule (100 mg total) by mouth 2 (  two) times daily.     Allergies:   Morphine and related and Sulfa antibiotics   Social History   Socioeconomic History  . Marital status: Married    Spouse name: Not on file  . Number of children: Not on file  . Years of education: Not on file  . Highest education level: Not on file  Occupational History  . Not on file  Tobacco Use  . Smoking status: Current Some Day Smoker    Years: 30.00    Types: Cigars  . Smokeless tobacco: Never Used  . Tobacco comment: 07/14/2014 smoked cigs on and off x 40 yrs- none since 2014, but does smoke occ cigar-  Substance and Sexual Activity  . Alcohol use: Yes    Alcohol/week: 0.0 standard drinks    Comment: occ  . Drug use: No  . Sexual activity: Yes  Other Topics Concern  . Not on file  Social History Narrative  . Not on file   Social  Determinants of Health   Financial Resource Strain:   . Difficulty of Paying Living Expenses: Not on file  Food Insecurity:   . Worried About Charity fundraiser in the Last Year: Not on file  . Ran Out of Food in the Last Year: Not on file  Transportation Needs:   . Lack of Transportation (Medical): Not on file  . Lack of Transportation (Non-Medical): Not on file  Physical Activity:   . Days of Exercise per Week: Not on file  . Minutes of Exercise per Session: Not on file  Stress:   . Feeling of Stress : Not on file  Social Connections:   . Frequency of Communication with Friends and Family: Not on file  . Frequency of Social Gatherings with Friends and Family: Not on file  . Attends Religious Services: Not on file  . Active Member of Clubs or Organizations: Not on file  . Attends Archivist Meetings: Not on file  . Marital Status: Not on file     Family History: The patient's family history includes Alzheimer's disease in his father and mother; CAD in his maternal grandfather and mother; CVA in his mother; Dementia in his father; Heart attack in his maternal grandfather; Stroke in his maternal grandmother and paternal grandmother. There is no history of Colon cancer or Colon polyps.  ROS:   Please see the history of present illness.     All other systems reviewed and are negative.  EKGs/Labs/Other Studies Reviewed:    The following studies were reviewed today:    Recent Labs: No results found for requested labs within last 8760 hours.  Recent Lipid Panel No results found for: CHOL, TRIG, HDL, CHOLHDL, VLDL, LDLCALC, LDLDIRECT  Physical Exam:    VS:  BP (!) 146/88   Pulse (!) 59   Ht 5\' 10"  (1.778 m)   Wt 229 lb 12.8 oz (104.2 kg)   SpO2 97%   BMI 32.97 kg/m     Wt Readings from Last 3 Encounters:  07/11/19 229 lb 12.8 oz (104.2 kg)  04/26/18 222 lb (100.7 kg)  04/25/18 222 lb (100.7 kg)     GEN:  Well nourished, well developed in no acute  distress HEENT: Normal NECK: No JVD; No carotid bruits LYMPHATICS: No lymphadenopathy CARDIAC: RRR, no murmurs, rubs, gallops RESPIRATORY:  Clear to auscultation without rales, wheezing or rhonchi  ABDOMEN: Soft, non-tender, non-distended MUSCULOSKELETAL:  No edema; No deformity  SKIN: Warm and dry NEUROLOGIC:  Alert and  oriented x 3 PSYCHIATRIC:  Normal affect   EKG: July 11, 2019: Sinus bradycardia at 40-59 beats a minute.  Right bundle branch block.  No acute ST or T wave changes.    ASSESSMENT:    1. Chest pressure   2. Right bundle branch block   3. Hyperlipidemia, unspecified hyperlipidemia type   4. Morbid obesity ([place])    PLAN:    In order of problems listed above:  1. Chest pain :  Has chest pressure - typically in the evening after eating.   Exercises a little.  Episodes are not necessarily related to exercise but he he is not exactly sure what brings these episodes on.  He does have several risk factors for cardiac disease including recent history of smoking and fairly high cholesterol. Will start ASA NTG PRN.  Advised him to hold his cialis for now   Will see him in 2 months - sooner if myoview is abn or if CP worsens.   2.  Hyperlipidemia: Labs from his primary medical doctor reveals a total cholesterol 254.  The HDL is 38.  The LDL is 195.  Triglyceride levels 115.  He was recently started on rosuvastatin 10 mg a day.  Medication Adjustments/Labs and Tests Ordered: Current medicines are reviewed at length with the patient today.  Concerns regarding medicines are outlined above.  Orders Placed This Encounter  Procedures  . MYOCARDIAL PERFUSION IMAGING  . EKG 12-Lead   Meds ordered this encounter  Medications  . nitroGLYCERIN (NITROSTAT) 0.4 MG SL tablet    Sig: Place 1 tablet (0.4 mg total) under the tongue every 5 (five) minutes as needed for chest pain.    Dispense:  25 tablet    Refill:  6  . aspirin EC 81 MG tablet    Sig: Take 1 tablet (81 mg  total) by mouth daily.    Dispense:       Patient Instructions  Medication Instructions:  Your physician has recommended you make the following change in your medication:  START Aspirin 81 mg once daily USE Sublingual Nitroglycerin 0.4 mg under your tongue as needed for chest pain - DO NOT Use Tadalafil or any other medication for ED within 48 hours of taking Nitroglycerin  *If you need a refill on your cardiac medications before your next appointment, please call your pharmacy*  Lab Work: None Ordered If you have labs (blood work) drawn today and your tests are completely normal, you will receive your results only by: [name] MyChart Message (if you have MyChart) OR . A paper copy in the mail If you have any lab test that is abnormal or we need to change your treatment, we will call you to review the results.   Testing/Procedures: Your physician has requested that you have a lexiscan myoview. For further information please visit [email or web address]. Please follow instruction sheet, as given.    Follow-Up: At [facility], you and your health needs are our priority.  As part of our continuing mission to provide you with exceptional heart care, we have created designated Provider Care Teams.  These Care Teams include your primary Cardiologist (physician) and Advanced Practice Providers (APPs -  Physician Assistants and Nurse Practitioners) who all work together to provide you with the care you need, when you need it.  Your next appointment:   2 month(s)  The format for your next appointment:   In Person  Provider:   Mertie Moores, MD  Other Instructions  Mediterranean Diet A Mediterranean  diet refers to food and lifestyle choices that are based on the traditions of countries located on the [employer]. This way of eating has been shown to help prevent certain conditions and improve outcomes for people who have chronic diseases, like kidney disease and heart disease. What are  tips for following this plan? Lifestyle  Cook and eat meals together with your family, when possible.  Drink enough fluid to keep your urine clear or pale yellow.  Be physically active every day. This includes: ? Aerobic exercise like running or swimming. ? Leisure activities like gardening, walking, or housework.  Get 7-8 hours of sleep each night.  If recommended by your health care provider, drink red wine in moderation. This means 1 glass a day for nonpregnant women and 2 glasses a day for men. A glass of wine equals 5 oz (150 mL). Reading food labels   Check the serving size of packaged foods. For foods such as rice and pasta, the serving size refers to the amount of cooked product, not dry.  Check the total fat in packaged foods. Avoid foods that have saturated fat or trans fats.  Check the ingredients list for added sugars, such as corn syrup. Shopping  At the grocery store, buy most of your food from the areas near the walls of the store. This includes: ? Fresh fruits and vegetables (produce). ? Grains, beans, nuts, and seeds. Some of these may be available in unpackaged forms or large amounts (in bulk). ? Fresh seafood. ? Poultry and eggs. ? Low-fat dairy products.  Buy whole ingredients instead of prepackaged foods.  Buy fresh fruits and vegetables in-season from local farmers markets.  Buy frozen fruits and vegetables in resealable bags.  If you do not have access to quality fresh seafood, buy precooked frozen shrimp or canned fish, such as tuna, salmon, or sardines.  Buy small amounts of raw or cooked vegetables, salads, or olives from the deli or salad bar at your store.  Stock your pantry so you always have certain foods on hand, such as olive oil, canned tuna, canned tomatoes, rice, pasta, and beans. Cooking  Cook foods with extra-virgin olive oil instead of using butter or other vegetable oils.  Have meat as a side dish, and have vegetables or grains as  your main dish. This means having meat in small portions or adding small amounts of meat to foods like pasta or stew.  Use beans or vegetables instead of meat in common dishes like chili or lasagna.  Experiment with different cooking methods. Try roasting or broiling vegetables instead of steaming or sauteing them.  Add frozen vegetables to soups, stews, pasta, or rice.  Add nuts or seeds for added healthy fat at each meal. You can add these to yogurt, salads, or vegetable dishes.  Marinate fish or vegetables using olive oil, lemon juice, garlic, and fresh herbs. Meal planning   Plan to eat 1 vegetarian meal one day each week. Try to work up to 2 vegetarian meals, if possible.  Eat seafood 2 or more times a week.  Have healthy snacks readily available, such as: ? Vegetable sticks with hummus. ? [place] yogurt. ? Fruit and nut trail mix.  Eat balanced meals throughout the week. This includes: ? Fruit: 2-3 servings a day ? Vegetables: 4-5 servings a day ? Low-fat dairy: 2 servings a day ? Fish, poultry, or lean meat: 1 serving a day ? Beans and legumes: 2 or more servings a week ? Nuts and  seeds: 1-2 servings a day ? Whole grains: 6-8 servings a day ? Extra-virgin olive oil: 3-4 servings a day  Limit red meat and sweets to only a few servings a month What are my food choices?  Mediterranean diet ? Recommended  Grains: Whole-grain pasta. Brown rice. Bulgar wheat. Polenta. Couscous. Whole-wheat bread. [place].  Vegetables: Artichokes. Beets. Broccoli. Cabbage. Carrots. Eggplant. Green beans. Chard. Kale. Spinach. Onions. Leeks. Peas. Squash. Tomatoes. Peppers. Radishes.  Fruits: Apples. Apricots. Avocado. Berries. Bananas. Cherries. Dates. Figs. Grapes. Lemons. Melon. Oranges. Peaches. Plums. Pomegranate.  Meats and other protein foods: Beans. Almonds. Sunflower seeds. Pine nuts. Peanuts. [place]. Salmon. Scallops. Shrimp. [place]. Tilapia. Clams. Oysters. Eggs.  Dairy:  Low-fat milk. Cheese. Greek yogurt.  Beverages: Water. Red wine. Herbal tea.  Fats and oils: Extra virgin olive oil. Avocado oil. Grape seed oil.  Sweets and desserts: [place] yogurt with honey. Baked apples. Poached pears. Trail mix.  Seasoning and other foods: Basil. Cilantro. Coriander. Cumin. Mint. Parsley. Sage. Rosemary. Tarragon. Garlic. Oregano. Thyme. Pepper. Balsalmic vinegar. Tahini. Hummus. Tomato sauce. Olives. Mushrooms. ? Limit these  Grains: Prepackaged pasta or rice dishes. Prepackaged cereal with added sugar.  Vegetables: Deep fried potatoes (french fries).  Fruits: Fruit canned in syrup.  Meats and other protein foods: Beef. Pork. Lamb. Poultry with skin. Hot dogs. Berniece Salines.  Dairy: Ice cream. Sour cream. Whole milk.  Beverages: Juice. Sugar-sweetened soft drinks. Beer. Liquor and spirits.  Fats and oils: Butter. Canola oil. Vegetable oil. Beef fat (tallow). Lard.  Sweets and desserts: Cookies. Cakes. Pies. Candy.  Seasoning and other foods: Mayonnaise. Premade sauces and marinades. The items listed may not be a complete list. Talk with your dietitian about what dietary choices are right for you. Summary  The Mediterranean diet includes both food and lifestyle choices.  Eat a variety of fresh fruits and vegetables, beans, nuts, seeds, and whole grains.  Limit the amount of red meat and sweets that you eat.  Talk with your health care provider about whether it is safe for you to drink red wine in moderation. This means 1 glass a day for nonpregnant women and 2 glasses a day for men. A glass of wine equals 5 oz (150 mL). This information is not intended to replace advice given to you by your health care provider. Make sure you discuss any questions you have with your health care provider. Document Revised: 01/28/2016 Document Reviewed: 01/21/2016 Elsevier Patient Education  2020 [place], Mertie Moores, MD  07/11/2019 9:30 AM    [place]

## 2019-07-11 NOTE — Patient Instructions (Addendum)
Medication Instructions:  Your physician has recommended you make the following change in your medication:  START Aspirin 81 mg once daily USE Sublingual Nitroglycerin 0.4 mg under your tongue as needed for chest pain - DO NOT Use Tadalafil or any other medication for ED within 48 hours of taking Nitroglycerin  *If you need a refill on your cardiac medications before your next appointment, please call your pharmacy*  Lab Work: None Ordered If you have labs (blood work) drawn today and your tests are completely normal, you will receive your results only by: [name] MyChart Message (if you have MyChart) OR . A paper copy in the mail If you have any lab test that is abnormal or we need to change your treatment, we will call you to review the results.   Testing/Procedures: Your physician has requested that you have a lexiscan myoview. For further information please visit [email or web address]. Please follow instruction sheet, as given.    Follow-Up: At [facility], you and your health needs are our priority.  As part of our continuing mission to provide you with exceptional heart care, we have created designated Provider Care Teams.  These Care Teams include your primary Cardiologist (physician) and Advanced Practice Providers (APPs -  Physician Assistants and Nurse Practitioners) who all work together to provide you with the care you need, when you need it.  Your next appointment:   2 month(s)  The format for your next appointment:   In Person  Provider:   Mertie Moores, MD  Other Instructions  Mediterranean Diet A Mediterranean diet refers to food and lifestyle choices that are based on the traditions of countries located on the [place]. This way of eating has been shown to help prevent certain conditions and improve outcomes for people who have chronic diseases, like kidney disease and heart disease. What are tips for following this plan? Lifestyle  Cook and eat meals  together with your family, when possible.  Drink enough fluid to keep your urine clear or pale yellow.  Be physically active every day. This includes: ? Aerobic exercise like running or swimming. ? Leisure activities like gardening, walking, or housework.  Get 7-8 hours of sleep each night.  If recommended by your health care provider, drink red wine in moderation. This means 1 glass a day for nonpregnant women and 2 glasses a day for men. A glass of wine equals 5 oz (150 mL). Reading food labels   Check the serving size of packaged foods. For foods such as rice and pasta, the serving size refers to the amount of cooked product, not dry.  Check the total fat in packaged foods. Avoid foods that have saturated fat or trans fats.  Check the ingredients list for added sugars, such as corn syrup. Shopping  At the grocery store, buy most of your food from the areas near the walls of the store. This includes: ? Fresh fruits and vegetables (produce). ? Grains, beans, nuts, and seeds. Some of these may be available in unpackaged forms or large amounts (in bulk). ? Fresh seafood. ? Poultry and eggs. ? Low-fat dairy products.  Buy whole ingredients instead of prepackaged foods.  Buy fresh fruits and vegetables in-season from local farmers markets.  Buy frozen fruits and vegetables in resealable bags.  If you do not have access to quality fresh seafood, buy precooked frozen shrimp or canned fish, such as tuna, salmon, or sardines.  Buy small amounts of raw or cooked vegetables, salads, or olives from the  deli or salad bar at your store.  Stock your pantry so you always have certain foods on hand, such as olive oil, canned tuna, canned tomatoes, rice, pasta, and beans. Cooking  Cook foods with extra-virgin olive oil instead of using butter or other vegetable oils.  Have meat as a side dish, and have vegetables or grains as your main dish. This means having meat in small portions or  adding small amounts of meat to foods like pasta or stew.  Use beans or vegetables instead of meat in common dishes like chili or lasagna.  Experiment with different cooking methods. Try roasting or broiling vegetables instead of steaming or sauteing them.  Add frozen vegetables to soups, stews, pasta, or rice.  Add nuts or seeds for added healthy fat at each meal. You can add these to yogurt, salads, or vegetable dishes.  Marinate fish or vegetables using olive oil, lemon juice, garlic, and fresh herbs. Meal planning   Plan to eat 1 vegetarian meal one day each week. Try to work up to 2 vegetarian meals, if possible.  Eat seafood 2 or more times a week.  Have healthy snacks readily available, such as: ? Vegetable sticks with hummus. ? [place] yogurt. ? Fruit and nut trail mix.  Eat balanced meals throughout the week. This includes: ? Fruit: 2-3 servings a day ? Vegetables: 4-5 servings a day ? Low-fat dairy: 2 servings a day ? Fish, poultry, or lean meat: 1 serving a day ? Beans and legumes: 2 or more servings a week ? Nuts and seeds: 1-2 servings a day ? Whole grains: 6-8 servings a day ? Extra-virgin olive oil: 3-4 servings a day  Limit red meat and sweets to only a few servings a month What are my food choices?  Mediterranean diet ? Recommended  Grains: Whole-grain pasta. Brown rice. Bulgar wheat. Polenta. Couscous. Whole-wheat bread. [place].  Vegetables: Artichokes. Beets. Broccoli. Cabbage. Carrots. Eggplant. Green beans. Chard. Kale. Spinach. Onions. Leeks. Peas. Squash. Tomatoes. Peppers. Radishes.  Fruits: Apples. Apricots. Avocado. Berries. Bananas. Cherries. Dates. Figs. Grapes. Lemons. Melon. Oranges. Peaches. Plums. Pomegranate.  Meats and other protein foods: Beans. Almonds. Sunflower seeds. Pine nuts. Peanuts. [place]. Salmon. Scallops. Shrimp. [place]. Tilapia. Clams. Oysters. Eggs.  Dairy: Low-fat milk. Cheese. Greek yogurt.  Beverages: Water. Red  wine. Herbal tea.  Fats and oils: Extra virgin olive oil. Avocado oil. Grape seed oil.  Sweets and desserts: [place] yogurt with honey. Baked apples. Poached pears. Trail mix.  Seasoning and other foods: Basil. Cilantro. Coriander. Cumin. Mint. Parsley. Sage. Rosemary. Tarragon. Garlic. Oregano. Thyme. Pepper. Balsalmic vinegar. Tahini. Hummus. Tomato sauce. Olives. Mushrooms. ? Limit these  Grains: Prepackaged pasta or rice dishes. Prepackaged cereal with added sugar.  Vegetables: Deep fried potatoes (french fries).  Fruits: Fruit canned in syrup.  Meats and other protein foods: Beef. Pork. Lamb. Poultry with skin. Hot dogs. Bernie[place] Salines.  Dairy: Ice cream. Sour cream. Whole milk.  Beverages: Juice. Sugar-sweetened soft drinks. Beer. Liquor and spirits.  Fats and oils: Butter. Canola oil. Vegetable oil. Beef fat (tallow). Lard.  Sweets and desserts: Cookies. Cakes. Pies. Candy.  Seasoning and other foods: Mayonnaise. Premade sauces and marinades. The items listed may not be a complete list. Talk with your dietitian about what dietary choices are right for you. Summary  The Mediterranean diet includes both food and lifestyle choices.  Eat a variety of fresh fruits and vegetables, beans, nuts, seeds, and whole grains.  Limit the amount of red meat and sweets that  you eat.  Talk with your health care provider about whether it is safe for you to drink red wine in moderation. This means 1 glass a day for nonpregnant women and 2 glasses a day for men. A glass of wine equals 5 oz (150 mL). This information is not intended to replace advice given to you by your health care provider. Make sure you discuss any questions you have with your health care provider. Document Revised: 01/28/2016 Document Reviewed: 01/21/2016 Elsevier Patient Education  [place].

## 2019-07-14 DIAGNOSIS — E7849 Other hyperlipidemia: Secondary | ICD-10-CM | POA: Diagnosis not present

## 2019-07-14 DIAGNOSIS — M5127 Other intervertebral disc displacement, lumbosacral region: Secondary | ICD-10-CM | POA: Diagnosis not present

## 2019-07-18 ENCOUNTER — Telehealth (HOSPITAL_COMMUNITY): Payer: Self-pay | Admitting: *Deleted

## 2019-07-18 DIAGNOSIS — H40013 Open angle with borderline findings, low risk, bilateral: Secondary | ICD-10-CM | POA: Diagnosis not present

## 2019-07-18 DIAGNOSIS — H25013 Cortical age-related cataract, bilateral: Secondary | ICD-10-CM | POA: Diagnosis not present

## 2019-07-18 DIAGNOSIS — H524 Presbyopia: Secondary | ICD-10-CM | POA: Diagnosis not present

## 2019-07-18 NOTE — Telephone Encounter (Signed)
Left message on voicemail in reference to upcoming appointment scheduled for 07/22/19. Phone number given for a call back so details instructions can be given.  Patrick Mathews

## 2019-07-19 ENCOUNTER — Telehealth (HOSPITAL_COMMUNITY): Payer: Self-pay | Admitting: *Deleted

## 2019-07-19 NOTE — Telephone Encounter (Signed)
Patient given detailed instructions per Myocardial Perfusion Study Information Sheet for the test on 07/22/19 at 10:00. Patient notified to arrive 15 minutes early and that it is imperative to arrive on time for appointment to keep from having the test rescheduled.  If you need to cancel or reschedule your appointment, please call the office within 24 hours of your appointment. . Patient verbalized understanding.Veronia Beets

## 2019-07-22 ENCOUNTER — Ambulatory Visit (HOSPITAL_COMMUNITY): Payer: Medicare Other | Attending: Cardiovascular Disease

## 2019-07-22 ENCOUNTER — Other Ambulatory Visit: Payer: Self-pay

## 2019-07-22 DIAGNOSIS — R0789 Other chest pain: Secondary | ICD-10-CM

## 2019-07-22 DIAGNOSIS — I4510 Unspecified right bundle-branch block: Secondary | ICD-10-CM | POA: Insufficient documentation

## 2019-07-22 DIAGNOSIS — E785 Hyperlipidemia, unspecified: Secondary | ICD-10-CM | POA: Insufficient documentation

## 2019-07-22 LAB — MYOCARDIAL PERFUSION IMAGING
LV dias vol: 106 mL (ref 62–150)
LV sys vol: 32 mL
Peak HR: 71 {beats}/min
Rest HR: 51 {beats}/min
SDS: 1
SRS: 0
SSS: 1
TID: 1.09

## 2019-07-22 MED ORDER — TECHNETIUM TC 99M TETROFOSMIN IV KIT
32.1000 | PACK | Freq: Once | INTRAVENOUS | Status: AC | PRN
  Administered 2019-07-22: 32.1 via INTRAVENOUS
  Filled 2019-07-22: qty 33

## 2019-07-22 MED ORDER — TECHNETIUM TC 99M TETROFOSMIN IV KIT
10.6000 | PACK | Freq: Once | INTRAVENOUS | Status: AC | PRN
  Administered 2019-07-22: 10.6 via INTRAVENOUS
  Filled 2019-07-22: qty 11

## 2019-07-22 MED ORDER — REGADENOSON 0.4 MG/5ML IV SOLN
0.4000 mg | Freq: Once | INTRAVENOUS | Status: AC
  Administered 2019-07-22: 0.4 mg via INTRAVENOUS

## 2019-08-08 DIAGNOSIS — H6122 Impacted cerumen, left ear: Secondary | ICD-10-CM | POA: Diagnosis not present

## 2019-09-11 DIAGNOSIS — M5127 Other intervertebral disc displacement, lumbosacral region: Secondary | ICD-10-CM | POA: Diagnosis not present

## 2019-09-11 DIAGNOSIS — E7849 Other hyperlipidemia: Secondary | ICD-10-CM | POA: Diagnosis not present

## 2019-09-19 DIAGNOSIS — H40013 Open angle with borderline findings, low risk, bilateral: Secondary | ICD-10-CM | POA: Diagnosis not present

## 2019-09-20 ENCOUNTER — Ambulatory Visit: Payer: Medicare Other | Admitting: Cardiovascular Disease

## 2019-09-27 ENCOUNTER — Ambulatory Visit (INDEPENDENT_AMBULATORY_CARE_PROVIDER_SITE_OTHER): Payer: Medicare Other | Admitting: Cardiovascular Disease

## 2019-09-27 ENCOUNTER — Encounter: Payer: Self-pay | Admitting: Cardiovascular Disease

## 2019-09-27 ENCOUNTER — Other Ambulatory Visit: Payer: Self-pay

## 2019-09-27 VITALS — BP 110/64 | HR 61 | Ht 70.0 in | Wt 225.8 lb

## 2019-09-27 DIAGNOSIS — E782 Mixed hyperlipidemia: Secondary | ICD-10-CM

## 2019-09-27 DIAGNOSIS — R0789 Other chest pain: Secondary | ICD-10-CM

## 2019-09-27 NOTE — Patient Instructions (Signed)
Medication Instructions:  Your physician recommends that you continue on your current medications as directed. Please refer to the Current Medication list given to you today.  *If you need a refill on your cardiac medications before your next appointment, please call your pharmacy*   Lab Work: None ordered If you have labs (blood work) drawn today and your tests are completely normal, you will receive your results only by: [name] MyChart Message (if you have MyChart) OR . A paper copy in the mail If you have any lab test that is abnormal or we need to change your treatment, we will call you to review the results.   Testing/Procedures: None ordered   Follow-Up: At [facility], you and your health needs are our priority.  As part of our continuing mission to provide you with exceptional heart care, we have created designated Provider Care Teams.  These Care Teams include your primary Cardiologist (physician) and Advanced Practice Providers (APPs -  Physician Assistants and Nurse Practitioners) who all work together to provide you with the care you need, when you need it.  We recommend signing up for the patient portal called "MyChart".  Sign up information is provided on this After Visit Summary.  MyChart is used to connect with patients for Virtual Visits (Telemedicine).  Patients are able to view lab/test results, encounter notes, upcoming appointments, etc.  Non-urgent messages can be sent to your provider as well.   To learn more about what you can do with MyChart, go to [email or web address].    Your next appointment:    as needed  The format for your next appointment:   In Person  Provider:   Mertie Moores, MD   Thank you for choosing [facility]!!      Other Instructions

## 2019-09-27 NOTE — Progress Notes (Signed)
Cardiology Office Note:    Date:  09/27/2019   ID:  Patrick Mathews, DOB 10/10/1950, MRN [id number]  PCP:  Sharilyn Sites, MD  Cardiologist:  Nahser  Electrophysiologist:  None   Referring MD: Sharilyn Sites, MD   Chief Complaint  Patient presents with  . Chest Pain    History of Present Illness:    Patrick Mathews is a 69 y.o. male with a hx of  CP .    We were asked to see him today for this CP by Dr. Hilma Favors .    Hx of HLD.   CP / pressure for the past 6 months.  Active,  Golf, fishes, works out in the yard.  Walks 1-2 times a week,  Perhaps a mild  Has CP - frequently while in the recliner in the evenings.  Perhaps several hours after he has eaten dinner. Thinks the episodes may be slightly worsening recently. He describes the pain as a pressure-like sensation.  The discomfort is midsternal.  It does not radiate to his shoulders or neck or arm.  It is associated with some increased shortness of breath.  No diaphoresis no presyncope or syncope. Occurs serval times a week.  May last 3-4 minutes , then eases off   Just started rosuvastatin   September 27, 2019:  Patrick Mathews was seen several months ago with onset of chest pressure.  These episodes would last several minutes and then eased off.  He has a history of hyperlipidemia and was started on rosuvastatin several months ago. Stress Myoview study was low risk.  No evidence of ischemia and had normal left ventricular systolic function. No further CP ,  Remains active   Past Medical History:  Diagnosis Date  . Adjacent segment disease with spinal stenosis   . Arthritis    "knees; back" (07/14/2014)  . Asthma   . CAD (coronary artery disease)   . Chest pain   . Exertional dyspnea   . GERD (gastroesophageal reflux disease)   . Hyperlipidemia   . Hypertension   . OSA on CPAP    no cpap  . Pneumonia 11/2013  . Primary localized osteoarthritis of left knee   . Seasonal allergies     Past Surgical History:  Procedure  Laterality Date  . ANTERIOR CERVICAL DECOMP/DISCECTOMY FUSION  2009   Dr Joya Salm  . BACK SURGERY    . BUNIONECTOMY Right  2013  . COLONOSCOPY N/A 11/06/2012   Procedure: COLONOSCOPY;  Surgeon: Jamesetta So, MD;  [facility];  Service: Gastroenterology;  Laterality: N/A;  . JOINT REPLACEMENT    . KNEE ARTHROSCOPY Bilateral 1990's  . [place]; 2009   Dr Joya Salm  . TONSILLECTOMY  1950's  . TOTAL KNEE ARTHROPLASTY Left 07/14/2014  . TOTAL KNEE ARTHROPLASTY Left 07/14/2014   Procedure: LEFT TOTAL KNEE ARTHROPLASTY;  Surgeon: [name], MD;  Location: [place];  Service: Orthopedics;  Laterality: Left;    Current Medications: Current Meds  Medication Sig  . aspirin EC 81 MG tablet Take 1 tablet (81 mg total) by mouth daily.  . Cholecalciferol (VITAMIN D3) 125 MCG (5000 UT) TBDP Take 5,000 Units by mouth daily.  [name] gabapentin (NEURONTIN) 300 MG capsule Take 300 mg by mouth 3 (three) times daily. 300mg  in the morning and 600mg  at night  . gabapentin (NEURONTIN) 600 MG tablet Take 600 mg by mouth at bedtime.  . Multiple Vitamin (MULTIVITAMIN ADULT PO) Take 1 tablet by mouth. 1-2 tablets  a day  . nitroGLYCERIN (NITROSTAT) 0.4 MG SL tablet Place 1 tablet (0.4 mg total) under the tongue every 5 (five) minutes as needed for chest pain.  [name] omeprazole (PRILOSEC) 20 MG capsule Take 20 mg by mouth daily before breakfast.  . rosuvastatin (CRESTOR) 10 MG tablet Take 10 mg by mouth daily.  . sildenafil (REVATIO) 20 MG tablet Take 40-100 mg by mouth daily as needed (erectile dysfunction).  . tadalafil (CIALIS) 5 MG tablet Take 5 mg by mouth as needed.     Allergies:   Morphine and related and Sulfa antibiotics   Social History   Socioeconomic History  . Marital status: Married    Spouse name: Not on file  . Number of children: Not on file  . Years of education: Not on file  . Highest education level: Not on file  Occupational History  . Not on file  Tobacco Use   . Smoking status: Current Some Day Smoker    Years: 30.00    Types: Cigars  . Smokeless tobacco: Never Used  . Tobacco comment: 07/14/2014 smoked cigs on and off x 40 yrs- none since 2014, but does smoke occ cigar-  Substance and Sexual Activity  . Alcohol use: Yes    Alcohol/week: 0.0 standard drinks    Comment: occ  . Drug use: No  . Sexual activity: Yes  Other Topics Concern  . Not on file  Social History Narrative  . Not on file   Social Determinants of Health   Financial Resource Strain:   . Difficulty of Paying Living Expenses:   Food Insecurity:   . Worried About Charity fundraiser in the Last Year:   . Arboriculturist in the Last Year:   Transportation Needs:   . Film/video editor (Medical):   [name] Lack of Transportation (Non-Medical):   Physical Activity:   . Days of Exercise per Week:   . Minutes of Exercise per Session:   Stress:   . Feeling of Stress :   Social Connections:   . Frequency of Communication with Friends and Family:   . Frequency of Social Gatherings with Friends and Family:   . Attends Religious Services:   . Active Member of Clubs or Organizations:   . Attends Archivist Meetings:   [name] Marital Status:      Family History: The patient's family history includes Alzheimer's disease in his father and mother; CAD in his maternal grandfather and mother; CVA in his mother; Dementia in his father; Heart attack in his maternal grandfather; Stroke in his maternal grandmother and paternal grandmother. There is no history of Colon cancer or Colon polyps.  ROS:   Please see the history of present illness.     All other systems reviewed and are negative.  EKGs/Labs/Other Studies Reviewed:    The following studies were reviewed today:    Recent Labs: No results found for requested labs within last 8760 hours.  Recent Lipid Panel No results found for: CHOL, TRIG, HDL, CHOLHDL, VLDL, LDLCALC, LDLDIRECT  Physical Exam:    Physical Exam:  Blood pressure 110/64, pulse 61, height 5\' 10"  (1.778 m), weight 225 lb 12 oz (102.4 kg), SpO2 99 %.  GEN:  Well nourished, well developed in no acute distress HEENT: Normal NECK: No JVD; No carotid bruits LYMPHATICS: No lymphadenopathy CARDIAC: RRR , no murmurs, rubs, gallops RESPIRATORY:  Clear to auscultation without rales, wheezing or rhonchi  ABDOMEN: Soft, non-tender, non-distended MUSCULOSKELETAL:  No edema;  No deformity  SKIN: Warm and dry NEUROLOGIC:  Alert and oriented x 3   EKG: [name]    ASSESSMENT:    No diagnosis found. PLAN:    In order of problems listed above:  Chest pain : He is not having any further episodes of chest pain or chest pressure.  He is improved to statin and this seems to have resolved the chest pains.  He had a stress Myoview study which revealed no evidence of ischemia and revealed normal left ventricular systolic function.  Myoview study was low risk.  At this point I will continue to see him on an as-needed basis.  Have encouraged him to continue to work on a good diet, exercise, weight loss program.  2.  Hyperlipidemia:   Managed by his primary medical doctor.  Medication Adjustments/Labs and Tests Ordered: Current medicines are reviewed at length with the patient today.  Concerns regarding medicines are outlined above.  No orders of the defined types were placed in this encounter.  No orders of the defined types were placed in this encounter.   Patient Instructions  Medication Instructions:  Your physician recommends that you continue on your current medications as directed. Please refer to the Current Medication list given to you today.  *If you need a refill on your cardiac medications before your next appointment, please call your pharmacy*   Lab Work: None ordered If you have labs (blood work) drawn today and your tests are completely normal, you will receive your results only by: [name] MyChart Message (if you have MyChart) OR . A  paper copy in the mail If you have any lab test that is abnormal or we need to change your treatment, we will call you to review the results.   Testing/Procedures: None ordered   Follow-Up: At [facility], you and your health needs are our priority.  As part of our continuing mission to provide you with exceptional heart care, we have created designated Provider Care Teams.  These Care Teams include your primary Cardiologist (physician) and Advanced Practice Providers (APPs -  Physician Assistants and Nurse Practitioners) who all work together to provide you with the care you need, when you need it.  We recommend signing up for the patient portal called "MyChart".  Sign up information is provided on this After Visit Summary.  MyChart is used to connect with patients for Virtual Visits (Telemedicine).  Patients are able to view lab/test results, encounter notes, upcoming appointments, etc.  Non-urgent messages can be sent to your provider as well.   To learn more about what you can do with MyChart, go to [email or web address].    Your next appointment:    as needed  The format for your next appointment:   In Person  Provider:   Mertie Moores, MD   Thank you for choosing [facility]!!      Other Instructions       Signed, Mertie Moores, MD  09/27/2019 9:08 AM    [place]

## 2019-10-15 DIAGNOSIS — D1801 Hemangioma of skin and subcutaneous tissue: Secondary | ICD-10-CM | POA: Diagnosis not present

## 2019-10-15 DIAGNOSIS — D485 Neoplasm of uncertain behavior of skin: Secondary | ICD-10-CM | POA: Diagnosis not present

## 2019-10-25 ENCOUNTER — Other Ambulatory Visit: Payer: Self-pay | Admitting: Neurosurgery

## 2019-10-25 DIAGNOSIS — M5416 Radiculopathy, lumbar region: Secondary | ICD-10-CM | POA: Diagnosis not present

## 2019-10-25 DIAGNOSIS — M545 Low back pain: Secondary | ICD-10-CM | POA: Diagnosis not present

## 2019-11-11 DIAGNOSIS — M5127 Other intervertebral disc displacement, lumbosacral region: Secondary | ICD-10-CM | POA: Diagnosis not present

## 2019-11-11 DIAGNOSIS — E7849 Other hyperlipidemia: Secondary | ICD-10-CM | POA: Diagnosis not present

## 2019-11-28 ENCOUNTER — Ambulatory Visit
Admission: RE | Admit: 2019-11-28 | Discharge: 2019-11-28 | Disposition: A | Discharge status: Home / Self Care | Payer: Medicare Other | Source: Ambulatory Visit | Attending: Neurosurgery | Admitting: Neurosurgery

## 2019-11-28 DIAGNOSIS — M5416 Radiculopathy, lumbar region: Secondary | ICD-10-CM

## 2019-11-28 MED ORDER — GADOBENATE DIMEGLUMINE 529 MG/ML IV SOLN
20.0000 mL | Freq: Once | INTRAVENOUS | Status: AC | PRN
  Administered 2019-11-28: 20 mL via INTRAVENOUS

## 2019-12-10 ENCOUNTER — Other Ambulatory Visit: Payer: Self-pay | Admitting: Neurosurgery

## 2020-01-30 ENCOUNTER — Other Ambulatory Visit: Payer: Self-pay | Admitting: Neurosurgery

## 2020-02-03 NOTE — Pre-Procedure Instructions (Signed)
Your procedure is scheduled on Thursday, August 26th.  Report to [facility] Main Entrance "A" at 10:00 A.M., and check in at the Admitting office.  Call this number if you have problems the morning of surgery:  [phone]  Call [phone] if you have any questions prior to your surgery date Monday-Friday 8am-4pm    Remember:  Do not eat or drink after midnight the night before your surgery   Take these medicines the morning of surgery with A SIP OF WATER  omeprazole (PRILOSEC) rosuvastatin (CRESTOR) gabapentin (NEURONTIN) -as needed  nitroGLYCERIN (NITROSTAT)-as needed; please inform nurse if you had to use this.   Follow your surgeon's instructions on when to stop Aspirin.  If no instructions were given by your surgeon then you will need to call the office to get those instructions.    As of today, STOP taking any Aspirin (unless otherwise instructed by your surgeon) Aleve, Naproxen, Ibuprofen, Motrin, Advil, Goody's, BC's, all herbal medications, fish oil, and all vitamins.                      Do not wear jewelry.            Do not wear lotions, powders, colognes, or deodorant.            Men may shave face and neck.            Do not bring valuables to the hospital.            [facility] is not responsible for any belongings or valuables.  Do NOT Smoke (Tobacco/Vaping) or drink Alcohol 24 hours prior to your procedure If you use a CPAP at night, you may bring all equipment for your overnight stay.   Contacts, glasses, dentures or bridgework may not be worn into surgery.      For patients admitted to the hospital, discharge time will be determined by your treatment team.   Patients discharged the day of surgery will not be allowed to drive home, and someone needs to stay with them for 24 hours.    Special instructions:   [place]- Preparing For Surgery  Before surgery, you can play an important role. Because skin is not sterile, your skin needs to be as free of  germs as possible. You can reduce the number of germs on your skin by washing with CHG (chlorahexidine gluconate) Soap before surgery.  CHG is an antiseptic cleaner which kills germs and bonds with the skin to continue killing germs even after washing.    Oral Hygiene is also important to reduce your risk of infection.  Remember - BRUSH YOUR TEETH THE MORNING OF SURGERY WITH YOUR REGULAR TOOTHPASTE  Please do not use if you have an allergy to CHG or antibacterial soaps. If your skin becomes reddened/irritated stop using the CHG.  Do not shave (including legs and underarms) for at least 48 hours prior to first CHG shower. It is OK to shave your face.  Please follow these instructions carefully.   1. Shower the NIGHT BEFORE SURGERY and the MORNING OF SURGERY with CHG Soap.   2. If you chose to wash your hair, wash your hair first as usual with your normal shampoo.  3. After you shampoo, rinse your hair and body thoroughly to remove the shampoo.  4. Use CHG as you would any other liquid soap. You can apply CHG directly to the skin and wash gently with a scrungie or a clean washcloth.  5. Apply the CHG Soap to your body ONLY FROM THE NECK DOWN.  Do not use on open wounds or open sores. Avoid contact with your eyes, ears, mouth and genitals (private parts). Wash Face and genitals (private parts)  with your normal soap.   6. Wash thoroughly, paying special attention to the area where your surgery will be performed.  7. Thoroughly rinse your body with warm water from the neck down.  8. DO NOT shower/wash with your normal soap after using and rinsing off the CHG Soap.  9. Pat yourself dry with a CLEAN TOWEL.  10. Wear CLEAN PAJAMAS to bed the night before surgery  11. Place CLEAN SHEETS on your bed the night of your first shower and DO NOT SLEEP WITH PETS.   Day of Surgery: Wear Clean/Comfortable clothing the morning of surgery Do not apply any deodorants/lotions.   Remember to brush your  teeth WITH YOUR REGULAR TOOTHPASTE.   Please read over the following fact sheets that you were given.

## 2020-02-04 ENCOUNTER — Encounter (HOSPITAL_COMMUNITY): Payer: Self-pay

## 2020-02-04 ENCOUNTER — Encounter (HOSPITAL_COMMUNITY)
Admission: RE | Admit: 2020-02-04 | Discharge: 2020-02-04 | Disposition: A | Discharge status: Home / Self Care | Payer: Medicare Other | Source: Ambulatory Visit | Attending: Neurosurgery | Admitting: Neurosurgery

## 2020-02-04 ENCOUNTER — Other Ambulatory Visit: Payer: Self-pay

## 2020-02-04 ENCOUNTER — Other Ambulatory Visit (HOSPITAL_COMMUNITY)
Admission: RE | Admit: 2020-02-04 | Discharge: 2020-02-04 | Disposition: A | Discharge status: Home / Self Care | Payer: Medicare Other | Source: Ambulatory Visit | Attending: Neurosurgery | Admitting: Neurosurgery

## 2020-02-04 DIAGNOSIS — Z01812 Encounter for preprocedural laboratory examination: Secondary | ICD-10-CM | POA: Insufficient documentation

## 2020-02-04 DIAGNOSIS — Z20822 Contact with and (suspected) exposure to covid-19: Secondary | ICD-10-CM | POA: Insufficient documentation

## 2020-02-04 DIAGNOSIS — G4733 Obstructive sleep apnea (adult) (pediatric): Secondary | ICD-10-CM | POA: Insufficient documentation

## 2020-02-04 DIAGNOSIS — Z01810 Encounter for preprocedural cardiovascular examination: Secondary | ICD-10-CM | POA: Diagnosis not present

## 2020-02-04 LAB — SURGICAL PCR SCREEN
MRSA, PCR: NEGATIVE
Staphylococcus aureus: NEGATIVE

## 2020-02-04 LAB — BASIC METABOLIC PANEL
Anion gap: 8 (ref 5–15)
BUN: 16 mg/dL (ref 8–23)
CO2: 26 mmol/L (ref 22–32)
Calcium: 9.2 mg/dL (ref 8.9–10.3)
Chloride: 107 mmol/L (ref 98–111)
Creatinine, Ser: 1.23 mg/dL (ref 0.61–1.24)
GFR calc Af Amer: >60 mL/min (ref >60)
GFR calc non Af Amer: 60 mL/min — ABNORMAL LOW (ref >60)
Glucose, Bld: 111 mg/dL — ABNORMAL HIGH (ref 70–99)
Potassium: 4.1 mmol/L (ref 3.5–5.1)
Sodium: 141 mmol/L (ref 135–145)

## 2020-02-04 LAB — CBC
HCT: 45.5 % (ref 39.0–52.0)
Hemoglobin: 15.4 g/dL (ref 13.0–17.0)
MCH: 31.3 pg (ref 26.0–34.0)
MCHC: 33.8 g/dL (ref 30.0–36.0)
MCV: 92.5 fL (ref 80.0–100.0)
Platelets: 175 10*3/uL (ref 150–400)
RBC: 4.92 MIL/uL (ref 4.22–5.81)
RDW: 12.9 % (ref 11.5–15.5)
WBC: 4.5 10*3/uL (ref 4.0–10.5)
nRBC: 0 % (ref 0.0–0.2)

## 2020-02-04 LAB — TYPE AND SCREEN
ABO/RH(D): O POS
Antibody Screen: NEGATIVE

## 2020-02-04 LAB — SARS CORONAVIRUS 2 (TAT 6-24 HRS): SARS Coronavirus 2: NEGATIVE

## 2020-02-04 NOTE — Progress Notes (Signed)
PCP - Dr. Sharilyn Sites Cardiologist - Dr. Acie Fredrickson  Chest x-ray - 05/24/19 EKG - 07/11/19 Stress Test - 07/22/19-low risk study ECHO - denies Cardiac Cath - denies  Sleep Study - OSA+ CPAP - denies use  Blood Thinner Instructions:n/a Aspirin Instructions: LD 02/01/20. Instructed to hold 3-5 days prior to surgery.   ERAS Protcol -n/a PRE-SURGERY Ensure or G2- n/a  COVID TEST- Will go today after PAT appointment.    Anesthesia review: Yes, hx of CAD.   Patient denies shortness of breath, fever, cough and chest pain at PAT appointment   All instructions explained to the patient, with a verbal understanding of the material. Patient agrees to go over the instructions while at home for a better understanding. Patient also instructed to self quarantine after being tested for COVID-19. The opportunity to ask questions was provided.   Coronavirus Screening  Have you experienced the following symptoms:  Cough yes/no: No Fever (>100.70F)  yes/no: No Runny nose yes/no: No Sore throat yes/no: No Difficulty breathing/shortness of breath  yes/no: No  Have you or a family member traveled in the last 14 days and where? yes/no: No   If the patient indicates "YES" to the above questions, their PAT will be rescheduled to limit the exposure to others and, the surgeon will be notified. THE PATIENT WILL NEED TO BE ASYMPTOMATIC FOR 14 DAYS.   If the patient is not experiencing any of these symptoms, the PAT nurse will instruct them to NOT bring anyone with them to their appointment since they may have these symptoms or traveled as well.   Please remind your patients and families that hospital visitation restrictions are in effect and the importance of the restrictions.

## 2020-02-05 NOTE — Anesthesia Preprocedure Evaluation (Addendum)
Anesthesia Evaluation  Patient identified by MRN, date of birth, ID band Patient awake    Reviewed: Allergy & Precautions, NPO status , Patient's Chart, lab work & pertinent test results  Airway Mallampati: III  TM Distance: >3 FB Neck ROM: Full    Dental no notable dental hx. (+) Teeth Intact, Dental Advisory Given   Pulmonary asthma , sleep apnea (mild OSA per pt, does not use CPAP) , Current Smoker and Patient abstained from smoking.,    Pulmonary exam normal breath sounds clear to auscultation       Cardiovascular hypertension, + CAD  Normal cardiovascular exam Rhythm:Regular Rate:Normal  Nuclear stress 07/22/2019: Nuclear stress EF: 70%. There was no ST segment deviation noted during stress. The study is normal. This is a low risk study. The left ventricular ejection fraction is hyperdynamic (>65%).   Normal resting and stress perfusion. No ischemia or infarction EF 70% Baseline ECG with RBBB     Neuro/Psych negative neurological ROS  negative psych ROS   GI/Hepatic Neg liver ROS, GERD  Medicated and Controlled,  Endo/Other  BMI 31  Renal/GU negative Renal ROS  negative genitourinary   Musculoskeletal  (+) Arthritis , Osteoarthritis,  Lumbar spinal stenosis with neurogenic claudication   Abdominal   Peds  Hematology negative hematology ROS (+) hct 45.5, plt 175   Anesthesia Other Findings   Reproductive/Obstetrics negative OB ROS                         Anesthesia Physical Anesthesia Plan  ASA: III  Anesthesia Plan: General   Post-op Pain Management:    Induction: Intravenous  PONV Risk Score and Plan: 1 and Ondansetron, Dexamethasone and Treatment may vary due to age or medical condition  Airway Management Planned: Oral ETT and Video Laryngoscope Planned  Additional Equipment: None  Intra-op Plan:   Post-operative Plan: Extubation in OR  Informed Consent: I have  reviewed the patients History and Physical, chart, labs and discussed the procedure including the risks, benefits and alternatives for the proposed anesthesia with the patient or authorized representative who has indicated his/her understanding and acceptance.     Dental advisory given  Plan Discussed with: CRNA  Anesthesia Plan Comments: ( )     Anesthesia Quick Evaluation

## 2020-02-05 NOTE — Progress Notes (Signed)
Anesthesia Chart Review:  Patient recently evaluated by cardiologist Dr. Acie Fredrickson for report of chest pain/pressure.  He was reportedly quite active, golfing, working in the yard, walking and reported that his chest discomfort generally happened in the evenings while sitting in recliner several hours after dinner.  Dr. Acie Fredrickson ordered stress Myoview which showed no evidence of ischemia and normal left ventricular systolic function.  He was last seen by Dr. Acie Fredrickson 09/27/2019 and doing well at that time, he was advised to follow-up on an as-needed basis.  OSA not on CPAP.  Preop labs reviewed, unremarkable.  EKG 07/11/2019: Sinus bradycardia.  Rate 59.  Nuclear stress 07/22/2019:  Nuclear stress EF: 70%.  There was no ST segment deviation noted during stress.  The study is normal.  This is a low risk study.  The left ventricular ejection fraction is hyperdynamic (>65%).   Normal resting and stress perfusion. No ischemia or infarction EF 70% Baseline ECG with RBBB   Wynonia Musty [facility] Stay [facility]/Anesthesiology Phone [phone] 02/05/2020 9:25 AM

## 2020-02-06 ENCOUNTER — Inpatient Hospital Stay (HOSPITAL_COMMUNITY): Payer: Medicare Other | Admitting: Anesthesiology

## 2020-02-06 ENCOUNTER — Observation Stay (HOSPITAL_COMMUNITY)
Admission: RE | Admit: 2020-02-06 | Discharge: 2020-02-07 | Disposition: A | Discharge status: Home / Self Care | Payer: Medicare Other | Attending: Neurosurgery | Admitting: Neurosurgery

## 2020-02-06 ENCOUNTER — Inpatient Hospital Stay (HOSPITAL_COMMUNITY): Payer: Medicare Other

## 2020-02-06 ENCOUNTER — Inpatient Hospital Stay (HOSPITAL_COMMUNITY): Payer: Medicare Other | Admitting: Physician Assistant

## 2020-02-06 ENCOUNTER — Encounter (HOSPITAL_COMMUNITY): Payer: Self-pay | Admitting: Neurosurgery

## 2020-02-06 ENCOUNTER — Encounter (HOSPITAL_COMMUNITY)
Admission: RE | Disposition: A | Discharge status: Home / Self Care | Payer: Self-pay | Source: Home / Self Care | Attending: Neurosurgery

## 2020-02-06 DIAGNOSIS — M4326 Fusion of spine, lumbar region: Secondary | ICD-10-CM | POA: Diagnosis not present

## 2020-02-06 DIAGNOSIS — M545 Low back pain: Secondary | ICD-10-CM | POA: Diagnosis present

## 2020-02-06 DIAGNOSIS — I10 Essential (primary) hypertension: Secondary | ICD-10-CM | POA: Diagnosis not present

## 2020-02-06 DIAGNOSIS — Z79899 Other long term (current) drug therapy: Secondary | ICD-10-CM | POA: Diagnosis not present

## 2020-02-06 DIAGNOSIS — Z981 Arthrodesis status: Secondary | ICD-10-CM | POA: Diagnosis not present

## 2020-02-06 DIAGNOSIS — F17290 Nicotine dependence, other tobacco product, uncomplicated: Secondary | ICD-10-CM | POA: Insufficient documentation

## 2020-02-06 DIAGNOSIS — M5136 Other intervertebral disc degeneration, lumbar region: Secondary | ICD-10-CM

## 2020-02-06 DIAGNOSIS — E785 Hyperlipidemia, unspecified: Secondary | ICD-10-CM | POA: Insufficient documentation

## 2020-02-06 DIAGNOSIS — J449 Chronic obstructive pulmonary disease, unspecified: Secondary | ICD-10-CM | POA: Diagnosis not present

## 2020-02-06 DIAGNOSIS — G4733 Obstructive sleep apnea (adult) (pediatric): Secondary | ICD-10-CM | POA: Insufficient documentation

## 2020-02-06 DIAGNOSIS — I2510 Atherosclerotic heart disease of native coronary artery without angina pectoris: Secondary | ICD-10-CM | POA: Diagnosis not present

## 2020-02-06 DIAGNOSIS — Z7982 Long term (current) use of aspirin: Secondary | ICD-10-CM | POA: Diagnosis not present

## 2020-02-06 DIAGNOSIS — M4316 Spondylolisthesis, lumbar region: Secondary | ICD-10-CM | POA: Diagnosis not present

## 2020-02-06 DIAGNOSIS — K219 Gastro-esophageal reflux disease without esophagitis: Secondary | ICD-10-CM | POA: Diagnosis not present

## 2020-02-06 DIAGNOSIS — Z419 Encounter for procedure for purposes other than remedying health state, unspecified: Secondary | ICD-10-CM

## 2020-02-06 DIAGNOSIS — M48062 Spinal stenosis, lumbar region with neurogenic claudication: Secondary | ICD-10-CM | POA: Diagnosis not present

## 2020-02-06 DIAGNOSIS — M5116 Intervertebral disc disorders with radiculopathy, lumbar region: Secondary | ICD-10-CM | POA: Diagnosis not present

## 2020-02-06 SURGERY — POSTERIOR LUMBAR FUSION 1 WITH HARDWARE REMOVAL
Anesthesia: General

## 2020-02-06 MED ORDER — BACITRACIN ZINC 500 UNIT/GM EX OINT
TOPICAL_OINTMENT | CUTANEOUS | Status: DC | PRN
  Administered 2020-02-06: 1 via TOPICAL

## 2020-02-06 MED ORDER — OXYCODONE HCL 5 MG PO TABS
5.0000 mg | ORAL_TABLET | ORAL | Status: DC | PRN
  Administered 2020-02-07: 5 mg via ORAL
  Filled 2020-02-06: qty 1

## 2020-02-06 MED ORDER — SODIUM CHLORIDE 0.9% FLUSH
3.0000 mL | Freq: Two times a day (BID) | INTRAVENOUS | Status: DC
  Administered 2020-02-06: 3 mL via INTRAVENOUS

## 2020-02-06 MED ORDER — CEFAZOLIN SODIUM-DEXTROSE 2-4 GM/100ML-% IV SOLN
INTRAVENOUS | Status: AC
  Filled 2020-02-06: qty 100

## 2020-02-06 MED ORDER — MIDAZOLAM HCL 2 MG/2ML IJ SOLN
INTRAMUSCULAR | Status: DC | PRN
  Administered 2020-02-06: 2 mg via INTRAVENOUS

## 2020-02-06 MED ORDER — BACITRACIN ZINC 500 UNIT/GM EX OINT
TOPICAL_OINTMENT | CUTANEOUS | Status: AC
  Filled 2020-02-06: qty 28.35

## 2020-02-06 MED ORDER — MIDAZOLAM HCL 2 MG/2ML IJ SOLN
INTRAMUSCULAR | Status: AC
  Filled 2020-02-06: qty 2

## 2020-02-06 MED ORDER — PROPOFOL 10 MG/ML IV BOLUS
INTRAVENOUS | Status: AC
  Filled 2020-02-06: qty 20

## 2020-02-06 MED ORDER — PROPOFOL 10 MG/ML IV BOLUS
INTRAVENOUS | Status: DC | PRN
  Administered 2020-02-06: 150 mg via INTRAVENOUS

## 2020-02-06 MED ORDER — ONDANSETRON HCL 4 MG/2ML IJ SOLN: 4.0000 mg | Freq: Once | INTRAMUSCULAR | Status: DC | PRN

## 2020-02-06 MED ORDER — THROMBIN 5000 UNITS EX SOLR
OROMUCOSAL | Status: DC | PRN
  Administered 2020-02-06: 5 mL via TOPICAL

## 2020-02-06 MED ORDER — GABAPENTIN 600 MG PO TABS
600.0000 mg | ORAL_TABLET | Freq: Every day | ORAL | Status: DC
  Administered 2020-02-06: 600 mg via ORAL
  Filled 2020-02-06: qty 1

## 2020-02-06 MED ORDER — NITROGLYCERIN 0.4 MG SL SUBL: 0.4000 mg | SUBLINGUAL_TABLET | SUBLINGUAL | Status: DC | PRN

## 2020-02-06 MED ORDER — CHLORHEXIDINE GLUCONATE CLOTH 2 % EX PADS: 6.0000 | MEDICATED_PAD | Freq: Once | CUTANEOUS | Status: DC

## 2020-02-06 MED ORDER — HYDROMORPHONE HCL 1 MG/ML IJ SOLN: 1.0000 mg | INTRAMUSCULAR | Status: DC | PRN

## 2020-02-06 MED ORDER — PHENOL 1.4 % MT LIQD: 1.0000 | OROMUCOSAL | Status: DC | PRN

## 2020-02-06 MED ORDER — BUPIVACAINE-EPINEPHRINE (PF) 0.5% -1:200000 IJ SOLN
INTRAMUSCULAR | Status: DC | PRN
  Administered 2020-02-06: 10 mL

## 2020-02-06 MED ORDER — HYDROMORPHONE HCL 1 MG/ML IJ SOLN: 0.2500 mg | INTRAMUSCULAR | Status: DC | PRN

## 2020-02-06 MED ORDER — MENTHOL 3 MG MT LOZG: 1.0000 | LOZENGE | OROMUCOSAL | Status: DC | PRN

## 2020-02-06 MED ORDER — 0.9 % SODIUM CHLORIDE (POUR BTL) OPTIME
TOPICAL | Status: DC | PRN
  Administered 2020-02-06: 1000 mL

## 2020-02-06 MED ORDER — CEFAZOLIN SODIUM-DEXTROSE 2-4 GM/100ML-% IV SOLN
2.0000 g | Freq: Three times a day (TID) | INTRAVENOUS | Status: AC
  Administered 2020-02-06 – 2020-02-07 (×2): 2 g via INTRAVENOUS
  Filled 2020-02-06 (×2): qty 100

## 2020-02-06 MED ORDER — CHLORHEXIDINE GLUCONATE 0.12 % MT SOLN
OROMUCOSAL | Status: AC
  Administered 2020-02-06: 15 mL via OROMUCOSAL
  Filled 2020-02-06: qty 15

## 2020-02-06 MED ORDER — ROCURONIUM BROMIDE 100 MG/10ML IV SOLN
INTRAVENOUS | Status: DC | PRN
  Administered 2020-02-06: 100 mg via INTRAVENOUS
  Administered 2020-02-06: 10 mg via INTRAVENOUS

## 2020-02-06 MED ORDER — OXYCODONE HCL 5 MG/5ML PO SOLN: 5.0000 mg | Freq: Once | ORAL | Status: DC | PRN

## 2020-02-06 MED ORDER — LIDOCAINE 2% (20 MG/ML) 5 ML SYRINGE
INTRAMUSCULAR | Status: AC
  Filled 2020-02-06: qty 5

## 2020-02-06 MED ORDER — CYCLOBENZAPRINE HCL 10 MG PO TABS
10.0000 mg | ORAL_TABLET | Freq: Three times a day (TID) | ORAL | Status: DC | PRN
  Administered 2020-02-06 – 2020-02-07 (×2): 10 mg via ORAL
  Filled 2020-02-06 (×2): qty 1

## 2020-02-06 MED ORDER — EPHEDRINE SULFATE 50 MG/ML IJ SOLN: INTRAMUSCULAR | Status: DC | PRN

## 2020-02-06 MED ORDER — OXYCODONE HCL 5 MG PO TABS: 5.0000 mg | ORAL_TABLET | Freq: Once | ORAL | Status: DC | PRN

## 2020-02-06 MED ORDER — ROCURONIUM BROMIDE 10 MG/ML (PF) SYRINGE
PREFILLED_SYRINGE | INTRAVENOUS | Status: AC
  Filled 2020-02-06: qty 10

## 2020-02-06 MED ORDER — ACETAMINOPHEN 500 MG PO TABS
ORAL_TABLET | ORAL | Status: AC
  Administered 2020-02-06: 1000 mg via ORAL
  Filled 2020-02-06: qty 2

## 2020-02-06 MED ORDER — ACETAMINOPHEN 650 MG RE SUPP: 650.0000 mg | RECTAL | Status: DC | PRN

## 2020-02-06 MED ORDER — PANTOPRAZOLE SODIUM 40 MG PO TBEC
40.0000 mg | DELAYED_RELEASE_TABLET | Freq: Every day | ORAL | Status: DC
  Administered 2020-02-07: 40 mg via ORAL
  Filled 2020-02-06: qty 1

## 2020-02-06 MED ORDER — LIDOCAINE HCL (CARDIAC) PF 100 MG/5ML IV SOSY
PREFILLED_SYRINGE | INTRAVENOUS | Status: DC | PRN
  Administered 2020-02-06: 60 mg via INTRAVENOUS

## 2020-02-06 MED ORDER — EPHEDRINE SULFATE 50 MG/ML IJ SOLN
INTRAMUSCULAR | Status: DC | PRN
  Administered 2020-02-06 (×2): 5 mg via INTRAVENOUS
  Administered 2020-02-06: 10 mg via INTRAVENOUS
  Administered 2020-02-06: 15 mg via INTRAVENOUS

## 2020-02-06 MED ORDER — DOCUSATE SODIUM 100 MG PO CAPS
100.0000 mg | ORAL_CAPSULE | Freq: Two times a day (BID) | ORAL | Status: DC
  Administered 2020-02-06 – 2020-02-07 (×2): 100 mg via ORAL
  Filled 2020-02-06 (×2): qty 1

## 2020-02-06 MED ORDER — ACETAMINOPHEN 500 MG PO TABS: 1000.0000 mg | ORAL_TABLET | Freq: Once | ORAL | Status: AC

## 2020-02-06 MED ORDER — ONDANSETRON HCL 4 MG PO TABS: 4.0000 mg | ORAL_TABLET | Freq: Four times a day (QID) | ORAL | Status: DC | PRN

## 2020-02-06 MED ORDER — BUPIVACAINE LIPOSOME 1.3 % IJ SUSP
INTRAMUSCULAR | Status: DC | PRN
  Administered 2020-02-06: 20 mL

## 2020-02-06 MED ORDER — OXYCODONE HCL 5 MG PO TABS: 10.0000 mg | ORAL_TABLET | ORAL | Status: DC | PRN

## 2020-02-06 MED ORDER — CHLORHEXIDINE GLUCONATE 0.12 % MT SOLN: 15.0000 mL | Freq: Once | OROMUCOSAL | Status: AC

## 2020-02-06 MED ORDER — SODIUM CHLORIDE 0.9 % IV SOLN
INTRAVENOUS | Status: DC | PRN
  Administered 2020-02-06: 500 mL

## 2020-02-06 MED ORDER — ROSUVASTATIN CALCIUM 5 MG PO TABS
10.0000 mg | ORAL_TABLET | Freq: Every day | ORAL | Status: DC
  Administered 2020-02-07: 10 mg via ORAL
  Filled 2020-02-06: qty 2

## 2020-02-06 MED ORDER — ONDANSETRON HCL 4 MG/2ML IJ SOLN: 4.0000 mg | Freq: Four times a day (QID) | INTRAMUSCULAR | Status: DC | PRN

## 2020-02-06 MED ORDER — LACTATED RINGERS IV SOLN: INTRAVENOUS | Status: DC

## 2020-02-06 MED ORDER — CEFAZOLIN SODIUM-DEXTROSE 2-4 GM/100ML-% IV SOLN
2.0000 g | INTRAVENOUS | Status: AC
  Administered 2020-02-06: 2 g via INTRAVENOUS

## 2020-02-06 MED ORDER — BISACODYL 10 MG RE SUPP: 10.0000 mg | Freq: Every day | RECTAL | Status: DC | PRN

## 2020-02-06 MED ORDER — SODIUM CHLORIDE 0.9% FLUSH: 3.0000 mL | INTRAVENOUS | Status: DC | PRN

## 2020-02-06 MED ORDER — ACETAMINOPHEN 325 MG PO TABS: 650.0000 mg | ORAL_TABLET | ORAL | Status: DC | PRN

## 2020-02-06 MED ORDER — BUPIVACAINE LIPOSOME 1.3 % IJ SUSP
20.0000 mL | Freq: Once | INTRAMUSCULAR | Status: DC
  Filled 2020-02-06: qty 20

## 2020-02-06 MED ORDER — ACETAMINOPHEN 500 MG PO TABS
1000.0000 mg | ORAL_TABLET | Freq: Four times a day (QID) | ORAL | Status: DC
  Administered 2020-02-06 – 2020-02-07 (×2): 1000 mg via ORAL
  Filled 2020-02-06 (×2): qty 2

## 2020-02-06 MED ORDER — GABAPENTIN 300 MG PO CAPS: 300.0000 mg | ORAL_CAPSULE | Freq: Every day | ORAL | Status: DC | PRN

## 2020-02-06 MED ORDER — ORAL CARE MOUTH RINSE: 15.0000 mL | Freq: Once | OROMUCOSAL | Status: AC

## 2020-02-06 MED ORDER — SUGAMMADEX SODIUM 200 MG/2ML IV SOLN
INTRAVENOUS | Status: DC | PRN
  Administered 2020-02-06: 200 mg via INTRAVENOUS

## 2020-02-06 MED ORDER — BUPIVACAINE-EPINEPHRINE 0.5% -1:200000 IJ SOLN
INTRAMUSCULAR | Status: AC
  Filled 2020-02-06: qty 1

## 2020-02-06 MED ORDER — ONDANSETRON HCL 4 MG/2ML IJ SOLN
INTRAMUSCULAR | Status: DC | PRN
  Administered 2020-02-06: 4 mg via INTRAVENOUS

## 2020-02-06 MED ORDER — FENTANYL CITRATE (PF) 250 MCG/5ML IJ SOLN
INTRAMUSCULAR | Status: DC | PRN
  Administered 2020-02-06: 50 ug via INTRAVENOUS
  Administered 2020-02-06 (×2): 100 ug via INTRAVENOUS
  Administered 2020-02-06: 50 ug via INTRAVENOUS

## 2020-02-06 MED ORDER — ONDANSETRON HCL 4 MG/2ML IJ SOLN
INTRAMUSCULAR | Status: AC
  Filled 2020-02-06: qty 2

## 2020-02-06 MED ORDER — PHENYLEPHRINE HCL-NACL 10-0.9 MG/250ML-% IV SOLN
INTRAVENOUS | Status: DC | PRN
  Administered 2020-02-06: 25 ug/min via INTRAVENOUS

## 2020-02-06 MED ORDER — FENTANYL CITRATE (PF) 250 MCG/5ML IJ SOLN
INTRAMUSCULAR | Status: AC
  Filled 2020-02-06: qty 5

## 2020-02-06 MED ORDER — THROMBIN 5000 UNITS EX SOLR
CUTANEOUS | Status: AC
  Filled 2020-02-06: qty 5000

## 2020-02-06 MED ORDER — DEXAMETHASONE SODIUM PHOSPHATE 10 MG/ML IJ SOLN
INTRAMUSCULAR | Status: AC
  Filled 2020-02-06: qty 1

## 2020-02-06 MED ORDER — DEXAMETHASONE SODIUM PHOSPHATE 10 MG/ML IJ SOLN
INTRAMUSCULAR | Status: DC | PRN
  Administered 2020-02-06: 10 mg via INTRAVENOUS

## 2020-02-06 MED ORDER — SODIUM CHLORIDE 0.9 % IV SOLN: 250.0000 mL | INTRAVENOUS | Status: DC

## 2020-02-06 SURGICAL SUPPLY — 64 items
BAG DECANTER FOR FLEXI CONT (MISCELLANEOUS) ×3 IMPLANT
BENZOIN TINCTURE PRP APPL 2/3 (GAUZE/BANDAGES/DRESSINGS) ×3 IMPLANT
BLADE CLIPPER SURG (BLADE) IMPLANT
BUR MATCHSTICK NEURO 3.0 LAGG (BURR) ×3 IMPLANT
BUR PRECISION FLUTE 6.0 (BURR) ×3 IMPLANT
CAGE ALTERA 10X31X9-13 15D (Cage) ×2 IMPLANT
CAGE ALTERA 9-13-15-31MM (Cage) ×1 IMPLANT
CANISTER SUCT 3000ML PPV (MISCELLANEOUS) ×3 IMPLANT
CARTRIDGE OIL MAESTRO DRILL (MISCELLANEOUS) ×1 IMPLANT
CLOSURE WOUND 1/2 X4 (GAUZE/BANDAGES/DRESSINGS) ×1
CNTNR URN SCR LID CUP LEK RST (MISCELLANEOUS) ×1 IMPLANT
CONT SPEC 4OZ STRL OR WHT (MISCELLANEOUS) ×3
COVER BACK TABLE 60X90IN (DRAPES) ×3 IMPLANT
COVER WAND RF STERILE (DRAPES) IMPLANT
DECANTER SPIKE VIAL GLASS SM (MISCELLANEOUS) ×3 IMPLANT
DIFFUSER DRILL AIR PNEUMATIC (MISCELLANEOUS) ×3 IMPLANT
DRAPE C-ARM 42X72 X-RAY (DRAPES) ×6 IMPLANT
DRAPE HALF SHEET 40X57 (DRAPES) ×3 IMPLANT
DRAPE LAPAROTOMY 100X72X124 (DRAPES) ×3 IMPLANT
DRAPE SURG 17X23 STRL (DRAPES) ×12 IMPLANT
DRSG OPSITE POSTOP 4X6 (GAUZE/BANDAGES/DRESSINGS) ×3 IMPLANT
DRSG OPSITE POSTOP 4X8 (GAUZE/BANDAGES/DRESSINGS) ×3 IMPLANT
ELECT BLADE 4.0 EZ CLEAN MEGAD (MISCELLANEOUS) ×3
ELECT REM PT RETURN 9FT ADLT (ELECTROSURGICAL) ×3
ELECTRODE BLDE 4.0 EZ CLN MEGD (MISCELLANEOUS) ×1 IMPLANT
ELECTRODE REM PT RTRN 9FT ADLT (ELECTROSURGICAL) ×1 IMPLANT
GAUZE 4X4 16PLY RFD (DISPOSABLE) ×3 IMPLANT
GAUZE SPONGE 4X4 12PLY STRL (GAUZE/BANDAGES/DRESSINGS) ×3 IMPLANT
GLOVE BIO SURGEON STRL SZ8 (GLOVE) ×6 IMPLANT
GLOVE BIO SURGEON STRL SZ8.5 (GLOVE) ×6 IMPLANT
GLOVE EXAM NITRILE XL STR (GLOVE) IMPLANT
GOWN STRL REUS W/ TWL LRG LVL3 (GOWN DISPOSABLE) IMPLANT
GOWN STRL REUS W/ TWL XL LVL3 (GOWN DISPOSABLE) ×2 IMPLANT
GOWN STRL REUS W/TWL 2XL LVL3 (GOWN DISPOSABLE) IMPLANT
GOWN STRL REUS W/TWL LRG LVL3 (GOWN DISPOSABLE)
GOWN STRL REUS W/TWL XL LVL3 (GOWN DISPOSABLE) ×6
HEMOSTAT POWDER KIT SURGIFOAM (HEMOSTASIS) ×3 IMPLANT
KIT BASIN OR (CUSTOM PROCEDURE TRAY) ×3 IMPLANT
KIT TURNOVER KIT B (KITS) ×3 IMPLANT
MILL MEDIUM DISP (BLADE) ×3 IMPLANT
NEEDLE HYPO 21X1.5 SAFETY (NEEDLE) IMPLANT
NEEDLE HYPO 22GX1.5 SAFETY (NEEDLE) ×3 IMPLANT
NS IRRIG 1000ML POUR BTL (IV SOLUTION) ×3 IMPLANT
OIL CARTRIDGE MAESTRO DRILL (MISCELLANEOUS) ×3
PACK LAMINECTOMY NEURO (CUSTOM PROCEDURE TRAY) ×3 IMPLANT
PAD ARMBOARD 7.5X6 YLW CONV (MISCELLANEOUS) ×9 IMPLANT
PATTIES SURGICAL .5 X1 (DISPOSABLE) IMPLANT
PENCIL BUTTON HOLSTER BLD 10FT (ELECTRODE) ×3 IMPLANT
PUTTY DBM 10CC CALC GRAN (Putty) ×3 IMPLANT
ROD L635 90MM PREBENT (Rod) ×6 IMPLANT
SCREW PEDICLE VA L635 7.5X55M (Screw) ×6 IMPLANT
SCREW SET BREAK OFF (Screw) ×21 IMPLANT
SPONGE LAP 4X18 RFD (DISPOSABLE) IMPLANT
SPONGE NEURO XRAY DETECT 1X3 (DISPOSABLE) IMPLANT
SPONGE SURGIFOAM ABS GEL 100 (HEMOSTASIS) IMPLANT
STRIP CLOSURE SKIN 1/2X4 (GAUZE/BANDAGES/DRESSINGS) ×2 IMPLANT
SUT VIC AB 1 CT1 18XBRD ANBCTR (SUTURE) ×2 IMPLANT
SUT VIC AB 1 CT1 8-18 (SUTURE) ×6
SUT VIC AB 2-0 CP2 18 (SUTURE) ×6 IMPLANT
SYR 20ML LL LF (SYRINGE) IMPLANT
TOWEL GREEN STERILE (TOWEL DISPOSABLE) ×3 IMPLANT
TOWEL GREEN STERILE FF (TOWEL DISPOSABLE) ×3 IMPLANT
TRAY FOLEY MTR SLVR 16FR STAT (SET/KITS/TRAYS/PACK) ×3 IMPLANT
WATER STERILE IRR 1000ML POUR (IV SOLUTION) ×3 IMPLANT

## 2020-02-06 NOTE — Anesthesia Postprocedure Evaluation (Signed)
Anesthesia Post Note  Patient: Patrick Mathews  Procedure(s) Performed: POSTERIOR LUMBAR INTERBODY FUSION, LAMINETOMY, POSTERIOR INSTRUMENTATION LUMBAR TWO- LUMBAR THREE; EXPLORE [place] (N/A )     Patient location during evaluation: PACU Anesthesia Type: General Level of consciousness: awake and alert, oriented and patient cooperative Pain management: pain level controlled Vital Signs Assessment: post-procedure vital signs reviewed and stable Respiratory status: spontaneous breathing, nonlabored ventilation and respiratory function stable Cardiovascular status: blood pressure returned to baseline and stable Postop Assessment: no apparent nausea or vomiting Anesthetic complications: no   No complications documented.  Last Vitals:  Vitals:   02/06/20 1745 02/06/20 1823  BP: 121/76 (!) 143/89  Pulse: 83 82  Resp: 18 18  Temp:  36.7 C  SpO2: 96% 99%    Last Pain:  Vitals:   02/06/20 1825  TempSrc:   PainSc: 0-No pain                 Pervis Hocking

## 2020-02-06 NOTE — H&P (Signed)
Subjective: The patient is a 69 year old white male who has had several prior back surgeries.  He has complained of increasing back and leg pain.  He has failed medical management.  He was worked up with a lumbar MRI which demonstrated a spondylolisthesis and stenosis at L2-3.  I discussed the various treatment options with him.  He has decided to proceed with surgery.  Past Medical History:  Diagnosis Date  . Adjacent segment disease with spinal stenosis   . Arthritis    "knees; back" (07/14/2014)  . Asthma   . CAD (coronary artery disease)   . Chest pain   . Exertional dyspnea   . GERD (gastroesophageal reflux disease)   . Hyperlipidemia   . Hypertension   . OSA on CPAP    no cpap  . Pneumonia 11/2013  . Primary localized osteoarthritis of left knee   . Seasonal allergies     Past Surgical History:  Procedure Laterality Date  . ANTERIOR CERVICAL DECOMP/DISCECTOMY FUSION  2009   Dr Joya Salm  . BACK SURGERY    . BUNIONECTOMY Right  2013  . COLONOSCOPY N/A 11/06/2012   Procedure: COLONOSCOPY;  Surgeon: Jamesetta So, MD;  Location: [facility];  Service: Gastroenterology;  Laterality: N/A;  . JOINT REPLACEMENT    . KNEE ARTHROSCOPY Bilateral 1990's  . [place]; 2009   Dr Joya Salm  . TONSILLECTOMY  1950's  . TOTAL KNEE ARTHROPLASTY Left 07/14/2014  . TOTAL KNEE ARTHROPLASTY Left 07/14/2014   Procedure: LEFT TOTAL KNEE ARTHROPLASTY;  Surgeon: [name], MD;  Location: [facility];  Service: Orthopedics;  Laterality: Left;    Allergies  Allergen Reactions  . Morphine And Related Nausea And Vomiting  . Sulfa Antibiotics Hives    Social History   Tobacco Use  . Smoking status: Current Some Day Smoker    Years: 30.00    Types: Cigars  . Smokeless tobacco: Never Used  . Tobacco comment: 07/14/2014 smoked cigs on and off x 40 yrs- none since 2014, but does smoke occ cigar-  Substance Use Topics  . Alcohol use: Yes    Alcohol/week: 0.0 standard drinks    Comment:  occ    Family History  Problem Relation Age of Onset  . CAD Mother   . CVA Mother   . Alzheimer's disease Mother   . Dementia Father   . Alzheimer's disease Father   . Stroke Maternal Grandmother   . Heart attack Maternal Grandfather   . CAD Maternal Grandfather   . Stroke Paternal Grandmother   . Colon cancer Neg Hx   . Colon polyps Neg Hx    Prior to Admission medications   Medication Sig Start Date End Date Taking? Authorizing Provider  aspirin EC 81 MG tablet Take 1 tablet (81 mg total) by mouth daily. 07/11/19  Yes Nahser, Wonda Cheng, MD  Cholecalciferol (VITAMIN D3) 125 MCG (5000 UT) TBDP Take 5,000 Units by mouth daily.   Yes [provider]  gabapentin (NEURONTIN) 300 MG capsule Take 300 mg by mouth daily as needed (pain).  05/24/19  Yes [provider]  gabapentin (NEURONTIN) 600 MG tablet Take 600 mg by mouth at bedtime.   Yes [provider]  nitroGLYCERIN (NITROSTAT) 0.4 MG SL tablet Place 1 tablet (0.4 mg total) under the tongue every 5 (five) minutes as needed for chest pain. 07/11/19  Yes Nahser, Wonda Cheng, MD  omeprazole (PRILOSEC) 20 MG capsule Take 20 mg by mouth daily before breakfast.  Yes [provider]  rosuvastatin (CRESTOR) 10 MG tablet Take 10 mg by mouth daily.   Yes [provider]  tadalafil (CIALIS) 5 MG tablet Take 5 mg by mouth daily as needed for erectile dysfunction.  06/24/19  Yes [provider]  Multiple Vitamin (MULTIVITAMIN ADULT PO) Take 1 tablet by mouth. 1-2 tablets a day Patient not taking: Reported on 01/27/2020    [provider]  sildenafil (REVATIO) 20 MG tablet Take 40-100 mg by mouth daily as needed (erectile dysfunction). Patient not taking: Reported on 01/27/2020    [provider]     Review of Systems  Positive ROS: As above  All other systems have been reviewed and were otherwise negative with the exception of those mentioned in the HPI and as  above.  Objective: Vital signs in last 24 hours: Temp:  [98.3 F (36.8 C)] 98.3 F (36.8 C) (08/26 0923) Pulse Rate:  [60] 60 (08/26 0923) Resp:  [17] 17 (08/26 0923) BP: (135)/(81) 135/81 (08/26 0923) SpO2:  [97 %] 97 % (08/26 0923) Weight:  [96.2 kg] 96.2 kg (08/26 0923) Estimated body mass index is 30.42 kg/m as calculated from the following:   Height as of this encounter: 5\' 10"  (1.778 m).   Weight as of this encounter: 96.2 kg.   General Appearance: Alert Head: Normocephalic, without obvious abnormality, atraumatic Eyes: PERRL, conjunctiva/corneas clear, EOM's intact,    Ears: Normal  Throat: Normal  Neck: Supple, Back: The patient's lumbar incision is well-healed. Lungs: Clear to auscultation bilaterally, respirations unlabored Heart: Regular rate and rhythm, no murmur, rub or gallop Abdomen: Soft, non-tender Extremities: Extremities normal, atraumatic, no cyanosis or edema Skin: unremarkable  NEUROLOGIC:   Mental status: alert and oriented,Motor Exam - grossly normal Sensory Exam - grossly normal Reflexes:  Coordination - grossly normal Gait - grossly normal Balance - grossly normal Cranial Nerves: I: smell Not tested  II: visual acuity  OS: Normal  OD: Normal   II: visual fields Full to confrontation  II: pupils Equal, round, reactive to light  III,VII: ptosis None  III,IV,VI: extraocular muscles  Full ROM  V: mastication Normal  V: facial light touch sensation  Normal  V,VII: corneal reflex  Present  VII: facial muscle function - upper  Normal  VII: facial muscle function - lower Normal  VIII: hearing Not tested  IX: soft palate elevation  Normal  IX,X: gag reflex Present  XI: trapezius strength  5/5  XI: sternocleidomastoid strength 5/5  XI: neck flexion strength  5/5  XII: tongue strength  Normal    Data Review Lab Results  Component Value Date   WBC 4.5 02/04/2020   HGB 15.4 02/04/2020   HCT 45.5 02/04/2020   MCV 92.5 02/04/2020   PLT 175  02/04/2020   Lab Results  Component Value Date   NA 141 02/04/2020   K 4.1 02/04/2020   CL 107 02/04/2020   CO2 26 02/04/2020   BUN 16 02/04/2020   CREATININE 1.23 02/04/2020   GLUCOSE 111 (H) 02/04/2020   Lab Results  Component Value Date   INR 0.93 07/03/2014    Assessment/Plan: L2-3 spinal stenosis, lumbago, lumbar radiculopathy, neurogenic claudication, malplaced pedicle screw, I have discussed the situation with the patient.  I have reviewed his imaging studies and pointed out the abnormalities.  We have discussed the various treatment options including surgery.  I have described the surgical treatment option of an exploration of his lumbar fusion, removal of his left L4 pedicle screw  and extending his fusion and instrumentation to L2-3.  I have described the surgery to him.  I have shown him surgical models.  I have given him a surgical pamphlet.  We have discussed the risks, benefits, alternatives, expected postoperative course, and likelihood of achieving our goals with surgery.  I have answered all his questions.  He has decided to proceed with surgery.   Ophelia Charter 02/06/2020 11:06 AM

## 2020-02-06 NOTE — Op Note (Signed)
Brief history: The patient is a 69 year old white male who has had several prior spine surgeries.  His most recent was an extension of his lumbar fusion at L3-4.  He did well for several years but has developed recurrent back and leg pain.  He has failed medical management.  He was worked up with lumbar x-rays and lumbar MRI which demonstrated lumbar adjacent disease with spondylolisthesis and spinal stenosis.  I discussed the various treatment options with him.  He has decided to proceed with surgery.  Preoperative diagnosis: L2-3 lumbar adjacent disease with spondylolisthesis, degenerative disc disease, spinal stenosis compressing both the L2 and the L3 nerve roots; lumbago; lumbar radiculopathy; neurogenic claudication; malplaced pedicle screw  Postoperative diagnosis: As above  Procedure: Bilateral L2-3 laminotomy/foraminotomies/medial facetectomy to decompress the bilateral L2 and L3 nerve roots(the work required to do this was in addition to the work required to do the posterior lumbar interbody fusion because of the patient's spinal stenosis, facet arthropathy. Etc. requiring a wide decompression of the nerve roots.);  L2-3 transforaminal lumbar interbody fusion with local morselized autograft bone and Zimmer DBM; insertion of interbody prosthesis at L2-3 (globus peek expandable interbody prosthesis); posterior segmental instrumentation from L2 to L5 with globus titanium pedicle screws and rods; posterior lateral arthrodesis at L2-3 with local morselized autograft bone and Zimmer DBM; exploration of lumbar fusion/removal of left L4 pedicle screw.  Surgeon: Dr. Earle Gell  Asst.: Dr. Pieter Partridge Dawley  Anesthesia: Gen. endotracheal  Estimated blood loss: 200 cc  Drains: None  Complications: None  Description of procedure: The patient was brought to the operating room by the anesthesia team. General endotracheal anesthesia was induced. The patient was turned to the prone position on the Wilson  frame. The patient's lumbosacral region was then prepared with Betadine scrub and Betadine solution. Sterile drapes were applied.  I then injected the area to be incised with Marcaine with epinephrine solution. I then used the scalpel to make a linear midline incision over the L2-3, L3-4 and L4-5 interspace, incising through the old surgical scar. I then used electrocautery to perform a bilateral subperiosteal dissection exposing the spinous process and lamina of L2, L3 and exposing the old hardware at L3-4 and L4-5. We then inserted the Verstrac retractor to provide exposure.  We explored the fusion by removing the caps and the rods from the old screws.  The arthrodesis at L3-4 and L4-5 appeared solid.  I then remove the left L4 pedicle screw which was medially placed at the previous operation.  I began the decompression by using the high speed drill to perform laminotomies at L2-3 bilaterally. We then used the Kerrison punches to widen the laminotomy and removed the ligamentum flavum at L2-3 bilaterally. We used the Kerrison punches to remove the medial facets at L2-3 bilaterally, we removed the left L2-3 facet. We performed wide foraminotomies about the bilateral L2 and L3 nerve roots completing the decompression.  We now turned our attention to the posterior lumbar interbody fusion. I used a scalpel to incise the intervertebral disc at L2-3 bilaterally. I then performed a partial intervertebral discectomy at L2-3 bilaterally using the pituitary forceps. We prepared the vertebral endplates at O2-4 bilaterally for the fusion by removing the soft tissues with the curettes. We then used the trial spacers to pick the appropriate sized interbody prosthesis. We prefilled his prosthesis with a combination of local morselized autograft bone that we obtained during the decompression as well as Zimmer DBM. We inserted the prefilled prosthesis into the interspace  at L2-3 from the left, we then turned and expanded  the prosthesis. There was a good snug fit of the prosthesis in the interspace. We then filled and the remainder of the intervertebral disc space with local morselized autograft bone and Zimmer DBM. This completed the posterior lumbar interbody arthrodesis.  During the decompression and insertion of the prosthesis the assistant protected the thecal sac and nerve roots with the D'Errico retractor.  We now turned attention to the instrumentation. Under fluoroscopic guidance we cannulated the bilateral L2 pedicles with the bone probe. We then removed the bone probe. We then tapped the pedicle with a 6.5 millimeter tap. We then removed the tap. We probed inside the tapped pedicle with a ball probe to rule out cortical breaches. We then inserted a 7.5 x 55 millimeter pedicle screw into the L2 pedicles bilaterally under fluoroscopic guidance. We then palpated along the medial aspect of the pedicles to rule out cortical breaches. There were none. The nerve roots were not injured. We then connected the unilateral pedicle screws from L2 and L5 with a lordotic rod. We compressed the construct and secured the rod in place with the caps. We then tightened the caps appropriately. This completed the instrumentation from 2 to L5 bilaterally.  We now turned our attention to the posterior lateral arthrodesis at L2-3 bilaterally. We used the high-speed drill to decorticate the remainder of the facets, pars, transverse process at L2-3 bilaterally. We then applied a combination of local morselized autograft bone and Zimmer DBM over these decorticated posterior lateral structures. This completed the posterior lateral arthrodesis.  We then obtained hemostasis using bipolar electrocautery. We irrigated the wound out with bacitracin solution. We inspected the thecal sac and nerve roots and noted they were well decompressed. We then removed the retractor.  We injected Exparel . We reapproximated patient's thoracolumbar fascia with  interrupted #1 Vicryl suture. We reapproximated patient's subcutaneous tissue with interrupted 2-0 Vicryl suture. The reapproximated patient's skin with Steri-Strips and benzoin. The wound was then coated with bacitracin ointment. A sterile dressing was applied. The drapes were removed. The patient was subsequently returned to the supine position where they were extubated by the anesthesia team. He was then transported to the post anesthesia care unit in stable condition. All sponge instrument and needle counts were reportedly correct at the end of this case.

## 2020-02-06 NOTE — Anesthesia Procedure Notes (Signed)
Procedure Name: Intubation Date/Time: 02/06/2020 12:42 PM Performed by: Kathryne Hitch, CRNA Pre-anesthesia Checklist: Patient identified, Emergency Drugs available, Suction available and Patient being monitored Patient Re-evaluated:Patient Re-evaluated prior to induction Oxygen Delivery Method: Circle system utilized Preoxygenation: Pre-oxygenation with 100% oxygen Induction Type: IV induction Ventilation: Mask ventilation without difficulty Laryngoscope Size: Mac and 4 Grade View: Grade II Tube type: Oral Tube size: 7.5 mm Number of attempts: 1 Airway Equipment and Method: Stylet and Oral airway Placement Confirmation: ETT inserted through vocal cords under direct vision,  positive ETCO2 and breath sounds checked- equal and bilateral Secured at: 23 cm Tube secured with: Tape Dental Injury: Teeth and Oropharynx as per pre-operative assessment

## 2020-02-06 NOTE — Transfer of Care (Signed)
Immediate Anesthesia Transfer of Care Note  Patient: Patrick Mathews  Procedure(s) Performed: POSTERIOR LUMBAR INTERBODY FUSION, LAMINETOMY, POSTERIOR INSTRUMENTATION LUMBAR TWO- LUMBAR THREE; EXPLORE [place] (N/A )  Patient Location: PACU  Anesthesia Type:General  Level of Consciousness: drowsy, patient cooperative and responds to stimulation  Airway & Oxygen Therapy: Patient Spontanous Breathing and Patient connected to face mask oxygen  Post-op Assessment: Report given to RN and Post -op Vital signs reviewed and stable  Post vital signs: Reviewed and stable  Last Vitals:  Vitals Value Taken Time  BP    Temp    Pulse    Resp    SpO2      Last Pain:  Vitals:   02/06/20 1615  TempSrc:   PainSc: (P) Asleep      Patients Stated Pain Goal: 2 (47/07/61 5183)  Complications: No complications documented.

## 2020-02-07 DIAGNOSIS — M48062 Spinal stenosis, lumbar region with neurogenic claudication: Secondary | ICD-10-CM | POA: Diagnosis not present

## 2020-02-07 DIAGNOSIS — M4316 Spondylolisthesis, lumbar region: Secondary | ICD-10-CM | POA: Diagnosis not present

## 2020-02-07 DIAGNOSIS — I10 Essential (primary) hypertension: Secondary | ICD-10-CM | POA: Diagnosis not present

## 2020-02-07 DIAGNOSIS — I2510 Atherosclerotic heart disease of native coronary artery without angina pectoris: Secondary | ICD-10-CM | POA: Diagnosis not present

## 2020-02-07 DIAGNOSIS — J449 Chronic obstructive pulmonary disease, unspecified: Secondary | ICD-10-CM | POA: Diagnosis not present

## 2020-02-07 DIAGNOSIS — E785 Hyperlipidemia, unspecified: Secondary | ICD-10-CM | POA: Diagnosis not present

## 2020-02-07 LAB — BASIC METABOLIC PANEL
Anion gap: 9 (ref 5–15)
BUN: 15 mg/dL (ref 8–23)
CO2: 25 mmol/L (ref 22–32)
Calcium: 8.7 mg/dL — ABNORMAL LOW (ref 8.9–10.3)
Chloride: 106 mmol/L (ref 98–111)
Creatinine, Ser: 1.23 mg/dL (ref 0.61–1.24)
GFR calc Af Amer: >60 mL/min (ref >60)
GFR calc non Af Amer: 60 mL/min — ABNORMAL LOW (ref >60)
Glucose, Bld: 141 mg/dL — ABNORMAL HIGH (ref 70–99)
Potassium: 4.4 mmol/L (ref 3.5–5.1)
Sodium: 140 mmol/L (ref 135–145)

## 2020-02-07 LAB — CBC
HCT: 39.5 % (ref 39.0–52.0)
Hemoglobin: 13.6 g/dL (ref 13.0–17.0)
MCH: 32 pg (ref 26.0–34.0)
MCHC: 34.4 g/dL (ref 30.0–36.0)
MCV: 92.9 fL (ref 80.0–100.0)
Platelets: 162 10*3/uL (ref 150–400)
RBC: 4.25 MIL/uL (ref 4.22–5.81)
RDW: 13.1 % (ref 11.5–15.5)
WBC: 11.5 10*3/uL — ABNORMAL HIGH (ref 4.0–10.5)
nRBC: 0 % (ref 0.0–0.2)

## 2020-02-07 MED ORDER — HYDROCODONE-ACETAMINOPHEN 10-325 MG PO TABS: 1.0000 | ORAL_TABLET | ORAL | Status: DC | PRN

## 2020-02-07 MED ORDER — HYDROCODONE-ACETAMINOPHEN 10-325 MG PO TABS
1.0000 | ORAL_TABLET | ORAL | 0 refills | Status: DC | PRN
Start: 2020-02-07 — End: 2020-05-12

## 2020-02-07 MED ORDER — CYCLOBENZAPRINE HCL 10 MG PO TABS: 10.0000 mg | ORAL_TABLET | Freq: Three times a day (TID) | ORAL | 0 refills | Status: DC | PRN

## 2020-02-07 MED ORDER — DOCUSATE SODIUM 100 MG PO CAPS: 100.0000 mg | ORAL_CAPSULE | Freq: Two times a day (BID) | ORAL | 0 refills | Status: DC

## 2020-02-07 NOTE — Evaluation (Signed)
Occupational Therapy Evaluation and Discharge Patient Details Name: Patrick Mathews MRN: [id number] DOB: Jan 20, 1951 Today's Date: 02/07/2020    History of Present Illness Pt is a 69 yo male now s/p exploration of the patient's lumbar fusion, removal of left 4 pedicle screw and extension of fusion to L2-3 on 02/06/2020   Clinical Impression   This 69 yo male admitted and underwent above presents to acute OT with all education completed, we will sign off from OT.    Follow Up Recommendations  No OT follow up;Supervision - Intermittent    Equipment Recommendations  None recommended by OT       Precautions / Restrictions Precautions Precautions: Back Precaution Booklet Issued: Yes (comment) Precaution Comments: Pt able to state back precautions Restrictions Weight Bearing Restrictions: No      Mobility Bed Mobility               General bed mobility comments: Pt up in recliner upon my arrival  Transfers Overall transfer level: Needs assistance Equipment used: None Transfers: Sit to/from Stand Sit to Stand: Modified independent (Device/Increase time)         General transfer comment: Educated on sit<>stand with legs at shoulder width and feet turned out--this way he can keep his back more straight    Balance Overall balance assessment: No apparent balance deficits (not formally assessed)                                         ADL either performed or assessed with clinical judgement   ADL Overall ADL's : Needs assistance/impaired                                       General ADL Comments: Pt can cross his legs to get to his feet, can doff his socks, but needs A to donn. Wife can A prn per pt. Pt is aware to put on underwear and pants before socks. Educated on use of wet wipes for back peri care, use of 2 cups for brushing his teeth to avoid bending over sink, not sitting for more than 20-30 minutes for now and work his way up to  an hour at a time.     Vision Patient Visual Report: No change from baseline              Pertinent Vitals/Pain Pain Assessment: 0-10 Pain Score: 2  Pain Location: incisional Pain Descriptors / Indicators: Sore Pain Intervention(s): Limited activity within patient's tolerance;Monitored during session     Hand Dominance Right   Extremity/Trunk Assessment Upper Extremity Assessment Upper Extremity Assessment: Overall WFL for tasks assessed           Communication Communication Communication: No difficulties   Cognition Arousal/Alertness: Awake/alert Behavior During Therapy: WFL for tasks assessed/performed Overall Cognitive Status: Within Functional Limits for tasks assessed                                                Home Living Family/patient expects to be discharged to:: Private residence Living Arrangements: Spouse/significant other Available Help at Discharge: Family;Available 24 hours/day Type of Home: House  Bathroom Shower/Tub: Occupational psychologist: Standard     Home Equipment: Shower seat - built in          Prior Functioning/Environment Level of Independence: Independent                 OT Problem List: Decreased range of motion;Impaired balance (sitting and/or standing);Pain         OT Goals(Current goals can be found in the care plan section) Acute Rehab OT Goals Patient Stated Goal: to go home today                AM-PAC OT "6 Clicks" Daily Activity     Outcome Measure Help from another person eating meals?: None Help from another person taking care of personal grooming?: None Help from another person toileting, which includes using toliet, bedpan, or urinal?: None Help from another person bathing (including washing, rinsing, drying)?: A Little Help from another person to put on and taking off regular upper body clothing?: None Help from another person to put on and taking off  regular lower body clothing?: A Little 6 Click Score: 22   End of Session Equipment Utilized During Treatment: Back brace Nurse Communication:  (No OT needs written on sheet at nursing desk)  Activity Tolerance: Patient tolerated treatment well Patient left: in chair;with call bell/phone within reach  OT Visit Diagnosis: Other abnormalities of gait and mobility (R26.89);Muscle weakness (generalized) (M62.81);Pain Pain - part of body:  (back incisional)                Time: 1941-7408 OT Time Calculation (min): 15 min Charges:  OT General Charges $OT Visit: 1 Visit OT Evaluation $OT Eval Moderate Complexity: 1 Mod  Golden Circle, OTR/L Acute [employer] Pager [phone] Office [phone]     Almon Register 02/07/2020, 10:22 AM

## 2020-02-07 NOTE — Progress Notes (Signed)
Patient alert and oriented, mae's well, voiding adequate amount of urine, swallowing without difficulty, no c/o pain at time of discharge. Patient discharged home with family. Script and discharged instructions given to patient. Patient and family stated understanding of instructions given. Patient has an appointment with Dr. Jenkins   

## 2020-02-07 NOTE — Evaluation (Signed)
Physical Therapy Evaluation and Discharge Patient Details Name: Patrick Mathews MRN: [id number] DOB: 06/01/1951 Today's Date: 02/07/2020   History of Present Illness  Pt is a 69 yo male now s/p exploration of the patient's lumbar fusion, removal of left 4 pedicle screw and extension of fusion to L2-3 on 02/06/2020  Clinical Impression  Patient evaluated by Physical Therapy with no further acute PT needs identified. All education has been completed and the patient has no further questions. Pt was able to demonstrate transfers and ambulation with gross modified independence without an AD. Pt was educated on precautions, brace application/wearing schedule, appropriate activity progression, and car transfer. See below for any follow-up Physical Therapy or equipment needs. PT is signing off. Thank you for this referral.     Follow Up Recommendations No PT follow up;Supervision - Intermittent    Equipment Recommendations  None recommended by PT    Recommendations for Other Services       Precautions / Restrictions Precautions Precautions: Back Precaution Booklet Issued: Yes (comment) Precaution Comments: Reviewed throughout functional mobility Restrictions Weight Bearing Restrictions: No      Mobility  Bed Mobility Overal bed mobility: Needs Assistance Bed Mobility: Rolling;Sidelying to Sit Rolling: Modified independent (Device/Increase time) Sidelying to sit: Supervision       General bed mobility comments: VC's for proper log roll technique.   Transfers Overall transfer level: Modified independent Equipment used: None Transfers: Sit to/from Stand Sit to Stand: Modified independent (Device/Increase time)         General transfer comment: No assist required to power-up to full stand. VC's for wide BOS  Ambulation/Gait Ambulation/Gait assistance: Supervision;Modified independent (Device/Increase time) Gait Distance (Feet): 400 Feet Assistive device: None Gait  Pattern/deviations: Step-through pattern;Decreased stride length Gait velocity: Decreased Gait velocity interpretation: <1.8 ft/sec, indicate of risk for recurrent falls General Gait Details: VC's for improved posture. Pt ambulating slow but generally steady without AD. Progressed to a mod I level by end of gait training.   Stairs Stairs: Yes Stairs assistance: Supervision Stair Management: One rail Right;Alternating pattern;Forwards Number of Stairs: 10 General stair comments: VC's for sequencing and general safety.   Wheelchair Mobility    Modified Rankin (Stroke Patients Only)       Balance Overall balance assessment: No apparent balance deficits (not formally assessed)                                           Pertinent Vitals/Pain Pain Assessment: Faces Pain Score: 2  Faces Pain Scale: Hurts little more Pain Location: incisional Pain Descriptors / Indicators: Sore;Operative site guarding Pain Intervention(s): Limited activity within patient's tolerance;Monitored during session;Repositioned    Home Living Family/patient expects to be discharged to:: Private residence Living Arrangements: Spouse/significant other Available Help at Discharge: Family;Available 24 hours/day Type of Home: House Home Access: Stairs to enter Entrance Stairs-Rails: None Entrance Stairs-Number of Steps: 2 Home Layout: Multi-level Home Equipment: Shower seat - built in (corner seat)      Prior Function Level of Independence: Independent               Hand Dominance   Dominant Hand: Right    Extremity/Trunk Assessment   Upper Extremity Assessment Upper Extremity Assessment: Defer to OT evaluation    Lower Extremity Assessment Lower Extremity Assessment: Generalized weakness (Consistent with pre-op diagnosis)    Cervical / Trunk Assessment Cervical / Trunk Assessment: Other  exceptions Cervical / Trunk Exceptions: s/p surgery  Communication    Communication: No difficulties  Cognition Arousal/Alertness: Awake/alert Behavior During Therapy: WFL for tasks assessed/performed Overall Cognitive Status: Within Functional Limits for tasks assessed                                        General Comments      Exercises     Assessment/Plan    PT Assessment Patent does not need any further PT services  PT Problem List         PT Treatment Interventions      PT Goals (Current goals can be found in the Care Plan section)  Acute Rehab PT Goals Patient Stated Goal: to go home today PT Goal Formulation: All assessment and education complete, DC therapy    Frequency     Barriers to discharge        Co-evaluation               AM-PAC PT "6 Clicks" Mobility  Outcome Measure Help needed turning from your back to your side while in a flat bed without using bedrails?: None Help needed moving from lying on your back to sitting on the side of a flat bed without using bedrails?: None Help needed moving to and from a bed to a chair (including a wheelchair)?: None Help needed standing up from a chair using your arms (e.g., wheelchair or bedside chair)?: None Help needed to walk in hospital room?: None Help needed climbing 3-5 steps with a railing? : None 6 Click Score: 24    End of Session Equipment Utilized During Treatment: Gait belt;Back brace Activity Tolerance: Patient tolerated treatment well Patient left: in chair;with call bell/phone within reach Nurse Communication: Mobility status PT Visit Diagnosis: Unsteadiness on feet (R26.81);Pain Pain - part of body:  (back)    Time: 6767-2094 PT Time Calculation (min) (ACUTE ONLY): 20 min   Charges:   PT Evaluation $PT Eval Low Complexity: 1 Low          Rolinda Roan, PT, DPT Acute Rehabilitation Services Pager: [phone] Office: [phone]   Thelma Comp 02/07/2020, 1:57 PM

## 2020-02-07 NOTE — Care Management CC44 (Addendum)
Condition Code 44 Documentation Completed  Patient Details  Name: Patrick Mathews MRN: [id number] Date of Birth: 07-Nov-1950   Condition Code 44 given:    Patient signature on Condition Code 44 notice:    Documentation of 2 MD's agreement:    Code 44 added to claim:    Observation notice printed and given to patient   Verdell Carmine, RN 02/07/2020, 8:54 AM

## 2020-02-07 NOTE — TOC Initial Note (Signed)
Transition of Care [facility]) - Initial/Assessment Note    Patient Details  Name: Patrick Mathews MRN: [id number] Date of Birth: September 01, 1950  [facility] of [facility]) CM/SW Contact:    Verdell Carmine, RN Phone Number: 02/07/2020, 8:58 AM  Clinical Narrative:                   Expected Discharge Plan: Home/Self Care Barriers to Discharge: No Barriers Identified   Patient Goals and CMS Choice  patient denies any needs for home code 44 delivered to patient, states understanding.       Expected Discharge Plan and Services Expected Discharge Plan: Home/Self Care       Living arrangements for the past 2 months: Single Family Home Expected Discharge Date: 02/07/20                                    Prior Living Arrangements/Services Living arrangements for the past 2 months: Single Family Home            Need for Family Participation in Patient Care: Yes (Comment) Care giver support system in place?: Yes (comment)   Criminal Activity/Legal Involvement Pertinent to Current Situation/Hospitalization: No - Comment as needed  Activities of Daily Living      Permission Sought/Granted                  Emotional Assessment Appearance:: Appears stated age Attitude/Demeanor/Rapport: Gracious Affect (typically observed): Pleasant Orientation: : Oriented to Place, Oriented to  Time, Oriented to Situation, Oriented to Self Alcohol / Substance Use: Not Applicable Psych Involvement: No (comment)  Admission diagnosis:  Lumbar adjacent segment disease with spondylolisthesis [M51.36, M43.16] Patient Active Problem List   Diagnosis Date Noted  . Lumbar adjacent segment disease with spondylolisthesis 02/06/2020  . Chest pressure 09/27/2019  . 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  . Morbid obesity ([place]) 05/08/2015  . DJD (degenerative joint disease) of knee 07/14/2014  . Acute upper respiratory infection 06/12/2014  . Cigarette  smoker 06/12/2014  . Cough 06/12/2014  . Primary localized osteoarthritis of left knee   . Hyperlipidemia   . GERD (gastroesophageal reflux disease)   . Back pain   . Dyspnea 05/07/2014  . Multiple pulmonary nodules 05/07/2014   PCP:  Sharilyn Sites, MD Pharmacy:   [facility], [place] [address] Phone: [phone] Fax: [phone]  [place], [place] [address] Phone: [phone] Fax: [phone]     Social Determinants of Health (SDOH) Interventions    Readmission Risk Interventions No flowsheet data found.

## 2020-02-07 NOTE — Care Management Obs Status (Signed)
[facility] NOTIFICATION   Patient Details  Name: Patrick Mathews MRN: [id number] Date of Birth: 12-03-1950   Medicare Observation Status Notification Given:       Verdell Carmine, RN 02/07/2020, 8:53 AM

## 2020-02-07 NOTE — Discharge Summary (Signed)
Physician Discharge Summary  Patient ID: SABIN GIBEAULT MRN: [id number] DOB/AGE: [birth year] 69 y.o.  Admit date: 02/06/2020 Discharge date: 02/07/2020  Admission Diagnoses: Lumbar adjacent disease with spondylolisthesis, lumbar spinal stenosis with neurogenic claudication, lumbago, lumbar radiculopathy Discharge Diagnoses: The same Active Problems:   Lumbar adjacent segment disease with spondylolisthesis   Discharged Condition: good  Hospital Course: I performed an exploration of the patient's lumbar fusion, removal of left 4 pedicle screw and extension of fusion to L2-3 on 02/06/2020.  The surgery went well.  The patient's postoperative course was unremarkable.  On postoperative day #1 he did requested discharge home.  He was given written and oral discharge instructions.  All his questions were answered.  Consults: PT, OT, care management Significant Diagnostic Studies: None Treatments: Exploration of lumbar fusion, removal of left L4 pedicle screw, extension of his fusion to L2-3 Discharge Exam: Blood pressure 129/70, pulse 64, temperature 98.1 F (36.7 C), temperature source Oral, resp. rate 20, height 5\' 10"  (1.778 m), weight 96.2 kg, SpO2 100 %. The patient is alert and pleasant.  He looks well.  His strength is normal.  Disposition: Home  Discharge Instructions    Call MD for:  difficulty breathing, headache or visual disturbances   Complete by: As directed    Call MD for:  extreme fatigue   Complete by: As directed    Call MD for:  hives   Complete by: As directed    Call MD for:  persistant dizziness or light-headedness   Complete by: As directed    Call MD for:  persistant nausea and vomiting   Complete by: As directed    Call MD for:  redness, tenderness, or signs of infection (pain, swelling, redness, odor or green/yellow discharge around incision site)   Complete by: As directed    Call MD for:  severe uncontrolled pain   Complete by: As directed    Call MD  for:  temperature >100.4   Complete by: As directed    Diet - low sodium heart healthy   Complete by: As directed    Discharge instructions   Complete by: As directed    Call [phone] for a followup appointment. Take a stool softener while you are using pain medications.   Driving Restrictions   Complete by: As directed    Do not drive for 2 weeks.   Increase activity slowly   Complete by: As directed    Lifting restrictions   Complete by: As directed    Do not lift more than 5 pounds. No excessive bending or twisting.   May shower / Bathe   Complete by: As directed    Remove the dressing for 3 days after surgery.  You may shower, but leave the incision alone.   Remove dressing in 48 hours   Complete by: As directed      Allergies as of 02/07/2020      Reactions   Morphine And Related Nausea And Vomiting   Sulfa Antibiotics Hives      Medication List    TAKE these medications   aspirin EC 81 MG tablet Take 1 tablet (81 mg total) by mouth daily.   cyclobenzaprine 10 MG tablet Commonly known as: FLEXERIL Take 1 tablet (10 mg total) by mouth 3 (three) times daily as needed for muscle spasms.   docusate sodium 100 MG capsule Commonly known as: COLACE Take 1 capsule (100 mg total) by mouth 2 (two) times daily.   gabapentin 600 MG tablet  Commonly known as: NEURONTIN Take 600 mg by mouth at bedtime.   gabapentin 300 MG capsule Commonly known as: NEURONTIN Take 300 mg by mouth daily as needed (pain).   HYDROcodone-acetaminophen 10-325 MG tablet Commonly known as: NORCO Take 1 tablet by mouth every 4 (four) hours as needed for moderate pain.   nitroGLYCERIN 0.4 MG SL tablet Commonly known as: NITROSTAT Place 1 tablet (0.4 mg total) under the tongue every 5 (five) minutes as needed for chest pain.   omeprazole 20 MG capsule Commonly known as: PRILOSEC Take 20 mg by mouth daily before breakfast.   rosuvastatin 10 MG tablet Commonly known as: CRESTOR Take 10 mg  by mouth daily.   tadalafil 5 MG tablet Commonly known as: CIALIS Take 5 mg by mouth daily as needed for erectile dysfunction.   Vitamin D3 125 MCG (5000 UT) Tbdp Take 5,000 Units by mouth daily.        Signed: Ophelia Charter 02/07/2020, 6:43 AM

## 2020-02-10 MED FILL — Heparin Sodium (Porcine) Inj 1000 Unit/ML: INTRAMUSCULAR | Qty: 30 | Status: AC

## 2020-02-10 MED FILL — Sodium Chloride IV Soln 0.9%: INTRAVENOUS | Qty: 1000 | Status: AC

## 2020-02-11 DIAGNOSIS — E7849 Other hyperlipidemia: Secondary | ICD-10-CM | POA: Diagnosis not present

## 2020-02-11 DIAGNOSIS — M5127 Other intervertebral disc displacement, lumbosacral region: Secondary | ICD-10-CM | POA: Diagnosis not present

## 2020-02-25 DIAGNOSIS — R030 Elevated blood-pressure reading, without diagnosis of hypertension: Secondary | ICD-10-CM | POA: Diagnosis not present

## 2020-02-25 DIAGNOSIS — M48062 Spinal stenosis, lumbar region with neurogenic claudication: Secondary | ICD-10-CM | POA: Diagnosis not present

## 2020-02-25 DIAGNOSIS — Z981 Arthrodesis status: Secondary | ICD-10-CM | POA: Diagnosis not present

## 2020-02-25 DIAGNOSIS — Z6830 Body mass index (BMI) 30.0-30.9, adult: Secondary | ICD-10-CM | POA: Diagnosis not present

## 2020-03-12 DIAGNOSIS — E7849 Other hyperlipidemia: Secondary | ICD-10-CM | POA: Diagnosis not present

## 2020-03-12 DIAGNOSIS — M5127 Other intervertebral disc displacement, lumbosacral region: Secondary | ICD-10-CM | POA: Diagnosis not present

## 2020-04-07 DIAGNOSIS — Z96652 Presence of left artificial knee joint: Secondary | ICD-10-CM | POA: Diagnosis not present

## 2020-04-07 DIAGNOSIS — M1711 Unilateral primary osteoarthritis, right knee: Secondary | ICD-10-CM | POA: Diagnosis not present

## 2020-04-21 DIAGNOSIS — Z01812 Encounter for preprocedural laboratory examination: Secondary | ICD-10-CM | POA: Diagnosis not present

## 2020-04-21 DIAGNOSIS — G4739 Other sleep apnea: Secondary | ICD-10-CM | POA: Diagnosis not present

## 2020-04-21 DIAGNOSIS — Z6833 Body mass index (BMI) 33.0-33.9, adult: Secondary | ICD-10-CM | POA: Diagnosis not present

## 2020-04-21 DIAGNOSIS — Z96652 Presence of left artificial knee joint: Secondary | ICD-10-CM | POA: Diagnosis not present

## 2020-04-21 DIAGNOSIS — Z1389 Encounter for screening for other disorder: Secondary | ICD-10-CM | POA: Diagnosis not present

## 2020-04-21 DIAGNOSIS — Z1331 Encounter for screening for depression: Secondary | ICD-10-CM | POA: Diagnosis not present

## 2020-04-21 DIAGNOSIS — Z01810 Encounter for preprocedural cardiovascular examination: Secondary | ICD-10-CM | POA: Diagnosis not present

## 2020-04-21 DIAGNOSIS — M5136 Other intervertebral disc degeneration, lumbar region: Secondary | ICD-10-CM | POA: Diagnosis not present

## 2020-04-21 DIAGNOSIS — Z6982 Encounter for mental health services for perpetrator of other abuse: Secondary | ICD-10-CM | POA: Diagnosis not present

## 2020-04-21 DIAGNOSIS — K219 Gastro-esophageal reflux disease without esophagitis: Secondary | ICD-10-CM | POA: Diagnosis not present

## 2020-04-21 DIAGNOSIS — R7309 Other abnormal glucose: Secondary | ICD-10-CM | POA: Diagnosis not present

## 2020-04-21 DIAGNOSIS — Z981 Arthrodesis status: Secondary | ICD-10-CM | POA: Diagnosis not present

## 2020-04-21 DIAGNOSIS — E7849 Other hyperlipidemia: Secondary | ICD-10-CM | POA: Diagnosis not present

## 2020-04-29 NOTE — Patient Instructions (Addendum)
DUE TO COVID-19 ONLY ONE VISITOR IS ALLOWED TO COME WITH YOU AND STAY IN THE WAITING ROOM ONLY DURING PRE OP AND PROCEDURE.   IF YOU WILL BE ADMITTED INTO THE HOSPITAL YOU ARE ALLOWED ONE SUPPORT PERSON DURING VISITATION HOURS ONLY (10AM -8PM)   . The support person may change daily. . The support person must pass our screening, gel in and out, and wear a mask at all times, including in the patient's room. . Patients must also wear a mask when staff or their support person are in the room.   COVID SWAB TESTING MUST BE COMPLETED ON:   Friday, 05-08-20 @ 11:05 AM   [address] [place], [place] [address]  (Must self quarantine after testing. Follow instructions on handout.)   Your procedure is scheduled on:  Monday, 05-11-20   Report to [facility] Main  Entrance   Report to Short Stay at 5:30 AM   [facility])    Call this number if you have problems the morning of surgery [phone]   Do not eat food :After Midnight.   May have liquids until 4:15 AM  day of surgery  CLEAR LIQUID DIET  Foods Allowed                                                                     Foods Excluded  Water, Black Coffee and tea, regular and decaf                liquids that you cannot  Plain Jell-O in any flavor  (No red)                                       see through such as: Fruit ices (not with fruit pulp)                                      milk, soups, orange juice              Iced Popsicles (No red)                                      All solid food                                   Apple juices Sports drinks like Gatorade (No red) Lightly seasoned clear broth or consume(fat free) Sugar, honey syrup     Complete one Ensure drink the morning of surgery at  4:15 AM  the day of surgery.    Oral Hygiene is also important to reduce your risk of infection.                                    Remember - BRUSH YOUR TEETH THE MORNING OF SURGERY WITH YOUR REGULAR  TOOTHPASTE   Do NOT smoke  after Midnight   Take these medicines the morning of surgery with A SIP OF WATER:  Omeprazole, Gabapentin                                You may not have any metal on your body including  jewelry, and body piercings             Do not wear lotions, powders, perfumes/cologne, or deodorant             Men may shave face and neck.   Do not bring valuables to the hospital. [place].   Contacts, dentures or bridgework may not be worn into surgery.   Bring small overnight bag day of surgery.    Special Instructions: Bring a copy of your healthcare power of attorney and living will documents         the day of surgery if you haven't scanned them in before.              Please read over the following fact sheets you were given: IF YOU HAVE QUESTIONS ABOUT YOUR PRE OP INSTRUCTIONS PLEASE CALL [phone]   [place] - Preparing for Surgery Before surgery, you can play an important role.  Because skin is not sterile, your skin needs to be as free of germs as possible.  You can reduce the number of germs on your skin by washing with CHG (chlorahexidine gluconate) soap before surgery.  CHG is an antiseptic cleaner which kills germs and bonds with the skin to continue killing germs even after washing. Please DO NOT use if you have an allergy to CHG or antibacterial soaps.  If your skin becomes reddened/irritated stop using the CHG and inform your nurse when you arrive at Short Stay. Do not shave (including legs and underarms) for at least 48 hours prior to the first CHG shower.  You may shave your face/neck.  Please follow these instructions carefully:  1.  Shower with CHG Soap the night before surgery and the  morning of surgery.  2.  If you choose to wash your hair, wash your hair first as usual with your normal  shampoo.  3.  After you shampoo, rinse your hair and body thoroughly to remove the shampoo.                              4.  Use CHG as you would any other liquid soap.  You can apply chg directly to the skin and wash.  Gently with a scrungie or clean washcloth.  5.  Apply the CHG Soap to your body ONLY FROM THE NECK DOWN.   Do   not use on face/ open                           Wound or open sores. Avoid contact with eyes, ears mouth and   genitals (private parts).                       Wash face,  Genitals (private parts) with your normal soap.             6.  Wash thoroughly, paying special attention to the area where your    surgery  will be performed.  7.  Thoroughly  rinse your body with warm water from the neck down.  8.  DO NOT shower/wash with your normal soap after using and rinsing off the CHG Soap.                9.  Pat yourself dry with a clean towel.            10.  Wear clean pajamas.            11.  Place clean sheets on your bed the night of your first shower and do not  sleep with pets. Day of Surgery : Do not apply any lotions/deodorants the morning of surgery.  Please wear clean clothes to the hospital/surgery center.  FAILURE TO FOLLOW THESE INSTRUCTIONS MAY RESULT IN THE CANCELLATION OF YOUR SURGERY  PATIENT SIGNATURE_________________________________  NURSE SIGNATURE__________________________________  ________________________________________________________________________   Patrick Mathews  An incentive spirometer is a tool that can help keep your lungs clear and active. This tool measures how well you are filling your lungs with each breath. Taking long deep breaths may help reverse or decrease the chance of developing breathing (pulmonary) problems (especially infection) following:  A long period of time when you are unable to move or be active. BEFORE THE PROCEDURE   If the spirometer includes an indicator to show your best effort, your nurse or respiratory therapist will set it to a desired goal.  If possible, sit up straight or lean slightly forward. Try not to  slouch.  Hold the incentive spirometer in an upright position. INSTRUCTIONS FOR USE  1. Sit on the edge of your bed if possible, or sit up as far as you can in bed or on a chair. 2. Hold the incentive spirometer in an upright position. 3. Breathe out normally. 4. Place the mouthpiece in your mouth and seal your lips tightly around it. 5. Breathe in slowly and as deeply as possible, raising the piston or the ball toward the top of the column. 6. Hold your breath for 3-5 seconds or for as long as possible. Allow the piston or ball to fall to the bottom of the column. 7. Remove the mouthpiece from your mouth and breathe out normally. 8. Rest for a few seconds and repeat Steps 1 through 7 at least 10 times every 1-2 hours when you are awake. Take your time and take a few normal breaths between deep breaths. 9. The spirometer may include an indicator to show your best effort. Use the indicator as a goal to work toward during each repetition. 10. After each set of 10 deep breaths, practice coughing to be sure your lungs are clear. If you have an incision (the cut made at the time of surgery), support your incision when coughing by placing a pillow or rolled up towels firmly against it. Once you are able to get out of bed, walk around indoors and cough well. You may stop using the incentive spirometer when instructed by your caregiver.  RISKS AND COMPLICATIONS  Take your time so you do not get dizzy or light-headed.  If you are in pain, you may need to take or ask for pain medication before doing incentive spirometry. It is harder to take a deep breath if you are having pain. AFTER USE  Rest and breathe slowly and easily.  It can be helpful to keep track of a log of your progress. Your caregiver can provide you with a simple table to help with this. If you are using the spirometer at  home, follow these instructions: [place] IF:   You are having difficultly using the spirometer.  You  have trouble using the spirometer as often as instructed.  Your pain medication is not giving enough relief while using the spirometer.  You develop fever of 100.5 F (38.1 C) or higher. SEEK IMMEDIATE MEDICAL CARE IF:   You cough up bloody sputum that had not been present before.  You develop fever of 102 F (38.9 C) or greater.  You develop worsening pain at or near the incision site. MAKE SURE YOU:   Understand these instructions.  Will watch your condition.  Will get help right away if you are not doing well or get worse. Document Released: 10/10/2006 Document Revised: 08/22/2011 Document Reviewed: 12/11/2006 [facility] Patient Information 2014 [place], [place].   ________________________________________________________________________

## 2020-04-29 NOTE — Progress Notes (Addendum)
COVID Vaccine Completed:  x2 Date COVID Vaccine completed:  February 2021 2nd dose COVID vaccine manufacturer: [place]   PCP - Sharilyn Sites, MD Cardiologist - Mertie Moores, MD  Chest x-ray - 05-24-19 in Epic EKG - 07-11-19 in Epic Stress Test - 07-22-19 in Epic ECHO -  Cardiac Cath -  Pacemaker/ICD device last checked:  Sleep Study - 20 years ago, borderline sleep apnea CPAP - No  Fasting Blood Sugar -  Checks Blood Sugar _____ times a day  Blood Thinner Instructions: Aspirin Instructions: Last Dose:  Anesthesia review:  CAD, exertional dyspnea, chest pain.    Patient denies shortness of breath, fever, cough and chest pain at PAT appointment.  Pt able to climb a flight of stairs and do ADL's without assistance.   Patient verbalized understanding of instructions that were given to them at the PAT appointment. Patient was also instructed that they will need to review over the PAT instructions again at home before surgery.

## 2020-04-30 ENCOUNTER — Other Ambulatory Visit: Payer: Self-pay

## 2020-04-30 ENCOUNTER — Encounter (HOSPITAL_COMMUNITY): Payer: Self-pay

## 2020-04-30 ENCOUNTER — Encounter (HOSPITAL_COMMUNITY)
Admission: RE | Admit: 2020-04-30 | Discharge: 2020-04-30 | Disposition: A | Discharge status: Home / Self Care | Payer: Medicare Other | Source: Ambulatory Visit | Attending: Orthopedic Surgery | Admitting: Orthopedic Surgery

## 2020-04-30 DIAGNOSIS — K219 Gastro-esophageal reflux disease without esophagitis: Secondary | ICD-10-CM | POA: Diagnosis not present

## 2020-04-30 DIAGNOSIS — I10 Essential (primary) hypertension: Secondary | ICD-10-CM | POA: Insufficient documentation

## 2020-04-30 DIAGNOSIS — Z01812 Encounter for preprocedural laboratory examination: Secondary | ICD-10-CM | POA: Insufficient documentation

## 2020-04-30 DIAGNOSIS — F17210 Nicotine dependence, cigarettes, uncomplicated: Secondary | ICD-10-CM | POA: Insufficient documentation

## 2020-04-30 DIAGNOSIS — M1711 Unilateral primary osteoarthritis, right knee: Secondary | ICD-10-CM | POA: Diagnosis not present

## 2020-04-30 DIAGNOSIS — Z79899 Other long term (current) drug therapy: Secondary | ICD-10-CM | POA: Diagnosis not present

## 2020-04-30 DIAGNOSIS — I2510 Atherosclerotic heart disease of native coronary artery without angina pectoris: Secondary | ICD-10-CM | POA: Insufficient documentation

## 2020-04-30 LAB — COMPREHENSIVE METABOLIC PANEL
ALT: 21 U/L (ref 0–44)
AST: 20 U/L (ref 15–41)
Albumin: 4.7 g/dL (ref 3.5–5.0)
Alkaline Phosphatase: 106 U/L (ref 38–126)
Anion gap: 6 (ref 5–15)
BUN: 19 mg/dL (ref 8–23)
CO2: 30 mmol/L (ref 22–32)
Calcium: 9.4 mg/dL (ref 8.9–10.3)
Chloride: 104 mmol/L (ref 98–111)
Creatinine, Ser: 1.17 mg/dL (ref 0.61–1.24)
GFR, Estimated: >60 mL/min (ref >60)
Glucose, Bld: 106 mg/dL — ABNORMAL HIGH (ref 70–99)
Potassium: 4.4 mmol/L (ref 3.5–5.1)
Sodium: 140 mmol/L (ref 135–145)
Total Bilirubin: 0.8 mg/dL (ref 0.3–1.2)
Total Protein: 7.8 g/dL (ref 6.5–8.1)

## 2020-04-30 LAB — CBC WITH DIFFERENTIAL/PLATELET
Abs Immature Granulocytes: 0.02 10*3/uL (ref 0.00–0.07)
Basophils Absolute: 0 10*3/uL (ref 0.0–0.1)
Basophils Relative: 1 %
Eosinophils Absolute: 0.1 10*3/uL (ref 0.0–0.5)
Eosinophils Relative: 2 %
HCT: 46.4 % (ref 39.0–52.0)
Hemoglobin: 15.7 g/dL (ref 13.0–17.0)
Immature Granulocytes: 0 %
Lymphocytes Relative: 26 %
Lymphs Abs: 1.3 10*3/uL (ref 0.7–4.0)
MCH: 31 pg (ref 26.0–34.0)
MCHC: 33.8 g/dL (ref 30.0–36.0)
MCV: 91.5 fL (ref 80.0–100.0)
Monocytes Absolute: 0.6 10*3/uL (ref 0.1–1.0)
Monocytes Relative: 13 %
Neutro Abs: 2.9 10*3/uL (ref 1.7–7.7)
Neutrophils Relative %: 58 %
Platelets: 179 10*3/uL (ref 150–400)
RBC: 5.07 MIL/uL (ref 4.22–5.81)
RDW: 12.9 % (ref 11.5–15.5)
WBC: 4.9 10*3/uL (ref 4.0–10.5)
nRBC: 0 % (ref 0.0–0.2)

## 2020-04-30 LAB — PROTIME-INR
INR: 1 (ref 0.8–1.2)
Prothrombin Time: 13.2 seconds (ref 11.4–15.2)

## 2020-04-30 LAB — APTT: aPTT: 30 seconds (ref 24–36)

## 2020-04-30 LAB — SURGICAL PCR SCREEN
MRSA, PCR: NEGATIVE
Staphylococcus aureus: NEGATIVE

## 2020-05-01 LAB — URINE CULTURE: Culture: NO GROWTH

## 2020-05-01 NOTE — Care Plan (Signed)
Ortho Bundle Case Management Note  Patient Details  Name: Patrick Mathews MRN: [id number] Date of Birth: 1950/09/07   met with patient in the office prior to surgery. He will discharge to home with family to assist. Rolling walker and CPM ordered for home use. OPPT set up at [facility]. Patient and MD in agreement with plan. Choice offered.                   DME Arranged:  Gilford Rile rolling, CPM DME Agency:  [employer]  HH Arranged:    [place] Agency:     Additional Comments: Please contact me with any questions of if this plan should need to change.  Ladell Heads,  [facility] Specialist  [phone] 05/01/2020, 12:45 PM

## 2020-05-01 NOTE — Anesthesia Preprocedure Evaluation (Addendum)
Anesthesia Evaluation  Patient identified by MRN, date of birth, ID band Patient awake    Reviewed: Allergy & Precautions, NPO status , Patient's Chart, lab work & pertinent test results  Airway Mallampati: III  TM Distance: <3 FB Neck ROM: Full    Dental no notable dental hx.    Pulmonary asthma , Current Smoker,    Pulmonary exam normal breath sounds clear to auscultation       Cardiovascular hypertension, Normal cardiovascular exam Rhythm:Regular Rate:Normal     Neuro/Psych negative neurological ROS  negative psych ROS   GI/Hepatic Neg liver ROS, GERD  ,  Endo/Other  negative endocrine ROS  Renal/GU negative Renal ROS  negative genitourinary   Musculoskeletal  (+) Arthritis , Osteoarthritis,    Abdominal   Peds negative pediatric ROS (+)  Hematology negative hematology ROS (+)   Anesthesia Other Findings   Reproductive/Obstetrics negative OB ROS                            Anesthesia Physical Anesthesia Plan  ASA: III  Anesthesia Plan: Spinal   Post-op Pain Management:  Regional for Post-op pain   Induction: Intravenous  PONV Risk Score and Plan: 2 and Ondansetron, Dexamethasone and Treatment may vary due to age or medical condition  Airway Management Planned: Simple Face Mask  Additional Equipment:   Intra-op Plan:   Post-operative Plan:   Informed Consent: I have reviewed the patients History and Physical, chart, labs and discussed the procedure including the risks, benefits and alternatives for the proposed anesthesia with the patient or authorized representative who has indicated his/her understanding and acceptance.     Dental advisory given  Plan Discussed with: CRNA and Surgeon  Anesthesia Plan Comments: (See PAT note 04/30/2020, Konrad Felix, PA-C)       Anesthesia Quick Evaluation

## 2020-05-01 NOTE — Progress Notes (Signed)
Anesthesia Chart Review   Case: [id number] Date/Time: 05/11/20 0700   Procedure: TOTAL KNEE ARTHROPLASTY (Right Knee)   Anesthesia type: Spinal   Pre-op diagnosis: djd right knee   Location: [facility] ROOM 04 / [facility]   Surgeons: Elsie Saas, MD      DISCUSSION:69 y.o. current some day smoker with h/o GERD, CAD, HTN, right knee djd scheduled for above procedure 05/11/2020 with Dr. Elsie Saas.   Lumbar fusion L2-L5 with hardware in place. Pt s/p lumbar fusion 02/06/2020 with no anesthesia complications noted.    Per previous anesthesia chart review, "Patient recently evaluated by cardiologist Dr. Acie Fredrickson for report of chest pain/pressure.  He was reportedly quite active, golfing, working in the yard, walking and reported that his chest discomfort generally happened in the evenings while sitting in recliner several hours after dinner.  Dr. Acie Fredrickson ordered stress Myoview which showed no evidence of ischemia and normal left ventricular systolic function.  He was last seen by Dr. Acie Fredrickson 09/27/2019 and doing well at that time, he was advised to follow-up on an as-needed basis."  Anticipate pt can proceed with planned procedure barring acute status change.   VS: BP (!) 157/72   Pulse 60   Temp (!) 36.4 C (Oral)   Resp 16   Ht 5\' 10"  (1.778 m)   Wt 99.8 kg   SpO2 100%   BMI 31.57 kg/m   PROVIDERS: Sharilyn Sites, MD is PCP   Mertie Moores, MD is Cardiologist  LABS: Labs reviewed: Acceptable for surgery. (all labs ordered are listed, but only abnormal results are displayed)  Labs Reviewed  COMPREHENSIVE METABOLIC PANEL - Abnormal; Notable for the following components:      Result Value   Glucose, Bld 106 (*)    All other components within normal limits  SURGICAL PCR SCREEN  URINE CULTURE  APTT  CBC WITH DIFFERENTIAL/PLATELET  PROTIME-INR     IMAGES:   EKG: 07/11/19 Rate 59 bpm  Sinus brady RBBB  CV: Myocardial Perfusion 07/22/2019  Nuclear stress EF: 70%.  There was no ST  segment deviation noted during stress.  The study is normal.  This is a low risk study.  The left ventricular ejection fraction is hyperdynamic (>65%).   Normal resting and stress perfusion. No ischemia or infarction EF 70% Baseline ECG with RBBB  Past Medical History:  Diagnosis Date  . Adjacent segment disease with spinal stenosis   . Arthritis    "knees; back" (07/14/2014)  . Asthma   . CAD (coronary artery disease)   . Chest pain   . Exertional dyspnea   . GERD (gastroesophageal reflux disease)   . Hyperlipidemia   . Hypertension   . Pneumonia 11/2013  . Primary localized osteoarthritis of left knee   . Seasonal allergies     Past Surgical History:  Procedure Laterality Date  . ANTERIOR CERVICAL DECOMP/DISCECTOMY FUSION  2009   Dr Joya Salm  . BACK SURGERY    . BUNIONECTOMY Right  2013  . COLONOSCOPY N/A 11/06/2012   Procedure: COLONOSCOPY;  Surgeon: Jamesetta So, MD;  Location[facility];  Service: Gastroenterology;  Laterality: N/A;  . FOOT SURGERY    . JOINT REPLACEMENT    . KNEE ARTHROSCOPY Bilateral 1990's  . [place]; 2009   Dr Joya Salm  . TONSILLECTOMY  1950's  . TOTAL KNEE ARTHROPLASTY Left 07/14/2014  . TOTAL KNEE ARTHROPLASTY Left 07/14/2014   Procedure: LEFT TOTAL KNEE ARTHROPLASTY;  Surgeon: [name], MD;  Location: [place];  Service: Orthopedics;  Laterality: Left;  . WISDOM TOOTH EXTRACTION      MEDICATIONS: . acetaminophen (TYLENOL) 325 MG tablet  . cyclobenzaprine (FLEXERIL) 10 MG tablet  . docusate sodium (COLACE) 100 MG capsule  . gabapentin (NEURONTIN) 300 MG capsule  . gabapentin (NEURONTIN) 600 MG tablet  . HYDROcodone-acetaminophen (NORCO) 10-325 MG tablet  . ibuprofen (ADVIL) 200 MG tablet  . naproxen sodium (ALEVE) 220 MG tablet  . nitroGLYCERIN (NITROSTAT) 0.4 MG SL tablet  . omeprazole (PRILOSEC) 20 MG capsule  . rosuvastatin (CRESTOR) 10 MG tablet  . sildenafil (VIAGRA) 100 MG tablet  . tadalafil (CIALIS) 5  MG tablet  . trolamine salicylate (BLUE-EMU HEMP) 10 % cream   No current facility-administered medications for this encounter.     Konrad Felix, PA-C [facility] Pre-Surgical Testing [phone]

## 2020-05-04 ENCOUNTER — Encounter (HOSPITAL_COMMUNITY): Payer: Self-pay | Admitting: Orthopedic Surgery

## 2020-05-04 DIAGNOSIS — M1711 Unilateral primary osteoarthritis, right knee: Secondary | ICD-10-CM

## 2020-05-04 HISTORY — DX: Unilateral primary osteoarthritis, right knee: M17.11

## 2020-05-04 NOTE — H&P (Signed)
TOTAL KNEE ADMISSION H&P  Patient is being admitted for right total knee arthroplasty.  Subjective:  Chief Complaint:right knee pain.  HPI: Patrick Mathews, 69 y.o. male, has a history of pain and functional disability in the right knee due to arthritis and has failed non-surgical conservative treatments for greater than 12 weeks to includeNSAID's and/or analgesics, corticosteriod injections, viscosupplementation injections, flexibility and strengthening excercises, supervised PT with diminished ADL's post treatment, use of assistive devices, weight reduction as appropriate and activity modification.  Onset of symptoms was gradual, starting 10 years ago with gradually worsening course since that time. The patient noted prior procedures on the knee to include  arthroscopy and menisectomy on the right knee(s).  Patient currently rates pain in the right knee(s) at 10 out of 10 with activity. Patient has night pain, worsening of pain with activity and weight bearing, pain that interferes with activities of daily living, crepitus and joint swelling.  Patient has evidence of subchondral sclerosis, periarticular osteophytes and joint space narrowing by imaging studies. There is no active infection.  Patient Active Problem List   Diagnosis Date Noted   Primary localized osteoarthritis of right knee 05/04/2020   Lumbar adjacent segment disease with spondylolisthesis 02/06/2020   Chest pressure 09/27/2019   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   Morbid obesity ([place]) 05/08/2015   DJD (degenerative joint disease) of knee 07/14/2014   Acute upper respiratory infection 06/12/2014   Cigarette smoker 06/12/2014   Cough 06/12/2014   Primary localized osteoarthritis of left knee    Hyperlipidemia    GERD (gastroesophageal reflux disease)    Back pain    Dyspnea 05/07/2014   Multiple pulmonary nodules 05/07/2014   Past Medical History:  Diagnosis Date    Adjacent segment disease with spinal stenosis    Arthritis    "knees; back" (07/14/2014)   Asthma    CAD (coronary artery disease)    Chest pain    Exertional dyspnea    GERD (gastroesophageal reflux disease)    Hyperlipidemia    Hypertension    Pneumonia 11/2013   Primary localized osteoarthritis of left knee    Primary localized osteoarthritis of right knee 05/04/2020   Seasonal allergies     Past Surgical History:  Procedure Laterality Date   ANTERIOR CERVICAL DECOMP/DISCECTOMY FUSION  2009   Dr Evlyn Kanner SURGERY     BUNIONECTOMY Right  2013   COLONOSCOPY N/A 11/06/2012   Procedure: COLONOSCOPY;  Surgeon: Jamesetta So, MD;  Location: [facility];  Service: Gastroenterology;  Laterality: N/A;   FOOT SURGERY     JOINT REPLACEMENT     KNEE ARTHROSCOPY Bilateral 36's   POSTERIOR LUMBAR FUSION  1985; 2009   Dr Joya Salm   TONSILLECTOMY  1950's   TOTAL KNEE ARTHROPLASTY Left 07/14/2014   TOTAL KNEE ARTHROPLASTY Left 07/14/2014   Procedure: LEFT TOTAL KNEE ARTHROPLASTY;  Surgeon: [name], MD;  Location: [facility];  Service: Orthopedics;  Laterality: Left;   WISDOM TOOTH EXTRACTION      No current facility-administered medications for this encounter.   Current Outpatient Medications  Medication Sig Dispense Refill Last Dose   acetaminophen (TYLENOL) 325 MG tablet Take 650 mg by mouth every 6 (six) hours as needed for mild pain or moderate pain.      cyclobenzaprine (FLEXERIL) 10 MG tablet Take 1 tablet (10 mg total) by mouth 3 (three) times daily as needed for muscle spasms. 30 tablet 0    docusate  sodium (COLACE) 100 MG capsule Take 1 capsule (100 mg total) by mouth 2 (two) times daily. (Patient taking differently: Take 100 mg by mouth daily as needed for mild constipation or moderate constipation. ) 60 capsule 0    gabapentin (NEURONTIN) 300 MG capsule Take 300 mg by mouth daily as needed (pain).       gabapentin (NEURONTIN) 600 MG tablet  Take 600 mg by mouth at bedtime.      HYDROcodone-acetaminophen (NORCO) 10-325 MG tablet Take 1 tablet by mouth every 4 (four) hours as needed for moderate pain. 30 tablet 0    ibuprofen (ADVIL) 200 MG tablet Take 600 mg by mouth every 6 (six) hours as needed for mild pain or moderate pain.      naproxen sodium (ALEVE) 220 MG tablet Take 440 mg by mouth daily as needed (pain).      nitroGLYCERIN (NITROSTAT) 0.4 MG SL tablet Place 1 tablet (0.4 mg total) under the tongue every 5 (five) minutes as needed for chest pain. 25 tablet 6    omeprazole (PRILOSEC) 20 MG capsule Take 20 mg by mouth daily before breakfast.      rosuvastatin (CRESTOR) 10 MG tablet Take 10 mg by mouth daily.      sildenafil (VIAGRA) 100 MG tablet Take 100 mg by mouth daily as needed for erectile dysfunction.       tadalafil (CIALIS) 5 MG tablet Take 5 mg by mouth daily as needed for erectile dysfunction.       trolamine salicylate (BLUE-EMU HEMP) 10 % cream Apply 1 application topically daily as needed for muscle pain.      Allergies  Allergen Reactions   Morphine And Related Nausea And Vomiting   Sulfa Antibiotics Hives    Social History   Tobacco Use   Smoking status: Current Some Day Smoker    Years: 30.00    Types: Cigars   Smokeless tobacco: Never Used   Tobacco comment: 07/14/2014 smoked cigs on and off x 40 yrs- none since 2014, but does smoke occ cigar-  Substance Use Topics   Alcohol use: Yes    Alcohol/week: 0.0 standard drinks    Comment: occ    Family History  Problem Relation Age of Onset   CAD Mother    CVA Mother    Alzheimer's disease Mother    Dementia Father    Alzheimer's disease Father    Stroke Maternal Grandmother    Heart attack Maternal Grandfather    CAD Maternal Grandfather    Stroke Paternal Grandmother    Colon cancer Neg Hx    Colon polyps Neg Hx      Review of Systems  Constitutional: Negative.   HENT: Negative.   Eyes: Negative.   Respiratory:  Negative.   Cardiovascular: Negative.   Gastrointestinal: Negative.   Endocrine: Negative.   Genitourinary: Negative.   Musculoskeletal: Positive for arthralgias, back pain, gait problem, joint swelling and myalgias.  Skin: Negative.   Allergic/Immunologic: Negative.   Hematological: Negative.   Psychiatric/Behavioral: Negative.     Objective:  Physical Exam Constitutional:      Appearance: Normal appearance. He is normal weight.  HENT:     Head: Normocephalic and atraumatic.     Right Ear: External ear normal.     Left Ear: External ear normal.     Nose: Nose normal.     Mouth/Throat:     Mouth: Mucous membranes are moist.     Pharynx: Oropharynx is clear.  Cardiovascular:  Rate and Rhythm: Normal rate.     Pulses: Normal pulses.  Pulmonary:     Effort: Pulmonary effort is normal.  Abdominal:     General: Bowel sounds are normal.     Palpations: Abdomen is soft.  Genitourinary:    Comments: Not pertinent to current symptomatology therefore not examined. Musculoskeletal:     Cervical back: Neck supple.     Comments: Examination of his right knee reveals pain medially and laterally.  1+ crepitation.  1+ synovitis.  Range of motion 0-120 degrees.  Knee is stable with normal patella tracking.  Examination of the left knee reveals well healed total knee incision without swelling or pain.  Range of motion 0-120 degrees.  Knee is stable with normal patella tracking.    Skin:    General: Skin is warm and dry.     Capillary Refill: Capillary refill takes 2 to 3 seconds.  Neurological:     General: No focal deficit present.     Mental Status: He is alert and oriented to person, place, and time.  Psychiatric:        Mood and Affect: Mood normal.        Behavior: Behavior normal.     Vital signs in last 24 hours: Temp:  [97.4 F (36.3 C)] 97.4 F (36.3 C) (11/22 1200) Pulse Rate:  [61] 61 (11/22 1200) BP: (144)/(83) 144/83 (11/22 1200) SpO2:  [99 %] 99 % (11/22  1200) Weight:  [99.9 kg] 99.9 kg (11/22 1200)  Labs:   Estimated body mass index is 31.6 kg/m as calculated from the following:   Height as of this encounter: 5\' 10"  (1.778 m).   Weight as of this encounter: 99.9 kg.   Imaging Review Plain radiographs demonstrate severe degenerative joint disease of the right knee(s). The overall alignment issignificant varus. The bone quality appears to be good for age and reported activity level.      Assessment/Plan:  End stage arthritis, right knee  Principal Problem:   Primary localized osteoarthritis of right knee Active Problems:   GERD (gastroesophageal reflux disease)   Back pain   DJD (degenerative joint disease) of knee   Spinal stenosis of lumbar region with neurogenic claudication   The patient history, physical examination, clinical judgment of the provider and imaging studies are consistent with end stage degenerative joint disease of the right knee(s) and total knee arthroplasty is deemed medically necessary. The treatment options including medical management, injection therapy arthroscopy and arthroplasty were discussed at length. The risks and benefits of total knee arthroplasty were presented and reviewed. The risks due to aseptic loosening, infection, stiffness, patella tracking problems, thromboembolic complications and other imponderables were discussed. The patient acknowledged the explanation, agreed to proceed with the plan and consent was signed. Patient is being admitted for inpatient treatment for surgery, pain control, PT, OT, prophylactic antibiotics, VTE prophylaxis, progressive ambulation and ADL's and discharge planning. The patient is planning to be discharged to home with outpatient physical therapy in [place] on [address] on Wednesday     Patient's anticipated LOS is less than 2 midnights, meeting these requirements: - Younger than 70 - Lives within 1 hour of care - Has a competent adult at home to  recover with post-op recover - NO history of  - Chronic pain requiring opiods  - Diabetes  - Coronary Artery Disease  - Heart failure  - Heart attack  - Stroke  - DVT/VTE  - Cardiac arrhythmia  - Respiratory Failure/COPD  -  Renal failure  - Anemia  - Advanced Liver disease

## 2020-05-08 ENCOUNTER — Other Ambulatory Visit (HOSPITAL_COMMUNITY)
Admission: RE | Admit: 2020-05-08 | Discharge: 2020-05-08 | Disposition: A | Discharge status: Home / Self Care | Payer: Medicare Other | Source: Ambulatory Visit | Attending: Orthopedic Surgery | Admitting: Orthopedic Surgery

## 2020-05-08 DIAGNOSIS — Z20822 Contact with and (suspected) exposure to covid-19: Secondary | ICD-10-CM | POA: Insufficient documentation

## 2020-05-08 DIAGNOSIS — Z01812 Encounter for preprocedural laboratory examination: Secondary | ICD-10-CM | POA: Insufficient documentation

## 2020-05-08 LAB — SARS CORONAVIRUS 2 (TAT 6-24 HRS): SARS Coronavirus 2: NEGATIVE

## 2020-05-10 MED ORDER — BUPIVACAINE LIPOSOME 1.3 % IJ SUSP
20.0000 mL | Freq: Once | INTRAMUSCULAR | Status: DC
  Filled 2020-05-10: qty 20

## 2020-05-11 ENCOUNTER — Other Ambulatory Visit: Payer: Self-pay

## 2020-05-11 ENCOUNTER — Encounter (HOSPITAL_COMMUNITY): Payer: Self-pay | Admitting: Orthopedic Surgery

## 2020-05-11 ENCOUNTER — Encounter (HOSPITAL_COMMUNITY)
Admission: RE | Disposition: A | Discharge status: Home / Self Care | Payer: Self-pay | Source: Home / Self Care | Attending: Orthopedic Surgery

## 2020-05-11 ENCOUNTER — Ambulatory Visit (HOSPITAL_COMMUNITY): Payer: Medicare Other | Admitting: Certified Registered Nurse Anesthetist

## 2020-05-11 ENCOUNTER — Observation Stay (HOSPITAL_COMMUNITY)
Admission: RE | Admit: 2020-05-11 | Discharge: 2020-05-12 | Disposition: A | Discharge status: Home / Self Care | Payer: Medicare Other | Attending: Orthopedic Surgery | Admitting: Orthopedic Surgery

## 2020-05-11 ENCOUNTER — Ambulatory Visit (HOSPITAL_COMMUNITY): Payer: Medicare Other | Admitting: Physician Assistant

## 2020-05-11 DIAGNOSIS — Z7982 Long term (current) use of aspirin: Secondary | ICD-10-CM | POA: Diagnosis not present

## 2020-05-11 DIAGNOSIS — M1711 Unilateral primary osteoarthritis, right knee: Secondary | ICD-10-CM | POA: Diagnosis not present

## 2020-05-11 DIAGNOSIS — Z79899 Other long term (current) drug therapy: Secondary | ICD-10-CM | POA: Insufficient documentation

## 2020-05-11 DIAGNOSIS — I2510 Atherosclerotic heart disease of native coronary artery without angina pectoris: Secondary | ICD-10-CM | POA: Diagnosis not present

## 2020-05-11 DIAGNOSIS — J45909 Unspecified asthma, uncomplicated: Secondary | ICD-10-CM | POA: Insufficient documentation

## 2020-05-11 DIAGNOSIS — F17290 Nicotine dependence, other tobacco product, uncomplicated: Secondary | ICD-10-CM | POA: Diagnosis not present

## 2020-05-11 DIAGNOSIS — Z96653 Presence of artificial knee joint, bilateral: Secondary | ICD-10-CM | POA: Insufficient documentation

## 2020-05-11 DIAGNOSIS — M25561 Pain in right knee: Secondary | ICD-10-CM | POA: Diagnosis present

## 2020-05-11 DIAGNOSIS — M549 Dorsalgia, unspecified: Secondary | ICD-10-CM | POA: Diagnosis present

## 2020-05-11 DIAGNOSIS — M48062 Spinal stenosis, lumbar region with neurogenic claudication: Secondary | ICD-10-CM | POA: Diagnosis present

## 2020-05-11 DIAGNOSIS — M179 Osteoarthritis of knee, unspecified: Secondary | ICD-10-CM | POA: Diagnosis present

## 2020-05-11 DIAGNOSIS — M1710 Unilateral primary osteoarthritis, unspecified knee: Secondary | ICD-10-CM | POA: Diagnosis present

## 2020-05-11 DIAGNOSIS — I10 Essential (primary) hypertension: Secondary | ICD-10-CM | POA: Insufficient documentation

## 2020-05-11 DIAGNOSIS — Z7952 Long term (current) use of systemic steroids: Secondary | ICD-10-CM | POA: Diagnosis not present

## 2020-05-11 DIAGNOSIS — Z791 Long term (current) use of non-steroidal anti-inflammatories (NSAID): Secondary | ICD-10-CM | POA: Insufficient documentation

## 2020-05-11 DIAGNOSIS — K219 Gastro-esophageal reflux disease without esophagitis: Secondary | ICD-10-CM | POA: Diagnosis present

## 2020-05-11 DIAGNOSIS — G8918 Other acute postprocedural pain: Secondary | ICD-10-CM | POA: Diagnosis not present

## 2020-05-11 HISTORY — PX: TOTAL KNEE ARTHROPLASTY: SHX125

## 2020-05-11 SURGERY — ARTHROPLASTY, KNEE, TOTAL
Anesthesia: Spinal | Site: Knee | Laterality: Right

## 2020-05-11 MED ORDER — ONDANSETRON HCL 4 MG/2ML IJ SOLN
INTRAMUSCULAR | Status: DC | PRN
  Administered 2020-05-11: 4 mg via INTRAVENOUS

## 2020-05-11 MED ORDER — POTASSIUM CHLORIDE IN NACL 20-0.9 MEQ/L-% IV SOLN
INTRAVENOUS | Status: DC
  Filled 2020-05-11 (×3): qty 1000

## 2020-05-11 MED ORDER — DEXAMETHASONE SODIUM PHOSPHATE 10 MG/ML IJ SOLN
8.0000 mg | Freq: Once | INTRAMUSCULAR | Status: AC
  Administered 2020-05-11: 8 mg via INTRAVENOUS

## 2020-05-11 MED ORDER — DEXAMETHASONE SODIUM PHOSPHATE 10 MG/ML IJ SOLN
10.0000 mg | Freq: Three times a day (TID) | INTRAMUSCULAR | Status: AC
  Administered 2020-05-11: 10 mg via INTRAVENOUS
  Filled 2020-05-11: qty 1

## 2020-05-11 MED ORDER — LACTATED RINGERS IV SOLN: INTRAVENOUS | Status: DC

## 2020-05-11 MED ORDER — MIDAZOLAM HCL 2 MG/2ML IJ SOLN
INTRAMUSCULAR | Status: AC
  Filled 2020-05-11: qty 2

## 2020-05-11 MED ORDER — ONDANSETRON HCL 4 MG/2ML IJ SOLN: 4.0000 mg | Freq: Four times a day (QID) | INTRAMUSCULAR | Status: DC | PRN

## 2020-05-11 MED ORDER — SODIUM CHLORIDE (PF) 0.9 % IJ SOLN
INTRAMUSCULAR | Status: DC | PRN
  Administered 2020-05-11: 50 mL

## 2020-05-11 MED ORDER — BUPIVACAINE LIPOSOME 1.3 % IJ SUSP
INTRAMUSCULAR | Status: DC | PRN
  Administered 2020-05-11: 20 mL

## 2020-05-11 MED ORDER — PROPOFOL 1000 MG/100ML IV EMUL
INTRAVENOUS | Status: AC
  Filled 2020-05-11: qty 100

## 2020-05-11 MED ORDER — EPHEDRINE 5 MG/ML INJ
INTRAVENOUS | Status: AC
  Filled 2020-05-11: qty 10

## 2020-05-11 MED ORDER — POVIDONE-IODINE 7.5 % EX SOLN: Freq: Once | CUTANEOUS | Status: AC

## 2020-05-11 MED ORDER — HYDROMORPHONE HCL 1 MG/ML IJ SOLN: 0.5000 mg | INTRAMUSCULAR | Status: DC | PRN

## 2020-05-11 MED ORDER — PHENYLEPHRINE 40 MCG/ML (10ML) SYRINGE FOR IV PUSH (FOR BLOOD PRESSURE SUPPORT)
PREFILLED_SYRINGE | INTRAVENOUS | Status: AC
  Filled 2020-05-11: qty 10

## 2020-05-11 MED ORDER — NITROGLYCERIN 0.4 MG SL SUBL: 0.4000 mg | SUBLINGUAL_TABLET | SUBLINGUAL | Status: DC | PRN

## 2020-05-11 MED ORDER — FENTANYL CITRATE (PF) 100 MCG/2ML IJ SOLN
INTRAMUSCULAR | Status: DC | PRN
  Administered 2020-05-11 (×2): 50 ug via INTRAVENOUS

## 2020-05-11 MED ORDER — BUPIVACAINE-EPINEPHRINE 0.25% -1:200000 IJ SOLN
INTRAMUSCULAR | Status: DC | PRN
  Administered 2020-05-11: 30 mL

## 2020-05-11 MED ORDER — TRANEXAMIC ACID-NACL 1000-0.7 MG/100ML-% IV SOLN
1000.0000 mg | INTRAVENOUS | Status: AC
  Administered 2020-05-11: 1000 mg via INTRAVENOUS
  Filled 2020-05-11: qty 100

## 2020-05-11 MED ORDER — INFLUENZA VAC A&B SA ADJ QUAD 0.5 ML IM PRSY
0.5000 mL | PREFILLED_SYRINGE | INTRAMUSCULAR | Status: DC
  Filled 2020-05-11: qty 0.5

## 2020-05-11 MED ORDER — SODIUM CHLORIDE (PF) 0.9 % IJ SOLN
INTRAMUSCULAR | Status: AC
  Filled 2020-05-11: qty 50

## 2020-05-11 MED ORDER — METOCLOPRAMIDE HCL 5 MG/ML IJ SOLN: 5.0000 mg | Freq: Three times a day (TID) | INTRAMUSCULAR | Status: DC | PRN

## 2020-05-11 MED ORDER — BUPIVACAINE HCL (PF) 0.5 % IJ SOLN
INTRAMUSCULAR | Status: DC | PRN
  Administered 2020-05-11: 20 mL via PERINEURAL

## 2020-05-11 MED ORDER — OXYCODONE HCL 5 MG PO TABS
10.0000 mg | ORAL_TABLET | ORAL | Status: DC | PRN
  Administered 2020-05-11: 10 mg via ORAL

## 2020-05-11 MED ORDER — CHLORHEXIDINE GLUCONATE 0.12 % MT SOLN
15.0000 mL | Freq: Once | OROMUCOSAL | Status: AC
  Administered 2020-05-11: 15 mL via OROMUCOSAL

## 2020-05-11 MED ORDER — ONDANSETRON HCL 4 MG PO TABS: 4.0000 mg | ORAL_TABLET | Freq: Four times a day (QID) | ORAL | Status: DC | PRN

## 2020-05-11 MED ORDER — PROPOFOL 10 MG/ML IV BOLUS
INTRAVENOUS | Status: AC
  Filled 2020-05-11: qty 40

## 2020-05-11 MED ORDER — PROPOFOL 10 MG/ML IV BOLUS
INTRAVENOUS | Status: DC | PRN
  Administered 2020-05-11: 20 mg via INTRAVENOUS

## 2020-05-11 MED ORDER — ALUM & MAG HYDROXIDE-SIMETH 200-200-20 MG/5ML PO SUSP: 30.0000 mL | ORAL | Status: DC | PRN

## 2020-05-11 MED ORDER — BUPIVACAINE-EPINEPHRINE (PF) 0.25% -1:200000 IJ SOLN
INTRAMUSCULAR | Status: AC
  Filled 2020-05-11: qty 30

## 2020-05-11 MED ORDER — POVIDONE-IODINE 10 % EX SWAB: 2.0000 "application " | Freq: Once | CUTANEOUS | Status: AC

## 2020-05-11 MED ORDER — DOCUSATE SODIUM 100 MG PO CAPS
100.0000 mg | ORAL_CAPSULE | Freq: Two times a day (BID) | ORAL | Status: DC
  Administered 2020-05-11 – 2020-05-12 (×2): 100 mg via ORAL
  Filled 2020-05-11 (×2): qty 1

## 2020-05-11 MED ORDER — ACETAMINOPHEN 500 MG PO TABS
1000.0000 mg | ORAL_TABLET | Freq: Four times a day (QID) | ORAL | Status: DC
  Administered 2020-05-11 – 2020-05-12 (×3): 1000 mg via ORAL
  Filled 2020-05-11 (×3): qty 2

## 2020-05-11 MED ORDER — POVIDONE-IODINE 10 % EX SWAB
2.0000 "application " | Freq: Once | CUTANEOUS | Status: AC
  Administered 2020-05-11: 2 via TOPICAL

## 2020-05-11 MED ORDER — ORAL CARE MOUTH RINSE: 15.0000 mL | Freq: Once | OROMUCOSAL | Status: AC

## 2020-05-11 MED ORDER — MENTHOL 3 MG MT LOZG: 1.0000 | LOZENGE | OROMUCOSAL | Status: DC | PRN

## 2020-05-11 MED ORDER — GABAPENTIN 300 MG PO CAPS
600.0000 mg | ORAL_CAPSULE | Freq: Every day | ORAL | Status: DC
  Administered 2020-05-11: 600 mg via ORAL
  Filled 2020-05-11: qty 2

## 2020-05-11 MED ORDER — ONDANSETRON HCL 4 MG/2ML IJ SOLN
INTRAMUSCULAR | Status: AC
  Filled 2020-05-11: qty 2

## 2020-05-11 MED ORDER — TRANEXAMIC ACID-NACL 1000-0.7 MG/100ML-% IV SOLN
1000.0000 mg | Freq: Once | INTRAVENOUS | Status: AC
  Administered 2020-05-11: 1000 mg via INTRAVENOUS
  Filled 2020-05-11: qty 100

## 2020-05-11 MED ORDER — DIPHENHYDRAMINE HCL 12.5 MG/5ML PO ELIX: 12.5000 mg | ORAL_SOLUTION | ORAL | Status: DC | PRN

## 2020-05-11 MED ORDER — DEXAMETHASONE SODIUM PHOSPHATE 10 MG/ML IJ SOLN
INTRAMUSCULAR | Status: AC
  Filled 2020-05-11: qty 1

## 2020-05-11 MED ORDER — PHENOL 1.4 % MT LIQD: 1.0000 | OROMUCOSAL | Status: DC | PRN

## 2020-05-11 MED ORDER — MIDAZOLAM HCL 5 MG/5ML IJ SOLN
INTRAMUSCULAR | Status: DC | PRN
  Administered 2020-05-11 (×2): 1 mg via INTRAVENOUS

## 2020-05-11 MED ORDER — HYDROMORPHONE HCL 1 MG/ML IJ SOLN: 0.2500 mg | INTRAMUSCULAR | Status: DC | PRN

## 2020-05-11 MED ORDER — FENTANYL CITRATE (PF) 100 MCG/2ML IJ SOLN
INTRAMUSCULAR | Status: AC
  Filled 2020-05-11: qty 2

## 2020-05-11 MED ORDER — CEFAZOLIN SODIUM-DEXTROSE 2-4 GM/100ML-% IV SOLN
2.0000 g | Freq: Four times a day (QID) | INTRAVENOUS | Status: AC
  Administered 2020-05-11 (×2): 2 g via INTRAVENOUS
  Filled 2020-05-11 (×2): qty 100

## 2020-05-11 MED ORDER — POLYETHYLENE GLYCOL 3350 17 G PO PACK
17.0000 g | PACK | Freq: Two times a day (BID) | ORAL | Status: DC
  Administered 2020-05-11: 17 g via ORAL
  Filled 2020-05-11: qty 1

## 2020-05-11 MED ORDER — WATER FOR IRRIGATION, STERILE IR SOLN
Status: DC | PRN
  Administered 2020-05-11: 2000 mL

## 2020-05-11 MED ORDER — EPHEDRINE SULFATE-NACL 50-0.9 MG/10ML-% IV SOSY
PREFILLED_SYRINGE | INTRAVENOUS | Status: DC | PRN
  Administered 2020-05-11 (×5): 10 mg via INTRAVENOUS

## 2020-05-11 MED ORDER — CEFAZOLIN SODIUM-DEXTROSE 2-4 GM/100ML-% IV SOLN
2.0000 g | INTRAVENOUS | Status: AC
  Administered 2020-05-11: 2 g via INTRAVENOUS
  Filled 2020-05-11: qty 100

## 2020-05-11 MED ORDER — OXYCODONE HCL 5 MG PO TABS
5.0000 mg | ORAL_TABLET | ORAL | Status: DC | PRN
  Administered 2020-05-11: 10 mg via ORAL
  Administered 2020-05-11: 5 mg via ORAL
  Administered 2020-05-12: 10 mg via ORAL
  Filled 2020-05-11 (×2): qty 2
  Filled 2020-05-11: qty 1
  Filled 2020-05-11: qty 2

## 2020-05-11 MED ORDER — METOCLOPRAMIDE HCL 5 MG PO TABS: 5.0000 mg | ORAL_TABLET | Freq: Three times a day (TID) | ORAL | Status: DC | PRN

## 2020-05-11 MED ORDER — ASPIRIN EC 325 MG PO TBEC
325.0000 mg | DELAYED_RELEASE_TABLET | Freq: Every day | ORAL | Status: DC
  Administered 2020-05-12: 325 mg via ORAL
  Filled 2020-05-11: qty 1

## 2020-05-11 MED ORDER — SODIUM CHLORIDE 0.9 % IR SOLN
Status: DC | PRN
  Administered 2020-05-11: 3000 mL

## 2020-05-11 MED ORDER — 0.9 % SODIUM CHLORIDE (POUR BTL) OPTIME
TOPICAL | Status: DC | PRN
  Administered 2020-05-11: 1000 mL

## 2020-05-11 MED ORDER — BUPIVACAINE IN DEXTROSE 0.75-8.25 % IT SOLN
INTRATHECAL | Status: DC | PRN
  Administered 2020-05-11: 1.6 mL via INTRATHECAL

## 2020-05-11 MED ORDER — PHENYLEPHRINE 40 MCG/ML (10ML) SYRINGE FOR IV PUSH (FOR BLOOD PRESSURE SUPPORT)
PREFILLED_SYRINGE | INTRAVENOUS | Status: DC | PRN
  Administered 2020-05-11: 80 ug via INTRAVENOUS

## 2020-05-11 MED ORDER — PROPOFOL 500 MG/50ML IV EMUL
INTRAVENOUS | Status: DC | PRN
  Administered 2020-05-11: 50 ug/kg/min via INTRAVENOUS

## 2020-05-11 MED ORDER — GABAPENTIN 300 MG PO CAPS
300.0000 mg | ORAL_CAPSULE | Freq: Every day | ORAL | Status: DC | PRN
  Administered 2020-05-11: 300 mg via ORAL
  Filled 2020-05-11: qty 1

## 2020-05-11 SURGICAL SUPPLY — 65 items
APL PRP STRL LF DISP 70% ISPRP (MISCELLANEOUS) ×2
ATTUNE MED DOME PAT 38 KNEE (Knees) ×2 IMPLANT
ATTUNE MED DOME PAT 38MM KNEE (Knees) ×1 IMPLANT
ATTUNE PS FEM RT SZ 8 CEM KNEE (Femur) ×3 IMPLANT
ATTUNE PSRP INSR SZ8 6 KNEE (Insert) ×2 IMPLANT
ATTUNE PSRP INSR SZ8 6MM KNEE (Insert) ×1 IMPLANT
BAG SPEC THK2 15X12 ZIP CLS (MISCELLANEOUS) ×1
BAG ZIPLOCK 12X15 (MISCELLANEOUS) ×3 IMPLANT
BASE TIBIAL ROT PLAT SZ 8 KNEE (Knees) ×1 IMPLANT
BLADE SAGITTAL 25.0X1.19X90 (BLADE) ×2 IMPLANT
BLADE SAGITTAL 25.0X1.19X90MM (BLADE) ×1
BLADE SAW SGTL 13X75X1.27 (BLADE) ×3 IMPLANT
BLADE SURG SZ10 CARB STEEL (BLADE) ×6 IMPLANT
BOWL SMART MIX CTS (DISPOSABLE) ×3 IMPLANT
BSPLAT TIB 8 CMNT ROT PLAT STR (Knees) ×1 IMPLANT
CEMENT HV SMART SET (Cement) ×3 IMPLANT
CHLORAPREP W/TINT 26 (MISCELLANEOUS) ×6 IMPLANT
CLOSURE STERI-STRIP 1/2X4 (GAUZE/BANDAGES/DRESSINGS) ×1
CLOSURE WOUND 1/2 X4 (GAUZE/BANDAGES/DRESSINGS) ×1
CLSR STERI-STRIP ANTIMIC 1/2X4 (GAUZE/BANDAGES/DRESSINGS) ×2 IMPLANT
COVER SURGICAL LIGHT HANDLE (MISCELLANEOUS) ×3 IMPLANT
COVER WAND RF STERILE (DRAPES) IMPLANT
CUFF TOURN SGL QUICK 34 (TOURNIQUET CUFF) ×3
CUFF TRNQT CYL 34X4.125X (TOURNIQUET CUFF) ×1 IMPLANT
DECANTER SPIKE VIAL GLASS SM (MISCELLANEOUS) ×6 IMPLANT
DRAPE SHEET LG 3/4 BI-LAMINATE (DRAPES) ×3 IMPLANT
DRAPE U-SHAPE 47X51 STRL (DRAPES) ×3 IMPLANT
DRSG AQUACEL AG ADV 3.5X10 (GAUZE/BANDAGES/DRESSINGS) ×3 IMPLANT
DRSG PAD ABDOMINAL 8X10 ST (GAUZE/BANDAGES/DRESSINGS) ×3 IMPLANT
ELECT REM PT RETURN 15FT ADLT (MISCELLANEOUS) ×3 IMPLANT
GLOVE BIO SURGEON STRL SZ7 (GLOVE) ×3 IMPLANT
GLOVE BIOGEL PI IND STRL 7.0 (GLOVE) ×1 IMPLANT
GLOVE BIOGEL PI IND STRL 7.5 (GLOVE) ×1 IMPLANT
GLOVE BIOGEL PI INDICATOR 7.0 (GLOVE) ×2
GLOVE BIOGEL PI INDICATOR 7.5 (GLOVE) ×2
GLOVE SS BIOGEL STRL SZ 7.5 (GLOVE) ×1 IMPLANT
GLOVE SUPERSENSE BIOGEL SZ 7.5 (GLOVE) ×2
GOWN STRL REUS W/ TWL LRG LVL3 (GOWN DISPOSABLE) ×2 IMPLANT
GOWN STRL REUS W/TWL LRG LVL3 (GOWN DISPOSABLE) ×6
HANDPIECE INTERPULSE COAX TIP (DISPOSABLE) ×3
HOLDER FOLEY CATH W/STRAP (MISCELLANEOUS) IMPLANT
HOOD PEEL AWAY FLYTE STAYCOOL (MISCELLANEOUS) ×9 IMPLANT
KIT TURNOVER KIT A (KITS) ×3 IMPLANT
MANIFOLD NEPTUNE II (INSTRUMENTS) ×3 IMPLANT
MARKER SKIN DUAL TIP RULER LAB (MISCELLANEOUS) ×3 IMPLANT
NDL SAFETY ECLIPSE 18X1.5 (NEEDLE) ×1 IMPLANT
NEEDLE HYPO 18GX1.5 SHARP (NEEDLE) ×3
NS IRRIG 1000ML POUR BTL (IV SOLUTION) ×3 IMPLANT
PACK TOTAL KNEE CUSTOM (KITS) ×3 IMPLANT
PENCIL SMOKE EVACUATOR (MISCELLANEOUS) ×3 IMPLANT
PIN DRILL FIX HALF THREAD (BIT) ×3 IMPLANT
PIN STEINMAN FIXATION KNEE (PIN) ×3 IMPLANT
PROTECTOR NERVE ULNAR (MISCELLANEOUS) ×3 IMPLANT
SET HNDPC FAN SPRY TIP SCT (DISPOSABLE) ×1 IMPLANT
STAPLER VISISTAT 35W (STAPLE) IMPLANT
STRIP CLOSURE SKIN 1/2X4 (GAUZE/BANDAGES/DRESSINGS) ×2 IMPLANT
SUT MNCRL AB 3-0 PS2 18 (SUTURE) ×3 IMPLANT
SUT VIC AB 0 CT1 36 (SUTURE) ×3 IMPLANT
SUT VIC AB 1 CT1 36 (SUTURE) ×3 IMPLANT
SUT VIC AB 2-0 CT1 27 (SUTURE) ×6
SUT VIC AB 2-0 CT1 TAPERPNT 27 (SUTURE) ×2 IMPLANT
SYR CONTROL 10ML LL (SYRINGE) ×9 IMPLANT
TIBIAL BASE ROT PLAT SZ 8 KNEE (Knees) ×3 IMPLANT
TRAY FOLEY MTR SLVR 14FR STAT (SET/KITS/TRAYS/PACK) ×3 IMPLANT
WATER STERILE IRR 1000ML POUR (IV SOLUTION) ×6 IMPLANT

## 2020-05-11 NOTE — Interval H&P Note (Signed)
History and Physical Interval Note:  05/11/2020 6:18 AM  Patrick Mathews  has presented today for surgery, with the diagnosis of djd right knee.  The various methods of treatment have been discussed with the patient and family. After consideration of risks, benefits and other options for treatment, the patient has consented to  Procedure(s): TOTAL KNEE ARTHROPLASTY (Right) as a surgical intervention.  The patient's history has been reviewed, patient examined, no change in status, stable for surgery.  I have reviewed the patient's chart and labs.  Questions were answered to the patient's satisfaction.     [name]

## 2020-05-11 NOTE — Op Note (Signed)
MRN:     [id number] DOB/AGE:    August 15, 1950 / 69 y.o.       OPERATIVE REPORT   DATE OF PROCEDURE:  05/11/2020      PREOPERATIVE DIAGNOSIS:   Primary Localized Osteoarthritis right Knee       Estimated body mass index is 31.6 kg/m as calculated from the following:   Height as of this encounter: 5\' 10"  (1.778 m).   Weight as of this encounter: 99.9 kg.                                                       POSTOPERATIVE DIAGNOSIS:   Same                                                                 PROCEDURE:  Procedure(s): TOTAL KNEE ARTHROPLASTY Using Depuy Attune RP implants #8 Femur, #8Tibia, 59mm  RP bearing, 38 Patella    SURGEON: Ziyad Dyar A. Noemi Chapel, MD   ASSISTANT: Matthew Saras, PA-C, present and scrubbed throughout the case, critical for retraction, instrumentation, and closure.  ANESTHESIA: Spinal with Adductor Nerve Block  TOURNIQUET TIME: 58 minutes   COMPLICATIONS:  None       SPECIMENS: None   INDICATIONS FOR PROCEDURE: The patient has djd of the knee with varus deformities, XR shows bone on bone arthritis. Patient has failed all conservative measures including anti-inflammatory medicines, narcotics, attempts at exercise and weight loss, cortisone injections and viscosupplementation.  Risks and benefits of surgery have been discussed, questions answered.    DESCRIPTION OF PROCEDURE: The patient identified by armband, received right adductor canal block and IV antibiotics, in the holding area at [facility]. Patient taken to the operating room, appropriate anesthetic monitors were attached. Spinal anesthesia induced with the patient in supine position, Foley catheter was inserted. Tourniquet applied high to the operative thigh. Lateral post and foot positioner applied to the table, the lower extremity was then prepped and draped in usual sterile fashion from the ankle to the tourniquet. Time-out procedure was performed. The limb was wrapped with an Esmarch bandage and  the tourniquet inflated to 365 mmHg.   We began the operation by making a 6cm anterior midline incision. Small bleeders in the skin and the subcutaneous tissue identified and cauterized. Transverse retinaculum was incised and reflected medially and a medial parapatellar arthrotomy was accomplished. the patella was everted and theprepatellar fat pad resected. The superficial medial collateral ligament was then elevated from anterior to posterior along the proximal flare of the tibia and anterior half of the menisci resected. The knee was hyperflexed exposing bone on bone arthritis. Peripheral and notch osteophytes as well as the cruciate ligaments were then resected. We continued to work our way around posteriorly along the proximal tibia, and externally rotated the tibia subluxing it out from underneath the femur. A McHale retractor was placed through the notch and a lateral Hohmann retractor placed, and an external tibial guide was placed.  The tibial cutting guide was pinned into place allowing resection of 4 mm of bone medially and about 6 mm of bone laterally because of her varus  deformity.   Satisfied with the tibial resection, we then entered the distal femur 2 mm anterior to the PCL origin with the intramedullary guide rod and applied the distal femoral cutting guide set at 84mm, with 5 degrees of valgus. This was pinned along the epicondylar axis. At this point, the distal femoral cut was accomplished without difficulty. We then sized for a 8 femoral component and pinned the guide in 3 degrees of external rotation.The chamfer cutting guide was pinned into place. The anterior, posterior, and chamfer cuts were accomplished without difficulty followed by the  RP box cutting guide and the box cut. We also removed posterior osteophytes from the posterior femoral condyles. At this time, the knee was brought into full extension. We checked our extension and flexion gaps and found them symmetric at 6.  The  patella thickness measured at 23m m. We set the cutting guide at 15 and removed the posterior patella sized for 38 button and drilled the lollipop. The knee was then once again hyperflexed exposing the proximal tibia. We sized for a # 8 tibial base plate, applied the smokestack and the conical reamer followed by the the Delta fin keel punch. We then hammered into place the  RP trial femoral component, inserted a trial bearing, trial patellar button, and took the knee through range of motion from 0-130 degrees. No thumb pressure was required for patellar tracking.   At this point, all trial components were removed, a double batch of DePuy HV cement  was mixed and applied to all bony metallic mating surfaces. In order, we hammered into place the tibial tray and removed excess cement, the femoral component and removed excess cement, a 6 mm  RP bearing was inserted, and the knee brought to full extension with compression. The patellar button was clamped into place, and excess cement removed. While the cement cured the wound was irrigated out with normal saline solution pulse lavage, and exparel was injected throughout the knee. Ligament stability and patellar tracking were checked and found to be excellent..   The parapatellar arthrotomy was closed with  #1 Vicryl suture. The subcutaneous tissue with 0 and 2-0 undyed Vicryl suture, and 4-0 Monocryl.. A dressing of Aquaseal, 4 x 4, dressing sponges, Webril, and Ace wrap applied. Needle and sponge count were correct times 2.The patient awakened, extubated, and taken to recovery room without difficulty. Vascular status was normal, pulses 2+ and symmetric.    [name] 09/04/2017, 8:56 AM

## 2020-05-11 NOTE — Anesthesia Procedure Notes (Signed)
Anesthesia Regional Block: Adductor canal block   Pre-Anesthetic Checklist: ,, timeout performed, Correct Patient, Correct Site, Correct Laterality, Correct Procedure, Correct Position, site marked, Risks and benefits discussed,  Surgical consent,  Pre-op evaluation,  At surgeon's request and post-op pain management  Laterality: Right  Prep: chloraprep       Needles:  Injection technique: Single-shot  Needle Type: Echogenic Needle     Needle Length: 9cm      Additional Needles:   Procedures:,,,, ultrasound used (permanent image in chart),,,,  Narrative:  Start time: 05/11/2020 6:53 AM End time: 05/11/2020 6:58 AM Injection made incrementally with aspirations every 5 mL.  Performed by: Personally  Anesthesiologist: Myrtie Soman, MD  Additional Notes: Patient tolerated the procedure well without complications

## 2020-05-11 NOTE — Anesthesia Procedure Notes (Signed)
Procedure Name: MAC Date/Time: 05/11/2020 7:16 AM Performed by: West Pugh, CRNA Pre-anesthesia Checklist: Patient identified, Emergency Drugs available, Suction available, Patient being monitored and Timeout performed Patient Re-evaluated:Patient Re-evaluated prior to induction Oxygen Delivery Method: Simple face mask Preoxygenation: Pre-oxygenation with 100% oxygen Induction Type: IV induction Placement Confirmation: positive ETCO2 Dental Injury: Teeth and Oropharynx as per pre-operative assessment

## 2020-05-11 NOTE — Anesthesia Procedure Notes (Signed)
Anesthesia Procedure Image    

## 2020-05-11 NOTE — Evaluation (Signed)
Physical Therapy Evaluation Patient Details Name: Patrick Mathews MRN: [id number] DOB: 1950-10-24 Today's Date: 05/11/2020   History of Present Illness  Patient is 69 y.o. male s/p Rt TKA on 05/11/20 with PMH significant for HLD, HTN, GERD, CAD, OA, asthma, Lt TKA, lumbar and cervical spine surgeries.     Clinical Impression  SOLOMON SKOWRONEK is a 69 y.o. male POD 0 s/p Rt TKA. Patient reports independence with mobility at baseline. Patient is now limited by functional impairments (see PT problem list below) and requires min guard/assist for transfers and gait with RW. Patient was able to ambulate ~50 feet with RW and min assist. Patient instructed in exercise to facilitate ROM and circulation to manage edema and prevent DVT's. Patient will benefit from continued skilled PT interventions to address impairments and progress towards PLOF. Acute PT will follow to progress mobility and stair training in preparation for safe discharge home.     Follow Up Recommendations Follow surgeon's recommendation for DC plan and follow-up therapies;Outpatient PT    Equipment Recommendations  None recommended by PT    Recommendations for Other Services       Precautions / Restrictions Precautions Precautions: Fall Restrictions Weight Bearing Restrictions: No RLE Weight Bearing: Weight bearing as tolerated      Mobility  Bed Mobility Overal bed mobility: Needs Assistance Bed Mobility: Supine to Sit     Supine to sit: Min guard     General bed mobility comments: cues for use of bed rail, no assist required to move to EOB, pt taking extra time.    Transfers Overall transfer level: Needs assistance Equipment used: Rolling walker (2 wheeled) Transfers: Sit to/from Stand Sit to Stand: Min assist;Min guard         General transfer comment: cues for technique with RW, light assist to initiate power up and guarding during rise for safety  Ambulation/Gait Ambulation/Gait assistance: Min  assist;Min guard Gait Distance (Feet): 50 Feet Assistive device: Rolling walker (2 wheeled) Gait Pattern/deviations: Step-to pattern Gait velocity: decr   General Gait Details: VC's for safe step pattern and RW position. no overt LOB or buckling at Rt knee.   Stairs            Wheelchair Mobility    Modified Rankin (Stroke Patients Only)       Balance Overall balance assessment: Needs assistance Sitting-balance support: Feet supported Sitting balance-Leahy Scale: Fair     Standing balance support: During functional activity;Bilateral upper extremity supported Standing balance-Leahy Scale: Fair                               Pertinent Vitals/Pain Pain Assessment: 0-10 Pain Score: 7  Pain Location: Rt knee Pain Descriptors / Indicators: Aching;Discomfort;Sore Pain Intervention(s): Limited activity within patient's tolerance;Monitored during session;Repositioned;Ice applied    Home Living Family/patient expects to be discharged to:: Private residence Living Arrangements: Spouse/significant other Available Help at Discharge: Family Type of Home: House Home Access: Stairs to enter;Level entry Entrance Stairs-Rails: None Entrance Stairs-Number of Steps: will go in through back 1 step up Home Layout: Multi-level Home Equipment: Bedside commode;Walker - 2 wheels;Shower seat - built in Additional Comments: pt plans to stay in basement den initially. single step in through back. pt has a bedroom and bathroom availble in den. family will bring food from upstairs. pt's son will stay with him until Wednesday. His wife is currently caring for her mother who is home with COVID.  Prior Function Level of Independence: Independent         Comments: pt enjoys golfing in free time     Hand Dominance   Dominant Hand: Right    Extremity/Trunk Assessment   Upper Extremity Assessment Upper Extremity Assessment: Overall WFL for tasks assessed    Lower  Extremity Assessment Lower Extremity Assessment: RLE deficits/detail RLE Deficits / Details: good quad activation, no extensor lag with SLR,  RLE Sensation: WNL RLE Coordination: WNL    Cervical / Trunk Assessment Cervical / Trunk Assessment: Normal  Communication   Communication: No difficulties  Cognition Arousal/Alertness: Awake/alert Behavior During Therapy: WFL for tasks assessed/performed Overall Cognitive Status: Within Functional Limits for tasks assessed                                        General Comments      Exercises Total Joint Exercises Ankle Circles/Pumps: AROM;Both;20 reps;Seated Quad Sets: AROM;Right;10 reps;Seated Heel Slides: AROM;Right;10 reps;Seated   Assessment/Plan    PT Assessment Patient needs continued PT services  PT Problem List Decreased strength;Decreased range of motion;Decreased mobility;Decreased balance;Decreased activity tolerance;Decreased knowledge of use of DME;Decreased knowledge of precautions;Pain       PT Treatment Interventions DME instruction;Gait training;Stair training;Functional mobility training;Therapeutic activities;Therapeutic exercise;Balance training;Patient/family education    PT Goals (Current goals can be found in the Care Plan section)  Acute Rehab PT Goals Patient Stated Goal: get recovered and back to golf PT Goal Formulation: With patient Time For Goal Achievement: 05/18/20 Potential to Achieve Goals: Good    Frequency 7X/week   Barriers to discharge        Co-evaluation               AM-PAC PT "6 Clicks" Mobility  Outcome Measure Help needed turning from your back to your side while in a flat bed without using bedrails?: None Help needed moving from lying on your back to sitting on the side of a flat bed without using bedrails?: A Little Help needed moving to and from a bed to a chair (including a wheelchair)?: A Little Help needed standing up from a chair using your arms  (e.g., wheelchair or bedside chair)?: A Little Help needed to walk in hospital room?: A Little Help needed climbing 3-5 steps with a railing? : A Little 6 Click Score: 19    End of Session Equipment Utilized During Treatment: Gait belt Activity Tolerance: Patient tolerated treatment well Patient left: in chair;with call bell/phone within reach;with chair alarm set;with family/visitor present Nurse Communication: Mobility status;Patient requests pain meds (found tylenol on floor in room) PT Visit Diagnosis: Muscle weakness (generalized) (M62.81);Difficulty in walking, not elsewhere classified (R26.2)    Time: 2641-5830 PT Time Calculation (min) (ACUTE ONLY): 25 min   Charges:   PT Evaluation $PT Eval Low Complexity: 1 Low PT Treatments $Therapeutic Exercise: 8-22 mins        Verner Mould, DPT Acute Rehabilitation Services  Office [phone] Pager [phone]  05/11/2020 2:43 PM

## 2020-05-11 NOTE — Transfer of Care (Signed)
Immediate Anesthesia Transfer of Care Note  Patient: Patrick Mathews  Procedure(s) Performed: TOTAL KNEE ARTHROPLASTY (Right Knee)  Patient Location: PACU  Anesthesia Type:Spinal and MAC combined with regional for post-op pain  Level of Consciousness: awake, alert , oriented and patient cooperative  Airway & Oxygen Therapy: Patient Spontanous Breathing and Patient connected to face mask oxygen  Post-op Assessment: Report given to RN and Post -op Vital signs reviewed and stable  Post vital signs: Reviewed and stable  Last Vitals:  Vitals Value Taken Time  BP 106/57 05/11/20 0922  Temp    Pulse 77 05/11/20 0924  Resp 15 05/11/20 0924  SpO2 99 % 05/11/20 0924  Vitals shown include unvalidated device data.  Last Pain:  Vitals:   05/11/20 0550  TempSrc: Oral  PainSc:       Patients Stated Pain Goal: 4 (00/76/22 6333)  Complications: No complications documented.

## 2020-05-11 NOTE — Anesthesia Procedure Notes (Signed)
Spinal  Patient location during procedure: OR Start time: 05/11/2020 7:17 AM End time: 05/11/2020 7:21 AM Staffing Anesthesiologist: Myrtie Soman, MD Preanesthetic Checklist Completed: patient identified, IV checked, site marked, risks and benefits discussed, surgical consent, monitors and equipment checked, pre-op evaluation and timeout performed Spinal Block Patient position: sitting Prep: DuraPrep Patient monitoring: heart rate, cardiac monitor, continuous pulse ox and blood pressure Approach: midline Location: L3-4 Injection technique: single-shot Needle Needle type: Sprotte  Needle gauge: 24 G Needle length: 9 cm Assessment Sensory level: T6

## 2020-05-11 NOTE — Anesthesia Postprocedure Evaluation (Signed)
Anesthesia Post Note  Patient: Patrick Mathews  Procedure(s) Performed: TOTAL KNEE ARTHROPLASTY (Right Knee)     Patient location during evaluation: PACU Anesthesia Type: Spinal Level of consciousness: oriented and awake and alert Pain management: pain level controlled Vital Signs Assessment: post-procedure vital signs reviewed and stable Respiratory status: spontaneous breathing, respiratory function stable and patient connected to nasal cannula oxygen Cardiovascular status: blood pressure returned to baseline and stable Postop Assessment: no headache, no backache and no apparent nausea or vomiting Anesthetic complications: no   No complications documented.  Last Vitals:  Vitals:   05/11/20 1015 05/11/20 1036  BP: 123/72 126/69  Pulse: 68 74  Resp: (!) 21 16  Temp:  36.6 C  SpO2: 97% 100%    Last Pain:  Vitals:   05/11/20 1036  TempSrc: Oral  PainSc: 0-No pain                 Marquis Diles S

## 2020-05-11 NOTE — Progress Notes (Signed)
Orthopedic Tech Progress Note Patient Details:  Patrick Mathews [birth year] [id number]  Patient ID: [name], male   DOB: [birth year], 69 y.o.   MRN: [id number]   [name] 05/11/2020, 9:54 AM Pt placed in cpm @0954  in PACU. Bone foam on bed

## 2020-05-12 ENCOUNTER — Encounter (HOSPITAL_COMMUNITY): Payer: Self-pay | Admitting: Orthopedic Surgery

## 2020-05-12 DIAGNOSIS — Z79899 Other long term (current) drug therapy: Secondary | ICD-10-CM | POA: Diagnosis not present

## 2020-05-12 DIAGNOSIS — Z7982 Long term (current) use of aspirin: Secondary | ICD-10-CM | POA: Diagnosis not present

## 2020-05-12 DIAGNOSIS — M1711 Unilateral primary osteoarthritis, right knee: Secondary | ICD-10-CM | POA: Diagnosis not present

## 2020-05-12 DIAGNOSIS — M5127 Other intervertebral disc displacement, lumbosacral region: Secondary | ICD-10-CM | POA: Diagnosis not present

## 2020-05-12 DIAGNOSIS — J45909 Unspecified asthma, uncomplicated: Secondary | ICD-10-CM | POA: Diagnosis not present

## 2020-05-12 DIAGNOSIS — E7849 Other hyperlipidemia: Secondary | ICD-10-CM | POA: Diagnosis not present

## 2020-05-12 DIAGNOSIS — I10 Essential (primary) hypertension: Secondary | ICD-10-CM | POA: Diagnosis not present

## 2020-05-12 DIAGNOSIS — I2510 Atherosclerotic heart disease of native coronary artery without angina pectoris: Secondary | ICD-10-CM | POA: Diagnosis not present

## 2020-05-12 LAB — CBC
HCT: 39.2 % (ref 39.0–52.0)
Hemoglobin: 13.1 g/dL (ref 13.0–17.0)
MCH: 30.9 pg (ref 26.0–34.0)
MCHC: 33.4 g/dL (ref 30.0–36.0)
MCV: 92.5 fL (ref 80.0–100.0)
Platelets: 173 10*3/uL (ref 150–400)
RBC: 4.24 MIL/uL (ref 4.22–5.81)
RDW: 12.9 % (ref 11.5–15.5)
WBC: 14.9 10*3/uL — ABNORMAL HIGH (ref 4.0–10.5)
nRBC: 0 % (ref 0.0–0.2)

## 2020-05-12 LAB — BASIC METABOLIC PANEL
Anion gap: 7 (ref 5–15)
BUN: 14 mg/dL (ref 8–23)
CO2: 24 mmol/L (ref 22–32)
Calcium: 8.5 mg/dL — ABNORMAL LOW (ref 8.9–10.3)
Chloride: 108 mmol/L (ref 98–111)
Creatinine, Ser: 0.99 mg/dL (ref 0.61–1.24)
GFR, Estimated: >60 mL/min (ref >60)
Glucose, Bld: 145 mg/dL — ABNORMAL HIGH (ref 70–99)
Potassium: 5.6 mmol/L — ABNORMAL HIGH (ref 3.5–5.1)
Sodium: 139 mmol/L (ref 135–145)

## 2020-05-12 MED ORDER — ASPIRIN 325 MG PO TBEC: 325.0000 mg | DELAYED_RELEASE_TABLET | Freq: Every day | ORAL | 0 refills | Status: DC

## 2020-05-12 MED ORDER — POLYETHYLENE GLYCOL 3350 17 G PO PACK: PACK | ORAL | 0 refills | Status: DC

## 2020-05-12 MED ORDER — OXYCODONE HCL 5 MG PO TABS: ORAL_TABLET | ORAL | 0 refills | Status: DC

## 2020-05-12 NOTE — Plan of Care (Signed)

## 2020-05-12 NOTE — Progress Notes (Signed)
Patient son is concerned about going home and family supervision and care for the patient. Related concerns to PA Vonda Antigua  With Dr. Noemi Chapel. Patient is stable and ready for discharge, performed well with physical therapy.

## 2020-05-12 NOTE — Discharge Summary (Signed)
Patient ID: Patrick Mathews MRN: [id number] DOB/AGE: 08-17-1950 69 y.o.  Admit date: 05/11/2020 Discharge date: 05/12/2020  Admission Diagnoses:  Principal Problem:   Primary localized osteoarthritis of right knee Active Problems:   GERD (gastroesophageal reflux disease)   Back pain   DJD (degenerative joint disease) of knee   Spinal stenosis of lumbar region with neurogenic claudication   Discharge Diagnoses:  Same  Past Medical History:  Diagnosis Date  . Adjacent segment disease with spinal stenosis   . Arthritis    "knees; back" (07/14/2014)  . Asthma   . CAD (coronary artery disease)   . Chest pain   . Exertional dyspnea   . GERD (gastroesophageal reflux disease)   . Hyperlipidemia   . Hypertension   . Pneumonia 11/2013  . Primary localized osteoarthritis of left knee   . Primary localized osteoarthritis of right knee 05/04/2020  . Seasonal allergies     Surgeries: Procedure(s): TOTAL KNEE ARTHROPLASTY on 05/11/2020   Consultants:   Discharged Condition: Improved  Hospital Course: ALASSANE KALAFUT is an 69 y.o. male who was admitted 05/11/2020 for operative treatment ofPrimary localized osteoarthritis of right knee. Patient has severe unremitting pain that affects sleep, daily activities, and work/hobbies. After pre-op clearance the patient was taken to the operating room on 05/11/2020 and underwent  Procedure(s): TOTAL KNEE ARTHROPLASTY.    Patient was given perioperative antibiotics:  Anti-infectives (From admission, onward)   Start     Dose/Rate Route Frequency Ordered Stop   05/11/20 1400  ceFAZolin (ANCEF) IVPB 2g/100 mL premix        2 g 200 mL/hr over 30 Minutes Intravenous Every 6 hours 05/11/20 1040 05/11/20 2138   05/11/20 0600  ceFAZolin (ANCEF) IVPB 2g/100 mL premix        2 g 200 mL/hr over 30 Minutes Intravenous On call to O.R. 05/11/20 3295 05/11/20 1884       Patient was given sequential compression devices, early ambulation, and  chemoprophylaxis to prevent DVT.  Patient benefited maximally from hospital stay and there were no complications.    Recent vital signs:  Patient Vitals for the past 24 hrs: 69 y.o.   BP Temp 69 Temp src Pulse Resp SpO2  05/12/20 0616 120/72 98.4 F (36.9 C) Oral 65 16 100 %  05/12/20 0145 132/82 98.2 F (36.8 C) Oral 61 16 97 %  05/11/20 2218 123/82 98.4 F (36.9 C) Oral 73 16 98 %  05/11/20 1832 140/74 97.8 F (36.6 C) Oral 78 14 98 %  05/11/20 1344 140/89 97.9 F (36.6 C) Oral 89 19 97 %  05/11/20 1229 132/80 98 F (36.7 C) Axillary 90 16 100 %  05/11/20 1144 127/79 (!) 97.5 F (36.4 C) Oral 75 20 100 %  05/11/20 1036 126/69 97.8 F (36.6 C) Oral 74 16 100 %  05/11/20 1015 123/72 - - 68 (!) 21 97 %  05/11/20 1000 115/68 98 F (36.7 C) - 65 (!) 21 96 %  05/11/20 0945 116/67 - - 66 (!) 24 97 %     Recent laboratory studies:  Recent Labs    05/12/20 0400  WBC 14.9*  HGB 13.1  HCT 39.2  PLT 173  NA 139  K 5.6*  CL 108  CO2 24  BUN 14  CREATININE 0.99  GLUCOSE 145*  CALCIUM 8.5*     Discharge Medications:   Allergies as of 05/12/2020      Reactions   Morphine And Related Nausea And Vomiting   Sulfa Antibiotics Hives  Medication List    STOP taking these medications   HYDROcodone-acetaminophen 10-325 MG tablet Commonly known as: NORCO   ibuprofen 200 MG tablet Commonly known as: ADVIL   naproxen sodium 220 MG tablet Commonly known as: ALEVE     TAKE these medications   acetaminophen 325 MG tablet Commonly known as: TYLENOL Take 650 mg by mouth every 6 (six) hours as needed for mild pain or moderate pain.   aspirin 325 MG EC tablet Take 1 tablet (325 mg total) by mouth daily with breakfast. Start taking on: May 13, 2020   Blue-Emu Hemp 10 % cream Generic drug: trolamine salicylate Apply 1 application topically daily as needed for muscle pain.   cyclobenzaprine 10 MG tablet Commonly known as: FLEXERIL Take 1 tablet (10 mg total) by mouth 3  (three) times daily as needed for muscle spasms.   docusate sodium 100 MG capsule Commonly known as: COLACE Take 1 capsule (100 mg total) by mouth 2 (two) times daily. What changed:   when to take this  reasons to take this   gabapentin 600 MG tablet Commonly known as: NEURONTIN Take 600 mg by mouth at bedtime.   gabapentin 300 MG capsule Commonly known as: NEURONTIN Take 300 mg by mouth daily as needed (pain).   nitroGLYCERIN 0.4 MG SL tablet Commonly known as: NITROSTAT Place 1 tablet (0.4 mg total) under the tongue every 5 (five) minutes as needed for chest pain.   omeprazole 20 MG capsule Commonly known as: PRILOSEC Take 20 mg by mouth daily before breakfast.   oxyCODONE 5 MG immediate release tablet Commonly known as: Oxy IR/ROXICODONE 1 po q 4 hrs prn pain   polyethylene glycol 17 g packet Commonly known as: MIRALAX / GLYCOLAX 17grams in 8 oz of something to drink twice a day until bowel movement.  LAXITIVE.  Restart if two days since last bowel movement   rosuvastatin 10 MG tablet Commonly known as: CRESTOR Take 10 mg by mouth daily.   sildenafil 100 MG tablet Commonly known as: VIAGRA Take 100 mg by mouth daily as needed for erectile dysfunction.   tadalafil 5 MG tablet Commonly known as: CIALIS Take 5 mg by mouth daily as needed for erectile dysfunction.            Discharge Care Instructions  (From admission, onward)         Start     Ordered   05/12/20 0000  Change dressing       Comments: DO NOT REMOVE BANDAGE OVER SURGICAL INCISION.  [place] WHOLE LEG INCLUDING OVER THE WATERPROOF BANDAGE WITH SOAP AND WATER EVERY DAY.   05/12/20 0932          Diagnostic Studies: No results found.  Disposition: Discharge disposition: 01-Home or Self Care       Discharge Instructions    CPM   Complete by: As directed    Continuous passive motion machine (CPM):      Use the CPM from 0 to 90 for 6 hours per day.       You may break it up into 2 or  3 sessions per day.      Use CPM for 2 weeks or until you are told to stop.   Call MD / Call 911   Complete by: As directed    If you experience chest pain or shortness of breath, CALL 911 and be transported to the hospital emergency room.  If you develope a fever above 101 F, pus (  white drainage) or increased drainage or redness at the wound, or calf pain, call your surgeon's office.   Change dressing   Complete by: As directed    DO NOT REMOVE BANDAGE OVER SURGICAL INCISION.  [place] WHOLE LEG INCLUDING OVER THE WATERPROOF BANDAGE WITH SOAP AND WATER EVERY DAY.   Constipation Prevention   Complete by: As directed    Drink plenty of fluids.  Prune juice may be helpful.  You may use a stool softener, such as Colace (over the counter) 100 mg twice a day.  Use MiraLax (over the counter) for constipation as needed.   Diet - low sodium heart healthy   Complete by: As directed    Discharge instructions   Complete by: As directed    INSTRUCTIONS AFTER JOINT REPLACEMENT   Remove items at home which could result in a fall. This includes throw rugs or furniture in walking pathways You may notice swelling that will progress down to the foot and ankle.  This is normal after surgery.  Elevate your leg when you are not up walking on it.   Continue to use the breathing machine you got in the hospital (incentive spirometer) which will help keep your temperature down.  It is common for your temperature to cycle up and down following surgery, especially at night when you are not up moving around and exerting yourself.  The breathing machine keeps your lungs expanded and your temperature down.   DIET:  As you were doing prior to hospitalization, we recommend a well-balanced diet.  DRESSING / WOUND CARE / SHOWERING  Keep the surgical dressing until follow up.  The dressing is water proof, so you can shower without any extra covering.  IF THE DRESSING FALLS OFF or the wound gets wet inside, change the dressing  with sterile gauze.  Please use good hand washing techniques before changing the dressing.  Do not use any lotions or creams on the incision until instructed by your surgeon.    ACTIVITY  Increase activity slowly as tolerated, but follow the weight bearing instructions below.   No driving for 6 weeks or until further direction given by your physician.  You cannot drive while taking narcotics.  No lifting or carrying greater than 10 lbs. until further directed by your surgeon. Avoid periods of inactivity such as sitting longer than an hour when not asleep. This helps prevent blood clots.  You may return to work once you are authorized by your doctor.     WEIGHT BEARING   Weight bearing as tolerated with assist device (walker, cane, etc) as directed, use it as long as suggested by your surgeon or therapist, typically at least 2-3 weeks.   EXERCISES  Results after joint replacement surgery are often greatly improved when you follow the exercise, range of motion and muscle strengthening exercises prescribed by your doctor. Safety measures are also important to protect the joint from further injury. Any time any of these exercises cause you to have increased pain or swelling, decrease what you are doing until you are comfortable again and then slowly increase them. If you have problems or questions, call your caregiver or physical therapist for advice.   Rehabilitation is important following a joint replacement. After just a few days of immobilization, the muscles of the leg can become weakened and shrink (atrophy).  These exercises are designed to build up the tone and strength of the thigh and leg muscles and to improve motion. Often times heat used for twenty  to thirty minutes before working out will loosen up your tissues and help with improving the range of motion but do not use heat for the first two weeks following surgery (sometimes heat can increase post-operative swelling).   These  exercises can be done on a training (exercise) mat, on the floor, on a table or on a bed. Use whatever works the best and is most comfortable for you.    Use music or television while you are exercising so that the exercises are a pleasant break in your day. This will make your life better with the exercises acting as a break in your routine that you can look forward to.   Perform all exercises about fifteen times, three times per day or as directed.  You should exercise both the operative leg and the other leg as well.   Exercises include:  Quad Sets - Tighten up the muscle on the front of the thigh (Quad) and hold for 5-10 seconds.   Straight Leg Raises - With your knee straight (if you were given a brace, keep it on), lift the leg to 60 degrees, hold for 3 seconds, and slowly lower the leg.  Perform this exercise against resistance later as your leg gets stronger.  Leg Slides: Lying on your back, slowly slide your foot toward your buttocks, bending your knee up off the floor (only go as far as is comfortable). Then slowly slide your foot back down until your leg is flat on the floor again.  Angel Wings: Lying on your back spread your legs to the side as far apart as you can without causing discomfort.  Hamstring Strength:  Lying on your back, push your heel against the floor with your leg straight by tightening up the muscles of your buttocks.  Repeat, but this time bend your knee to a comfortable angle, and push your heel against the floor.  You may put a pillow under the heel to make it more comfortable if necessary.   A rehabilitation program following joint replacement surgery can speed recovery and prevent re-injury in the future due to weakened muscles. Contact your doctor or a physical therapist for more information on knee rehabilitation.    CONSTIPATION  Constipation is defined medically as fewer than three stools per week and severe constipation as less than one stool per week.  Even if  you have a regular bowel pattern at home, your normal regimen is likely to be disrupted due to multiple reasons following surgery.  Combination of anesthesia, postoperative narcotics, change in appetite and fluid intake all can affect your bowels.   YOU MUST use at least one of the following options; they are listed in order of increasing strength to get the job done.  They are all available over the counter, and you may need to use some, POSSIBLY even all of these options:    Drink plenty of fluids (prune juice may be helpful) and high fiber foods Colace 100 mg by mouth twice a day  Senokot for constipation as directed and as needed Dulcolax (bisacodyl), take with full glass of water  Miralax (polyethylene glycol) once or twice a day as needed.  If you have tried all these things and are unable to have a bowel movement in the first 3-4 days after surgery call either your surgeon or your primary doctor.    If you experience loose stools or diarrhea, hold the medications until you stool forms back up.  If your symptoms do not get  better within 1 week or if they get worse, check with your doctor.  If you experience "the worst abdominal pain ever" or develop nausea or vomiting, please contact the office immediately for further recommendations for treatment.   ITCHING:  If you experience itching with your medications, try taking only a single pain pill, or even half a pain pill at a time.  You can also use Benadryl over the counter for itching or also to help with sleep.   TED HOSE STOCKINGS:  Use stockings on both legs until for at least 2 weeks or as directed by physician office. They may be removed at night for sleeping.  MEDICATIONS:  See your medication summary on the "After Visit Summary" that nursing will review with you.  You may have some home medications which will be placed on hold until you complete the course of blood thinner medication.  It is important for you to complete the blood  thinner medication as prescribed.  PRECAUTIONS:  If you experience chest pain or shortness of breath - call 911 immediately for transfer to the hospital emergency department.   If you develop a fever greater that 101 F, purulent drainage from wound, increased redness or drainage from wound, foul odor from the wound/dressing, or calf pain - CONTACT YOUR SURGEON.                                                   FOLLOW-UP APPOINTMENTS:  If you do not already have a post-op appointment, please call the office for an appointment to be seen by your surgeon.  Guidelines for how soon to be seen are listed in your "After Visit Summary", but are typically between 1-4 weeks after surgery.  OTHER INSTRUCTIONS:   Knee Replacement:  Do not place pillow under knee, focus on keeping the knee straight while resting. CPM instructions: 0-90 degrees, 2 hours in the morning, 2 hours in the afternoon, and 2 hours in the evening. Place foam block, curve side up under heel at all times except when in CPM or when walking.  DO NOT modify, tear, cut, or change the foam block in any way.  MAKE SURE YOU:  Understand these instructions.  Get help right away if you are not doing well or get worse.   Dental Antibiotics:  In most cases prophylactic antibiotics for Dental procdeures after total joint surgery are not necessary.  Exceptions are as follows:  1. History of prior total joint infection  2. Severely immunocompromised (Organ Transplant, cancer chemotherapy, Rheumatoid biologic meds such as [place])  3. Poorly controlled diabetes (A1C &gt; 8.0, blood glucose over 200)  If you have one of these conditions, contact your surgeon for an antibiotic prescription, prior to your dental procedure.    Thank you for letting us be a part of your medical care team.  It is a privilege we respect greatly.  We hope these instructions will help you stay on track for a fast and full recovery!   Do not put a pillow under the  knee. Place it under the heel.   Complete by: As directed    Place gray foam block, curve side up under heel at all times except when in CPM or when walking.  DO NOT modify, tear, cut, or change in any way the gray foam block.   Increase activity  slowly as tolerated   Complete by: As directed    Patient may shower   Complete by: As directed    Aquacel dressing is water proof    Wash over it and the whole leg with soap and water at the end of your shower   TED hose   Complete by: As directed    Use stockings (TED hose) for 2 weeks on both leg(s).  You may remove them at night for sleeping.       Follow-up Information    Elsie Saas, MD. Go on 05/21/2020.   Specialty: Orthopedic Surgery Why: Your appoinment is scheduled for 11:15.  Contact information: [address] [place] [address] [phone]        [facility]. Go on 05/13/2020.   Why: You are scheduled to start therapy at 10:45  Contact information: [place], [place]               Signed: Linda Hedges 05/12/2020, 9:32 AM

## 2020-05-12 NOTE — Plan of Care (Signed)
  Problem: Pain Management: Goal: Pain level will decrease with appropriate interventions Outcome: Progressing   Problem: Education: Goal: Knowledge of General Education information will improve Description: Including pain rating scale, medication(s)/side effects and non-pharmacologic comfort measures Outcome: Progressing   Problem: Pain Managment: Goal: General experience of comfort will improve Outcome: Progressing

## 2020-05-12 NOTE — Progress Notes (Signed)
Physical Therapy Treatment Patient Details Name: Patrick Mathews MRN: [id number] DOB: [birth year] Today's Date: 05/12/2020    History of Present Illness Patient is 69 y.o. male s/p Rt TKA on 05/11/20 with PMH significant for HLD, HTN, GERD, CAD, OA, asthma, Lt TKA, lumbar and cervical spine surgeries.     PT Comments    POD# 1 am session Performed all TE's following HEP handout 5 reps each with instruction on proper tech and freq.  Instructed pt to perform HEP on days not attending OP PT.  Assisted with amb in hallway a short distance.  Pt amb with PA earlier.  Also did not perform stair training as pt performed well with PA and pt verbally stated correct sequencing.  Pt ready for D/C to homwe with family support.     Follow Up Recommendations  Outpatient PT     Equipment Recommendations  None recommended by PT    Recommendations for Other Services       Precautions / Restrictions Precautions Precautions: Fall Precaution Comments: instructed no pillow under knee Restrictions Weight Bearing Restrictions: No RLE Weight Bearing: Weight bearing as tolerated    Mobility  Bed Mobility               General bed mobility comments: OOB in recliner  Transfers Overall transfer level: Needs assistance Equipment used: Rolling walker (2 wheeled) Transfers: Sit to/from Stand Sit to Stand: Supervision         General transfer comment: cues for technique with RW, light assist to initiate power up and guarding during rise for safety  Ambulation/Gait Ambulation/Gait assistance: Supervision;Min guard Gait Distance (Feet): 55 Feet Assistive device: Rolling walker (2 wheeled) Gait Pattern/deviations: Step-to pattern Gait velocity: decreased   General Gait Details: < 25% VC's for safe step pattern and RW position. no overt LOB or buckling at Rt knee.  Tolerated amb full unit with PA earlier this morning.   Stairs             Wheelchair Mobility    Modified Rankin  (Stroke Patients Only)       Balance                                            Cognition Arousal/Alertness: Awake/alert Behavior During Therapy: WFL for tasks assessed/performed Overall Cognitive Status: Within Functional Limits for tasks assessed                                 General Comments: AxO x 4 very pleasant      Exercises   Total Knee Replacement TE's following HEP handout 10 reps B LE ankle pumps 05 reps towel squeezes 05 reps knee presses 05 reps heel slides  05 reps SAQ's 05 reps SLR's 05 reps ABD Educated on use of gait belt to assist with TE's Followed by ICE   General Comments        Pertinent Vitals/Pain Pain Assessment: 0-10 Pain Score: 5  Pain Location: Rt knee Pain Descriptors / Indicators: Aching;Discomfort;Sore Pain Intervention(s): Monitored during session;Premedicated before session;Repositioned    Home Living                      Prior Function            PT Goals (current goals can now be  found in the care plan section) Progress towards PT goals: Progressing toward goals    Frequency    7X/week      PT Plan Current plan remains appropriate    Co-evaluation              AM-PAC PT "6 Clicks" Mobility   Outcome Measure  Help needed turning from your back to your side while in a flat bed without using bedrails?: None Help needed moving from lying on your back to sitting on the side of a flat bed without using bedrails?: None Help needed moving to and from a bed to a chair (including a wheelchair)?: None Help needed standing up from a chair using your arms (e.g., wheelchair or bedside chair)?: None Help needed to walk in hospital room?: None Help needed climbing 3-5 steps with a railing? : A Little 6 Click Score: 23    End of Session Equipment Utilized During Treatment: Gait belt Activity Tolerance: Patient tolerated treatment well Patient left: in chair;with call  bell/phone within reach;with chair alarm set;with family/visitor present Nurse Communication: Mobility status (pt ready for D/C after one session) PT Visit Diagnosis: Muscle weakness (generalized) (M62.81);Difficulty in walking, not elsewhere classified (R26.2)     Time: 0935-1000 PT Time Calculation (min) (ACUTE ONLY): 25 min  Charges:  $Gait Training: 8-22 mins $Therapeutic Exercise: 8-22 mins                     Rica Koyanagi  PTA Acute  Rehabilitation Services Pager      [phone] Office      [phone]

## 2020-05-13 ENCOUNTER — Ambulatory Visit (HOSPITAL_COMMUNITY): Payer: Medicare Other | Attending: Orthopedic Surgery | Admitting: Physical Therapy

## 2020-05-13 ENCOUNTER — Encounter (HOSPITAL_COMMUNITY): Payer: Self-pay | Admitting: Physical Therapy

## 2020-05-13 ENCOUNTER — Other Ambulatory Visit: Payer: Self-pay

## 2020-05-13 DIAGNOSIS — M6281 Muscle weakness (generalized): Secondary | ICD-10-CM | POA: Diagnosis not present

## 2020-05-13 DIAGNOSIS — R262 Difficulty in walking, not elsewhere classified: Secondary | ICD-10-CM

## 2020-05-13 DIAGNOSIS — R2689 Other abnormalities of gait and mobility: Secondary | ICD-10-CM

## 2020-05-13 NOTE — Therapy (Signed)
[facility] [address] [place], [place], [address] Phone: [phone]   Fax:  [phone]  Physical Therapy Evaluation  Patient Details  Name: Patrick Mathews MRN: [id number] Date of Birth: April 01, 1951 Referring Provider (PT): Audree Camel. Noemi Chapel   Encounter Date: 05/13/2020   PT End of Session - 05/13/20 1218    Visit Number 1    Number of Visits 18    Date for PT Re-Evaluation 06/24/20    Authorization Type medicare primary, [employer] secondary. follow medicare, no auth required    Progress Note Due on Visit 10    PT Start Time 1046    PT Stop Time 1130    PT Time Calculation (min) 44 min    Equipment Utilized During Treatment Gait belt    Activity Tolerance Patient limited by pain    Behavior During Therapy WFL for tasks assessed/performed           Past Medical History:  Diagnosis Date   Adjacent segment disease with spinal stenosis    Arthritis    "knees; back" (07/14/2014)   Asthma    CAD (coronary artery disease)    Chest pain    Exertional dyspnea    GERD (gastroesophageal reflux disease)    Hyperlipidemia    Hypertension    Pneumonia 11/2013   Primary localized osteoarthritis of left knee    Primary localized osteoarthritis of right knee 05/04/2020   Seasonal allergies     Past Surgical History:  Procedure Laterality Date   ANTERIOR CERVICAL DECOMP/DISCECTOMY FUSION  2009   Dr Evlyn Kanner SURGERY     BUNIONECTOMY Right  2013   COLONOSCOPY N/A 11/06/2012   Procedure: COLONOSCOPY;  Surgeon: Jamesetta So, MD;  [facility];  Service: Gastroenterology;  Laterality: N/A;   FOOT SURGERY     JOINT REPLACEMENT     KNEE ARTHROSCOPY Bilateral 77's   POSTERIOR LUMBAR FUSION  1985; 2009   Dr Joya Salm   TONSILLECTOMY  1950's   TOTAL KNEE ARTHROPLASTY Left 07/14/2014   TOTAL KNEE ARTHROPLASTY Left 07/14/2014   Procedure: LEFT TOTAL KNEE ARTHROPLASTY;  Surgeon: [name], MD;   Location: [facility];  Service: Orthopedics;  Laterality: Left;   TOTAL KNEE ARTHROPLASTY Right 05/11/2020   Procedure: TOTAL KNEE ARTHROPLASTY;  Surgeon: Elsie Saas, MD;  Location: [facility];  Service: Orthopedics;  Laterality: Right;   WISDOM TOOTH EXTRACTION      There were no vitals filed for this visit.    Subjective Assessment - 05/13/20 1056    Subjective Patient s/p R TKA on 05/11/20. Reports prior to the surgery he was having intense pain in his knee and was walking up to 2 miles but that it was a struggle. States he also had a lumbar fusion about 2 months ago. States last night was rough, he barely slept. His son, Vonna Kotyk, States that they didnt take meds last night. Patient in a lot of pain but also reports he is concerned about taking pain medications. Reports he had a left knee replacement about 5 years ago and it did well enough. Son and daughter are currently helping as wife is in quarantine.    Patient is accompained by: Family member    Pertinent History lumbar fusion earlier this year    Limitations Standing;Lifting;Walking;House hold activities    How long can you sit comfortably? no difficulty    How long can you stand comfortably? a couple minutes    How  long can you walk comfortably? a couple minutres    Patient Stated Goals to be able to walk without pain    Currently in Pain? Yes    Pain Score 8     Pain Location Knee    Pain Orientation Right    Pain Descriptors / Indicators Sharp;Stabbing    Pain Type Surgical pain    Pain Frequency Intermittent    Aggravating Factors  walking, bending    Pain Relieving Factors ice, rest              [facility] PT Assessment - 05/13/20 0001      Assessment   Medical Diagnosis R TKA    Referring Provider (PT) Audree Camel. Wainer    Onset Date/Surgical Date 05/11/20    Next MD Visit 05/20/20      Restrictions   Weight Bearing Restrictions Yes    RLE Weight Bearing Weight bearing as tolerated      Balance Screen   Has the  patient fallen in the past 6 months No      [place] residence    Living Arrangements Spouse/significant other    Available Help at Discharge Family    Type of [place] Access Level entry    [place] Two level   split level   Alternate Level Stairs-Number of Steps 8    Alternate Level Stairs-Rails Can reach both    [place] - 2 wheels      Prior Function   Level of Independence Independent    Vocation Retired    Leisure --   Building control surveyor   Overall Cognitive Status Within Functional Limits for tasks assessed    Attention Focused    Focused Attention --    Memory --    Awareness --    Problem Solving --    Executive Function --    Reasoning --      Observation/Other Assessments   Focus on Therapeutic Outcomes (FOTO)  4% function      Observation/Other Assessments-Edema    Edema --   throughout entire R lower leg     ROM / Strength   AROM / PROM / Strength AROM;PROM;Strength      PROM   Right/Left Knee Right    Right Knee Extension 15   lacking   Right Knee Flexion 60   84 w/ activity     Strength   Overall Strength Unable to assess   pain/guarding     Palpation   Palpation comment tenderness to palpation surrounding Right knee joint      Special Tests   Other special tests negative homan's sign      Transfers   Transfers Sit to Stand;Stand to Sit    Sit to Stand With upper extremity assist;6: Modified independent (Device/Increase time)   with R leg kicked out    Stand to Sit 6: Modified independent (Device/Increase time);With upper extremity assist   with R leg kicked out      Ambulation/Gait   Ambulation/Gait Yes    Ambulation/Gait Assistance 5: Supervision    Ambulation/Gait Assistance Details 10    Ambulation Distance (Feet) 150 Feet    Assistive device Rolling walker    Gait Pattern Decreased stride length;Antalgic;Step-to pattern;Decreased hip/knee flexion - right;Decreased  dorsiflexion - right;Decreased weight shift to right;Lateral trunk lean to left;Trunk flexed    Ambulation Surface Level    Gait velocity  decreased    Curb 4: Min assist   per report from [employer] Details (indicate cue type and reason) --   physical support from caregiver, use of RW                     Objective measurements completed on examination: See above findings.       [facility] Adult PT Treatment/Exercise - 05/13/20 0001      Exercises   Exercises Knee/Hip   pt instructed in AAROM for R knee flexion-extension     Knee/Hip Exercises: Supine   Heel Slides AAROM;Right;10 reps   with strap     Manual Therapy   Manual Therapy Edema management    Manual therapy comments manual therapy independent of all other interventions    Edema Management performed to R knee to reduce swelling                  PT Education - 05/13/20 1127    Education Details education in elevation/positioning and light massage to improve edema managment. educated pateint on following MD recomendations on pain medications, discussed following up with MD abotu meds with questions. on HEP    Person(s) Educated Patient;Child(ren)    Methods Explanation;Demonstration    Comprehension Verbalized understanding;Returned demonstration            PT Short Term Goals - 05/13/20 1134      PT SHORT TERM GOAL #1   Title Patient will demonstrate increased knee extension to lacking 5 degrees to increased stride length.    Baseline lacking 15 degrees    Time 3    Period Weeks    Status New    Target Date 06/03/20      PT SHORT TERM GOAL #2   Title Patient will demonstrate increased knee flexion of 115 degrees    Baseline 60-84 with AAROM using belt    Time 3    Period Weeks    Status New    Target Date 06/03/20      PT SHORT TERM GOAL #3   Title Patient will demonstrate 100% HEP recall for R knee ROM/strengthening exercises to improve independent self-progression    Baseline in  progress    Time 3    Period Weeks    Status New    Target Date 06/03/20      PT SHORT TERM GOAL #4   Title Patient will be able to ambulate 300 ft with SPC to reduce need for AD to increase independence    Baseline RW with heavy, antalgic 3-point pattern    Time 3    Period Weeks    Status New             PT Long Term Goals - 05/13/20 1143      PT LONG TERM GOAL #1   Title Patient will demonstrate increased knee extension to 0 degrees to increased stride length    Baseline lacking 15    Time 6    Period Weeks    Status New    Target Date 06/24/20      PT LONG TERM GOAL #2   Title Patient will demonstrate increased knee flexion of 120 degrees to be able to squat to the ground to work in garden    Baseline 60 degrees R knee flexion    Time 6    Period Weeks    Status New    Target Date 06/24/20  PT LONG TERM GOAL #3   Title Patient will demonstrate independent ambulation over various surfaces to reduce use of AD    Baseline RW with 3 point pattern, level surfaces    Time 6    Period Weeks    Status New    Target Date 06/24/20      PT LONG TERM GOAL #4   Title Patient will demonstrate modified independent stair ambulation with single HR and reciprocal pattern to improve independence and safety with home environment    Baseline CGA with non-reciprocal pattern    Time 6    Period Weeks    Status New    Target Date 06/24/20      PT LONG TERM GOAL #5   Title Patient will improve on FOTO score to meet predicted outcomes to demonstrate improved functional mobility.                  Plan - 05/13/20 1130    Clinical Impression Statement Patient is s/p right TKA on 05/11/20 and presents with swelling, pain and limitations in functional ROM. Session focused on heavy education secondary to patient and son concerns about medication. Answered all questions and instructed patient to follow up with MD about any specific medication concerns. Patient would greatly  benefit from skilled physical therapy to improve functional mobility and return him to optimal function.    Personal Factors and Comorbidities Comorbidity 2;Comorbidity 1    Comorbidities hx of cervical and lumbar surgery 9lumbar fusion august of this year)    Examination-Activity Limitations Bed Mobility;Bend;Carry;Dressing;Locomotion Level;Squat;Stairs;Stand;Toileting;Transfers    Examination-Participation Restrictions Community Activity;Driving;Laundry;Shop;Yard Work    Stability/Clinical Decision Making Stable/Uncomplicated    Clinical Decision Making Low    Rehab Potential Excellent    PT Frequency 3x / week    PT Duration 6 weeks    PT Treatment/Interventions Electrical Stimulation;Moist Heat;Ultrasound;DME Instruction;Gait training;Stair training;Functional mobility training;Therapeutic activities;Therapeutic exercise;Balance training;Patient/family education;Manual techniques;Passive range of motion;Taping;ADLs/Self Care Home Management;Neuromuscular re-education;Scar mobilization;Dry needling;Joint Manipulations    PT Next Visit Plan Increase ROM and strengthening exercises for R knee to increase HEP development    PT Home Exercise Plan 12/1: Heel slides and seated knee flexion stretch    Consulted and Agree with Plan of Care Patient;Family member/caregiver    Family Member Consulted Son Vonna Kotyk           Patient will benefit from skilled therapeutic intervention in order to improve the following deficits and impairments:  Abnormal gait, Decreased activity tolerance, Decreased balance, Decreased coordination, Decreased endurance, Decreased mobility, Decreased range of motion, Decreased strength, Difficulty walking, Pain, Decreased skin integrity, Increased edema, Decreased scar mobility  Visit Diagnosis: Difficulty in walking, not elsewhere classified - Plan: PT plan of care cert/re-cert  Muscle weakness (generalized) - Plan: PT plan of care cert/re-cert  Other abnormalities of  gait and mobility - Plan: PT plan of care cert/re-cert     Problem List Patient Active Problem List   Diagnosis Date Noted   Primary localized osteoarthritis of right knee 05/04/2020   Lumbar adjacent segment disease with spondylolisthesis 02/06/2020   Chest pressure 09/27/2019   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   Morbid obesity ([place]) 05/08/2015   DJD (degenerative joint disease) of knee 07/14/2014   Acute upper respiratory infection 06/12/2014   Cigarette smoker 06/12/2014   Cough 06/12/2014   Primary localized osteoarthritis of left knee    Hyperlipidemia    GERD (gastroesophageal reflux disease)    Back pain  Dyspnea 05/07/2014   Multiple pulmonary nodules 05/07/2014   1:25 PM, 05/13/20 Jerene Pitch, DPT Physical Therapy with [facility]  [phone] office  [facility] [address] [place], [place], [address] Phone: [phone]   Fax:  [phone]  Name: Patrick Mathews MRN: [id number] Date of Birth: December 17, 1950

## 2020-05-13 NOTE — Patient Instructions (Signed)
Access Code: [email or web address] URL: [email or web address] Date: 05/13/2020 Prepared by: Yetta Glassman  Exercises Supine Heel Slide with Strap - 3 x daily - 7 x weekly - 3 sets - 10 reps - 3-5 sec hold Seated Knee Flexion Extension AROM - 3 x daily - 7 x weekly - 3 sets - 10 reps - 3-5 hold

## 2020-05-15 ENCOUNTER — Ambulatory Visit (HOSPITAL_COMMUNITY): Payer: Medicare Other

## 2020-05-15 ENCOUNTER — Encounter (HOSPITAL_COMMUNITY): Payer: Self-pay

## 2020-05-15 ENCOUNTER — Other Ambulatory Visit: Payer: Self-pay

## 2020-05-15 DIAGNOSIS — R2689 Other abnormalities of gait and mobility: Secondary | ICD-10-CM | POA: Diagnosis not present

## 2020-05-15 DIAGNOSIS — R262 Difficulty in walking, not elsewhere classified: Secondary | ICD-10-CM

## 2020-05-15 DIAGNOSIS — M6281 Muscle weakness (generalized): Secondary | ICD-10-CM

## 2020-05-15 NOTE — Therapy (Signed)
[facility] [place], [place], [address] Phone: [phone]   Fax:  [phone]  Physical Therapy Treatment  Patient Details  Name: Patrick Mathews MRN: [id number] Date of Birth: 23-Jan-1951 Referring Provider (PT): Audree Camel. Noemi Chapel   Encounter Date: 05/15/2020   PT End of Session - 05/15/20 1540    Visit Number 2    Number of Visits 18    Date for PT Re-Evaluation 06/24/20    Authorization Type medicare primary, [employer] secondary. follow medicare, no auth required    Progress Note Due on Visit 10    PT Start Time 1533    PT Stop Time 1614    PT Time Calculation (min) 41 min    Activity Tolerance Patient tolerated treatment well;Patient limited by pain;No increased pain    Behavior During Therapy WFL for tasks assessed/performed           Past Medical History:  Diagnosis Date  . Adjacent segment disease with spinal stenosis   . Arthritis    "knees; back" (07/14/2014)  . Asthma   . CAD (coronary artery disease)   . Chest pain   . Exertional dyspnea   . GERD (gastroesophageal reflux disease)   . Hyperlipidemia   . Hypertension   . Pneumonia 11/2013  . Primary localized osteoarthritis of left knee   . Primary localized osteoarthritis of right knee 05/04/2020  . Seasonal allergies     Past Surgical History:  Procedure Laterality Date  . ANTERIOR CERVICAL DECOMP/DISCECTOMY FUSION  2009   Dr Joya Salm  . BACK SURGERY    . BUNIONECTOMY Right  2013  . COLONOSCOPY N/A 11/06/2012   Procedure: COLONOSCOPY;  Surgeon: Jamesetta So, MD;  [facility];  Service: Gastroenterology;  Laterality: N/A;  . FOOT SURGERY    . JOINT REPLACEMENT    . KNEE ARTHROSCOPY Bilateral 1990's  . [place]; 2009   Dr Joya Salm  . TONSILLECTOMY  1950's  . TOTAL KNEE ARTHROPLASTY Left 07/14/2014  . TOTAL KNEE ARTHROPLASTY Left 07/14/2014   Procedure: LEFT TOTAL KNEE ARTHROPLASTY;  Surgeon: [name], MD;   Location: [facility];  Service: Orthopedics;  Laterality: Left;  . TOTAL KNEE ARTHROPLASTY Right 05/11/2020   Procedure: TOTAL KNEE ARTHROPLASTY;  Surgeon: Elsie Saas, MD;  Location[facility];  Service: Orthopedics;  Laterality: Right;  . WISDOM TOOTH EXTRACTION      There were no vitals filed for this visit.   Subjective Assessment - 05/15/20 1538    Subjective Pt c/o knee tightness.  Arrived ambulating with RW and wearing compression hose Rt LE.  Pain scale 6-7/10 today.  Reports compliance with current HEP.    Patient Stated Goals to be able to walk without pain    Currently in Pain? Yes    Pain Score 7     Pain Location Knee    Pain Orientation Right    Pain Descriptors / Indicators Tightness;Sore;Aching    Pain Type Surgical pain    Pain Onset 1 to 4 weeks ago    Pain Frequency Intermittent    Aggravating Factors  walking, bending    Pain Relieving Factors ice, rest              [facility] PT Assessment - 05/15/20 0001      Assessment   Medical Diagnosis R TKA    Referring Provider (PT) Audree Camel. Wainer    Onset Date/Surgical Date 05/11/20    Next  MD Visit 05/20/20                         [facility] Adult PT Treatment/Exercise - 05/15/20 0001      Exercises   Exercises Knee/Hip      Knee/Hip Exercises: Stretches   Active Hamstring Stretch 3 reps;30 seconds    Active Hamstring Stretch Limitations supine with hands behind knee      Knee/Hip Exercises: Standing   Gait Training RW 200, cueing to improve heel strike and equal stride length      Knee/Hip Exercises: Supine   Quad Sets 10 reps    Heel Slides 10 reps    Knee Extension AROM    Knee Extension Limitations 10    Knee Flexion AROM    Knee Flexion Limitations 101    Other Supine Knee/Hip Exercises gentle PROM x 2 minutes      Manual Therapy   Manual Therapy Edema management;Joint mobilization    Manual therapy comments manual therapy independent of all other interventions    Edema Management  performed to R knee to reduce swelling    Joint Mobilization patella mobs gentle all directions                  PT Education - 05/15/20 1546    Education Details Reviewed goals, gait training to improve mechanics, educated importance of HEP compliance for maximal benefits completing regularly.  Reviewed RICE techniques for pain and edema control.  Discussed benefits of wearing compression garment during day and remove at night, proper cleansing    Person(s) Educated Patient    Methods Explanation;Demonstration;Handout    Comprehension Verbalized understanding;Returned demonstration            PT Short Term Goals - 05/13/20 1134      PT SHORT TERM GOAL #1   Title Patient will demonstrate increased knee extension to lacking 5 degrees to increased stride length.    Baseline lacking 15 degrees    Time 3    Period Weeks    Status New    Target Date 06/03/20      PT SHORT TERM GOAL #2   Title Patient will demonstrate increased knee flexion of 115 degrees    Baseline 60-84 with AAROM using belt    Time 3    Period Weeks    Status New    Target Date 06/03/20      PT SHORT TERM GOAL #3   Title Patient will demonstrate 100% HEP recall for R knee ROM/strengthening exercises to improve independent self-progression    Baseline in progress    Time 3    Period Weeks    Status New    Target Date 06/03/20      PT SHORT TERM GOAL #4   Title Patient will be able to ambulate 300 ft with SPC to reduce need for AD to increase independence    Baseline RW with heavy, antalgic 3-point pattern    Time 3    Period Weeks    Status New             PT Long Term Goals - 05/13/20 1143      PT LONG TERM GOAL #1   Title Patient will demonstrate increased knee extension to 0 degrees to increased stride length    Baseline lacking 15    Time 6    Period Weeks    Status New    Target Date 06/24/20  PT LONG TERM GOAL #2   Title Patient will demonstrate increased knee flexion of  120 degrees to be able to squat to the ground to work in garden    Baseline 60 degrees R knee flexion    Time 6    Period Weeks    Status New    Target Date 06/24/20      PT LONG TERM GOAL #3   Title Patient will demonstrate independent ambulation over various surfaces to reduce use of AD    Baseline RW with 3 point pattern, level surfaces    Time 6    Period Weeks    Status New    Target Date 06/24/20      PT LONG TERM GOAL #4   Title Patient will demonstrate modified independent stair ambulation with single HR and reciprocal pattern to improve independence and safety with home environment    Baseline CGA with non-reciprocal pattern    Time 6    Period Weeks    Status New    Target Date 06/24/20      PT LONG TERM GOAL #5   Title Patient will improve on FOTO score to meet predicted outcomes to demonstrate improved functional mobility.                 Plan - 05/15/20 1551    Clinical Impression Statement Reviewed goals, educated importance of HEP compliance, additional exercises added this session for knee mobility.  Session focus on knee mobility and improving gait mechanics to improve ankle and knee mobility.  Good patella mobility noted and minimal edema proximal knee.  Pt tolerated well towards session with vast improvements in AROM to 10-101 (was 15-84 degrees initial eval).  Manual edema control, PROM and gentle patella mobs complete with reports of decreased pain and improved gait mechanics at EOS.    Personal Factors and Comorbidities Comorbidity 2;Comorbidity 1    Comorbidities hx of cervical and lumbar surgery 9lumbar fusion august of this year)    Examination-Activity Limitations Bed Mobility;Bend;Carry;Dressing;Locomotion Level;Squat;Stairs;Stand;Toileting;Transfers    Examination-Participation Restrictions Community Activity;Driving;Laundry;Shop;Yard Work    Stability/Clinical Decision Making Stable/Uncomplicated    Clinical Decision Making Low    Rehab  Potential Excellent    PT Frequency 3x / week    PT Duration 6 weeks    PT Treatment/Interventions Electrical Stimulation;Moist Heat;Ultrasound;DME Instruction;Gait training;Stair training;Functional mobility training;Therapeutic activities;Therapeutic exercise;Balance training;Patient/family education;Manual techniques;Passive range of motion;Taping;ADLs/Self Care Home Management;Neuromuscular re-education;Scar mobilization;Dry needling;Joint Manipulations    PT Next Visit Plan Next session add knee drives, SAQ, SLR and rocking on bike.  Increase ROM and strengthening exercises for R knee to increase HEP development    PT Home Exercise Plan 12/1: Heel slides and seated knee flexion stretch; 12/3: quad sets and supine heel slides           Patient will benefit from skilled therapeutic intervention in order to improve the following deficits and impairments:  Abnormal gait, Decreased activity tolerance, Decreased balance, Decreased coordination, Decreased endurance, Decreased mobility, Decreased range of motion, Decreased strength, Difficulty walking, Pain, Decreased skin integrity, Increased edema, Decreased scar mobility  Visit Diagnosis: Difficulty in walking, not elsewhere classified  Muscle weakness (generalized)  Other abnormalities of gait and mobility     Problem List Patient Active Problem List   Diagnosis Date Noted  . Primary localized osteoarthritis of right knee 05/04/2020  . Lumbar adjacent segment disease with spondylolisthesis 02/06/2020  . Chest pressure 09/27/2019  . 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  .  Morbid obesity ([place]) 05/08/2015  . DJD (degenerative joint disease) of knee 07/14/2014  . Acute upper respiratory infection 06/12/2014  . Cigarette smoker 06/12/2014  . Cough 06/12/2014  . Primary localized osteoarthritis of left knee   . Hyperlipidemia   . GERD (gastroesophageal reflux disease)   . Back pain   . Dyspnea  05/07/2014  . Multiple pulmonary nodules 05/07/2014   Ihor Austin, LPTA/CLT; CBIS [phone]  Aldona Lento 05/15/2020, 4:19 PM  [facility] [place], [place], [address] Phone: [phone]   Fax:  [phone]  Name: GALEN RUSSMAN MRN: [id number] Date of Birth: 1951-06-04

## 2020-05-15 NOTE — Patient Instructions (Signed)
Quad Sets    Squeeze pelvic floor and hold. Tighten top of left thigh. Hold for 5 seconds. Relax for 10 seconds.  Repeat 10 times. Do 2 times a day.   Copyright  VHI. All rights reserved.   Heel Slides    Squeeze pelvic floor and hold. Slide left heel along bed towards bottom.  Hold for 10 seconds. Slide back to flat knee position. Repeat 10 times. Do 2 times a day.  Copyright  VHI. All rights reserved.

## 2020-05-18 ENCOUNTER — Ambulatory Visit (HOSPITAL_COMMUNITY): Payer: Medicare Other | Admitting: Physical Therapy

## 2020-05-18 ENCOUNTER — Other Ambulatory Visit: Payer: Self-pay

## 2020-05-18 ENCOUNTER — Encounter (HOSPITAL_COMMUNITY): Payer: Self-pay | Admitting: Physical Therapy

## 2020-05-18 DIAGNOSIS — R262 Difficulty in walking, not elsewhere classified: Secondary | ICD-10-CM | POA: Diagnosis not present

## 2020-05-18 DIAGNOSIS — M6281 Muscle weakness (generalized): Secondary | ICD-10-CM

## 2020-05-18 DIAGNOSIS — R2689 Other abnormalities of gait and mobility: Secondary | ICD-10-CM | POA: Diagnosis not present

## 2020-05-18 NOTE — Therapy (Signed)
[place], [place], [address] Phone: [phone]   Fax:  [phone]  Physical Therapy Treatment  Patient Details  Name: Patrick Mathews MRN: [id number] Date of Birth: [birth year] Referring Provider (PT): Audree Camel. Noemi Chapel   Encounter Date: 05/18/2020   PT End of Session - 05/18/20 1357    Visit Number 3    Number of Visits 18    Date for PT Re-Evaluation 06/24/20    Authorization Type medicare primary, [employer] secondary. follow medicare, no auth required    Progress Note Due on Visit 10    PT Start Time 1400    PT Stop Time 1439    PT Time Calculation (min) 39 min    Activity Tolerance Patient tolerated treatment well;Patient limited by pain;No increased pain    Behavior During Therapy WFL for tasks assessed/performed           Past Medical History:  Diagnosis Date  . Adjacent segment disease with spinal stenosis   . Arthritis    "knees; back" (07/14/2014)  . Asthma   . CAD (coronary artery disease)   . Chest pain   . Exertional dyspnea   . GERD (gastroesophageal reflux disease)   . Hyperlipidemia   . Hypertension   . Pneumonia 11/2013  . Primary localized osteoarthritis of left knee   . Primary localized osteoarthritis of right knee 05/04/2020  . Seasonal allergies     Past Surgical History:  Procedure Laterality Date  . ANTERIOR CERVICAL DECOMP/DISCECTOMY FUSION  2009   Dr Joya Salm  . BACK SURGERY    . BUNIONECTOMY Right  2013  . COLONOSCOPY N/A 11/06/2012   Procedure: COLONOSCOPY;  Surgeon: Jamesetta So, MD;  [facility];  Service: Gastroenterology;  Laterality: N/A;  . FOOT SURGERY    . JOINT REPLACEMENT    . KNEE ARTHROSCOPY Bilateral 1990's  . [place]; 2009   Dr Joya Salm  . TONSILLECTOMY  1950's  . TOTAL KNEE ARTHROPLASTY Left 07/14/2014  . TOTAL KNEE ARTHROPLASTY Left 07/14/2014   Procedure: LEFT TOTAL KNEE ARTHROPLASTY;  Surgeon: [name], MD;   Location: [facility];  Service: Orthopedics;  Laterality: Left;  . TOTAL KNEE ARTHROPLASTY Right 05/11/2020   Procedure: TOTAL KNEE ARTHROPLASTY;  Surgeon: Elsie Saas, MD;  Location[facility];  Service: Orthopedics;  Laterality: Right;  . WISDOM TOOTH EXTRACTION      There were no vitals filed for this visit.   Subjective Assessment - 05/18/20 1359    Subjective He keeps trying to get his knee moving and stretching out. His exercises are going alright.    Patient Stated Goals to be able to walk without pain    Currently in Pain? Yes    Pain Score 7     Pain Location Knee    Pain Orientation Right    Pain Descriptors / Indicators Tightness;Aching    Pain Type Surgical pain    Pain Onset 1 to 4 weeks ago                             [facility] Adult PT Treatment/Exercise - 05/18/20 0001      Knee/Hip Exercises: Stretches   Other Knee/Hip Stretches knee drives on 12 inch box 10 x 10 second hold      Knee/Hip Exercises: Aerobic   Recumbent Bike 5 min rocking for ROM      Knee/Hip Exercises:  Standing   Functional Squat 10 reps    Functional Squat Limitations with UE support      Knee/Hip Exercises: Supine   Quad Sets 5 reps    Quad Sets Limitations 5 second holds    Short Arc Quad Sets Right;1 set;10 reps    Short Arc Quad Sets Limitations 5 second holds    Heel Slides 10 reps    Heel Slides Limitations with strap 10 second    Straight Leg Raises Left;1 set;10 reps    Straight Leg Raises Limitations with cueing for quad set    Knee Extension AROM    Knee Extension Limitations 10    Knee Flexion AROM    Knee Flexion Limitations 102   105 following heel slides                 PT Education - 05/18/20 1358    Education Details Patient educated on HEP, exercise mechanics, using bone foam for improving extension    Person(s) Educated Patient    Methods Explanation;Demonstration    Comprehension Verbalized understanding;Returned demonstration             PT Short Term Goals - 05/13/20 1134      PT SHORT TERM GOAL #1   Title Patient will demonstrate increased knee extension to lacking 5 degrees to increased stride length.    Baseline lacking 15 degrees    Time 3    Period Weeks    Status New    Target Date 06/03/20      PT SHORT TERM GOAL #2   Title Patient will demonstrate increased knee flexion of 115 degrees    Baseline 60-84 with AAROM using belt    Time 3    Period Weeks    Status New    Target Date 06/03/20      PT SHORT TERM GOAL #3   Title Patient will demonstrate 100% HEP recall for R knee ROM/strengthening exercises to improve independent self-progression    Baseline in progress    Time 3    Period Weeks    Status New    Target Date 06/03/20      PT SHORT TERM GOAL #4   Title Patient will be able to ambulate 300 ft with SPC to reduce need for AD to increase independence    Baseline RW with heavy, antalgic 3-point pattern    Time 3    Period Weeks    Status New             PT Long Term Goals - 05/13/20 1143      PT LONG TERM GOAL #1   Title Patient will demonstrate increased knee extension to 0 degrees to increased stride length    Baseline lacking 15    Time 6    Period Weeks    Status New    Target Date 06/24/20      PT LONG TERM GOAL #2   Title Patient will demonstrate increased knee flexion of 120 degrees to be able to squat to the ground to work in garden    Baseline 60 degrees R knee flexion    Time 6    Period Weeks    Status New    Target Date 06/24/20      PT LONG TERM GOAL #3   Title Patient will demonstrate independent ambulation over various surfaces to reduce use of AD    Baseline RW with 3 point pattern, level surfaces    Time  6    Period Weeks    Status New    Target Date 06/24/20      PT LONG TERM GOAL #4   Title Patient will demonstrate modified independent stair ambulation with single HR and reciprocal pattern to improve independence and safety with home environment     Baseline CGA with non-reciprocal pattern    Time 6    Period Weeks    Status New    Target Date 06/24/20      PT LONG TERM GOAL #5   Title Patient will improve on FOTO score to meet predicted outcomes to demonstrate improved functional mobility.                 Plan - 05/18/20 1358    Clinical Impression Statement Added bike at beginning of session today with cueing for holds at end range flexion. Patient's ROM today measured from lacking 10 to 102 following bike. Flexion ROM improves to 105 following heel slides. Patient shows good quad activation with quad sets and is given cueing for quad set with SLR. Patient does not show extensor lag while completing but is lacking TKE ROM. Patient requires verbal cueing for decreasing weight shift with squatting with fair carry over. Patient will continue to benefit from skilled physical therapy in order to reduce impairment and improve function.    Personal Factors and Comorbidities Comorbidity 2;Comorbidity 1    Comorbidities hx of cervical and lumbar surgery 9lumbar fusion august of this year)    Examination-Activity Limitations Bed Mobility;Bend;Carry;Dressing;Locomotion Level;Squat;Stairs;Stand;Toileting;Transfers    Examination-Participation Restrictions Community Activity;Driving;Laundry;Shop;Yard Work    [employer] officer Stable/Uncomplicated    Rehab Potential Excellent    PT Frequency 3x / week    PT Duration 6 weeks    PT Treatment/Interventions Electrical Stimulation;Moist Heat;Ultrasound;DME Instruction;Gait training;Stair training;Functional mobility training;Therapeutic activities;Therapeutic exercise;Balance training;Patient/family education;Manual techniques;Passive range of motion;Taping;ADLs/Self Care Home Management;Neuromuscular re-education;Scar mobilization;Dry needling;Joint Manipulations    PT Next Visit Plan Increase ROM and strengthening exercises for R knee to increase HEP development    PT Home  Exercise Plan 12/1: Heel slides and seated knee flexion stretch; 12/3: quad sets and supine heel slides 12/6 mini squat, heel slides, SLR           Patient will benefit from skilled therapeutic intervention in order to improve the following deficits and impairments:  Abnormal gait, Decreased activity tolerance, Decreased balance, Decreased coordination, Decreased endurance, Decreased mobility, Decreased range of motion, Decreased strength, Difficulty walking, Pain, Decreased skin integrity, Increased edema, Decreased scar mobility  Visit Diagnosis: Difficulty in walking, not elsewhere classified  Muscle weakness (generalized)  Other abnormalities of gait and mobility     Problem List Patient Active Problem List   Diagnosis Date Noted  . Primary localized osteoarthritis of right knee 05/04/2020  . Lumbar adjacent segment disease with spondylolisthesis 02/06/2020  . Chest pressure 09/27/2019  . 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  . Morbid obesity ([place]) 05/08/2015  . DJD (degenerative joint disease) of knee 07/14/2014  . Acute upper respiratory infection 06/12/2014  . Cigarette smoker 06/12/2014  . Cough 06/12/2014  . Primary localized osteoarthritis of left knee   . Hyperlipidemia   . GERD (gastroesophageal reflux disease)   . Back pain   . Dyspnea 05/07/2014  . Multiple pulmonary nodules 05/07/2014   2:44 PM, 05/18/20 Mearl Latin PT, DPT Physical Therapist at [facility] [place],  [place], [address] Phone: [phone]   Fax:  [phone]  Name: Patrick Mathews MRN: [id number] Date of Birth: Jan 14, 1951

## 2020-05-18 NOTE — Patient Instructions (Signed)
Access Code: [email or web address] URL: [email or web address] Date: 05/18/2020 Prepared by: Mitzi Hansen Cadie Sorci  Exercises Supine Heel Slide with Strap - 3 x daily - 7 x weekly - 10 reps - 10 second hold Active Straight Leg Raise with Quad Set - 1 x daily - 7 x weekly - 2 sets - 10 reps Mini Squat with Counter Support - 1 x daily - 7 x weekly - 2 sets - 10 reps

## 2020-05-20 ENCOUNTER — Encounter (HOSPITAL_COMMUNITY): Payer: Self-pay | Admitting: Physical Therapy

## 2020-05-20 ENCOUNTER — Ambulatory Visit (HOSPITAL_COMMUNITY): Payer: Medicare Other | Admitting: Physical Therapy

## 2020-05-20 ENCOUNTER — Other Ambulatory Visit: Payer: Self-pay

## 2020-05-20 DIAGNOSIS — R262 Difficulty in walking, not elsewhere classified: Secondary | ICD-10-CM

## 2020-05-20 DIAGNOSIS — M6281 Muscle weakness (generalized): Secondary | ICD-10-CM

## 2020-05-20 DIAGNOSIS — R2689 Other abnormalities of gait and mobility: Secondary | ICD-10-CM

## 2020-05-20 NOTE — Therapy (Signed)
[facility] [place], [place], [address] Phone: [phone]   Fax:  [phone]  Physical Therapy Treatment  Patient Details  Name: Patrick Mathews MRN: [id number] Date of Birth: 1951-02-26 Referring Provider (PT): Audree Camel. Noemi Chapel   Encounter Date: 05/20/2020   PT End of Session - 05/20/20 1400    Visit Number 4    Number of Visits 18    Date for PT Re-Evaluation 06/24/20    Authorization Type medicare primary, [employer] secondary. follow medicare, no auth required    Progress Note Due on Visit 10    PT Start Time 1352    PT Stop Time 1435    PT Time Calculation (min) 43 min    Activity Tolerance Patient tolerated treatment well;Patient limited by pain;No increased pain    Behavior During Therapy WFL for tasks assessed/performed           Past Medical History:  Diagnosis Date  . Adjacent segment disease with spinal stenosis   . Arthritis    "knees; back" (07/14/2014)  . Asthma   . CAD (coronary artery disease)   . Chest pain   . Exertional dyspnea   . GERD (gastroesophageal reflux disease)   . Hyperlipidemia   . Hypertension   . Pneumonia 11/2013  . Primary localized osteoarthritis of left knee   . Primary localized osteoarthritis of right knee 05/04/2020  . Seasonal allergies     Past Surgical History:  Procedure Laterality Date  . ANTERIOR CERVICAL DECOMP/DISCECTOMY FUSION  2009   Dr Joya Salm  . BACK SURGERY    . BUNIONECTOMY Right  2013  . COLONOSCOPY N/A 11/06/2012   Procedure: COLONOSCOPY;  Surgeon: Jamesetta So, MD;  [facility];  Service: Gastroenterology;  Laterality: N/A;  . FOOT SURGERY    . JOINT REPLACEMENT    . KNEE ARTHROSCOPY Bilateral 1990's  . [place]; 2009   Dr Joya Salm  . TONSILLECTOMY  1950's  . TOTAL KNEE ARTHROPLASTY Left 07/14/2014  . TOTAL KNEE ARTHROPLASTY Left 07/14/2014   Procedure: LEFT TOTAL KNEE ARTHROPLASTY;  Surgeon: [name], MD;   Location: [facility];  Service: Orthopedics;  Laterality: Left;  . TOTAL KNEE ARTHROPLASTY Right 05/11/2020   Procedure: TOTAL KNEE ARTHROPLASTY;  Surgeon: Elsie Saas, MD;  [facility];  Service: Orthopedics;  Laterality: Right;  . WISDOM TOOTH EXTRACTION      There were no vitals filed for this visit.   Subjective Assessment - 05/20/20 1358    Subjective Patient says he is doing a little better. Knee pain about a 5 at rest. Takes one pain pill in AM and one in evening.    Patient Stated Goals to be able to walk without pain    Currently in Pain? Yes    Pain Score 5     Pain Location Knee    Pain Orientation Right    Pain Descriptors / Indicators Aching;Tightness    Pain Type Surgical pain    Pain Onset 1 to 4 weeks ago    Pain Frequency Intermittent                             [facility] Adult PT Treatment/Exercise - 05/20/20 0001      Knee/Hip Exercises: Stretches   Passive Hamstring Stretch Right;5 reps;10 seconds    Passive Hamstring Stretch Limitations with strap (supine)     Knee: Self-Stretch to  increase Flexion Right;5 reps;10 seconds    Knee: Self-Stretch Limitations 12 inch box     Gastroc Stretch Both;3 reps;30 seconds    Gastroc Stretch Limitations slant board      Knee/Hip Exercises: Aerobic   Recumbent Bike 5 min seat 12 rocking for ROM      Knee/Hip Exercises: Standing   Heel Raises Both;15 reps    Other Standing Knee Exercises tandem stance 2 x 30"       Knee/Hip Exercises: Seated   Sit to Sand 1 set;15 reps;without UE support      Knee/Hip Exercises: Supine   Quad Sets Right;10 reps    Quad Sets Limitations 5"     Heel Slides 10 reps    Heel Slides Limitations with strap 10 second holds     Straight Leg Raises 1 set;10 reps;Right    Knee Extension AROM    Knee Extension Limitations 8    Knee Flexion AROM    Knee Flexion Limitations 107    Other Supine Knee/Hip Exercises RT knee extension with therapist overpressure 5 x 10"                      PT Short Term Goals - 05/13/20 1134      PT SHORT TERM GOAL #1   Title Patient will demonstrate increased knee extension to lacking 5 degrees to increased stride length.    Baseline lacking 15 degrees    Time 3    Period Weeks    Status New    Target Date 06/03/20      PT SHORT TERM GOAL #2   Title Patient will demonstrate increased knee flexion of 115 degrees    Baseline 60-84 with AAROM using belt    Time 3    Period Weeks    Status New    Target Date 06/03/20      PT SHORT TERM GOAL #3   Title Patient will demonstrate 100% HEP recall for R knee ROM/strengthening exercises to improve independent self-progression    Baseline in progress    Time 3    Period Weeks    Status New    Target Date 06/03/20      PT SHORT TERM GOAL #4   Title Patient will be able to ambulate 300 ft with SPC to reduce need for AD to increase independence    Baseline RW with heavy, antalgic 3-point pattern    Time 3    Period Weeks    Status New             PT Long Term Goals - 05/13/20 1143      PT LONG TERM GOAL #1   Title Patient will demonstrate increased knee extension to 0 degrees to increased stride length    Baseline lacking 15    Time 6    Period Weeks    Status New    Target Date 06/24/20      PT LONG TERM GOAL #2   Title Patient will demonstrate increased knee flexion of 120 degrees to be able to squat to the ground to work in garden    Baseline 60 degrees R knee flexion    Time 6    Period Weeks    Status New    Target Date 06/24/20      PT LONG TERM GOAL #3   Title Patient will demonstrate independent ambulation over various surfaces to reduce use of AD    Baseline RW  with 3 point pattern, level surfaces    Time 6    Period Weeks    Status New    Target Date 06/24/20      PT LONG TERM GOAL #4   Title Patient will demonstrate modified independent stair ambulation with single HR and reciprocal pattern to improve independence and safety with  home environment    Baseline CGA with non-reciprocal pattern    Time 6    Period Weeks    Status New    Target Date 06/24/20      PT LONG TERM GOAL #5   Title Patient will improve on FOTO score to meet predicted outcomes to demonstrate improved functional mobility.                 Plan - 05/20/20 1643    Clinical Impression Statement Patient tolerated session well today. Patient shows good effort with exercises. Patient progressing well toward AROM goals. Added standing heel raise and calf stretching for improved LE strength and mobility. Added tandem stance for improved balance. Patient tolerated this well and shows good static stability. Adjusted patients cane and instructed him on correct hand and sequence for using cane to assist RLE during gait. Patient will continue to benefit from skilled therapy services to progress knee strength and mobility to reduce paid and improve functional mobility.    Personal Factors and Comorbidities Comorbidity 2;Comorbidity 1    Comorbidities hx of cervical and lumbar surgery 9lumbar fusion august of this year)    Examination-Activity Limitations Bed Mobility;Bend;Carry;Dressing;Locomotion Level;Squat;Stairs;Stand;Toileting;Transfers    Examination-Participation Restrictions Community Activity;Driving;Laundry;Shop;Yard Work    [employer] officer Stable/Uncomplicated    Rehab Potential Excellent    PT Frequency 3x / week    PT Duration 6 weeks    PT Treatment/Interventions Electrical Stimulation;Moist Heat;Ultrasound;DME Instruction;Gait training;Stair training;Functional mobility training;Therapeutic activities;Therapeutic exercise;Balance training;Patient/family education;Manual techniques;Passive range of motion;Taping;ADLs/Self Care Home Management;Neuromuscular re-education;Scar mobilization;Dry needling;Joint Manipulations    PT Next Visit Plan Increase ROM and strengthening exercises for R knee to increase HEP development    PT  Home Exercise Plan 12/1: Heel slides and seated knee flexion stretch; 12/3: quad sets and supine heel slides 12/6 mini squat, heel slides, SLR    Consulted and Agree with Plan of Care Patient           Patient will benefit from skilled therapeutic intervention in order to improve the following deficits and impairments:  Abnormal gait, Decreased activity tolerance, Decreased balance, Decreased coordination, Decreased endurance, Decreased mobility, Decreased range of motion, Decreased strength, Difficulty walking, Pain, Decreased skin integrity, Increased edema, Decreased scar mobility  Visit Diagnosis: Difficulty in walking, not elsewhere classified  Muscle weakness (generalized)  Other abnormalities of gait and mobility     Problem List Patient Active Problem List   Diagnosis Date Noted  . Primary localized osteoarthritis of right knee 05/04/2020  . Lumbar adjacent segment disease with spondylolisthesis 02/06/2020  . Chest pressure 09/27/2019  . 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  . Morbid obesity ([place]) 05/08/2015  . DJD (degenerative joint disease) of knee 07/14/2014  . Acute upper respiratory infection 06/12/2014  . Cigarette smoker 06/12/2014  . Cough 06/12/2014  . Primary localized osteoarthritis of left knee   . Hyperlipidemia   . GERD (gastroesophageal reflux disease)   . Back pain   . Dyspnea 05/07/2014  . Multiple pulmonary nodules 05/07/2014   4:44 PM, 05/20/20 Josue Hector PT DPT  Physical Therapist with [facility]  (  [place]) [place], [place], [address] Phone: [phone]   Fax:  [phone]  Name: Patrick Mathews MRN: [id number] Date of Birth: 10-29-1950

## 2020-05-21 DIAGNOSIS — M1711 Unilateral primary osteoarthritis, right knee: Secondary | ICD-10-CM | POA: Diagnosis not present

## 2020-05-21 DIAGNOSIS — R109 Unspecified abdominal pain: Secondary | ICD-10-CM | POA: Diagnosis not present

## 2020-05-21 DIAGNOSIS — M25561 Pain in right knee: Secondary | ICD-10-CM | POA: Diagnosis not present

## 2020-05-22 ENCOUNTER — Other Ambulatory Visit: Payer: Self-pay

## 2020-05-22 ENCOUNTER — Encounter (HOSPITAL_COMMUNITY): Payer: Self-pay | Admitting: Physical Therapy

## 2020-05-22 ENCOUNTER — Ambulatory Visit (HOSPITAL_COMMUNITY): Payer: Medicare Other | Admitting: Physical Therapy

## 2020-05-22 DIAGNOSIS — R2689 Other abnormalities of gait and mobility: Secondary | ICD-10-CM

## 2020-05-22 DIAGNOSIS — M6281 Muscle weakness (generalized): Secondary | ICD-10-CM

## 2020-05-22 DIAGNOSIS — R262 Difficulty in walking, not elsewhere classified: Secondary | ICD-10-CM

## 2020-05-22 NOTE — Therapy (Addendum)
[place], [place], [address] Phone: [phone]   Fax:  [phone]  Physical Therapy Treatment  Patient Details  Name: TAL KEMPKER MRN: [id number] Date of Birth: October 07, 1950 Referring Provider (PT): Audree Camel. Noemi Chapel   Encounter Date: 05/22/2020   PT End of Session - 05/22/20 0844    Visit Number 5    Number of Visits 18    Date for PT Re-Evaluation 06/24/20    Authorization Type medicare primary, [employer] secondary. follow medicare, no auth required    Progress Note Due on Visit 10    PT Start Time [phone]    PT Stop Time 0911    PT Time Calculation (min) 38 min    Activity Tolerance Patient tolerated treatment well;Patient limited by pain;No increased pain    Behavior During Therapy WFL for tasks assessed/performed           Past Medical History:  Diagnosis Date  . Adjacent segment disease with spinal stenosis   . Arthritis    "knees; back" (07/14/2014)  . Asthma   . CAD (coronary artery disease)   . Chest pain   . Exertional dyspnea   . GERD (gastroesophageal reflux disease)   . Hyperlipidemia   . Hypertension   . Pneumonia 11/2013  . Primary localized osteoarthritis of left knee   . Primary localized osteoarthritis of right knee 05/04/2020  . Seasonal allergies     Past Surgical History:  Procedure Laterality Date  . ANTERIOR CERVICAL DECOMP/DISCECTOMY FUSION  2009   Dr Joya Salm  . BACK SURGERY    . BUNIONECTOMY Right  2013  . COLONOSCOPY N/A 11/06/2012   Procedure: COLONOSCOPY;  Surgeon: Jamesetta So, MD;  Location: [facility];  Service: Gastroenterology;  Laterality: N/A;  . FOOT SURGERY    . JOINT REPLACEMENT    . KNEE ARTHROSCOPY Bilateral 1990's  . [place]; 2009   Dr Joya Salm  . TONSILLECTOMY  1950's  . TOTAL KNEE ARTHROPLASTY Left 07/14/2014  . TOTAL KNEE ARTHROPLASTY Left 07/14/2014   Procedure: LEFT TOTAL KNEE ARTHROPLASTY;  Surgeon: [name], MD;   Location: [place] [facility];  Service: Orthopedics;  Laterality: Left;  . TOTAL KNEE ARTHROPLASTY Right 05/11/2020   Procedure: TOTAL KNEE ARTHROPLASTY;  Surgeon: Elsie Saas, MD;  Location: [facility];  Service: Orthopedics;  Laterality: Right;  . WISDOM TOOTH EXTRACTION      There were no vitals filed for this visit.   Subjective Assessment - 05/22/20 0840    Subjective Patient reports feeling better and had appointment with ortho MD yesterday.  Less pain and able to walk with     Pertinent History lumbar fusion earlier this year    Limitations Standing;Lifting;Walking;House hold activities    How long can you sit comfortably? no difficulty    How long can you stand comfortably? a couple minutes    How long can you walk comfortably? a couple minutres    Patient Stated Goals to be able to walk without pain    Currently in Pain? Yes    Pain Score 2     Pain Location Knee    Pain Orientation Right    Pain Descriptors / Indicators Aching    Pain Onset 1 to 4 weeks ago              [facility] PT Assessment - 05/22/20 0001      Assessment   Medical Diagnosis R TKA  Referring Provider (PT) Audree Camel. [place]    Onset Date/Surgical Date 05/11/20      AROM   AROM Assessment Site Knee    Right/Left Knee Right    Right Knee Extension 4   lacking   Right Knee Flexion 105                         [facility] Adult PT Treatment/Exercise - 05/22/20 0001      Transfers   Transfers Sit to Stand   Mini-squats in parallel bars with elevated seat height for symmetric form 5x5  Sit to stands from 22" seat height to improve LE push-off 2x5     Knee/Hip Exercises: Stretches   Other Knee/Hip Stretches knee flexion stretch using stairs 10x5 sec hold      Knee/Hip Exercises: Aerobic   Stationary Bike Bike x 6 min backwards-forwards for knee flexion-extension ROM      Knee/Hip Exercises: Standing   Wall Squat 2 sets;5 reps                  PT Education - 05/22/20 0841    Education  Details Patient education on contined HEP to improve knee flexion-extension and symmetrical stance with sit to stand    Person(s) Educated Patient    Methods Explanation            PT Short Term Goals - 05/13/20 1134      PT SHORT TERM GOAL #1   Title Patient will demonstrate increased knee extension to lacking 5 degrees to increased stride length.    Baseline lacking 15 degrees    Time 3    Period Weeks    Status New    Target Date 06/03/20      PT SHORT TERM GOAL #2   Title Patient will demonstrate increased knee flexion of 115 degrees    Baseline 60-84 with AAROM using belt    Time 3    Period Weeks    Status New    Target Date 06/03/20      PT SHORT TERM GOAL #3   Title Patient will demonstrate 100% HEP recall for R knee ROM/strengthening exercises to improve independent self-progression    Baseline in progress    Time 3    Period Weeks    Status New    Target Date 06/03/20      PT SHORT TERM GOAL #4   Title Patient will be able to ambulate 300 ft with SPC to reduce need for AD to increase independence    Baseline RW with heavy, antalgic 3-point pattern    Time 3    Period Weeks    Status New             PT Long Term Goals - 05/13/20 1143      PT LONG TERM GOAL #1   Title Patient will demonstrate increased knee extension to 0 degrees to increased stride length    Baseline lacking 15    Time 6    Period Weeks    Status New    Target Date 06/24/20      PT LONG TERM GOAL #2   Title Patient will demonstrate increased knee flexion of 120 degrees to be able to squat to the ground to work in garden    Baseline 60 degrees R knee flexion    Time 6    Period Weeks    Status New    Target Date 06/24/20  PT LONG TERM GOAL #3   Title Patient will demonstrate independent ambulation over various surfaces to reduce use of AD    Baseline RW with 3 point pattern, level surfaces    Time 6    Period Weeks    Status New    Target Date 06/24/20      PT LONG  TERM GOAL #4   Title Patient will demonstrate modified independent stair ambulation with single HR and reciprocal pattern to improve independence and safety with home environment    Baseline CGA with non-reciprocal pattern    Time 6    Period Weeks    Status New    Target Date 06/24/20      PT LONG TERM GOAL #5   Title Patient will improve on FOTO score to meet predicted outcomes to demonstrate improved functional mobility.                 Plan - 05/22/20 0848    Clinical Impression Statement Patient tolerated tx session well and demonstrates improve knee ROM and activity tolerance with decreased antalgia noted and able to use Edith Endave vs RW.  Patient tolerating increased activity levels without adverse effects and demo limited knee edema. Able to perform sit to stand from lower seat heights without compensation and demonstrates increased knee extension and flexion ROM    Personal Factors and Comorbidities Comorbidity 2;Comorbidity 1    Comorbidities hx of cervical and lumbar surgery 9lumbar fusion august of this year)    Examination-Activity Limitations Bed Mobility;Bend;Carry;Dressing;Locomotion Level;Squat;Stairs;Stand;Toileting;Transfers    Examination-Participation Restrictions Community Activity;Driving;Laundry;Shop;Yard Work    [employer] Stable/Uncomplicated    Rehab Potential Excellent    PT Frequency 3x / week    PT Duration 6 weeks    PT Treatment/Interventions Electrical Stimulation;Moist Heat;Ultrasound;DME Instruction;Gait training;Stair training;Functional mobility training;Therapeutic activities;Therapeutic exercise;Balance training;Patient/family education;Manual techniques;Passive range of motion;Taping;ADLs/Self Care Home Management;Neuromuscular re-education;Scar mobilization;Dry needling;Joint Manipulations    PT Next Visit Plan Increase ROM and strengthening exercises for R knee to increase HEP development    PT Home Exercise Plan 12/1: Heel  slides and seated knee flexion stretch; 12/3: quad sets and supine heel slides 12/6 mini squat, heel slides, SLR, 12/10: sit to stand from lower seat heights, lunge stretch with stair, wall sits    Consulted and Agree with Plan of Care Patient           Patient will benefit from skilled therapeutic intervention in order to improve the following deficits and impairments:  Abnormal gait,Decreased activity tolerance,Decreased balance,Decreased coordination,Decreased endurance,Decreased mobility,Decreased range of motion,Decreased strength,Difficulty walking,Pain,Decreased skin integrity,Increased edema,Decreased scar mobility  Visit Diagnosis: Difficulty in walking, not elsewhere classified  Muscle weakness (generalized)  Other abnormalities of gait and mobility     Problem List Patient Active Problem List   Diagnosis Date Noted  . Primary localized osteoarthritis of right knee 05/04/2020  . Lumbar adjacent segment disease with spondylolisthesis 02/06/2020  . Chest pressure 09/27/2019  . 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  . Morbid obesity ([place]) 05/08/2015  . DJD (degenerative joint disease) of knee 07/14/2014  . Acute upper respiratory infection 06/12/2014  . Cigarette smoker 06/12/2014  . Cough 06/12/2014  . Primary localized osteoarthritis of left knee   . Hyperlipidemia   . GERD (gastroesophageal reflux disease)   . Back pain   . Dyspnea 05/07/2014  . Multiple pulmonary nodules 05/07/2014    9:36 AM, 05/22/20 M. Sherlyn Lees, PT, DPT Physical Therapist- [place] Office Number: [phone]  [place]  [facility] [address] [place], [place], [address] Phone: [phone]   Fax:  [phone]  Name: HOLLY PRING MRN: [id number] Date of Birth: 07-02-1950

## 2020-05-25 ENCOUNTER — Emergency Department (HOSPITAL_COMMUNITY)
Admission: EM | Admit: 2020-05-25 | Discharge: 2020-05-25 | Disposition: A | Discharge status: Home / Self Care | Payer: Medicare Other | Attending: Emergency Medicine | Admitting: Emergency Medicine

## 2020-05-25 ENCOUNTER — Emergency Department (HOSPITAL_COMMUNITY): Payer: Medicare Other

## 2020-05-25 ENCOUNTER — Other Ambulatory Visit: Payer: Self-pay

## 2020-05-25 ENCOUNTER — Encounter (HOSPITAL_COMMUNITY): Payer: Self-pay | Admitting: Emergency Medicine

## 2020-05-25 ENCOUNTER — Ambulatory Visit (HOSPITAL_COMMUNITY): Payer: Medicare Other | Admitting: Physical Therapy

## 2020-05-25 ENCOUNTER — Telehealth (HOSPITAL_COMMUNITY): Payer: Self-pay | Admitting: Physical Therapy

## 2020-05-25 DIAGNOSIS — F17210 Nicotine dependence, cigarettes, uncomplicated: Secondary | ICD-10-CM | POA: Diagnosis not present

## 2020-05-25 DIAGNOSIS — K5909 Other constipation: Secondary | ICD-10-CM | POA: Insufficient documentation

## 2020-05-25 DIAGNOSIS — Z96651 Presence of right artificial knee joint: Secondary | ICD-10-CM | POA: Insufficient documentation

## 2020-05-25 DIAGNOSIS — J45909 Unspecified asthma, uncomplicated: Secondary | ICD-10-CM | POA: Diagnosis not present

## 2020-05-25 DIAGNOSIS — Z96652 Presence of left artificial knee joint: Secondary | ICD-10-CM | POA: Diagnosis not present

## 2020-05-25 DIAGNOSIS — I2510 Atherosclerotic heart disease of native coronary artery without angina pectoris: Secondary | ICD-10-CM | POA: Diagnosis not present

## 2020-05-25 DIAGNOSIS — Z79899 Other long term (current) drug therapy: Secondary | ICD-10-CM | POA: Diagnosis not present

## 2020-05-25 DIAGNOSIS — I509 Heart failure, unspecified: Secondary | ICD-10-CM | POA: Insufficient documentation

## 2020-05-25 DIAGNOSIS — R109 Unspecified abdominal pain: Secondary | ICD-10-CM | POA: Insufficient documentation

## 2020-05-25 DIAGNOSIS — T402X5A Adverse effect of other opioids, initial encounter: Secondary | ICD-10-CM

## 2020-05-25 DIAGNOSIS — I110 Hypertensive heart disease with heart failure: Secondary | ICD-10-CM | POA: Insufficient documentation

## 2020-05-25 DIAGNOSIS — K5900 Constipation, unspecified: Secondary | ICD-10-CM | POA: Diagnosis not present

## 2020-05-25 DIAGNOSIS — K5903 Drug induced constipation: Secondary | ICD-10-CM | POA: Diagnosis not present

## 2020-05-25 LAB — CBC WITH DIFFERENTIAL/PLATELET
Abs Immature Granulocytes: 0.02 10*3/uL (ref 0.00–0.07)
Basophils Absolute: 0 10*3/uL (ref 0.0–0.1)
Basophils Relative: 0 %
Eosinophils Absolute: 0 10*3/uL (ref 0.0–0.5)
Eosinophils Relative: 0 %
HCT: 37.5 % — ABNORMAL LOW (ref 39.0–52.0)
Hemoglobin: 12.5 g/dL — ABNORMAL LOW (ref 13.0–17.0)
Immature Granulocytes: 0 %
Lymphocytes Relative: 10 %
Lymphs Abs: 0.9 10*3/uL (ref 0.7–4.0)
MCH: 31.1 pg (ref 26.0–34.0)
MCHC: 33.3 g/dL (ref 30.0–36.0)
MCV: 93.3 fL (ref 80.0–100.0)
Monocytes Absolute: 0.5 10*3/uL (ref 0.1–1.0)
Monocytes Relative: 6 %
Neutro Abs: 7.2 10*3/uL (ref 1.7–7.7)
Neutrophils Relative %: 84 %
Platelets: 320 10*3/uL (ref 150–400)
RBC: 4.02 MIL/uL — ABNORMAL LOW (ref 4.22–5.81)
RDW: 13.5 % (ref 11.5–15.5)
WBC: 8.7 10*3/uL (ref 4.0–10.5)
nRBC: 0 % (ref 0.0–0.2)

## 2020-05-25 LAB — COMPREHENSIVE METABOLIC PANEL
ALT: 27 U/L (ref 0–44)
AST: 21 U/L (ref 15–41)
Albumin: 4.2 g/dL (ref 3.5–5.0)
Alkaline Phosphatase: 121 U/L (ref 38–126)
Anion gap: 10 (ref 5–15)
BUN: 13 mg/dL (ref 8–23)
CO2: 26 mmol/L (ref 22–32)
Calcium: 9.1 mg/dL (ref 8.9–10.3)
Chloride: 100 mmol/L (ref 98–111)
Creatinine, Ser: 1.11 mg/dL (ref 0.61–1.24)
GFR, Estimated: >60 mL/min (ref >60)
Glucose, Bld: 104 mg/dL — ABNORMAL HIGH (ref 70–99)
Potassium: 3.4 mmol/L — ABNORMAL LOW (ref 3.5–5.1)
Sodium: 136 mmol/L (ref 135–145)
Total Bilirubin: 1.1 mg/dL (ref 0.3–1.2)
Total Protein: 7.7 g/dL (ref 6.5–8.1)

## 2020-05-25 LAB — URINALYSIS, ROUTINE W REFLEX MICROSCOPIC
Bilirubin Urine: NEGATIVE
Glucose, UA: NEGATIVE mg/dL
Hgb urine dipstick: NEGATIVE
Ketones, ur: NEGATIVE mg/dL
Leukocytes,Ua: NEGATIVE
Nitrite: NEGATIVE
Protein, ur: NEGATIVE mg/dL
Specific Gravity, Urine: 1.014 (ref 1.005–1.030)
pH: 5 (ref 5.0–8.0)

## 2020-05-25 LAB — LIPASE, BLOOD: Lipase: 24 U/L (ref 11–51)

## 2020-05-25 MED ORDER — ONDANSETRON HCL 4 MG/2ML IJ SOLN
4.0000 mg | Freq: Once | INTRAMUSCULAR | Status: AC
  Administered 2020-05-25: 19:00:00 4 mg via INTRAVENOUS
  Filled 2020-05-25: qty 2

## 2020-05-25 MED ORDER — LUBIPROSTONE 24 MCG PO CAPS: 24.0000 ug | ORAL_CAPSULE | Freq: Two times a day (BID) | ORAL | 0 refills | Status: AC

## 2020-05-25 MED ORDER — SODIUM CHLORIDE 0.9 % IV BOLUS
500.0000 mL | Freq: Once | INTRAVENOUS | Status: AC
  Administered 2020-05-25: 17:00:00 500 mL via INTRAVENOUS

## 2020-05-25 MED ORDER — IOHEXOL 300 MG/ML  SOLN
100.0000 mL | Freq: Once | INTRAMUSCULAR | Status: AC | PRN
  Administered 2020-05-25: 21:00:00 100 mL via INTRAVENOUS

## 2020-05-25 NOTE — ED Triage Notes (Signed)
Pt c/o constipation and abd pain x4 days.

## 2020-05-25 NOTE — Discharge Instructions (Signed)
At this time there does not appear to be the presence of an emergent medical condition, however there is always the potential for conditions to change. Please read and follow the below instructions.  Please return to the Emergency Department immediately for any new or worsening symptoms. Please be sure to follow up with your Primary Care Provider within one week regarding your visit today; please call their office to schedule an appointment even if you are feeling better for a follow-up visit. Please drink only water and get plenty of rest.  You may use 2-3 capfuls of MiraLAX 2 times a day to help with your constipation.  Please avoid further opioid use. You may use the medication lubiprostone as prescribed to help with opioid-induced constipation Your CT today also showed diverticulosis, kidney stones, renal cysts, atelectasis, liver cyst, hiatal hernia, atherosclerosis, umbilical hernia, degenerative changes of the hips and spine.  Please discuss these incidental findings with your primary care provider at your follow-up visit  Go to the nearest Emergency Department immediately if: You have fever or chills You have a fever, and your symptoms suddenly get worse. You leak poop or have blood in your poop. Your belly feels hard or bigger than normal (is bloated). You have very bad belly pain. You feel dizzy or you faint. You have any new/concerning or worsening of symptoms   Please read the additional information packets attached to your discharge summary.  Do not take your medicine if  develop an itchy rash, swelling in your mouth or lips, or difficulty breathing; call 911 and seek immediate emergency medical attention if this occurs.  You may review your lab tests and imaging results in their entirety on your MyChart account.  Please discuss all results of fully with your primary care provider and other specialist at your follow-up visit.  Note: Portions of this text may have been transcribed  using voice recognition software. Every effort was made to ensure accuracy; however, inadvertent computerized transcription errors may still be present.

## 2020-05-25 NOTE — Telephone Encounter (Signed)
pt called to cx this appt due to an upset stomach

## 2020-05-25 NOTE — ED Provider Notes (Signed)
[facility] EMERGENCY DEPARTMENT Provider Note   CSN: [id number] Arrival date & time: 05/25/20  1308     History Chief Complaint  Patient presents with  . Constipation    Patrick Mathews is a 69 y.o. male history of CAD, GERD, hypertension, hyperlipidemia, obesity.  Patient presents today for concern of constipation and abdominal pain.  He reports he had right knee surgery and has been taking narcotic pain medication.  He reports decreased bowel movements since he had surgery he reports he has only had a small amount of watery stool over the past 4 days still passing gas well.  He reports he stopped taking narcotics 4 days ago but bowel movements have not yet returned to normal.  He reports he has been using MiraLAX with minimal relief.  He reports abdominal cramping sensation which has been mild intermittent no aggravating or alleviating factors.  Denies fever/chills, nausea/vomiting, chest pain/shortness of breath, dysuria/hematuria, extremity swelling/color change or any additional concerns  HPI     Past Medical History:  Diagnosis Date  . Adjacent segment disease with spinal stenosis   . Arthritis    "knees; back" (07/14/2014)  . Asthma   . CAD (coronary artery disease)   . Chest pain   . Exertional dyspnea   . GERD (gastroesophageal reflux disease)   . Hyperlipidemia   . Hypertension   . Pneumonia 11/2013  . Primary localized osteoarthritis of left knee   . Primary localized osteoarthritis of right knee 05/04/2020  . Seasonal allergies     Patient Active Problem List   Diagnosis Date Noted  . Primary localized osteoarthritis of right knee 05/04/2020  . Lumbar adjacent segment disease with spondylolisthesis 02/06/2020  . Chest pressure 09/27/2019  . 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  . Morbid obesity ([place]) 05/08/2015  . DJD (degenerative joint disease) of knee 07/14/2014  . Acute upper respiratory infection 06/12/2014  .  Cigarette smoker 06/12/2014  . Cough 06/12/2014  . Primary localized osteoarthritis of left knee   . Hyperlipidemia   . GERD (gastroesophageal reflux disease)   . Back pain   . Dyspnea 05/07/2014  . Multiple pulmonary nodules 05/07/2014    Past Surgical History:  Procedure Laterality Date  . ANTERIOR CERVICAL DECOMP/DISCECTOMY FUSION  2009   Dr Joya Salm  . BACK SURGERY    . BUNIONECTOMY Right  2013  . COLONOSCOPY N/A 11/06/2012   Procedure: COLONOSCOPY;  Surgeon: Jamesetta So, MD;  Location: [facility];  Service: Gastroenterology;  Laterality: N/A;  . FOOT SURGERY    . JOINT REPLACEMENT    . KNEE ARTHROSCOPY Bilateral 1990's  . [place]; 2009   Dr Joya Salm  . TONSILLECTOMY  1950's  . TOTAL KNEE ARTHROPLASTY Left 07/14/2014  . TOTAL KNEE ARTHROPLASTY Left 07/14/2014   Procedure: LEFT TOTAL KNEE ARTHROPLASTY;  Surgeon: [name], MD;  Location: [facility];  Service: Orthopedics;  Laterality: Left;  . TOTAL KNEE ARTHROPLASTY Right 05/11/2020   Procedure: TOTAL KNEE ARTHROPLASTY;  Surgeon: Elsie Saas, MD;  Location: [facility];  Service: Orthopedics;  Laterality: Right;  . WISDOM TOOTH EXTRACTION         Family History  Problem Relation Age of Onset  . CAD Mother   . CVA Mother   . Alzheimer's disease Mother   . Dementia Father   . Alzheimer's disease Father   . Stroke Maternal Grandmother   . Heart attack Maternal Grandfather   . CAD Maternal Grandfather   .  Stroke Paternal Grandmother   . Colon cancer Neg Hx   . Colon polyps Neg Hx     Social History   Tobacco Use  . Smoking status: Current Some Day Smoker    Years: 30.00    Types: Cigars  . Smokeless tobacco: Never Used  . Tobacco comment: 07/14/2014 smoked cigs on and off x 40 yrs- none since 2014, but does smoke occ cigar-  Vaping Use  . Vaping Use: Never used  Substance Use Topics  . Alcohol use: Yes    Alcohol/week: 0.0 standard drinks    Comment: occ  . Drug use: No    Home  Medications Prior to Admission medications   Medication Sig Start Date End Date Taking? Authorizing Provider  acetaminophen (TYLENOL) 325 MG tablet Take 650 mg by mouth every 6 (six) hours as needed for mild pain or moderate pain.    [provider]  aspirin EC 325 MG EC tablet Take 1 tablet (325 mg total) by mouth daily with breakfast. 05/13/20   Shepperson, Kirstin, PA-C  cyclobenzaprine (FLEXERIL) 10 MG tablet Take 1 tablet (10 mg total) by mouth 3 (three) times daily as needed for muscle spasms. 02/07/20   Newman Pies, MD  docusate sodium (COLACE) 100 MG capsule Take 1 capsule (100 mg total) by mouth 2 (two) times daily. Patient taking differently: Take 100 mg by mouth daily as needed for mild constipation or moderate constipation.  02/07/20   Newman Pies, MD  gabapentin (NEURONTIN) 300 MG capsule Take 300 mg by mouth daily as needed (pain).  05/24/19   [provider]  gabapentin (NEURONTIN) 600 MG tablet Take 600 mg by mouth at bedtime.    [provider]  lubiprostone (AMITIZA) 24 MCG capsule Take 1 capsule (24 mcg total) by mouth 2 (two) times daily with a meal for 3 days. 05/25/20 05/28/20  Nuala Alpha A, PA-C  nitroGLYCERIN (NITROSTAT) 0.4 MG SL tablet Place 1 tablet (0.4 mg total) under the tongue every 5 (five) minutes as needed for chest pain. 07/11/19   Nahser, Wonda Cheng, MD  omeprazole (PRILOSEC) 20 MG capsule Take 20 mg by mouth daily before breakfast.    [provider]  oxyCODONE (OXY IR/ROXICODONE) 5 MG immediate release tablet 1 po q 4 hrs prn pain 05/12/20   Shepperson, Kirstin, PA-C  polyethylene glycol (MIRALAX / GLYCOLAX) 17 g packet 17grams in 8 oz of something to drink twice a day until bowel movement.  LAXITIVE.  Restart if two days since last bowel movement 05/12/20   Shepperson, Kirstin, PA-C  rosuvastatin (CRESTOR) 10 MG tablet Take 10 mg by mouth daily.    [provider]  sildenafil (VIAGRA) 100 MG tablet Take 100  mg by mouth daily as needed for erectile dysfunction.  04/21/20   [provider]  tadalafil (CIALIS) 5 MG tablet Take 5 mg by mouth daily as needed for erectile dysfunction.  06/24/19   [provider]  trolamine salicylate (BLUE-EMU HEMP) 10 % cream Apply 1 application topically daily as needed for muscle pain.    [provider]    Allergies    Morphine and related and Sulfa antibiotics  Review of Systems   Review of Systems Ten systems are reviewed and are negative for acute change except as noted in the HPI  Physical Exam Updated Vital Signs BP 131/88 (BP Location: Left Arm)   Pulse 79   Temp 97.9 F (36.6 C) (Oral)   Resp 17   Ht 5'  10" (1.778 m)   Wt 97.1 kg   SpO2 98%   BMI 30.71 kg/m   Physical Exam Constitutional:      General: He is not in acute distress.    Appearance: Normal appearance. He is well-developed. He is not ill-appearing or diaphoretic.  HENT:     Head: Normocephalic and atraumatic.  Eyes:     General: Vision grossly intact. Gaze aligned appropriately.     Pupils: Pupils are equal, round, and reactive to light.  Neck:     Trachea: Trachea and phonation normal.  Pulmonary:     Effort: Pulmonary effort is normal. No respiratory distress.  Abdominal:     General: Bowel sounds are normal. There is no distension.     Palpations: Abdomen is soft.     Tenderness: There is no abdominal tenderness. There is no guarding or rebound.  Musculoskeletal:        General: Normal range of motion.     Cervical back: Normal range of motion.     Comments: Well-healing surgical scar right knee  Skin:    General: Skin is warm and dry.  Neurological:     Mental Status: He is alert.     GCS: GCS eye subscore is 4. GCS verbal subscore is 5. GCS motor subscore is 6.     Comments: Speech is clear and goal oriented, follows commands Major Cranial nerves without deficit, no facial droop Moves extremities without ataxia, coordination intact   Psychiatric:        Behavior: Behavior normal.     ED Results / Procedures / Treatments   Labs (all labs ordered are listed, but only abnormal results are displayed) Labs Reviewed  CBC WITH DIFFERENTIAL/PLATELET - Abnormal; Notable for the following components:      Result Value   RBC 4.02 (*)    Hemoglobin 12.5 (*)    HCT 37.5 (*)    All other components within normal limits  COMPREHENSIVE METABOLIC PANEL - Abnormal; Notable for the following components:   Potassium 3.4 (*)    Glucose, Bld 104 (*)    All other components within normal limits  LIPASE, BLOOD  URINALYSIS, ROUTINE W REFLEX MICROSCOPIC    EKG None  Radiology DG Abdomen 1 View  Result Date: 05/25/2020 CLINICAL DATA:  Constipation, nausea EXAM: ABDOMEN - 1 VIEW COMPARISON:  None. FINDINGS: Nonobstructive pattern of bowel gas with gas present to the rectum. No significant burden of stool in the colon. No free air in the abdomen on supine radiographs. No radio-opaque calculi or other significant radiographic abnormality are seen. IMPRESSION: Nonobstructive pattern of bowel gas with gas present to the rectum. No significant burden of stool in the colon. No free air in the abdomen on supine radiographs. Electronically Signed   By: Eddie Candle M.D.   On: 05/25/2020 17:54   CT ABDOMEN PELVIS W CONTRAST  Result Date: 05/25/2020 CLINICAL DATA:  Diffuse abdominal pain and nausea. Constipation. Bowel obstruction suspected. EXAM: CT ABDOMEN AND PELVIS WITH CONTRAST TECHNIQUE: Multidetector CT imaging of the abdomen and pelvis was performed using the standard protocol following bolus administration of intravenous contrast. CONTRAST:  176mL OMNIPAQUE IOHEXOL 300 MG/ML  SOLN COMPARISON:  Radiograph earlier today. FINDINGS: Lower chest: Subsegmental atelectasis in the right lower lobe. No pleural fluid. Heart is normal in size. Hepatobiliary: 4.3 cm cyst in the right lobe of the liver. Additional tiny hepatic hypodensities are too  small to accurately characterize but likely additional cysts. No suspicious lesion. Gallbladder physiologically  distended, no calcified stone. No biliary dilatation. Pancreas: No ductal dilatation or inflammation. Mild motion artifact through the pancreatic head. Spleen: Normal in size without focal abnormality. Adrenals/Urinary Tract: No adrenal nodule. No hydronephrosis. There are bilateral parapelvic cysts, right greater than left. Cortical cyst arising from the lower left kidney measures 4.2 cm. Probable punctate nonobstructing stones in the upper right and left kidney. No ureteral calculi. The urinary bladder is minimally distended without wall thickening. Stomach/Bowel: The stomach is nondistended a not well assessed. Tiny hiatal hernia. Normal positioning of the duodenum and ligament of Treitz. Unremarkable small bowel without obstruction or inflammation. Normal appendix. Moderate colonic stool burden. Diverticulosis of the distal descending and sigmoid colon. No diverticulitis or colonic inflammation. No colonic wall thickening. No evidence of colonic mass. Vascular/Lymphatic: Aorto bi-iliac atherosclerosis. No aortic aneurysm. Patent portal vein. No enlarged lymph nodes in the abdomen or pelvis. Reproductive: Prostate is unremarkable. Other: No free air or ascites.  Tiny fat containing umbilical hernia Musculoskeletal: Posterior lumbar fusion L2 through L5. Degenerative change of both hips. No acute osseous abnormalities. IMPRESSION: 1. No acute abnormality in the abdomen/pelvis. No bowel obstruction or inflammation. 2. Colonic diverticulosis without diverticulitis. Moderate colonic stool burden, can be seen with constipation. 3. Suspected punctate bilateral nonobstructing nephrolithiasis. Bilateral parapelvic renal cysts as well as cortical cyst in the lower left kidney. Aortic Atherosclerosis (ICD10-I70.0). Electronically Signed   By: Keith Rake M.D.   On: 05/25/2020 21:00     Procedures Procedures (including critical care time)  Medications Ordered in ED Medications  sodium chloride 0.9 % bolus 500 mL (0 mLs Intravenous Stopped 05/25/20 1759)  iohexol (OMNIPAQUE) 300 MG/ML solution 100 mL (100 mLs Intravenous Contrast Given 05/25/20 2031)  ondansetron (ZOFRAN) injection 4 mg (4 mg Intravenous Given 05/25/20 1928)    ED Course  I have reviewed the triage vital signs and the nursing notes.  Pertinent labs & imaging results that were available during my care of the patient were reviewed by me and considered in my medical decision making (see chart for details).    MDM Rules/Calculators/A&P                         Additional history obtained from: 1. Nursing notes from this visit. 2. Review of electronic medical records. ---------------- 69 year old male arrived for constipation x4 days recently using narcotics.  He experiencing some abdominal cramping overall he is well-appearing no acute distress no focal abdominal tenderness on exam no peritoneal signs.  He reports he still passing gas he has good bowel sounds on exam and has been having some small watery stool.  Has been using MiraLAX with minimal relief.  No infectious type symptoms no nausea or vomiting.  Will obtain CBC, CMP, lipase, urinalysis and x-ray of the abdomen. - I ordered, reviewed and interpreted labs which include: Urinalysis without evidence of infection. Lipase within normal limits, doubt pancreatitis. CMP shows no emergent electrolyte derangement, AKI, LFT elevations or gap. CBC shows no leukocytosis to suggest infection, mild anemia of 12.5  DG ABD:  IMPRESSION:  Nonobstructive pattern of bowel gas with gas present to the rectum.  No significant burden of stool in the colon. No free air in the  abdomen on supine radiographs.   Patient seen and evaluated by Dr. Roderic Palau, will obtain CT abdomen pelvis.  CT AP:  IMPRESSION:  1. No acute abnormality in the abdomen/pelvis. No  bowel obstruction  or inflammation.  2. Colonic diverticulosis without diverticulitis. Moderate  colonic  stool burden, can be seen with constipation.  3. Suspected punctate bilateral nonobstructing nephrolithiasis.  Bilateral parapelvic renal cysts as well as cortical cyst in the  lower left kidney.    Aortic Atherosclerosis (ICD10-I70.0).   Suspect patient symptoms today secondary to opioid-induced constipation, he is still passing gas well and having small amount of bowel movements.  He is stop using narcotics.  Encourage patient to maintain hydration and continue use of MiraLAX.  Shared decision making made with patient, will prescribe short course of lubiprostone to help with opioid-induced constipation, discussed side effects with patient he stated understanding.  He will follow-up with his PCP for recheck this week.  At this time there does not appear to be any evidence of an acute emergency medical condition and the patient appears stable for discharge with appropriate outpatient follow up. Diagnosis was discussed with patient who verbalizes understanding of care plan and is agreeable to discharge. I have discussed return precautions with patient and wife who verbalizes understanding. Patient encouraged to follow-up with their PCP. All questions answered.  Patient's case discussed with Dr. Roderic Palau who agrees with plan to discharge with Lubiprostone and follow-up.   Note: Portions of this report may have been transcribed using voice recognition software. Every effort was made to ensure accuracy; however, inadvertent computerized transcription errors may still be present. Final Clinical Impression(s) / ED Diagnoses Final diagnoses:  Constipation due to opioid therapy    Rx / DC Orders ED Discharge Orders         Ordered    lubiprostone (AMITIZA) 24 MCG capsule  2 times daily with meals        05/25/20 2120           Gari Crown 05/25/20 2121    Milton Ferguson,  MD 05/26/20 2213

## 2020-05-27 ENCOUNTER — Encounter (HOSPITAL_COMMUNITY): Payer: Self-pay | Admitting: Physical Therapy

## 2020-05-27 ENCOUNTER — Other Ambulatory Visit: Payer: Self-pay

## 2020-05-27 ENCOUNTER — Ambulatory Visit (HOSPITAL_COMMUNITY): Payer: Medicare Other | Admitting: Physical Therapy

## 2020-05-27 DIAGNOSIS — R262 Difficulty in walking, not elsewhere classified: Secondary | ICD-10-CM | POA: Diagnosis not present

## 2020-05-27 DIAGNOSIS — M6281 Muscle weakness (generalized): Secondary | ICD-10-CM | POA: Diagnosis not present

## 2020-05-27 DIAGNOSIS — R2689 Other abnormalities of gait and mobility: Secondary | ICD-10-CM | POA: Diagnosis not present

## 2020-05-27 NOTE — Therapy (Signed)
Patrick Mathews, [place], [address] Phone: [phone]   Fax:  [phone]  Physical Therapy Treatment  Patient Details  Name: Patrick Mathews MRN: [id number] Date of Birth: 05/30/1951 Referring Provider (PT): Audree Camel. Noemi Chapel   Encounter Date: 05/27/2020   PT End of Session - 05/27/20 1318    Visit Number 6    Number of Visits 18    Date for PT Re-Evaluation 06/24/20    Authorization Type medicare primary, [employer] secondary. follow medicare, no auth required    Progress Note Due on Visit 10    PT Start Time 1318    PT Stop Time 1357    PT Time Calculation (min) 39 min    Activity Tolerance Patient tolerated treatment well;Patient limited by pain;No increased pain    Behavior During Therapy [facility] for tasks assessed/performed           Past Medical History:  Diagnosis Date   Adjacent segment disease with spinal stenosis    Arthritis    "knees; back" (07/14/2014)   Asthma    CAD (coronary artery disease)    Chest pain    Exertional dyspnea    GERD (gastroesophageal reflux disease)    Hyperlipidemia    Hypertension    Pneumonia 11/2013   Primary localized osteoarthritis of left knee    Primary localized osteoarthritis of right knee 05/04/2020   Seasonal allergies     Past Surgical History:  Procedure Laterality Date   ANTERIOR CERVICAL DECOMP/DISCECTOMY FUSION  2009   Dr Evlyn Kanner SURGERY     BUNIONECTOMY Right  2013   COLONOSCOPY N/A 11/06/2012   Procedure: COLONOSCOPY;  Surgeon: Jamesetta So, MD;  [facility];  Service: Gastroenterology;  Laterality: N/A;   FOOT SURGERY     JOINT REPLACEMENT     KNEE ARTHROSCOPY Bilateral 49's   POSTERIOR LUMBAR FUSION  1985; 2009   Dr Joya Salm   TONSILLECTOMY  1950's   TOTAL KNEE ARTHROPLASTY Left 07/14/2014   TOTAL KNEE ARTHROPLASTY Left 07/14/2014   Procedure: LEFT TOTAL KNEE ARTHROPLASTY;  Surgeon: [name], MD;   Location: [facility];  Service: Orthopedics;  Laterality: Left;   TOTAL KNEE ARTHROPLASTY Right 05/11/2020   Procedure: TOTAL KNEE ARTHROPLASTY;  Surgeon: Elsie Saas, MD;  [facility];  Service: Orthopedics;  Laterality: Right;   WISDOM TOOTH EXTRACTION      There were no vitals filed for this visit.   Subjective Assessment - 05/27/20 1319    Subjective Patient states his stomach is still bothering him. He has tried taking multiple meds for his stomach without relief. His knee is feeling pretty good.    Pertinent History lumbar fusion earlier this year    Limitations Standing;Lifting;Walking;House hold activities    How long can you sit comfortably? no difficulty    How long can you stand comfortably? a couple minutes    How long can you walk comfortably? a couple minutres    Patient Stated Goals to be able to walk without pain    Currently in Pain? No/denies    Pain Onset 1 to 4 weeks ago                             [facility] Adult PT Treatment/Exercise - 05/27/20 0001      Knee/Hip Exercises: Aerobic   Recumbent Bike 5 min seat 12 rocking for ROM progresses to  full revolutions      Knee/Hip Exercises: Standing   Heel Raises Both;20 reps    Lateral Step Up Right;2 sets;10 reps;Hand Hold: 1;Step Height: 6"    Lateral Step Up Limitations eccentric control    Forward Step Up Right;2 sets;10 reps;Hand Hold: 0;Step Height: 6"    Functional Squat 10 reps;2 sets    Functional Squat Limitations with UE support    Wall Squat 5 reps;10 seconds    SLS with Vectors 5x5 second holds with intermittent UE support      Knee/Hip Exercises: Seated   Sit to Sand 1 set;15 reps;without UE support      Knee/Hip Exercises: Supine   Knee Extension AROM    Knee Extension Limitations 5   lacking   Knee Flexion AROM    Knee Flexion Limitations 121                  PT Education - 05/27/20 1323    Education Details Patient educated on HEP, exercise mechanics     Person(s) Educated Patient    Methods Explanation;Demonstration    Comprehension Verbalized understanding;Returned demonstration            PT Short Term Goals - 05/13/20 1134      PT SHORT TERM GOAL #1   Title Patient will demonstrate increased knee extension to lacking 5 degrees to increased stride length.    Baseline lacking 15 degrees    Time 3    Period Weeks    Status New    Target Date 06/03/20      PT SHORT TERM GOAL #2   Title Patient will demonstrate increased knee flexion of 115 degrees    Baseline 60-84 with AAROM using belt    Time 3    Period Weeks    Status New    Target Date 06/03/20      PT SHORT TERM GOAL #3   Title Patient will demonstrate 100% HEP recall for R knee ROM/strengthening exercises to improve independent self-progression    Baseline in progress    Time 3    Period Weeks    Status New    Target Date 06/03/20      PT SHORT TERM GOAL #4   Title Patient will be able to ambulate 300 ft with SPC to reduce need for AD to increase independence    Baseline RW with heavy, antalgic 3-point pattern    Time 3    Period Weeks    Status New             PT Long Term Goals - 05/13/20 1143      PT LONG TERM GOAL #1   Title Patient will demonstrate increased knee extension to 0 degrees to increased stride length    Baseline lacking 15    Time 6    Period Weeks    Status New    Target Date 06/24/20      PT LONG TERM GOAL #2   Title Patient will demonstrate increased knee flexion of 120 degrees to be able to squat to the ground to work in garden    Baseline 60 degrees R knee flexion    Time 6    Period Weeks    Status New    Target Date 06/24/20      PT LONG TERM GOAL #3   Title Patient will demonstrate independent ambulation over various surfaces to reduce use of AD    Baseline RW with 3 point  pattern, level surfaces    Time 6    Period Weeks    Status New    Target Date 06/24/20      PT LONG TERM GOAL #4   Title Patient will  demonstrate modified independent stair ambulation with single HR and reciprocal pattern to improve independence and safety with home environment    Baseline CGA with non-reciprocal pattern    Time 6    Period Weeks    Status New    Target Date 06/24/20      PT LONG TERM GOAL #5   Title Patient will improve on FOTO score to meet predicted outcomes to demonstrate improved functional mobility.                 Plan - 05/27/20 1318    Clinical Impression Statement Patient begins session with bike for dynamic warm up and ROM. Patient showing improving ROM today to lacking 5 -121. Patient able to complete step up on 4 inch step with ease, able to progress to 6 inch without UE support. Patient given cueing for eccentric control with lateral step down with good carry over. Patient requires cueing for decreasing weight shift off RLE with good carry over. Patient progressing well. Patient fatigues quickly with wall squats. Patient will continue to benefit from skilled physical therapy in order to reduce impairment and improve function.    Personal Factors and Comorbidities Comorbidity 2;Comorbidity 1    Comorbidities hx of cervical and lumbar surgery (lumbar fusion august of this year)    Examination-Activity Limitations Bed Mobility;Bend;Carry;Dressing;Locomotion Level;Squat;Stairs;Stand;Toileting;Transfers    Examination-Participation Restrictions Community Activity;Driving;Laundry;Shop;Yard Work    [employer] officer Stable/Uncomplicated    Rehab Potential Excellent    PT Frequency 3x / week    PT Duration 6 weeks    PT Treatment/Interventions Electrical Stimulation;Moist Heat;Ultrasound;DME Instruction;Gait training;Stair training;Functional mobility training;Therapeutic activities;Therapeutic exercise;Balance training;Patient/family education;Manual techniques;Passive range of motion;Taping;ADLs/Self Care Home Management;Neuromuscular re-education;Scar mobilization;Dry  needling;Joint Manipulations    PT Next Visit Plan Increase ROM and strengthening exercises for R knee to increase HEP development    PT Home Exercise Plan 12/1: Heel slides and seated knee flexion stretch; 12/3: quad sets and supine heel slides 12/6 mini squat, heel slides, SLR, 12/10: sit to stand from lower seat heights, lunge stretch with stair, wall sits 12/15 lateral step down    Consulted and Agree with Plan of Care Patient           Patient will benefit from skilled therapeutic intervention in order to improve the following deficits and impairments:  Abnormal gait,Decreased activity tolerance,Decreased balance,Decreased coordination,Decreased endurance,Decreased mobility,Decreased range of motion,Decreased strength,Difficulty walking,Pain,Decreased skin integrity,Increased edema,Decreased scar mobility  Visit Diagnosis: Difficulty in walking, not elsewhere classified  Muscle weakness (generalized)  Other abnormalities of gait and mobility     Problem List Patient Active Problem List   Diagnosis Date Noted   Primary localized osteoarthritis of right knee 05/04/2020   Lumbar adjacent segment disease with spondylolisthesis 02/06/2020   Chest pressure 09/27/2019   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   Morbid obesity ([place]) 05/08/2015   DJD (degenerative joint disease) of knee 07/14/2014   Acute upper respiratory infection 06/12/2014   Cigarette smoker 06/12/2014   Cough 06/12/2014   Primary localized osteoarthritis of left knee    Hyperlipidemia    GERD (gastroesophageal reflux disease)    Back pain    Dyspnea 05/07/2014   Multiple pulmonary nodules 05/07/2014    2:00 PM, 05/27/20 Patrick Mathews  PT, DPT Physical Therapist at [facility] [place], [place], [address] Phone: [phone]   Fax:  [phone]  Name:  Patrick Mathews MRN: [id number] Date of Birth: May 31, 1951

## 2020-05-27 NOTE — Patient Instructions (Signed)
Access Code: [email or web address] URL: [email or web address] Date: 05/27/2020 Prepared by: Mitzi Hansen Rhesa Forsberg  Exercises Lateral Step Down - 1 x daily - 7 x weekly - 2 sets - 10 reps

## 2020-05-28 DIAGNOSIS — R1012 Left upper quadrant pain: Secondary | ICD-10-CM | POA: Diagnosis not present

## 2020-05-28 DIAGNOSIS — K5730 Diverticulosis of large intestine without perforation or abscess without bleeding: Secondary | ICD-10-CM | POA: Diagnosis not present

## 2020-05-28 DIAGNOSIS — R1032 Left lower quadrant pain: Secondary | ICD-10-CM | POA: Diagnosis not present

## 2020-05-28 DIAGNOSIS — Z6832 Body mass index (BMI) 32.0-32.9, adult: Secondary | ICD-10-CM | POA: Diagnosis not present

## 2020-05-29 ENCOUNTER — Encounter (HOSPITAL_COMMUNITY): Payer: Self-pay | Admitting: Physical Therapy

## 2020-05-29 ENCOUNTER — Ambulatory Visit (HOSPITAL_COMMUNITY): Payer: Medicare Other | Admitting: Physical Therapy

## 2020-05-29 ENCOUNTER — Other Ambulatory Visit: Payer: Self-pay

## 2020-05-29 DIAGNOSIS — M6281 Muscle weakness (generalized): Secondary | ICD-10-CM | POA: Diagnosis not present

## 2020-05-29 DIAGNOSIS — R109 Unspecified abdominal pain: Secondary | ICD-10-CM | POA: Diagnosis not present

## 2020-05-29 DIAGNOSIS — K5903 Drug induced constipation: Secondary | ICD-10-CM | POA: Diagnosis not present

## 2020-05-29 DIAGNOSIS — R2689 Other abnormalities of gait and mobility: Secondary | ICD-10-CM

## 2020-05-29 DIAGNOSIS — R262 Difficulty in walking, not elsewhere classified: Secondary | ICD-10-CM

## 2020-05-29 DIAGNOSIS — K219 Gastro-esophageal reflux disease without esophagitis: Secondary | ICD-10-CM | POA: Diagnosis not present

## 2020-05-29 NOTE — Therapy (Signed)
[place], [place], [address] Phone: [phone]   Fax:  [phone]  Physical Therapy Treatment  Patient Details  Name: Patrick Mathews MRN: [id number] Date of Birth: Jan 05, 1951 Referring Provider (PT): Audree Camel. Noemi Chapel   Encounter Date: 05/29/2020   PT End of Session - 05/29/20 0847    Visit Number 7    Number of Visits 18    Date for PT Re-Evaluation 06/24/20    Authorization Type medicare primary, [employer] secondary. follow medicare, no auth required    Progress Note Due on Visit 10    PT Start Time 0830    PT Stop Time 0910    PT Time Calculation (min) 40 min    Activity Tolerance Patient tolerated treatment well;Patient limited by pain;No increased pain    Behavior During Therapy [facility] for tasks assessed/performed           Past Medical History:  Diagnosis Date  . Adjacent segment disease with spinal stenosis   . Arthritis    "knees; back" (07/14/2014)  . Asthma   . CAD (coronary artery disease)   . Chest pain   . Exertional dyspnea   . GERD (gastroesophageal reflux disease)   . Hyperlipidemia   . Hypertension   . Pneumonia 11/2013  . Primary localized osteoarthritis of left knee   . Primary localized osteoarthritis of right knee 05/04/2020  . Seasonal allergies     Past Surgical History:  Procedure Laterality Date  . ANTERIOR CERVICAL DECOMP/DISCECTOMY FUSION  2009   Dr Joya Salm  . BACK SURGERY    . BUNIONECTOMY Right  2013  . COLONOSCOPY N/A 11/06/2012   Procedure: COLONOSCOPY;  Surgeon: Jamesetta So, MD;  [facility];  Service: Gastroenterology;  Laterality: N/A;  . FOOT SURGERY    . JOINT REPLACEMENT    . KNEE ARTHROSCOPY Bilateral 1990's  . [place]; 2009   Dr Joya Salm  . TONSILLECTOMY  1950's  . TOTAL KNEE ARTHROPLASTY Left 07/14/2014  . TOTAL KNEE ARTHROPLASTY Left 07/14/2014   Procedure: LEFT TOTAL KNEE ARTHROPLASTY;  Surgeon: [name], MD;   Location: [facility];  Service: Orthopedics;  Laterality: Left;  . TOTAL KNEE ARTHROPLASTY Right 05/11/2020   Procedure: TOTAL KNEE ARTHROPLASTY;  Surgeon: Elsie Saas, MD;  [facility];  Service: Orthopedics;  Laterality: Right;  . WISDOM TOOTH EXTRACTION      There were no vitals filed for this visit.   Subjective Assessment - 05/29/20 0846    Subjective Feeling better with cheif compliaint being AM stiffness of knee. Patient able to ambulate without AD today    Patient is accompained by: Family member    Pertinent History lumbar fusion earlier this year    Limitations Standing;Lifting;Walking;House hold activities    How long can you sit comfortably? no difficulty    How long can you stand comfortably? a couple minutes    How long can you walk comfortably? a couple minutres    Patient Stated Goals to be able to walk without pain    Currently in Pain? No/denies    Pain Score 0-No pain    Pain Onset 1 to 4 weeks ago              [facility] PT Assessment - 05/29/20 0001      Assessment   Medical Diagnosis R TKA    Referring Provider (PT) Audree Camel. [place]    Onset Date/Surgical Date 05/11/20  AROM   AROM Assessment Site Knee    Right/Left Knee Right    Right Knee Extension 5   lacking   Right Knee Flexion 124                         [facility] Adult PT Treatment/Exercise - 05/29/20 0001      Ambulation/Gait   Ambulation/Gait Yes    Ambulation/Gait Assistance 7: Independent    Ambulation Distance (Feet) 150 Feet   forward/retro to improve knee extension initial contact   Gait Pattern Poor foot clearance - right    Stairs Yes    Stairs Assistance 6: Modified independent (Device/Increase time)    Stair Management Technique Two rails   emphasis on RLE extension for puhs-off     Knee/Hip Exercises: Aerobic   Recumbent Bike 5 min seat 12 with full revolutions foward-backwards      Knee/Hip Exercises: Standing   Knee Flexion Strengthening;Both;3 sets;10 reps     Lateral Step Up Right;2 sets;10 reps;Step Height: 6"    Lateral Step Up Limitations eccentric control    Other Standing Knee Exercises stair taps x 2 min   3 lbs     Knee/Hip Exercises: Seated   Long Arc Quad 4 sets;10 reps;Weights   3 lbs     Knee/Hip Exercises: Supine   Terminal Knee Extension AROM;3 sets;10 reps      Knee/Hip Exercises: Prone   Other Prone Exercises prone knee hang 2x2 min                  PT Education - 05/29/20 0846    Education Details eduacted on HEP additions and time/intensity for cycling at home    Person(s) Educated Patient    Methods Explanation    Comprehension Verbalized understanding            PT Short Term Goals - 05/29/20 0909      PT SHORT TERM GOAL #1   Title Patient will demonstrate increased knee extension to lacking 5 degrees to increased stride length.    Baseline lacking 15 degrees    Time 3    Period Weeks    Status Achieved    Target Date 06/03/20      PT SHORT TERM GOAL #2   Title Patient will demonstrate increased knee flexion of 115 degrees    Baseline 60-84 with AAROM using belt    Time 3    Period Weeks    Status Achieved    Target Date 06/03/20      PT SHORT TERM GOAL #3   Title Patient will demonstrate 100% HEP recall for R knee ROM/strengthening exercises to improve independent self-progression    Baseline in progress    Time 3    Period Weeks    Status On-going    Target Date 06/03/20      PT SHORT TERM GOAL #4   Title Patient will be able to ambulate 300 ft with SPC to reduce need for AD to increase independence    Baseline RW with heavy, antalgic 3-point pattern    Time 3    Period Weeks    Status On-going             PT Long Term Goals - 05/13/20 1143      PT LONG TERM GOAL #1   Title Patient will demonstrate increased knee extension to 0 degrees to increased stride length    Baseline lacking 15  Time 6    Period Weeks    Status New    Target Date 06/24/20      PT LONG TERM  GOAL #2   Title Patient will demonstrate increased knee flexion of 120 degrees to be able to squat to the ground to work in garden    Baseline 60 degrees R knee flexion    Time 6    Period Weeks    Status New    Target Date 06/24/20      PT LONG TERM GOAL #3   Title Patient will demonstrate independent ambulation over various surfaces to reduce use of AD    Baseline RW with 3 point pattern, level surfaces    Time 6    Period Weeks    Status New    Target Date 06/24/20      PT LONG TERM GOAL #4   Title Patient will demonstrate modified independent stair ambulation with single HR and reciprocal pattern to improve independence and safety with home environment    Baseline CGA with non-reciprocal pattern    Time 6    Period Weeks    Status New    Target Date 06/24/20      PT LONG TERM GOAL #5   Title Patient will improve on FOTO score to meet predicted outcomes to demonstrate improved functional mobility.                 Plan - 05/29/20 0851    Clinical Impression Statement Patient demonstrates improve activity tolerance requiring less rest periods. Able to ambulate without AD but demo instances of right toe drag due to decreased clearance in swing phase.  Continue with treatment to improve gait mechanics and increased quad strength to improve stair negotiation. Progressing well with POC details and has met two STG at this time and progressing with remaining.    Personal Factors and Comorbidities Comorbidity 2;Comorbidity 1    Comorbidities hx of cervical and lumbar surgery (lumbar fusion august of this year)    Examination-Activity Limitations Bed Mobility;Bend;Carry;Dressing;Locomotion Level;Squat;Stairs;Stand;Toileting;Transfers    Examination-Participation Restrictions Community Activity;Driving;Laundry;Shop;Yard Work    [employer] officer Stable/Uncomplicated    Rehab Potential Excellent    PT Frequency 3x / week    PT Duration 6 weeks    PT  Treatment/Interventions Electrical Stimulation;Moist Heat;Ultrasound;DME Instruction;Gait training;Stair training;Functional mobility training;Therapeutic activities;Therapeutic exercise;Balance training;Patient/family education;Manual techniques;Passive range of motion;Taping;ADLs/Self Care Home Management;Neuromuscular re-education;Scar mobilization;Dry needling;Joint Manipulations    PT Next Visit Plan Increase ROM and strengthening exercises for R knee to increase HEP development    PT Home Exercise Plan 12/1: Heel slides and seated knee flexion stretch; 12/3: quad sets and supine heel slides 12/6 mini squat, heel slides, SLR, 12/10: sit to stand from lower seat heights, lunge stretch with stair, wall sits 12/15 lateral step down    Consulted and Agree with Plan of Care Patient           Patient will benefit from skilled therapeutic intervention in order to improve the following deficits and impairments:  Abnormal gait,Decreased activity tolerance,Decreased balance,Decreased coordination,Decreased endurance,Decreased mobility,Decreased range of motion,Decreased strength,Difficulty walking,Pain,Decreased skin integrity,Increased edema,Decreased scar mobility  Visit Diagnosis: Difficulty in walking, not elsewhere classified  Muscle weakness (generalized)  Other abnormalities of gait and mobility     Problem List Patient Active Problem List   Diagnosis Date Noted  . Primary localized osteoarthritis of right knee 05/04/2020  . Lumbar adjacent segment disease with spondylolisthesis 02/06/2020  . Chest pressure 09/27/2019  . Spinal  stenosis of lumbar region with neurogenic claudication 04/26/2018  . Morbid obesity ([place]) 05/08/2015  . DJD (degenerative joint disease) of knee 07/14/2014  . Acute upper respiratory infection 06/12/2014  . Cigarette smoker 06/12/2014  . Cough 06/12/2014  . Primary localized osteoarthritis of left knee   . Hyperlipidemia   . GERD (gastroesophageal reflux  disease)   . Back pain   . Dyspnea 05/07/2014  . Multiple pulmonary nodules 05/07/2014    9:16 AM, 05/29/20 M. Sherlyn Lees, PT, DPT Physical Therapist- [place] Office Number: [phone]  [facility] [address] [place], [place], [address] Phone: [phone]   Fax:  [phone]  Name: Patrick Mathews MRN: [id number] Date of Birth: 1950/12/11

## 2020-06-01 ENCOUNTER — Ambulatory Visit (HOSPITAL_COMMUNITY): Payer: Medicare Other | Admitting: Physical Therapy

## 2020-06-01 ENCOUNTER — Encounter (HOSPITAL_COMMUNITY): Payer: Self-pay | Admitting: Physical Therapy

## 2020-06-01 ENCOUNTER — Other Ambulatory Visit: Payer: Self-pay

## 2020-06-01 DIAGNOSIS — M6281 Muscle weakness (generalized): Secondary | ICD-10-CM | POA: Diagnosis not present

## 2020-06-01 DIAGNOSIS — R2689 Other abnormalities of gait and mobility: Secondary | ICD-10-CM

## 2020-06-01 DIAGNOSIS — R262 Difficulty in walking, not elsewhere classified: Secondary | ICD-10-CM

## 2020-06-01 NOTE — Therapy (Signed)
[place], [place], [address] Phone: [phone]   Fax:  [phone]  Physical Therapy Treatment  Patient Details  Name: Patrick Mathews MRN: [id number] Date of Birth: 11/06/1950 Referring Provider (PT): Audree Camel. Noemi Chapel   Encounter Date: 06/01/2020   PT End of Session - 06/01/20 1319    Visit Number 8    Number of Visits 18    Date for PT Re-Evaluation 06/24/20    Authorization Type medicare primary, [employer] secondary. follow medicare, no auth required    Progress Note Due on Visit 10    PT Start Time 1319    PT Stop Time 1358    PT Time Calculation (min) 39 min    Activity Tolerance Patient tolerated treatment well;Patient limited by pain;No increased pain    Behavior During Therapy WFL for tasks assessed/performed           Past Medical History:  Diagnosis Date  . Adjacent segment disease with spinal stenosis   . Arthritis    "knees; back" (07/14/2014)  . Asthma   . CAD (coronary artery disease)   . Chest pain   . Exertional dyspnea   . GERD (gastroesophageal reflux disease)   . Hyperlipidemia   . Hypertension   . Pneumonia 11/2013  . Primary localized osteoarthritis of left knee   . Primary localized osteoarthritis of right knee 05/04/2020  . Seasonal allergies     Past Surgical History:  Procedure Laterality Date  . ANTERIOR CERVICAL DECOMP/DISCECTOMY FUSION  2009   Dr Joya Salm  . BACK SURGERY    . BUNIONECTOMY Right  2013  . COLONOSCOPY N/A 11/06/2012   Procedure: COLONOSCOPY;  Surgeon: Jamesetta So, MD;  [facility];  Service: Gastroenterology;  Laterality: N/A;  . FOOT SURGERY    . JOINT REPLACEMENT    . KNEE ARTHROSCOPY Bilateral 1990's  . [place]; 2009   Dr Joya Salm  . TONSILLECTOMY  1950's  . TOTAL KNEE ARTHROPLASTY Left 07/14/2014  . TOTAL KNEE ARTHROPLASTY Left 07/14/2014   Procedure: LEFT TOTAL KNEE ARTHROPLASTY;  Surgeon: [name], MD;   Location: [facility];  Service: Orthopedics;  Laterality: Left;  . TOTAL KNEE ARTHROPLASTY Right 05/11/2020   Procedure: TOTAL KNEE ARTHROPLASTY;  Surgeon: Elsie Saas, MD;  Location: [facility];  Service: Orthopedics;  Laterality: Right;  . WISDOM TOOTH EXTRACTION      There were no vitals filed for this visit.   Subjective Assessment - 06/01/20 1320    Subjective He is still working to get his stomach figured out. Patient states his knee has been feeling good. his knee is still tight sometimes. He has difficulty with putting on his socks.    Patient is accompained by: Family member    Pertinent History lumbar fusion earlier this year    Limitations Standing;Lifting;Walking;House hold activities    How long can you sit comfortably? no difficulty    How long can you stand comfortably? a couple minutes    How long can you walk comfortably? a couple minutres    Patient Stated Goals to be able to walk without pain    Currently in Pain? No/denies    Pain Onset 1 to 4 weeks ago                             [facility] Adult PT Treatment/Exercise - 06/01/20 0001  Knee/Hip Exercises: Aerobic   Recumbent Bike 5 min seat 12 with full revolutions foward-backwards      Knee/Hip Exercises: Machines for Strengthening   Cybex Leg Press 1x 15 5 plates; 2x15 7 plates      Knee/Hip Exercises: Standing   Heel Raises Both;20 reps    Heel Raises Limitations on step    Lateral Step Up Right;2 sets;10 reps;Step Height: 6"    Lateral Step Up Limitations eccentric control    Step Down Right;3 sets;10 reps;Hand Hold: 1;Step Height: 6"    Step Down Limitations eccentric control    Other Standing Knee Exercises heel and toe walking 4x 15 feet    Other Standing Knee Exercises hip slide outs 3x 10      Knee/Hip Exercises: Supine   Knee Extension AROM    Knee Extension Limitations lacking 3    Knee Flexion AROM    Knee Flexion Limitations 121                  PT Education -  06/01/20 1324    Education Details Patient educated on HEP, exercise mechanics    Person(s) Educated Patient    Methods Explanation;Demonstration    Comprehension Verbalized understanding;Returned demonstration            PT Short Term Goals - 05/29/20 0909      PT SHORT TERM GOAL #1   Title Patient will demonstrate increased knee extension to lacking 5 degrees to increased stride length.    Baseline lacking 15 degrees    Time 3    Period Weeks    Status Achieved    Target Date 06/03/20      PT SHORT TERM GOAL #2   Title Patient will demonstrate increased knee flexion of 115 degrees    Baseline 60-84 with AAROM using belt    Time 3    Period Weeks    Status Achieved    Target Date 06/03/20      PT SHORT TERM GOAL #3   Title Patient will demonstrate 100% HEP recall for R knee ROM/strengthening exercises to improve independent self-progression    Baseline in progress    Time 3    Period Weeks    Status On-going    Target Date 06/03/20      PT SHORT TERM GOAL #4   Title Patient will be able to ambulate 300 ft with SPC to reduce need for AD to increase independence    Baseline RW with heavy, antalgic 3-point pattern    Time 3    Period Weeks    Status On-going             PT Long Term Goals - 05/13/20 1143      PT LONG TERM GOAL #1   Title Patient will demonstrate increased knee extension to 0 degrees to increased stride length    Baseline lacking 15    Time 6    Period Weeks    Status New    Target Date 06/24/20      PT LONG TERM GOAL #2   Title Patient will demonstrate increased knee flexion of 120 degrees to be able to squat to the ground to work in garden    Baseline 60 degrees R knee flexion    Time 6    Period Weeks    Status New    Target Date 06/24/20      PT LONG TERM GOAL #3   Title Patient will demonstrate independent  ambulation over various surfaces to reduce use of AD    Baseline RW with 3 point pattern, level surfaces    Time 6    Period  Weeks    Status New    Target Date 06/24/20      PT LONG TERM GOAL #4   Title Patient will demonstrate modified independent stair ambulation with single HR and reciprocal pattern to improve independence and safety with home environment    Baseline CGA with non-reciprocal pattern    Time 6    Period Weeks    Status New    Target Date 06/24/20      PT LONG TERM GOAL #5   Title Patient will improve on FOTO score to meet predicted outcomes to demonstrate improved functional mobility.                 Plan - 06/01/20 1320    Clinical Impression Statement Patient showing improving knee extension ROM and good knee flexion ROM. Educated patient on how to easily put socks on. Patient showing improving eccentric strength and motor control with lateral step down exercise. Patient able to progress to completing forward step down and only requires unilateral UE support with good mechanics. Patient progressing very well with physical therapy intervention. Patient will continue to benefit from skilled physical therapy in order to reduce impairment and improve function.    Personal Factors and Comorbidities Comorbidity 2;Comorbidity 1    Comorbidities hx of cervical and lumbar surgery (lumbar fusion august of this year)    Examination-Activity Limitations Bed Mobility;Bend;Carry;Dressing;Locomotion Level;Squat;Stairs;Stand;Toileting;Transfers    Examination-Participation Restrictions Community Activity;Driving;Laundry;Shop;Yard Work    [employer] Stable/Uncomplicated    Rehab Potential Excellent    PT Frequency 3x / week    PT Duration 6 weeks    PT Treatment/Interventions Electrical Stimulation;Moist Heat;Ultrasound;DME Instruction;Gait training;Stair training;Functional mobility training;Therapeutic activities;Therapeutic exercise;Balance training;Patient/family education;Manual techniques;Passive range of motion;Taping;ADLs/Self Care Home Management;Neuromuscular  re-education;Scar mobilization;Dry needling;Joint Manipulations    PT Next Visit Plan Increase ROM and strengthening exercises for R knee to increase HEP development    PT Home Exercise Plan 12/1: Heel slides and seated knee flexion stretch; 12/3: quad sets and supine heel slides 12/6 mini squat, heel slides, SLR, 12/10: sit to stand from lower seat heights, lunge stretch with stair, wall sits 12/15 lateral step down. 12/17 prone knee hang    Consulted and Agree with Plan of Care Patient           Patient will benefit from skilled therapeutic intervention in order to improve the following deficits and impairments:  Abnormal gait,Decreased activity tolerance,Decreased balance,Decreased coordination,Decreased endurance,Decreased mobility,Decreased range of motion,Decreased strength,Difficulty walking,Pain,Decreased skin integrity,Increased edema,Decreased scar mobility  Visit Diagnosis: Difficulty in walking, not elsewhere classified  Muscle weakness (generalized)  Other abnormalities of gait and mobility     Problem List Patient Active Problem List   Diagnosis Date Noted  . Primary localized osteoarthritis of right knee 05/04/2020  . Lumbar adjacent segment disease with spondylolisthesis 02/06/2020  . Chest pressure 09/27/2019  . 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  . Morbid obesity ([place]) 05/08/2015  . DJD (degenerative joint disease) of knee 07/14/2014  . Acute upper respiratory infection 06/12/2014  . Cigarette smoker 06/12/2014  . Cough 06/12/2014  . Primary localized osteoarthritis of left knee   . Hyperlipidemia   . GERD (gastroesophageal reflux disease)   . Back pain   . Dyspnea 05/07/2014  . Multiple pulmonary nodules 05/07/2014    1:57 PM, 06/01/20  Mearl Latin PT, DPT Physical Therapist at [place], [place],  [address] Phone: [phone]   Fax:  [phone]  Name: Patrick Mathews MRN: [id number] Date of Birth: Mar 27, 1951

## 2020-06-03 ENCOUNTER — Other Ambulatory Visit: Payer: Self-pay

## 2020-06-03 ENCOUNTER — Ambulatory Visit (HOSPITAL_COMMUNITY): Payer: Medicare Other

## 2020-06-03 ENCOUNTER — Encounter (HOSPITAL_COMMUNITY): Payer: Self-pay

## 2020-06-03 DIAGNOSIS — M6281 Muscle weakness (generalized): Secondary | ICD-10-CM

## 2020-06-03 DIAGNOSIS — R262 Difficulty in walking, not elsewhere classified: Secondary | ICD-10-CM

## 2020-06-03 DIAGNOSIS — R2689 Other abnormalities of gait and mobility: Secondary | ICD-10-CM | POA: Diagnosis not present

## 2020-06-03 NOTE — Therapy (Addendum)
[place], [place], [address] Phone: [phone]   Fax:  [phone]  Physical Therapy Treatment and Discharge Note  Patient Details  Name: Patrick Mathews MRN: [id number] Date of Birth: 06-12-1951 Referring Provider (PT): Audree Camel. Wainer  PHYSICAL THERAPY DISCHARGE SUMMARY  Visits from Start of Care: 9  Current functional level related to goals / functional outcomes: See below   Remaining deficits: See below   Education / Equipment: See below Plan: Patient agrees to discharge.  Patient goals were met. Patient is being discharged due to meeting the stated rehab goals.  ?????    9:02 AM, 06/04/20 Jerene Pitch, DPT Physical Therapy with [facility]  [phone] office    Encounter Date: 06/03/2020   PT End of Session - 06/03/20 1137    Visit Number 9    Number of Visits 18    Date for PT Re-Evaluation 06/24/20    Authorization Type medicare primary, [employer] secondary. follow medicare, no auth required    Progress Note Due on Visit 10    PT Start Time 1050    PT Stop Time 1133    PT Time Calculation (min) 43 min    Activity Tolerance Patient tolerated treatment well;Patient limited by pain    Behavior During Therapy WFL for tasks assessed/performed           Past Medical History:  Diagnosis Date  . Adjacent segment disease with spinal stenosis   . Arthritis    "knees; back" (07/14/2014)  . Asthma   . CAD (coronary artery disease)   . Chest pain   . Exertional dyspnea   . GERD (gastroesophageal reflux disease)   . Hyperlipidemia   . Hypertension   . Pneumonia 11/2013  . Primary localized osteoarthritis of left knee   . Primary localized osteoarthritis of right knee 05/04/2020  . Seasonal allergies     Past Surgical History:  Procedure Laterality Date  . ANTERIOR CERVICAL DECOMP/DISCECTOMY FUSION  2009   Dr Joya Salm  . BACK SURGERY    . BUNIONECTOMY Right  2013   . COLONOSCOPY N/A 11/06/2012   Procedure: COLONOSCOPY;  Surgeon: Jamesetta So, MD;  [facility];  Service: Gastroenterology;  Laterality: N/A;  . FOOT SURGERY    . JOINT REPLACEMENT    . KNEE ARTHROSCOPY Bilateral 1990's  . [place]; 2009   Dr Joya Salm  . TONSILLECTOMY  1950's  . TOTAL KNEE ARTHROPLASTY Left 07/14/2014  . TOTAL KNEE ARTHROPLASTY Left 07/14/2014   Procedure: LEFT TOTAL KNEE ARTHROPLASTY;  Surgeon: [name], MD;  Location: [facility];  Service: Orthopedics;  Laterality: Left;  . TOTAL KNEE ARTHROPLASTY Right 05/11/2020   Procedure: TOTAL KNEE ARTHROPLASTY;  Surgeon: Elsie Saas, MD;  [facility];  Service: Orthopedics;  Laterality: Right;  . WISDOM TOOTH EXTRACTION      There were no vitals filed for this visit.   Subjective Assessment - 06/03/20 1059    Subjective Pt stated his main problem right now is with stomach.  No reports of knee pain.  Stated it is getting easier to put on socks and shoes.    Pertinent History lumbar fusion earlier this year    Patient Stated Goals to be able to walk without pain    Currently in Pain? No/denies              [facility] PT Assessment - 06/03/20 0001  Assessment   Medical Diagnosis R TKA    Referring Provider (PT) Audree Camel. Wainer    Onset Date/Surgical Date 05/11/20    Next MD Visit 06/09/20      Observation/Other Assessments   Focus on Therapeutic Outcomes (FOTO)  75.6% function, 24% limitation   was 4% function     AROM   AROM Assessment Site Knee    Right/Left Knee Right    Right Knee Extension 3   was 15   Right Knee Flexion 125   was 60     Ambulation/Gait   Ambulation/Gait Yes    Ambulation/Gait Assistance 7: Independent    Ambulation Distance (Feet) 452 Feet   was 150   Gait Pattern Within Functional Limits    Stairs Yes    Stairs Assistance 5: Supervision    Stair Management Technique Alternating pattern;One rail Right;No rails    Height of Stairs 7                          [facility] Adult PT Treatment/Exercise - 06/03/20 0001      Ambulation/Gait   Number of Stairs 20   5RT     Knee/Hip Exercises: Machines for Strengthening   Cybex Leg Press 3x 8 7 plates, cueing for control      Knee/Hip Exercises: Standing   Terminal Knee Extension 10 reps    Theraband Level (Terminal Knee Extension) --   pillow into wall   Terminal Knee Extension Limitations 5" holds    Functional Squat 10 reps    Functional Squat Limitations 3D hip excursin    Stairs 7in reciprocal pattern    Gait Training 2MWT no AD, 452      Knee/Hip Exercises: Supine   Heel Slides 5 reps    Knee Extension AROM    Knee Extension Limitations lacking 3    Knee Flexion AROM    Knee Flexion Limitations 125                    PT Short Term Goals - 06/03/20 1102      PT SHORT TERM GOAL #1   Title Patient will demonstrate increased knee extension to lacking 5 degrees to increased stride length.    Baseline 06/03/20: lacking 3 degrees    Status Achieved      PT SHORT TERM GOAL #2   Title Patient will demonstrate increased knee flexion of 115 degrees    Baseline 06/03/20: [id number] degrees flexion; 60-84 AAROM using belt    Status Achieved      PT SHORT TERM GOAL #3   Title Patient will demonstrate 100% HEP recall for R knee ROM/strengthening exercises to improve independent self-progression    Baseline 06/03/20:  reports complaince iwth HEP daily, walking and stairs    Status Achieved      PT SHORT TERM GOAL #4   Title Patient will be able to ambulate 300 ft with SPC to reduce need for AD to increase independence    Baseline 06/03/20: 460f no AD gait mechanics WFL RW with heavy, antalgic 3-point pattern    Status Achieved             PT Long Term Goals - 06/03/20 1116      PT LONG TERM GOAL #1   Title Patient will demonstrate increased knee extension to 0 degrees to increased stride length    Baseline 06/03/20: lacking 3 degrees    Status  On-going  PT LONG TERM GOAL #2   Title Patient will demonstrate increased knee flexion of 120 degrees to be able to squat to the ground to work in garden    Baseline 06/03/20: AROM 3-125 degrees    Status Achieved      PT LONG TERM GOAL #3   Title Patient will demonstrate independent ambulation over various surfaces to reduce use of AD    Baseline 06/03/20: 2MWT 44f no AD;  was RW with 3 point pattern, level surfaces    Status Achieved      PT LONG TERM GOAL #4   Title Patient will demonstrate modified independent stair ambulation with single HR and reciprocal pattern to improve independence and safety with home environment    Baseline 06/03/20:  Able to demonstrate reciprocal pattern with 1 HR; CGA with non-reciprocal pattern    Status Achieved      PT LONG TERM GOAL #5   Title Patient will improve on FOTO score to meet predicted outcomes to demonstrate improved functional mobility.    Baseline 75.6% function, 24% limitation; was 4% function initial eval    Status Achieved                 Plan - 06/03/20 1310    Clinical Impression Statement Reviewed goals prior MD apt and progressing great.  Pt has met 4/4 STG and 4/5 LTGs.  Improved self perceived functional abilities noted with FOTO score improved to 76% (was 4%), pt reports compliance with HEP, stairs and walking program a home.  Pt ambulates without AD and gait mechaincs WFL, improved distance 2MWT to 452 (was 150).  AROM 3-125 degrees.  Pt educated on benefits with compression hose and educated on technqiues to increase ease donning as well as butler for assistance.  Reviewed current HEP to continue upon DC.  Discussed returning to gym and to limit weight, keep low weight and controlled movements.  Verbalized understanding.    Personal Factors and Comorbidities Comorbidity 2;Comorbidity 1    Comorbidities hx of cervical and lumbar surgery (lumbar fusion august of this year)    Examination-Activity Limitations Bed  Mobility;Bend;Carry;Dressing;Locomotion Level;Squat;Stairs;Stand;Toileting;Transfers    Examination-Participation Restrictions Community Activity;Driving;Laundry;Shop;Yard Work    Stability/Clinical Decision Making Stable/Uncomplicated    Clinical Decision Making Low    Rehab Potential Excellent    PT Frequency 3x / week    PT Duration 6 weeks    PT Treatment/Interventions Electrical Stimulation;Moist Heat;Ultrasound;DME Instruction;Gait training;Stair training;Functional mobility training;Therapeutic activities;Therapeutic exercise;Balance training;Patient/family education;Manual techniques;Passive range of motion;Taping;ADLs/Self Care Home Management;Neuromuscular re-education;Scar mobilization;Dry needling;Joint Manipulations    PT Next Visit Plan DC to HEP.    PT Home Exercise Plan 12/1: Heel slides and seated knee flexion stretch; 12/3: quad sets and supine heel slides 12/6 mini squat, heel slides, SLR, 12/10: sit to stand from lower seat heights, lunge stretch with stair, wall sits 12/15 lateral step down. 12/17 prone knee hang; 12/22: TKE, squats           Patient will benefit from skilled therapeutic intervention in order to improve the following deficits and impairments:  Abnormal gait,Decreased activity tolerance,Decreased balance,Decreased coordination,Decreased endurance,Decreased mobility,Decreased range of motion,Decreased strength,Difficulty walking,Pain,Decreased skin integrity,Increased edema,Decreased scar mobility  Visit Diagnosis: Difficulty in walking, not elsewhere classified  Muscle weakness (generalized)  Other abnormalities of gait and mobility     Problem List Patient Active Problem List   Diagnosis Date Noted  . Primary localized osteoarthritis of right knee 05/04/2020  . Lumbar adjacent segment disease with spondylolisthesis 02/06/2020  .  Chest pressure 09/27/2019  . 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  . Morbid  obesity ([place]) 05/08/2015  . DJD (degenerative joint disease) of knee 07/14/2014  . Acute upper respiratory infection 06/12/2014  . Cigarette smoker 06/12/2014  . Cough 06/12/2014  . Primary localized osteoarthritis of left knee   . Hyperlipidemia   . GERD (gastroesophageal reflux disease)   . Back pain   . Dyspnea 05/07/2014  . Multiple pulmonary nodules 05/07/2014   Ihor Austin, LPTA/CLT; CBIS [phone]  Aldona Lento 06/03/2020, 2:21 PM  [place], [place], [address] Phone: [phone]   Fax:  [phone]  Name: Patrick Mathews MRN: [id number] Date of Birth: 29-Jan-1951

## 2020-06-04 ENCOUNTER — Ambulatory Visit (HOSPITAL_COMMUNITY): Payer: Medicare Other

## 2020-06-08 ENCOUNTER — Ambulatory Visit (HOSPITAL_COMMUNITY): Payer: Medicare Other | Admitting: Physical Therapy

## 2020-06-09 DIAGNOSIS — M1711 Unilateral primary osteoarthritis, right knee: Secondary | ICD-10-CM | POA: Diagnosis not present

## 2020-06-10 ENCOUNTER — Encounter (HOSPITAL_BASED_OUTPATIENT_CLINIC_OR_DEPARTMENT_OTHER): Payer: Self-pay | Admitting: Emergency Medicine

## 2020-06-10 ENCOUNTER — Other Ambulatory Visit: Payer: Self-pay

## 2020-06-10 ENCOUNTER — Emergency Department (HOSPITAL_BASED_OUTPATIENT_CLINIC_OR_DEPARTMENT_OTHER)
Admission: EM | Admit: 2020-06-10 | Discharge: 2020-06-10 | Disposition: A | Discharge status: Home / Self Care | Payer: Medicare Other | Attending: Emergency Medicine | Admitting: Emergency Medicine

## 2020-06-10 ENCOUNTER — Ambulatory Visit (HOSPITAL_COMMUNITY): Payer: Medicare Other

## 2020-06-10 ENCOUNTER — Emergency Department (HOSPITAL_BASED_OUTPATIENT_CLINIC_OR_DEPARTMENT_OTHER): Payer: Medicare Other

## 2020-06-10 DIAGNOSIS — Z7982 Long term (current) use of aspirin: Secondary | ICD-10-CM | POA: Insufficient documentation

## 2020-06-10 DIAGNOSIS — Z96651 Presence of right artificial knee joint: Secondary | ICD-10-CM | POA: Diagnosis not present

## 2020-06-10 DIAGNOSIS — R1013 Epigastric pain: Secondary | ICD-10-CM | POA: Diagnosis not present

## 2020-06-10 DIAGNOSIS — I2510 Atherosclerotic heart disease of native coronary artery without angina pectoris: Secondary | ICD-10-CM | POA: Insufficient documentation

## 2020-06-10 DIAGNOSIS — I10 Essential (primary) hypertension: Secondary | ICD-10-CM | POA: Insufficient documentation

## 2020-06-10 DIAGNOSIS — R109 Unspecified abdominal pain: Secondary | ICD-10-CM | POA: Diagnosis not present

## 2020-06-10 DIAGNOSIS — F17290 Nicotine dependence, other tobacco product, uncomplicated: Secondary | ICD-10-CM | POA: Insufficient documentation

## 2020-06-10 DIAGNOSIS — J45909 Unspecified asthma, uncomplicated: Secondary | ICD-10-CM | POA: Insufficient documentation

## 2020-06-10 DIAGNOSIS — K219 Gastro-esophageal reflux disease without esophagitis: Secondary | ICD-10-CM | POA: Insufficient documentation

## 2020-06-10 DIAGNOSIS — R110 Nausea: Secondary | ICD-10-CM | POA: Diagnosis not present

## 2020-06-10 DIAGNOSIS — R634 Abnormal weight loss: Secondary | ICD-10-CM | POA: Diagnosis not present

## 2020-06-10 DIAGNOSIS — Z79899 Other long term (current) drug therapy: Secondary | ICD-10-CM | POA: Diagnosis not present

## 2020-06-10 LAB — CBC
HCT: 42.3 % (ref 39.0–52.0)
Hemoglobin: 14.4 g/dL (ref 13.0–17.0)
MCH: 31.1 pg (ref 26.0–34.0)
MCHC: 34 g/dL (ref 30.0–36.0)
MCV: 91.4 fL (ref 80.0–100.0)
Platelets: 243 10*3/uL (ref 150–400)
RBC: 4.63 MIL/uL (ref 4.22–5.81)
RDW: 13.2 % (ref 11.5–15.5)
WBC: 5.7 10*3/uL (ref 4.0–10.5)
nRBC: 0 % (ref 0.0–0.2)

## 2020-06-10 LAB — URINALYSIS, ROUTINE W REFLEX MICROSCOPIC
Bilirubin Urine: NEGATIVE
Glucose, UA: NEGATIVE mg/dL
Hgb urine dipstick: NEGATIVE
Ketones, ur: 15 mg/dL — AB
Leukocytes,Ua: NEGATIVE
Nitrite: NEGATIVE
Protein, ur: NEGATIVE mg/dL
Specific Gravity, Urine: 1.015 (ref 1.005–1.030)
pH: 7 (ref 5.0–8.0)

## 2020-06-10 LAB — COMPREHENSIVE METABOLIC PANEL
ALT: 36 U/L (ref 0–44)
AST: 24 U/L (ref 15–41)
Albumin: 4.3 g/dL (ref 3.5–5.0)
Alkaline Phosphatase: 105 U/L (ref 38–126)
Anion gap: 10 (ref 5–15)
BUN: 15 mg/dL (ref 8–23)
CO2: 25 mmol/L (ref 22–32)
Calcium: 9.4 mg/dL (ref 8.9–10.3)
Chloride: 102 mmol/L (ref 98–111)
Creatinine, Ser: 1.06 mg/dL (ref 0.61–1.24)
GFR, Estimated: >60 mL/min (ref >60)
Glucose, Bld: 110 mg/dL — ABNORMAL HIGH (ref 70–99)
Potassium: 4.4 mmol/L (ref 3.5–5.1)
Sodium: 137 mmol/L (ref 135–145)
Total Bilirubin: 0.7 mg/dL (ref 0.3–1.2)
Total Protein: 7.3 g/dL (ref 6.5–8.1)

## 2020-06-10 LAB — LIPASE, BLOOD: Lipase: 27 U/L (ref 11–51)

## 2020-06-10 MED ORDER — LIDOCAINE VISCOUS HCL 2 % MT SOLN
15.0000 mL | Freq: Once | OROMUCOSAL | Status: AC
  Administered 2020-06-10: 14:00:00 15 mL via ORAL
  Filled 2020-06-10: qty 15

## 2020-06-10 MED ORDER — IOHEXOL 300 MG/ML  SOLN
100.0000 mL | Freq: Once | INTRAMUSCULAR | Status: AC
  Administered 2020-06-10: 14:00:00 100 mL via INTRAVENOUS

## 2020-06-10 MED ORDER — HYDROXYZINE HCL 25 MG PO TABS: 25.0000 mg | ORAL_TABLET | Freq: Every day | ORAL | 0 refills | Status: DC

## 2020-06-10 MED ORDER — ALUM & MAG HYDROXIDE-SIMETH 200-200-20 MG/5ML PO SUSP
30.0000 mL | Freq: Once | ORAL | Status: AC
  Administered 2020-06-10: 14:00:00 30 mL via ORAL
  Filled 2020-06-10: qty 30

## 2020-06-10 MED ORDER — HYOSCYAMINE SULFATE SL 0.125 MG SL SUBL: SUBLINGUAL_TABLET | SUBLINGUAL | 0 refills | Status: DC

## 2020-06-10 MED ORDER — ONDANSETRON HCL 4 MG PO TABS: 4.0000 mg | ORAL_TABLET | Freq: Three times a day (TID) | ORAL | 0 refills | Status: DC | PRN

## 2020-06-10 MED ORDER — RANITIDINE HCL 75 MG PO TABS: 150.0000 mg | ORAL_TABLET | Freq: Two times a day (BID) | ORAL | 0 refills | Status: DC

## 2020-06-10 MED ORDER — ONDANSETRON HCL 4 MG/2ML IJ SOLN
4.0000 mg | Freq: Once | INTRAMUSCULAR | Status: AC
  Administered 2020-06-10: 14:00:00 4 mg via INTRAVENOUS
  Filled 2020-06-10: qty 2

## 2020-06-10 NOTE — Discharge Instructions (Addendum)
Please call your gastroenterologist and let them know you were seen in the emergency department we recommend close follow-up with them for a EGD if they deem appropriate as soon as possible.  Please stay away from any high acid food.

## 2020-06-10 NOTE — ED Triage Notes (Signed)
abd pain mid section   Since nov 29 after knee surgery, was given pain meds and it messed up his stomach and he has frequency and urgency  When he pees  Had bm yesterday he states

## 2020-06-10 NOTE — ED Provider Notes (Addendum)
[facility] EMERGENCY DEPARTMENT Provider Note   CSN: [id number] Arrival date & time: 06/10/20  [id number]     History Chief Complaint  Patient presents with  . Abdominal Pain   HPI   Blood pressure (!) 154/123, pulse 62, temperature (!) 97.5 F (36.4 C), temperature source Oral, resp. rate 17, height 5\' 10"  (1.778 m), weight 92.1 kg, SpO2 100 %.  Patrick Mathews is a 69 y.o. male complaining of severe and worsening epigastric pain worsening over the course of the last month.  Patient states that initially he had a knee replacement on 30 November.  He started on opioids and this caused severe constipation but since that time he DC'd them about a week afterwards, he has had nausea and he states that he is lost about 20 pounds since that time.  He denies any melena, hematochezia.  He endorses nausea with no vomiting but states he has no appetite.  He seen [facility], they plan for blood work and upper endoscopy.  He started on Bentyl, pantoprazole, Mylanta with little relief.  He denies any fevers, chills, change in urination.     Past Medical History:  Diagnosis Date  . Adjacent segment disease with spinal stenosis   . Arthritis    "knees; back" (07/14/2014)  . Asthma   . CAD (coronary artery disease)   . Chest pain   . Exertional dyspnea   . GERD (gastroesophageal reflux disease)   . Hyperlipidemia   . Hypertension   . Pneumonia 11/2013  . Primary localized osteoarthritis of left knee   . Primary localized osteoarthritis of right knee 05/04/2020  . Seasonal allergies     Patient Active Problem List   Diagnosis Date Noted  . Primary localized osteoarthritis of right knee 05/04/2020  . Lumbar adjacent segment disease with spondylolisthesis 02/06/2020  . Chest pressure 09/27/2019  . 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  . Morbid obesity ([facility]) 05/08/2015  . DJD (degenerative joint disease) of knee 07/14/2014  . Acute upper respiratory  infection 06/12/2014  . Cigarette smoker 06/12/2014  . Cough 06/12/2014  . Primary localized osteoarthritis of left knee   . Hyperlipidemia   . GERD (gastroesophageal reflux disease)   . Back pain   . Dyspnea 05/07/2014  . Multiple pulmonary nodules 05/07/2014    Past Surgical History:  Procedure Laterality Date  . ANTERIOR CERVICAL DECOMP/DISCECTOMY FUSION  2009   Dr Joya Salm  . BACK SURGERY    . BUNIONECTOMY Right  2013  . COLONOSCOPY N/A 11/06/2012   Procedure: COLONOSCOPY;  Surgeon: Jamesetta So, MD;  Location: [facility];  Service: Gastroenterology;  Laterality: N/A;  . FOOT SURGERY    . JOINT REPLACEMENT    . KNEE ARTHROSCOPY Bilateral 1990's  . [place]; 2009   Dr Joya Salm  . TONSILLECTOMY  1950's  . TOTAL KNEE ARTHROPLASTY Left 07/14/2014  . TOTAL KNEE ARTHROPLASTY Left 07/14/2014   Procedure: LEFT TOTAL KNEE ARTHROPLASTY;  Surgeon: [name], MD;  Location: [facility];  Service: Orthopedics;  Laterality: Left;  . TOTAL KNEE ARTHROPLASTY Right 05/11/2020   Procedure: TOTAL KNEE ARTHROPLASTY;  Surgeon: Elsie Saas, MD;  Location: [facility];  Service: Orthopedics;  Laterality: Right;  . WISDOM TOOTH EXTRACTION         Family History  Problem Relation Age of Onset  . CAD Mother   . CVA Mother   . Alzheimer's disease Mother   . Dementia Father   .  Alzheimer's disease Father   . Stroke Maternal Grandmother   . Heart attack Maternal Grandfather   . CAD Maternal Grandfather   . Stroke Paternal Grandmother   . Colon cancer Neg Hx   . Colon polyps Neg Hx     Social History   Tobacco Use  . Smoking status: Current Some Day Smoker    Years: 30.00    Types: Cigars  . Smokeless tobacco: Never Used  . Tobacco comment: 07/14/2014 smoked cigs on and off x 40 yrs- none since 2014, but does smoke occ cigar-  Vaping Use  . Vaping Use: Never used  Substance Use Topics  . Alcohol use: Yes    Alcohol/week: 0.0 standard drinks    Comment: occ  . Drug  use: No    Home Medications Prior to Admission medications   Medication Sig Start Date End Date Taking? Authorizing Provider  hydrOXYzine (ATARAX/VISTARIL) 25 MG tablet Take 1 tablet (25 mg total) by mouth at bedtime. 06/10/20  Yes Ahman Dugdale, Elmyra Ricks, PA-C  Hyoscyamine Sulfate SL (LEVSIN/SL) 0.125 MG SUBL 0.125 to 0.250 SL q4hrs PRN Do not exceed 1.5 mg/day. 06/10/20  Yes Aubra Pappalardo, Elmyra Ricks, PA-C  ondansetron (ZOFRAN) 4 MG tablet Take 1 tablet (4 mg total) by mouth every 8 (eight) hours as needed for nausea or vomiting. 06/10/20  Yes Chenay Nesmith, Elmyra Ricks, PA-C  ranitidine (ZANTAC) 75 MG tablet Take 2 tablets (150 mg total) by mouth 2 (two) times daily. 06/10/20  Yes Lyndsay Talamante, Elmyra Ricks, PA-C  acetaminophen (TYLENOL) 325 MG tablet Take 650 mg by mouth every 6 (six) hours as needed for mild pain or moderate pain.    [provider]  aspirin EC 325 MG EC tablet Take 1 tablet (325 mg total) by mouth daily with breakfast. 05/13/20   Shepperson, Kirstin, PA-C  cyclobenzaprine (FLEXERIL) 10 MG tablet Take 1 tablet (10 mg total) by mouth 3 (three) times daily as needed for muscle spasms. 02/07/20   Newman Pies, MD  docusate sodium (COLACE) 100 MG capsule Take 1 capsule (100 mg total) by mouth 2 (two) times daily. Patient taking differently: Take 100 mg by mouth daily as needed for mild constipation or moderate constipation.  02/07/20   Newman Pies, MD  gabapentin (NEURONTIN) 300 MG capsule Take 300 mg by mouth daily as needed (pain).  05/24/19   [provider]  gabapentin (NEURONTIN) 600 MG tablet Take 600 mg by mouth at bedtime.    [provider]  nitroGLYCERIN (NITROSTAT) 0.4 MG SL tablet Place 1 tablet (0.4 mg total) under the tongue every 5 (five) minutes as needed for chest pain. 07/11/19   Nahser, Wonda Cheng, MD  omeprazole (PRILOSEC) 20 MG capsule Take 20 mg by mouth daily before breakfast.    [provider]  oxyCODONE (OXY IR/ROXICODONE) 5 MG immediate release  tablet 1 po q 4 hrs prn pain 05/12/20   Shepperson, Kirstin, PA-C  polyethylene glycol (MIRALAX / GLYCOLAX) 17 g packet 17grams in 8 oz of something to drink twice a day until bowel movement.  LAXITIVE.  Restart if two days since last bowel movement 05/12/20   Shepperson, Kirstin, PA-C  rosuvastatin (CRESTOR) 10 MG tablet Take 10 mg by mouth daily.    [provider]  sildenafil (VIAGRA) 100 MG tablet Take 100 mg by mouth daily as needed for erectile dysfunction.  04/21/20   [provider]  tadalafil (CIALIS) 5 MG tablet Take 5 mg by mouth daily as needed for erectile dysfunction.  06/24/19   [provider]  trolamine salicylate (BLUE-EMU HEMP) 10 % cream Apply 1 application topically daily as needed for muscle pain.    [provider]    Allergies    Morphine and related and Sulfa antibiotics  Review of Systems   Review of Systems   A complete review of systems was obtained and all systems are negative except as noted in the HPI and PMH.   Physical Exam Updated Vital Signs BP 138/74 (BP Location: Left Arm)   Pulse 64   Temp (!) 97.5 F (36.4 C) (Oral)   Resp 18   Ht 5\' 10"  (1.778 m)   Wt 92.1 kg   SpO2 100%   BMI 29.13 kg/m   Physical Exam Vitals and nursing note reviewed.  Constitutional:      General: He is not in acute distress.    Appearance: He is well-developed and well-nourished. He is not diaphoretic.  HENT:     Head: Normocephalic and atraumatic.     Mouth/Throat:     Mouth: Oropharynx is clear and moist.  Eyes:     Extraocular Movements: EOM normal.     Conjunctiva/sclera: Conjunctivae normal.     Pupils: Pupils are equal, round, and reactive to light.  Cardiovascular:     Rate and Rhythm: Normal rate and regular rhythm.     Pulses: Intact distal pulses.  Pulmonary:     Effort: Pulmonary effort is normal.     Breath sounds: Normal breath sounds.  Abdominal:     General: Bowel sounds are normal.     Palpations: Abdomen is  soft.     Tenderness: There is no abdominal tenderness.     Comments: Mild tenderness in the epigastrium, no guarding or rebound.  Musculoskeletal:        General: Normal range of motion.     Cervical back: Normal range of motion.  Neurological:     Mental Status: He is alert and oriented to person, place, and time.  Psychiatric:        Mood and Affect: Mood and affect normal.     ED Results / Procedures / Treatments   Labs (all labs ordered are listed, but only abnormal results are displayed) Labs Reviewed  COMPREHENSIVE METABOLIC PANEL - Abnormal; Notable for the following components:      Result Value   Glucose, Bld 110 (*)    All other components within normal limits  URINALYSIS, ROUTINE W REFLEX MICROSCOPIC - Abnormal; Notable for the following components:   Ketones, ur 15 (*)    All other components within normal limits  LIPASE, BLOOD  CBC    EKG None  Radiology CT ABDOMEN PELVIS W CONTRAST  Result Date: 06/10/2020 CLINICAL DATA:  Epigastric and mid abdominal pain for 1 month. Urinary frequency and urgency. EXAM: CT ABDOMEN AND PELVIS WITH CONTRAST TECHNIQUE: Multidetector CT imaging of the abdomen and pelvis was performed using the standard protocol following bolus administration of intravenous contrast. CONTRAST:  148mL OMNIPAQUE IOHEXOL 300 MG/ML  SOLN COMPARISON:  05/25/2020 FINDINGS: Lower Chest: No acute findings. Hepatobiliary: 4 cm right hepatic lobe cyst and probable tiny sub-cm cysts in the left lobe are stable since prior exam. No hepatic masses identified. Gallbladder is unremarkable. No evidence of biliary ductal dilatation. Pancreas:  No mass or inflammatory changes. Spleen: Within normal limits in size and appearance. Adrenals/Urinary Tract: No masses identified. Stable 4 cm cyst in lower pole of left kidney. A few tiny less than 5 mm bilateral renal calculi are again  seen, however there is no evidence of ureteral calculi or hydronephrosis. Stomach/Bowel: No  evidence of obstruction, inflammatory process or abnormal fluid collections. Normal appendix visualized. Diverticulosis is seen mainly involving the descending and sigmoid colon, however there is no evidence of diverticulitis. Vascular/Lymphatic: No pathologically enlarged lymph nodes. No abdominal aortic aneurysm. Aortic atherosclerotic calcification noted. Reproductive:  No mass or other significant abnormality. Other:  None. Musculoskeletal: No suspicious bone lesions identified. Lumbar spine fusion hardware again seen from L2 through L5. IMPRESSION: Tiny bilateral renal calculi. No evidence of ureteral calculi, hydronephrosis, or other acute findings. Colonic diverticulosis. No radiographic evidence of diverticulitis. Aortic Atherosclerosis (ICD10-I70.0). Electronically Signed   By: Marlaine Hind M.D.   On: 06/10/2020 15:05    Procedures Procedures (including critical care time)  Medications Ordered in ED Medications  alum & mag hydroxide-simeth (MAALOX/MYLANTA) 200-200-20 MG/5ML suspension 30 mL (30 mLs Oral Given 06/10/20 1414)    And  lidocaine (XYLOCAINE) 2 % viscous mouth solution 15 mL (15 mLs Oral Given 06/10/20 1414)  ondansetron (ZOFRAN) injection 4 mg (4 mg Intravenous Given 06/10/20 1416)  iohexol (OMNIPAQUE) 300 MG/ML solution 100 mL (100 mLs Intravenous Contrast Given 06/10/20 1429)    ED Course  I have reviewed the triage vital signs and the nursing notes.  Pertinent labs & imaging results that were available during my care of the patient were reviewed by me and considered in my medical decision making (see chart for details).    MDM Rules/Calculators/A&P                          Vitals:   06/10/20 1418 06/10/20 1428 06/10/20 1521 06/10/20 1600  BP: (!) 146/71  (!) 143/67 138/74  Pulse: 62  65 64  Resp: 18  18   Temp:      TempSrc:      SpO2: 100% 100% 100% 100%  Weight:      Height:        Medications  alum & mag hydroxide-simeth (MAALOX/MYLANTA) 200-200-20  MG/5ML suspension 30 mL (30 mLs Oral Given 06/10/20 1414)    And  lidocaine (XYLOCAINE) 2 % viscous mouth solution 15 mL (15 mLs Oral Given 06/10/20 1414)  ondansetron (ZOFRAN) injection 4 mg (4 mg Intravenous Given 06/10/20 1416)  iohexol (OMNIPAQUE) 300 MG/ML solution 100 mL (100 mLs Intravenous Contrast Given 06/10/20 1429)    Patrick Mathews is 69 y.o. male presenting with severe and worsening epigastric pain and nausea.  He states that this started after his knee replacement surgery when he was taking narcotics which made him constipated.  He has seen gastroenterology for this, they started him on pantoprazole, Bentyl.  He states that the only thing that is helpful to him is Maalox.  I have a benign abdominal exam, I suspect peptic ulcer disease or H. pylori he, I have advised him that we will proceed forward with a repeat CAT scan but he will likely need to follow closely with gastroenterology, I am going to change to Levsin, I will add on ranitidine, Zofran.  Patient is not reporting any excessive NSAID use, no melena.  Wife is concerned that he is wearing himself too much and causing acid reflux so I will write him a prescription for Atarax but have advised him that this will make him drowsy, counseled him against fall precautions.  CT without acute findings, and any take him off of Bentyl, put him on Levsin, add in ranitidine, he will follow  closely with GI.  Evaluation does not show pathology that would require ongoing emergent intervention or inpatient treatment. Pt is hemodynamically stable and mentating appropriately. Discussed findings and plan with patient/guardian, who agrees with care plan. All questions answered. Return precautions discussed and outpatient follow up given.   Final Clinical Impression(s) / ED Diagnoses Final diagnoses:  Epigastric abdominal pain  Weight loss, unintentional    Rx / DC Orders ED Discharge Orders         Ordered    Hyoscyamine Sulfate SL  (LEVSIN/SL) 0.125 MG SUBL        06/10/20 1535    ranitidine (ZANTAC) 75 MG tablet  2 times daily        06/10/20 1535    ondansetron (ZOFRAN) 4 MG tablet  Every 8 hours PRN        06/10/20 1535    hydrOXYzine (ATARAX/VISTARIL) 25 MG tablet  Daily at bedtime        06/10/20 1555           Eilidh Marcano, Mardella Layman 06/10/20 1536    Fatisha Rabalais, [place], PA-C 06/10/20 1607    Sabas Sous, MD 06/12/20 [phone]

## 2020-06-11 ENCOUNTER — Ambulatory Visit (HOSPITAL_COMMUNITY): Payer: Medicare Other

## 2020-06-15 ENCOUNTER — Ambulatory Visit (HOSPITAL_COMMUNITY): Payer: Medicare Other | Admitting: Physical Therapy

## 2020-06-17 ENCOUNTER — Ambulatory Visit (HOSPITAL_COMMUNITY): Payer: Medicare Other

## 2020-06-17 DIAGNOSIS — L448 Other specified papulosquamous disorders: Secondary | ICD-10-CM | POA: Diagnosis not present

## 2020-06-17 DIAGNOSIS — Z0000 Encounter for general adult medical examination without abnormal findings: Secondary | ICD-10-CM | POA: Diagnosis not present

## 2020-06-17 DIAGNOSIS — G4739 Other sleep apnea: Secondary | ICD-10-CM | POA: Diagnosis not present

## 2020-06-17 DIAGNOSIS — R7309 Other abnormal glucose: Secondary | ICD-10-CM | POA: Diagnosis not present

## 2020-06-17 DIAGNOSIS — Z6830 Body mass index (BMI) 30.0-30.9, adult: Secondary | ICD-10-CM | POA: Diagnosis not present

## 2020-06-17 DIAGNOSIS — Z1331 Encounter for screening for depression: Secondary | ICD-10-CM | POA: Diagnosis not present

## 2020-06-18 DIAGNOSIS — K5900 Constipation, unspecified: Secondary | ICD-10-CM | POA: Diagnosis not present

## 2020-06-18 DIAGNOSIS — F419 Anxiety disorder, unspecified: Secondary | ICD-10-CM | POA: Diagnosis not present

## 2020-06-18 DIAGNOSIS — K219 Gastro-esophageal reflux disease without esophagitis: Secondary | ICD-10-CM | POA: Diagnosis not present

## 2020-06-18 DIAGNOSIS — R109 Unspecified abdominal pain: Secondary | ICD-10-CM | POA: Diagnosis not present

## 2020-06-18 DIAGNOSIS — D500 Iron deficiency anemia secondary to blood loss (chronic): Secondary | ICD-10-CM | POA: Diagnosis not present

## 2020-06-19 ENCOUNTER — Ambulatory Visit (HOSPITAL_COMMUNITY): Payer: Medicare Other

## 2020-07-07 DIAGNOSIS — M1711 Unilateral primary osteoarthritis, right knee: Secondary | ICD-10-CM | POA: Diagnosis not present

## 2020-07-11 DIAGNOSIS — M5127 Other intervertebral disc displacement, lumbosacral region: Secondary | ICD-10-CM | POA: Diagnosis not present

## 2020-07-11 DIAGNOSIS — E7849 Other hyperlipidemia: Secondary | ICD-10-CM | POA: Diagnosis not present

## 2020-07-15 DIAGNOSIS — Z01812 Encounter for preprocedural laboratory examination: Secondary | ICD-10-CM | POA: Diagnosis not present

## 2020-07-16 DIAGNOSIS — U071 COVID-19: Secondary | ICD-10-CM | POA: Diagnosis not present

## 2020-07-17 ENCOUNTER — Emergency Department (HOSPITAL_COMMUNITY): Payer: Medicare Other

## 2020-07-17 ENCOUNTER — Encounter (HOSPITAL_COMMUNITY): Payer: Self-pay

## 2020-07-17 ENCOUNTER — Other Ambulatory Visit: Payer: Self-pay

## 2020-07-17 ENCOUNTER — Emergency Department (HOSPITAL_COMMUNITY)
Admission: EM | Admit: 2020-07-17 | Discharge: 2020-07-17 | Disposition: A | Discharge status: Home / Self Care | Payer: Medicare Other | Attending: Emergency Medicine | Admitting: Emergency Medicine

## 2020-07-17 DIAGNOSIS — Z7982 Long term (current) use of aspirin: Secondary | ICD-10-CM | POA: Insufficient documentation

## 2020-07-17 DIAGNOSIS — R1013 Epigastric pain: Secondary | ICD-10-CM

## 2020-07-17 DIAGNOSIS — F17290 Nicotine dependence, other tobacco product, uncomplicated: Secondary | ICD-10-CM | POA: Insufficient documentation

## 2020-07-17 DIAGNOSIS — Z79899 Other long term (current) drug therapy: Secondary | ICD-10-CM | POA: Diagnosis not present

## 2020-07-17 DIAGNOSIS — M4317 Spondylolisthesis, lumbosacral region: Secondary | ICD-10-CM | POA: Diagnosis not present

## 2020-07-17 DIAGNOSIS — N200 Calculus of kidney: Secondary | ICD-10-CM | POA: Diagnosis not present

## 2020-07-17 DIAGNOSIS — I10 Essential (primary) hypertension: Secondary | ICD-10-CM | POA: Insufficient documentation

## 2020-07-17 DIAGNOSIS — K2970 Gastritis, unspecified, without bleeding: Secondary | ICD-10-CM | POA: Insufficient documentation

## 2020-07-17 DIAGNOSIS — R0602 Shortness of breath: Secondary | ICD-10-CM | POA: Diagnosis not present

## 2020-07-17 DIAGNOSIS — Z96653 Presence of artificial knee joint, bilateral: Secondary | ICD-10-CM | POA: Diagnosis not present

## 2020-07-17 DIAGNOSIS — K7689 Other specified diseases of liver: Secondary | ICD-10-CM | POA: Diagnosis not present

## 2020-07-17 DIAGNOSIS — J45909 Unspecified asthma, uncomplicated: Secondary | ICD-10-CM | POA: Insufficient documentation

## 2020-07-17 DIAGNOSIS — R1030 Lower abdominal pain, unspecified: Secondary | ICD-10-CM | POA: Diagnosis present

## 2020-07-17 DIAGNOSIS — I2510 Atherosclerotic heart disease of native coronary artery without angina pectoris: Secondary | ICD-10-CM | POA: Insufficient documentation

## 2020-07-17 DIAGNOSIS — K5730 Diverticulosis of large intestine without perforation or abscess without bleeding: Secondary | ICD-10-CM | POA: Diagnosis not present

## 2020-07-17 LAB — URINALYSIS, ROUTINE W REFLEX MICROSCOPIC
Bilirubin Urine: NEGATIVE
Glucose, UA: NEGATIVE mg/dL
Hgb urine dipstick: NEGATIVE
Ketones, ur: NEGATIVE mg/dL
Leukocytes,Ua: NEGATIVE
Nitrite: NEGATIVE
Protein, ur: NEGATIVE mg/dL
Specific Gravity, Urine: 1.005 (ref 1.005–1.030)
pH: 6 (ref 5.0–8.0)

## 2020-07-17 LAB — COMPREHENSIVE METABOLIC PANEL
ALT: 33 U/L (ref 0–44)
AST: 28 U/L (ref 15–41)
Albumin: 4 g/dL (ref 3.5–5.0)
Alkaline Phosphatase: 101 U/L (ref 38–126)
Anion gap: 8 (ref 5–15)
BUN: 15 mg/dL (ref 8–23)
CO2: 25 mmol/L (ref 22–32)
Calcium: 9.1 mg/dL (ref 8.9–10.3)
Chloride: 101 mmol/L (ref 98–111)
Creatinine, Ser: 1.42 mg/dL — ABNORMAL HIGH (ref 0.61–1.24)
GFR, Estimated: 53 mL/min — ABNORMAL LOW (ref >60)
Glucose, Bld: 99 mg/dL (ref 70–99)
Potassium: 3.8 mmol/L (ref 3.5–5.1)
Sodium: 134 mmol/L — ABNORMAL LOW (ref 135–145)
Total Bilirubin: 0.5 mg/dL (ref 0.3–1.2)
Total Protein: 7.2 g/dL (ref 6.5–8.1)

## 2020-07-17 LAB — CBC WITH DIFFERENTIAL/PLATELET
Abs Immature Granulocytes: 0.01 10*3/uL (ref 0.00–0.07)
Basophils Absolute: 0 10*3/uL (ref 0.0–0.1)
Basophils Relative: 1 %
Eosinophils Absolute: 0.1 10*3/uL (ref 0.0–0.5)
Eosinophils Relative: 1 %
HCT: 45.4 % (ref 39.0–52.0)
Hemoglobin: 15.7 g/dL (ref 13.0–17.0)
Immature Granulocytes: 0 %
Lymphocytes Relative: 13 %
Lymphs Abs: 0.6 10*3/uL — ABNORMAL LOW (ref 0.7–4.0)
MCH: 31.5 pg (ref 26.0–34.0)
MCHC: 34.6 g/dL (ref 30.0–36.0)
MCV: 91 fL (ref 80.0–100.0)
Monocytes Absolute: 0.6 10*3/uL (ref 0.1–1.0)
Monocytes Relative: 13 %
Neutro Abs: 3.1 10*3/uL (ref 1.7–7.7)
Neutrophils Relative %: 72 %
Platelets: 168 10*3/uL (ref 150–400)
RBC: 4.99 MIL/uL (ref 4.22–5.81)
RDW: 12.7 % (ref 11.5–15.5)
WBC: 4.3 10*3/uL (ref 4.0–10.5)
nRBC: 0 % (ref 0.0–0.2)

## 2020-07-17 LAB — LIPASE, BLOOD: Lipase: 28 U/L (ref 11–51)

## 2020-07-17 LAB — TROPONIN I (HIGH SENSITIVITY): Troponin I (High Sensitivity): 5 ng/L (ref <18)

## 2020-07-17 MED ORDER — ALUM & MAG HYDROXIDE-SIMETH 200-200-20 MG/5ML PO SUSP
30.0000 mL | Freq: Once | ORAL | Status: AC
  Administered 2020-07-17: 30 mL via ORAL
  Filled 2020-07-17: qty 30

## 2020-07-17 MED ORDER — FAMOTIDINE IN NACL 20-0.9 MG/50ML-% IV SOLN
20.0000 mg | Freq: Once | INTRAVENOUS | Status: AC
  Administered 2020-07-17: 20 mg via INTRAVENOUS
  Filled 2020-07-17: qty 50

## 2020-07-17 NOTE — ED Notes (Signed)
ED Provider at bedside. 

## 2020-07-17 NOTE — ED Triage Notes (Signed)
Pt says that he tested positive on Thursday. Pt states that he is having SOB and stomach pain. Pt denies fever.

## 2020-07-17 NOTE — ED Notes (Signed)
Pt given water and bed side urinal. Need for sample known.

## 2020-07-17 NOTE — ED Provider Notes (Signed)
[facility] EMERGENCY DEPARTMENT Provider Note   CSN: [id number] Arrival date & time: 07/17/20  [id number]     History Chief Complaint  Patient presents with  . Covid Positive    Patrick Mathews is a 70 y.o. male with chronic abdominal pain present emergency department with worsening of his abdominal pain.  The patient reports that he has been having cramping diffuse lower abdominal pain for the past several days, although upon further questioning reports is the same pain has been having for "months".  He presents emergency department upset because he reports that [facility], whom he is seeing, had to postpone his upcoming colonoscopy until May because he tested positive for COVID yesterday with routine screening.  He reports he has had 2 Covid vaccines.  He reports he has had "maybe some congestion symptoms" but denies otherwise cough, headache, sore throat.  He reports he does feel somewhat short of breath, although he reports this may be due to his abdominal pain.  He says the pain is constant and gnawing.  He says it is only better when he is standing up and pacing around.  He feels like his medications are prescribed by his GI doctors were helping, but not enough.  He reports he is having regular bowel movements, which are nonbloody, and he does not feel constipated.  He does also report that he is having some issues with urinary pressure frequency, which is unusual for him.  He says whenever he drinks a glass of water, he feels like he needs to pee and force it out.  The patient had CT scans, most recently 2 of these in December 2021.  These were notable for diverticulosis without evidence of diverticulitis.  He also had a 5 mm bilateral renal stones in the kidneys.  The gallbladder was unremarkable.  He has a history of lumbar spinal fusion from L2-L5.  He denies any other history of abdominal surgeries.  HPI     Past Medical History:  Diagnosis Date  . Adjacent segment disease with spinal  stenosis   . Arthritis    "knees; back" (07/14/2014)  . Asthma   . CAD (coronary artery disease)   . Chest pain   . Exertional dyspnea   . GERD (gastroesophageal reflux disease)   . Hyperlipidemia   . Hypertension   . Pneumonia 11/2013  . Primary localized osteoarthritis of left knee   . Primary localized osteoarthritis of right knee 05/04/2020  . Seasonal allergies     Patient Active Problem List   Diagnosis Date Noted  . Primary localized osteoarthritis of right knee 05/04/2020  . Lumbar adjacent segment disease with spondylolisthesis 02/06/2020  . Chest pressure 09/27/2019  . 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  . Morbid obesity ([place]) 05/08/2015  . DJD (degenerative joint disease) of knee 07/14/2014  . Acute upper respiratory infection 06/12/2014  . Cigarette smoker 06/12/2014  . Cough 06/12/2014  . Primary localized osteoarthritis of left knee   . Hyperlipidemia   . GERD (gastroesophageal reflux disease)   . Back pain   . Dyspnea 05/07/2014  . Multiple pulmonary nodules 05/07/2014    Past Surgical History:  Procedure Laterality Date  . ANTERIOR CERVICAL DECOMP/DISCECTOMY FUSION  2009   Dr Joya Salm  . BACK SURGERY    . BUNIONECTOMY Right  2013  . COLONOSCOPY N/A 11/06/2012   Procedure: COLONOSCOPY;  Surgeon: Jamesetta So, MD;  Location: [facility];  Service: Gastroenterology;  Laterality: N/A;  .  FOOT SURGERY    . JOINT REPLACEMENT    . KNEE ARTHROSCOPY Bilateral 1990's  . [place]; 2009   Dr Joya Salm  . TONSILLECTOMY  1950's  . TOTAL KNEE ARTHROPLASTY Left 07/14/2014  . TOTAL KNEE ARTHROPLASTY Left 07/14/2014   Procedure: LEFT TOTAL KNEE ARTHROPLASTY;  Surgeon: [name], MD;  Location: [facility];  Service: Orthopedics;  Laterality: Left;  . TOTAL KNEE ARTHROPLASTY Right 05/11/2020   Procedure: TOTAL KNEE ARTHROPLASTY;  Surgeon: Elsie Saas, MD;  Location: [facility];  Service: Orthopedics;  Laterality:  Right;  . WISDOM TOOTH EXTRACTION         Family History  Problem Relation Age of Onset  . CAD Mother   . CVA Mother   . Alzheimer's disease Mother   . Dementia Father   . Alzheimer's disease Father   . Stroke Maternal Grandmother   . Heart attack Maternal Grandfather   . CAD Maternal Grandfather   . Stroke Paternal Grandmother   . Colon cancer Neg Hx   . Colon polyps Neg Hx     Social History   Tobacco Use  . Smoking status: Current Some Day Smoker    Years: 30.00    Types: Cigars  . Smokeless tobacco: Never Used  . Tobacco comment: 07/14/2014 smoked cigs on and off x 40 yrs- none since 2014, but does smoke occ cigar-  Vaping Use  . Vaping Use: Never used  Substance Use Topics  . Alcohol use: Yes    Alcohol/week: 0.0 standard drinks    Comment: occ  . Drug use: No    Home Medications Prior to Admission medications   Medication Sig Start Date End Date Taking? Authorizing Provider  acetaminophen (TYLENOL) 325 MG tablet Take 650 mg by mouth every 6 (six) hours as needed for mild pain or moderate pain.    [provider]  aspirin EC 325 MG EC tablet Take 1 tablet (325 mg total) by mouth daily with breakfast. 05/13/20   Shepperson, Kirstin, PA-C  cyclobenzaprine (FLEXERIL) 10 MG tablet Take 1 tablet (10 mg total) by mouth 3 (three) times daily as needed for muscle spasms. 02/07/20   Newman Pies, MD  docusate sodium (COLACE) 100 MG capsule Take 1 capsule (100 mg total) by mouth 2 (two) times daily. Patient taking differently: Take 100 mg by mouth daily as needed for mild constipation or moderate constipation.  02/07/20   Newman Pies, MD  gabapentin (NEURONTIN) 300 MG capsule Take 300 mg by mouth daily as needed (pain).  05/24/19   [provider]  gabapentin (NEURONTIN) 600 MG tablet Take 600 mg by mouth at bedtime.    [provider]  hydrOXYzine (ATARAX/VISTARIL) 25 MG tablet Take 1 tablet (25 mg total) by mouth at bedtime. 06/10/20    Pisciotta, Elmyra Ricks, PA-C  Hyoscyamine Sulfate SL (LEVSIN/SL) 0.125 MG SUBL 0.125 to 0.250 SL q4hrs PRN Do not exceed 1.5 mg/day. 06/10/20   Pisciotta, Elmyra Ricks, PA-C  nitroGLYCERIN (NITROSTAT) 0.4 MG SL tablet Place 1 tablet (0.4 mg total) under the tongue every 5 (five) minutes as needed for chest pain. 07/11/19   Nahser, Wonda Cheng, MD  omeprazole (PRILOSEC) 20 MG capsule Take 20 mg by mouth daily before breakfast.    [provider]  ondansetron (ZOFRAN) 4 MG tablet Take 1 tablet (4 mg total) by mouth every 8 (eight) hours as needed for nausea or vomiting. 06/10/20   Pisciotta, Elmyra Ricks, PA-C  oxyCODONE (OXY IR/ROXICODONE) 5 MG immediate release tablet  1 po q 4 hrs prn pain 05/12/20   Shepperson, Kirstin, PA-C  polyethylene glycol (MIRALAX / GLYCOLAX) 17 g packet 17grams in 8 oz of something to drink twice a day until bowel movement.  LAXITIVE.  Restart if two days since last bowel movement 05/12/20   Shepperson, Kirstin, PA-C  ranitidine (ZANTAC) 75 MG tablet Take 2 tablets (150 mg total) by mouth 2 (two) times daily. 06/10/20   Pisciotta, Elmyra Ricks, PA-C  rosuvastatin (CRESTOR) 10 MG tablet Take 10 mg by mouth daily.    [provider]  sildenafil (VIAGRA) 100 MG tablet Take 100 mg by mouth daily as needed for erectile dysfunction.  04/21/20   [provider]  tadalafil (CIALIS) 5 MG tablet Take 5 mg by mouth daily as needed for erectile dysfunction.  06/24/19   [provider]  trolamine salicylate (BLUE-EMU HEMP) 10 % cream Apply 1 application topically daily as needed for muscle pain.    [provider]    Allergies    Morphine and related and Sulfa antibiotics  Review of Systems   Review of Systems  Constitutional: Negative for chills and fever.  HENT: Positive for congestion. Negative for ear pain.   Eyes: Negative for pain and visual disturbance.  Respiratory: Positive for shortness of breath. Negative for cough.   Cardiovascular: Negative for chest  pain and palpitations.  Gastrointestinal: Positive for abdominal pain and nausea. Negative for blood in stool, constipation and diarrhea.  Genitourinary: Positive for difficulty urinating and dysuria. Negative for hematuria.  Musculoskeletal: Negative for arthralgias and myalgias.  Skin: Negative for color change and rash.  Neurological: Negative for syncope and headaches.  All other systems reviewed and are negative.   Physical Exam Updated Vital Signs BP 134/68   Pulse (!) 107   Temp 98.6 F (37 C)   Resp 15   Ht 5\' 11"  (1.803 m)   Wt 89.4 kg   SpO2 91%   BMI 27.48 kg/m   Physical Exam Constitutional:      General: He is not in acute distress. HENT:     Head: Normocephalic and atraumatic.  Eyes:     Conjunctiva/sclera: Conjunctivae normal.     Pupils: Pupils are equal, round, and reactive to light.  Cardiovascular:     Rate and Rhythm: Normal rate and regular rhythm.     Pulses: Normal pulses.  Pulmonary:     Effort: Pulmonary effort is normal. No respiratory distress.  Abdominal:     General: There is no distension.     Palpations: There is no mass.     Tenderness: There is no abdominal tenderness. There is no guarding.  Skin:    General: Skin is warm and dry.  Neurological:     General: No focal deficit present.     Mental Status: He is alert. Mental status is at baseline.  Psychiatric:        Mood and Affect: Mood normal.        Behavior: Behavior normal.     ED Results / Procedures / Treatments   Labs (all labs ordered are listed, but only abnormal results are displayed) Labs Reviewed  COMPREHENSIVE METABOLIC PANEL - Abnormal; Notable for the following components:      Result Value   Sodium 134 (*)    Creatinine, Ser 1.42 (*)    GFR, Estimated 53 (*)    All other components within normal limits  CBC WITH DIFFERENTIAL/PLATELET - Abnormal; Notable for the following components:   Lymphs  Abs 0.6 (*)    All other components within normal limits   URINALYSIS, ROUTINE W REFLEX MICROSCOPIC - Abnormal; Notable for the following components:   Color, Urine STRAW (*)    All other components within normal limits  LIPASE, BLOOD  TROPONIN I (HIGH SENSITIVITY)    EKG EKG Interpretation  Date/Time:  Friday July 17 2020 07:49:29 EST Ventricular Rate:  72 PR Interval:    QRS Duration: 132 QT Interval:  404 QTC Calculation: 443 R Axis:   -86 Text 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 Both lungs are clear. The visualized skeletal structures are unremarkable. ACDF changes partially visualized in the lower cervical spine. IMPRESSION: No acute cardiopulmonary process. Electronically Signed   By: Miachel Roux M.D.   On: 07/17/2020 08:12   CT Renal Stone Study  Result Date: 07/17/2020 CLINICAL DATA:  Flank pain with kidney stone suspected. Mid abdominal pain for several months EXAM: CT ABDOMEN AND PELVIS WITHOUT CONTRAST TECHNIQUE: Multidetector CT imaging of the abdomen and pelvis was performed following the standard protocol without IV contrast. COMPARISON:  06/10/2020 FINDINGS: Lower chest:  No contributory findings. Hepatobiliary: 4.1 cm right hepatic cyst.No evidence of biliary obstruction or stone. Pancreas: Unremarkable. Spleen: Unremarkable. Adrenals/Urinary Tract: Negative adrenals. No hydronephrosis or ureteral stone. Renal sinus cysts as confirmed on prior enhanced CT. Four right renal calculi measuring up to 3 mm. Two punctate left upper pole renal calculi. 37 mm left lower pole cyst. Unremarkable bladder. Stomach/Bowel: No obstruction. No visible bowel inflammation. Left colonic diverticulosis. Vascular/Lymphatic: No acute  vascular abnormality. Scattered atheromatous calcifications. No mass or adenopathy. Reproductive:Partially covered presumed left hydrocele. Other: No ascites or pneumoperitoneum. Musculoskeletal: No acute abnormalities. L2-L5 PLIF with solid arthrodesis. L5-S1 facet osteoarthritis with anterolisthesis and ankylosis. Advanced disc degeneration above the fusion. IMPRESSION: 1. No acute finding. 2. Bilateral nonobstructing renal calculi. Electronically Signed   By: Monte Fantasia M.D.   On: 07/17/2020 09:10    Procedures Procedures   Medications Ordered in ED Medications  famotidine (PEPCID) IVPB 20 mg premix (0 mg Intravenous Stopped 07/17/20 0813)  alum & mag hydroxide-simeth (MAALOX/MYLANTA) 200-200-20 MG/5ML suspension 30 mL (30 mLs Oral Given 07/17/20 0813)    ED Course  I have reviewed the triage vital signs and the nursing notes.  Pertinent labs & imaging results that were available during my care of the patient were reviewed by me and considered in my medical decision making (see chart for details).  This patient presents to the Emergency Department with complaint of abdominal pain. This involves an extensive number of treatment options, and is a complaint that carries with it a high risk of complications and morbidity.  The differential diagnosis includes gastritis vs gastric ulcer vs duodenal ulcer vs biliary disease vs colitis/diverticulitis vs kidney stone/renal colic vs [id number] vs other  This may be an exacerbation of his chronic symptoms due to current covid infection.  His lower abdominal pain is not consistent with gastritis alone.  I wonder about renal colic with these known kidney stones on prior CT scan, particularly with his urinary frequency.  We'll check a UA and consider CT abdomen renal study.  I ordered, reviewed, and interpreted labs, including BMP and CBC.  There  were no immediate, life-threatening emergencies found in this labwork.  Patient's UA showed  no signs of  infection.  Trop 5 - unlikely atypical ACS with this workup I ordered medication GI cocktail and pepcid for abdominal pain and/or nausea, with some improvement on reassessment I ordered imaging studies which included CT renal study, DG chest I independently visualized and interpreted imaging which showed no acute findings, multiple renal stones, and the monitor tracing which showed regular rate Previous records obtained and reviewed showing prior CT scans I personally reviewed the patients ECG  which showed sinus rhythm with no acute ischemic findings, no sig changes from prior, continued bifascicular block pattern  After the interventions stated above, I reevaluated the patient and found that they remained clinically stable.  Based on the patient's clinical exam, vital signs, risk factors, and ED testing, I felt that the patient's overall risk of life-threatening emergency such as bowel perforation, surgical emergency, or sepsis was quite low.  I suspect this clinical presentation is most consistent with chronic gastritis, or upper GI ulceration, but explained to the patient that this evaluation was not a definitive diagnostic workup.  I discussed outpatient follow up with primary care provider, and provided specialist office number on the patient's discharge paper if a referral was deemed necessary.  I discussed return precautions with the patient. I felt the patient was clinically stable for discharge.   Clinical Course as of 07/17/20 1817  Ludwig Clarks Jul 17, 2020  0851 CT renal study ordered.  Initial trop 5 with > 2 months of symptoms, I doubt this is ACS.  UA pending. [MT]  [phone] GI medications improved his pain somewhat.  I spoke to Dr Michail Sermon from [place] regarding the delay in EGD rescheduling, and he will contact their office about possibly scheduling earlier.  Patient informed and content with plan - he'll follow up with their office.  I would not add additional medications as he is already on  4 GI medications.  I discussed dietary changes for possible peptic ulcer.  He verbalized understanding.  We can also try flomax for his issue with bladder emptying - it does not appear to be an infection.  Possible BPH  [MT]    Clinical Course User Index [MT] Dorothie Wah, Carola Rhine, MD     Final Clinical Impression(s) / ED Diagnoses Final diagnoses:  Gastritis, presence of bleeding unspecified, unspecified chronicity, unspecified gastritis type  Epigastric pain    Rx / DC Orders ED Discharge Orders    None       Wyvonnia Dusky, MD 07/17/20 [phone]

## 2020-07-17 NOTE — ED Notes (Signed)
Patient transported to CT 

## 2020-07-18 ENCOUNTER — Telehealth (HOSPITAL_COMMUNITY): Payer: Self-pay | Admitting: Family

## 2020-07-18 NOTE — Telephone Encounter (Signed)
Called to discuss with patient about COVID-19 symptoms and the use of one of the available treatments for those with mild to moderate Covid symptoms and at a high risk of hospitalization.  Pt appears to qualify for outpatient treatment due to co-morbid conditions and/or a member of an at-risk group in accordance with the FDA Emergency Use Authorization.     Unable to reach pt.   [employer]

## 2020-07-28 ENCOUNTER — Other Ambulatory Visit: Payer: Medicare Other

## 2020-07-28 DIAGNOSIS — Z20822 Contact with and (suspected) exposure to covid-19: Secondary | ICD-10-CM | POA: Diagnosis not present

## 2020-07-29 LAB — NOVEL CORONAVIRUS, NAA: SARS-CoV-2, NAA: NOT DETECTED

## 2020-07-29 LAB — SARS-COV-2, NAA 2 DAY TAT

## 2020-08-17 ENCOUNTER — Other Ambulatory Visit: Payer: Self-pay | Admitting: Physician Assistant

## 2020-08-17 DIAGNOSIS — K219 Gastro-esophageal reflux disease without esophagitis: Secondary | ICD-10-CM | POA: Diagnosis not present

## 2020-08-17 DIAGNOSIS — D500 Iron deficiency anemia secondary to blood loss (chronic): Secondary | ICD-10-CM | POA: Diagnosis not present

## 2020-08-17 DIAGNOSIS — R1084 Generalized abdominal pain: Secondary | ICD-10-CM | POA: Diagnosis not present

## 2020-08-17 DIAGNOSIS — K5900 Constipation, unspecified: Secondary | ICD-10-CM | POA: Diagnosis not present

## 2020-08-17 DIAGNOSIS — K2100 Gastro-esophageal reflux disease with esophagitis, without bleeding: Secondary | ICD-10-CM

## 2020-08-27 ENCOUNTER — Ambulatory Visit
Admission: RE | Admit: 2020-08-27 | Discharge: 2020-08-27 | Disposition: A | Discharge status: Home / Self Care | Payer: Medicare Other | Source: Ambulatory Visit | Attending: Physician Assistant | Admitting: Physician Assistant

## 2020-08-27 DIAGNOSIS — K7689 Other specified diseases of liver: Secondary | ICD-10-CM | POA: Diagnosis not present

## 2020-08-27 DIAGNOSIS — K219 Gastro-esophageal reflux disease without esophagitis: Secondary | ICD-10-CM | POA: Diagnosis not present

## 2020-08-27 DIAGNOSIS — K2100 Gastro-esophageal reflux disease with esophagitis, without bleeding: Secondary | ICD-10-CM

## 2020-09-09 DIAGNOSIS — E7849 Other hyperlipidemia: Secondary | ICD-10-CM | POA: Diagnosis not present

## 2020-09-09 DIAGNOSIS — K219 Gastro-esophageal reflux disease without esophagitis: Secondary | ICD-10-CM | POA: Diagnosis not present

## 2020-09-21 DIAGNOSIS — H6123 Impacted cerumen, bilateral: Secondary | ICD-10-CM | POA: Diagnosis not present

## 2020-09-21 DIAGNOSIS — H838X3 Other specified diseases of inner ear, bilateral: Secondary | ICD-10-CM | POA: Diagnosis not present

## 2020-09-21 DIAGNOSIS — H6981 Other specified disorders of Eustachian tube, right ear: Secondary | ICD-10-CM | POA: Diagnosis not present

## 2020-09-21 DIAGNOSIS — H903 Sensorineural hearing loss, bilateral: Secondary | ICD-10-CM | POA: Diagnosis not present

## 2020-10-06 DIAGNOSIS — M1711 Unilateral primary osteoarthritis, right knee: Secondary | ICD-10-CM | POA: Diagnosis not present

## 2020-10-10 DIAGNOSIS — E7849 Other hyperlipidemia: Secondary | ICD-10-CM | POA: Diagnosis not present

## 2020-10-10 DIAGNOSIS — K219 Gastro-esophageal reflux disease without esophagitis: Secondary | ICD-10-CM | POA: Diagnosis not present

## 2020-10-22 DIAGNOSIS — E6609 Other obesity due to excess calories: Secondary | ICD-10-CM | POA: Diagnosis not present

## 2020-10-22 DIAGNOSIS — Z6832 Body mass index (BMI) 32.0-32.9, adult: Secondary | ICD-10-CM | POA: Diagnosis not present

## 2020-10-22 DIAGNOSIS — B070 Plantar wart: Secondary | ICD-10-CM | POA: Diagnosis not present

## 2020-10-26 DIAGNOSIS — K5900 Constipation, unspecified: Secondary | ICD-10-CM | POA: Diagnosis not present

## 2020-10-26 DIAGNOSIS — K219 Gastro-esophageal reflux disease without esophagitis: Secondary | ICD-10-CM | POA: Diagnosis not present

## 2020-10-26 DIAGNOSIS — F419 Anxiety disorder, unspecified: Secondary | ICD-10-CM | POA: Diagnosis not present

## 2020-10-26 DIAGNOSIS — R109 Unspecified abdominal pain: Secondary | ICD-10-CM | POA: Diagnosis not present

## 2020-10-29 DIAGNOSIS — E6609 Other obesity due to excess calories: Secondary | ICD-10-CM | POA: Diagnosis not present

## 2020-10-29 DIAGNOSIS — E7849 Other hyperlipidemia: Secondary | ICD-10-CM | POA: Diagnosis not present

## 2020-10-29 DIAGNOSIS — Z23 Encounter for immunization: Secondary | ICD-10-CM | POA: Diagnosis not present

## 2020-10-29 DIAGNOSIS — Z6832 Body mass index (BMI) 32.0-32.9, adult: Secondary | ICD-10-CM | POA: Diagnosis not present

## 2020-11-04 DIAGNOSIS — L821 Other seborrheic keratosis: Secondary | ICD-10-CM | POA: Diagnosis not present

## 2020-11-04 DIAGNOSIS — X32XXXD Exposure to sunlight, subsequent encounter: Secondary | ICD-10-CM | POA: Diagnosis not present

## 2020-11-04 DIAGNOSIS — L570 Actinic keratosis: Secondary | ICD-10-CM | POA: Diagnosis not present

## 2020-11-04 DIAGNOSIS — D225 Melanocytic nevi of trunk: Secondary | ICD-10-CM | POA: Diagnosis not present

## 2020-11-04 DIAGNOSIS — D485 Neoplasm of uncertain behavior of skin: Secondary | ICD-10-CM | POA: Diagnosis not present

## 2020-11-04 DIAGNOSIS — B078 Other viral warts: Secondary | ICD-10-CM | POA: Diagnosis not present

## 2020-11-10 DIAGNOSIS — E7849 Other hyperlipidemia: Secondary | ICD-10-CM | POA: Diagnosis not present

## 2020-11-10 DIAGNOSIS — Z981 Arthrodesis status: Secondary | ICD-10-CM | POA: Diagnosis not present

## 2020-11-10 DIAGNOSIS — K219 Gastro-esophageal reflux disease without esophagitis: Secondary | ICD-10-CM | POA: Diagnosis not present

## 2020-11-10 DIAGNOSIS — M5136 Other intervertebral disc degeneration, lumbar region: Secondary | ICD-10-CM | POA: Diagnosis not present

## 2020-11-19 DIAGNOSIS — R1033 Periumbilical pain: Secondary | ICD-10-CM | POA: Diagnosis not present

## 2020-11-19 DIAGNOSIS — R1013 Epigastric pain: Secondary | ICD-10-CM | POA: Diagnosis not present

## 2020-11-20 ENCOUNTER — Other Ambulatory Visit (HOSPITAL_COMMUNITY): Payer: Self-pay | Admitting: Gastroenterology

## 2020-11-20 ENCOUNTER — Other Ambulatory Visit: Payer: Self-pay | Admitting: Gastroenterology

## 2020-11-20 DIAGNOSIS — R112 Nausea with vomiting, unspecified: Secondary | ICD-10-CM

## 2020-11-20 DIAGNOSIS — R109 Unspecified abdominal pain: Secondary | ICD-10-CM

## 2020-12-07 ENCOUNTER — Other Ambulatory Visit: Payer: Self-pay

## 2020-12-07 ENCOUNTER — Ambulatory Visit (HOSPITAL_COMMUNITY)
Admission: RE | Admit: 2020-12-07 | Discharge: 2020-12-07 | Disposition: A | Discharge status: Home / Self Care | Payer: Medicare Other | Source: Ambulatory Visit | Attending: Gastroenterology | Admitting: Gastroenterology

## 2020-12-07 DIAGNOSIS — R112 Nausea with vomiting, unspecified: Secondary | ICD-10-CM | POA: Insufficient documentation

## 2020-12-07 DIAGNOSIS — R109 Unspecified abdominal pain: Secondary | ICD-10-CM

## 2020-12-07 MED ORDER — TECHNETIUM TC 99M SULFUR COLLOID
2.1000 | Freq: Once | INTRAVENOUS | Status: AC | PRN
  Administered 2020-12-07: 08:00:00 2.1 via INTRAVENOUS

## 2020-12-21 DIAGNOSIS — L110 Acquired keratosis follicularis: Secondary | ICD-10-CM | POA: Diagnosis not present

## 2020-12-21 DIAGNOSIS — M79672 Pain in left foot: Secondary | ICD-10-CM | POA: Diagnosis not present

## 2020-12-21 DIAGNOSIS — M779 Enthesopathy, unspecified: Secondary | ICD-10-CM | POA: Diagnosis not present

## 2020-12-21 DIAGNOSIS — M7662 Achilles tendinitis, left leg: Secondary | ICD-10-CM | POA: Diagnosis not present

## 2020-12-22 DIAGNOSIS — K5900 Constipation, unspecified: Secondary | ICD-10-CM | POA: Diagnosis not present

## 2020-12-22 DIAGNOSIS — K219 Gastro-esophageal reflux disease without esophagitis: Secondary | ICD-10-CM | POA: Diagnosis not present

## 2020-12-22 DIAGNOSIS — F419 Anxiety disorder, unspecified: Secondary | ICD-10-CM | POA: Diagnosis not present

## 2020-12-22 DIAGNOSIS — R1084 Generalized abdominal pain: Secondary | ICD-10-CM | POA: Diagnosis not present

## 2021-01-25 DIAGNOSIS — F419 Anxiety disorder, unspecified: Secondary | ICD-10-CM | POA: Diagnosis not present

## 2021-01-25 DIAGNOSIS — E6609 Other obesity due to excess calories: Secondary | ICD-10-CM | POA: Diagnosis not present

## 2021-01-25 DIAGNOSIS — G252 Other specified forms of tremor: Secondary | ICD-10-CM | POA: Diagnosis not present

## 2021-01-25 DIAGNOSIS — Z6831 Body mass index (BMI) 31.0-31.9, adult: Secondary | ICD-10-CM | POA: Diagnosis not present

## 2021-01-28 DIAGNOSIS — M79672 Pain in left foot: Secondary | ICD-10-CM | POA: Diagnosis not present

## 2021-01-28 DIAGNOSIS — M779 Enthesopathy, unspecified: Secondary | ICD-10-CM | POA: Diagnosis not present

## 2021-01-28 DIAGNOSIS — S93332D Other subluxation of left foot, subsequent encounter: Secondary | ICD-10-CM | POA: Diagnosis not present

## 2021-01-28 DIAGNOSIS — M79675 Pain in left toe(s): Secondary | ICD-10-CM | POA: Diagnosis not present

## 2021-01-28 DIAGNOSIS — L110 Acquired keratosis follicularis: Secondary | ICD-10-CM | POA: Diagnosis not present

## 2021-03-08 DIAGNOSIS — U071 COVID-19: Secondary | ICD-10-CM | POA: Diagnosis not present

## 2021-03-09 DIAGNOSIS — M25552 Pain in left hip: Secondary | ICD-10-CM | POA: Diagnosis not present

## 2021-03-09 DIAGNOSIS — Z981 Arthrodesis status: Secondary | ICD-10-CM | POA: Diagnosis not present

## 2021-03-12 DIAGNOSIS — Z20822 Contact with and (suspected) exposure to covid-19: Secondary | ICD-10-CM | POA: Diagnosis not present

## 2021-03-13 DIAGNOSIS — Z20822 Contact with and (suspected) exposure to covid-19: Secondary | ICD-10-CM | POA: Diagnosis not present

## 2021-03-16 ENCOUNTER — Other Ambulatory Visit (HOSPITAL_COMMUNITY): Payer: Self-pay | Admitting: Physician Assistant

## 2021-03-16 ENCOUNTER — Other Ambulatory Visit: Payer: Self-pay | Admitting: Physician Assistant

## 2021-03-16 DIAGNOSIS — K219 Gastro-esophageal reflux disease without esophagitis: Secondary | ICD-10-CM

## 2021-03-16 DIAGNOSIS — K5900 Constipation, unspecified: Secondary | ICD-10-CM | POA: Diagnosis not present

## 2021-03-16 DIAGNOSIS — R1084 Generalized abdominal pain: Secondary | ICD-10-CM | POA: Diagnosis not present

## 2021-03-16 DIAGNOSIS — F419 Anxiety disorder, unspecified: Secondary | ICD-10-CM | POA: Diagnosis not present

## 2021-03-22 ENCOUNTER — Ambulatory Visit (HOSPITAL_COMMUNITY)
Admission: RE | Admit: 2021-03-22 | Discharge: 2021-03-22 | Disposition: A | Discharge status: Home / Self Care | Payer: Medicare Other | Source: Ambulatory Visit | Attending: Physician Assistant | Admitting: Physician Assistant

## 2021-03-22 ENCOUNTER — Other Ambulatory Visit: Payer: Self-pay

## 2021-03-22 DIAGNOSIS — K219 Gastro-esophageal reflux disease without esophagitis: Secondary | ICD-10-CM | POA: Diagnosis not present

## 2021-03-22 DIAGNOSIS — R1084 Generalized abdominal pain: Secondary | ICD-10-CM | POA: Diagnosis not present

## 2021-03-22 MED ORDER — TECHNETIUM TC 99M MEBROFENIN IV KIT
5.3000 | PACK | Freq: Once | INTRAVENOUS | Status: AC | PRN
  Administered 2021-03-22: 5.3 via INTRAVENOUS

## 2021-04-07 DIAGNOSIS — Z6831 Body mass index (BMI) 31.0-31.9, adult: Secondary | ICD-10-CM | POA: Diagnosis not present

## 2021-04-07 DIAGNOSIS — M549 Dorsalgia, unspecified: Secondary | ICD-10-CM | POA: Diagnosis not present

## 2021-04-07 DIAGNOSIS — M5136 Other intervertebral disc degeneration, lumbar region: Secondary | ICD-10-CM | POA: Diagnosis not present

## 2021-04-07 DIAGNOSIS — E6609 Other obesity due to excess calories: Secondary | ICD-10-CM | POA: Diagnosis not present

## 2021-04-07 DIAGNOSIS — Z23 Encounter for immunization: Secondary | ICD-10-CM | POA: Diagnosis not present

## 2021-04-13 DIAGNOSIS — Z20822 Contact with and (suspected) exposure to covid-19: Secondary | ICD-10-CM | POA: Diagnosis not present

## 2021-04-15 DIAGNOSIS — H903 Sensorineural hearing loss, bilateral: Secondary | ICD-10-CM | POA: Diagnosis not present

## 2021-04-15 DIAGNOSIS — R040 Epistaxis: Secondary | ICD-10-CM | POA: Diagnosis not present

## 2021-04-15 DIAGNOSIS — H6123 Impacted cerumen, bilateral: Secondary | ICD-10-CM | POA: Diagnosis not present

## 2021-04-27 ENCOUNTER — Other Ambulatory Visit: Payer: Self-pay

## 2021-04-27 ENCOUNTER — Ambulatory Visit (INDEPENDENT_AMBULATORY_CARE_PROVIDER_SITE_OTHER): Payer: Medicare Other | Admitting: Neurology

## 2021-04-27 ENCOUNTER — Encounter: Payer: Self-pay | Admitting: Neurology

## 2021-04-27 VITALS — BP 152/84 | HR 65 | Ht 70.0 in | Wt 217.0 lb

## 2021-04-27 DIAGNOSIS — R269 Unspecified abnormalities of gait and mobility: Secondary | ICD-10-CM

## 2021-04-27 DIAGNOSIS — R251 Tremor, unspecified: Secondary | ICD-10-CM | POA: Diagnosis not present

## 2021-04-27 NOTE — Progress Notes (Signed)
Chief Complaint  Patient presents with   New Patient (Initial Visit)    Rm 15, alone,NP/Paper/John Hilma Favors MD [facility] [phone]/tremor and spasms        ASSESSMENT AND PLAN  Patrick Mathews is a 70 y.o. male   Gait abnormality  Due to residual left ankle dorsiflexion weakness from previous lumbar decompression surgery, lumbar radiculopathy,  Intermittent bilateral hands tremor,  No rigidity, no bradykinesia, no weakness,   Mild action tremor  Check TSH   DIAGNOSTIC DATA (LABS, IMAGING, TESTING) - I reviewed patient records, labs, notes, testing and imaging myself where available.   MEDICAL HISTORY:  Patrick Mathews is a 70 year old male, seen in request by his primary care physician Dr. Hilma Favors, Jenny Reichmann, for evaluation of bilateral hands tremor, initial evaluation was on April 27, 2021.  I reviewed and summarized the referring note.  Past medical history Depression, anxiety Hyperlipidemia Coronary artery disease Hypertension Lumbar decompression surgery, ACDF by Dr.  Joya Salm  in 2009 Knee replacement  He started to experience depression anxiety since 2022, after going through knee replacement, low back surgery, this is the 4th time he has gone through lumbar decompression surgery, he started treatment of BuSpar 10 mg 3 times a day, which has helped his symptoms, for chronic low back pain, lower extremity paresthesia, occasional lower extremity spasm, He has been taking gabapentin 600 mg every night, which has been helpful  He also suffered COVID in February 2022, has shortness of breath, with all this, he began to notice intermittent bilateral hands tremor, noticed tremor when he writes, holding small piece of paper, there is no limitation in his daily function, his handwriting is well written.  He has mild gait abnormality from residual left ankle weakness from previous lumbar decompression surgery denied loss sense of smell, does has vivid  dreams sometimes, reported fell off bed twice over the past few months from dreams.  PHYSICAL EXAM:   Vitals:   04/27/21 0924  BP: (!) 152/84  Pulse: 65  Weight: 217 lb (98.4 kg)  Height: 5\' 10"  (1.778 m)   Not recorded     Body mass index is 31.14 kg/m.  PHYSICAL EXAMNIATION:  Gen: NAD, conversant, well nourised, well groomed                     Cardiovascular: Regular rate rhythm, no peripheral edema, warm, nontender. Eyes: Conjunctivae clear without exudates or hemorrhage Neck: Supple, no carotid bruits. Pulmonary: Clear to auscultation bilaterally   NEUROLOGICAL EXAM:  MENTAL STATUS: Speech:    Speech is normal; fluent and spontaneous with normal comprehension.  Cognition:     Orientation to time, place and person     Normal recent and remote memory     Normal Attention span and concentration     Normal Language, naming, repeating,spontaneous speech     Fund of knowledge   CRANIAL NERVES: CN II: Visual fields are full to confrontation. Pupils are round equal and briskly reactive to light. CN III, IV, VI: extraocular movement are normal. No ptosis. CN V: Facial sensation is intact to light touch CN VII: Face is symmetric with normal eye closure  CN VIII: Hearing is normal to causal conversation. CN IX, X: Phonation is normal. CN XI: Head turning and shoulder shrug are intact  MOTOR: He has mild left ankle dorsiflexion weakness, no significant muscle weakness otherwise. No significant tremor, bradykinesia, rigidity.  REFLEXES: Reflexes are 2+ and symmetric at the biceps, triceps, knees, and  absent at ankles. Plantar responses are flexor.  SENSORY: Intact to light touch, pinprick and vibratory sensation are intact in fingers and toes.  COORDINATION: There is no trunk or limb dysmetria noted.  GAIT/STANCE: He can get up from seated position arm crossed, dragging left leg mildly,  REVIEW OF SYSTEMS:  Full 14 system review of systems performed and notable  only for as above All other review of systems were negative.   ALLERGIES: Allergies  Allergen Reactions   Morphine And Related Nausea And Vomiting   Sulfa Antibiotics Hives    HOME MEDICATIONS: Current Outpatient Medications  Medication Sig Dispense Refill   acetaminophen (TYLENOL) 325 MG tablet Take 650 mg by mouth every 6 (six) hours as needed for mild pain or moderate pain.     aspirin EC 325 MG EC tablet Take 1 tablet (325 mg total) by mouth daily with breakfast. 30 tablet 0   busPIRone (BUSPAR) 10 MG tablet Take 10 mg by mouth 3 (three) times daily as needed.     cyclobenzaprine (FLEXERIL) 10 MG tablet Take 1 tablet (10 mg total) by mouth 3 (three) times daily as needed for muscle spasms. 30 tablet 0   dicyclomine (BENTYL) 20 MG tablet      docusate sodium (COLACE) 100 MG capsule Take 1 capsule (100 mg total) by mouth 2 (two) times daily. 60 capsule 0   gabapentin (NEURONTIN) 600 MG tablet Take 600 mg by mouth at bedtime.     Hyoscyamine Sulfate SL (LEVSIN/SL) 0.125 MG SUBL 0.125 to 0.250 SL q4hrs PRN Do not exceed 1.5 mg/day. 20 tablet 0   ondansetron (ZOFRAN) 4 MG tablet Take 1 tablet (4 mg total) by mouth every 8 (eight) hours as needed for nausea or vomiting. 10 tablet 0   rosuvastatin (CRESTOR) 10 MG tablet Take 10 mg by mouth daily.     sildenafil (VIAGRA) 100 MG tablet Take 100 mg by mouth daily as needed for erectile dysfunction.      No current facility-administered medications for this visit.    PAST MEDICAL HISTORY: Past Medical History:  Diagnosis Date   Adjacent segment disease with spinal stenosis    Arthritis    "knees; back" (07/14/2014)   Asthma    CAD (coronary artery disease)    Chest pain    Exertional dyspnea    GERD (gastroesophageal reflux disease)    Hyperlipidemia    Hypertension    Pneumonia 11/2013   Primary localized osteoarthritis of left knee    Primary localized osteoarthritis of right knee 05/04/2020   Seasonal allergies     PAST  SURGICAL HISTORY: Past Surgical History:  Procedure Laterality Date   ANTERIOR CERVICAL DECOMP/DISCECTOMY FUSION  2009   Dr Evlyn Kanner SURGERY     BUNIONECTOMY Right  2013   COLONOSCOPY N/A 11/06/2012   Procedure: COLONOSCOPY;  Surgeon: Jamesetta So, MD;  [facility];  Service: Gastroenterology;  Laterality: N/A;   FOOT SURGERY     JOINT REPLACEMENT     KNEE ARTHROSCOPY Bilateral 41's   POSTERIOR LUMBAR FUSION  1985; 2009   Dr Joya Salm   TONSILLECTOMY  1950's   TOTAL KNEE ARTHROPLASTY Left 07/14/2014   TOTAL KNEE ARTHROPLASTY Left 07/14/2014   Procedure: LEFT TOTAL KNEE ARTHROPLASTY;  Surgeon: [name], MD;  Location: [facility];  Service: Orthopedics;  Laterality: Left;   TOTAL KNEE ARTHROPLASTY Right 05/11/2020   Procedure: TOTAL KNEE ARTHROPLASTY;  Surgeon: Elsie Saas, MD;  Location: [facility];  Service:  Orthopedics;  Laterality: Right;   WISDOM TOOTH EXTRACTION      FAMILY HISTORY: Family History  Problem Relation Age of Onset   CAD Mother    CVA Mother    Alzheimer's disease Mother    Dementia Father    Alzheimer's disease Father    Stroke Maternal Grandmother    Heart attack Maternal Grandfather    CAD Maternal Grandfather    Stroke Paternal Grandmother    Colon cancer Neg Hx    Colon polyps Neg Hx     SOCIAL HISTORY: Social History   Socioeconomic History   Marital status: Married    Spouse name: Not on file   Number of children: Not on file   Years of education: Not on file   Highest education level: Not on file  Occupational History   Not on file  Tobacco Use   Smoking status: Some Days    Types: Cigars   Smokeless tobacco: Never   Tobacco comments:    07/14/2014 smoked cigs on and off x 40 yrs- none since 2014, but does smoke occ cigar-  Vaping Use   Vaping Use: Never used  Substance and Sexual Activity   Alcohol use: Yes    Alcohol/week: 0.0 standard drinks    Comment: occ   Drug use: No   Sexual activity: Yes  Other Topics  Concern   Not on file  Social History Narrative   Not on file   Social Determinants of Health   Financial Resource Strain: Not on file  Food Insecurity: Not on file  Transportation Needs: Not on file  Physical Activity: Not on file  Stress: Not on file  Social Connections: Not on file  Intimate Partner Violence: Not on file      Marcial Pacas, M.D. Ph.D.  [facility] [address], [place], [place] [address] Ph: [phone] Fax: [phone]  CC:  Sharilyn Sites, MD [address] [place],  [place] [address]  Sharilyn Sites, MD

## 2021-04-28 ENCOUNTER — Telehealth: Payer: Self-pay | Admitting: *Deleted

## 2021-04-28 LAB — TSH: TSH: 1.24 u[IU]/mL (ref 0.450–4.500)

## 2021-04-28 NOTE — Telephone Encounter (Signed)
I spoke to the patient. He is aware of the lab results.

## 2021-04-28 NOTE — Telephone Encounter (Signed)
-----   Message from Marcial Pacas, MD sent at 04/28/2021  2:45 PM EST ----- Please call patient for normal TSH

## 2021-05-04 ENCOUNTER — Telehealth: Payer: Self-pay | Admitting: Cardiovascular Disease

## 2021-05-04 DIAGNOSIS — R030 Elevated blood-pressure reading, without diagnosis of hypertension: Secondary | ICD-10-CM | POA: Diagnosis not present

## 2021-05-04 DIAGNOSIS — Z981 Arthrodesis status: Secondary | ICD-10-CM | POA: Diagnosis not present

## 2021-05-04 DIAGNOSIS — M549 Dorsalgia, unspecified: Secondary | ICD-10-CM | POA: Diagnosis not present

## 2021-05-04 DIAGNOSIS — Z6830 Body mass index (BMI) 30.0-30.9, adult: Secondary | ICD-10-CM | POA: Diagnosis not present

## 2021-05-04 NOTE — Telephone Encounter (Signed)
Patient called back and reports that he has not had and swelling or weight gain.  He states that chest tightness feels different than it has in the past when it was more associated with eating. Patient concerned mostly in regards to his shortness of breath. He states that he used to be able to walk 2 miles but now can only do about 1/2 mile because he gives out quicker. He states that he has had some stomach issues going on and recently started on Bentyl. Other than that no other changes.  Patient has been scheduled to see Laurann Montana, NP on 11/29. He is aware to contact us if symptoms worsen in the meantime as well as ER precautions for chest pain without relief. Advised I would make Dr. Acie Fredrickson aware and let him know if he has any further recommendations. Patient verbalized understanding.

## 2021-05-04 NOTE — Telephone Encounter (Signed)
Pt returning call

## 2021-05-04 NOTE — Telephone Encounter (Signed)
Spoke with the patient who reports that he has been having some shortness of breath and chest tightness over the past couple of months. He states that shortness of breath occurs when he is walking. It does not occur at rest. States that he has not been walking as much as he used to. He states that chest tightness comes and goes. It is relieved with rest. He denies any other symptoms. Patient reports that he is at his back doctors office at the moment and needs to go. He will call back.

## 2021-05-04 NOTE — Telephone Encounter (Signed)
Patient was here to make a f/u appointment with Dr. Acie Fredrickson - stated he has been having SOB and some chest pressure over the past couple of months.   Spoke with Anderson Malta B and she ask me to make the f/u appointment for 07/09/21 and send  a note regarding the about.

## 2021-05-10 NOTE — Progress Notes (Signed)
Cardiology Office Note:    Date:  05/11/2021   ID:  Patrick Mathews, DOB 10-24-1950, MRN [id number]  PCP:  Sharilyn Sites, MD   [facility] Providers Cardiologist:  Mertie Moores, MD     Referring MD: Sharilyn Sites, MD   Chief Complaint: activity intolerance and shortness of breath  History of Present Illness:    Patrick Mathews is a 69 y.o. male with a hx of hyperlipidemia, multiple pulmonary nodules, obesity, and kidney stones. He was initially referred to cardiology in January 2021 for evaluation of chest pain/pressure for the previous 6 months. He had a low risk stress myoview with no evidence of ischemia and normal LVEF on 07/22/19. At last office visit on 09/27/19, he reported resolution of chest discomfort which he contributed to pain after eating a meal. He came in to our office on 05/04/21 to request an appointment with Dr. Acie Fredrickson for chest tightness and dyspnea on exertion.   Today, he is here with his wife. He reports a 46-month progression of increasing activity intolerance including mild occasional mid-sternal chest pain that he describes as "numbness" and shortness of breath. Had Covid-19 infection in Feb 2022 but did not have severe respiratory symptoms. States he can walk approximately 100 yards before he has to stop and rest . One year ago he was walking 2 miles almost every day. Denies lower extremity edema, fatigue, palpitations, diaphoresis, weakness, presyncope, syncope, orthopnea, and PND. He reports lightheadedness when he bends forward while raking leaves or to pick up a golf ball. Admits that he does not drink enough water daily, drinks more tea and ginger ale. Has had "stomach issues" for the past 1 year and has undergone multiple tests with GI.  Past Medical History:  Diagnosis Date   Adjacent segment disease with spinal stenosis    Arthritis    "knees; back" (07/14/2014)   Asthma    CAD (coronary artery disease)    Chest pain    Exertional dyspnea    GERD  (gastroesophageal reflux disease)    Hyperlipidemia    Hypertension    Pneumonia 11/2013   Primary localized osteoarthritis of left knee    Primary localized osteoarthritis of right knee 05/04/2020   Seasonal allergies     Past Surgical History:  Procedure Laterality Date   ANTERIOR CERVICAL DECOMP/DISCECTOMY FUSION  2009   Dr Evlyn Kanner SURGERY     BUNIONECTOMY Right  2013   COLONOSCOPY N/A 11/06/2012   Procedure: COLONOSCOPY;  Surgeon: Jamesetta So, MD;  [facility];  Service: Gastroenterology;  Laterality: N/A;   FOOT SURGERY     JOINT REPLACEMENT     KNEE ARTHROSCOPY Bilateral 58's   POSTERIOR LUMBAR FUSION  1985; 2009   Dr Joya Salm   TONSILLECTOMY  1950's   TOTAL KNEE ARTHROPLASTY Left 07/14/2014   TOTAL KNEE ARTHROPLASTY Left 07/14/2014   Procedure: LEFT TOTAL KNEE ARTHROPLASTY;  Surgeon: [name], MD;  Location: [facility];  Service: Orthopedics;  Laterality: Left;   TOTAL KNEE ARTHROPLASTY Right 05/11/2020   Procedure: TOTAL KNEE ARTHROPLASTY;  Surgeon: Elsie Saas, MD;  [facility];  Service: Orthopedics;  Laterality: Right;   WISDOM TOOTH EXTRACTION      Current Medications: Current Meds  Medication Sig   acetaminophen (TYLENOL) 325 MG tablet Take 650 mg by mouth every 6 (six) hours as needed for mild pain or moderate pain.   busPIRone (BUSPAR) 10 MG tablet Take 10 mg by mouth 3 (three) times  daily as needed.   cyclobenzaprine (FLEXERIL) 10 MG tablet Take 1 tablet (10 mg total) by mouth 3 (three) times daily as needed for muscle spasms.   dicyclomine (BENTYL) 20 MG tablet    docusate sodium (COLACE) 100 MG capsule Take 1 capsule (100 mg total) by mouth 2 (two) times daily.   gabapentin (NEURONTIN) 600 MG tablet Take 600 mg by mouth at bedtime.   ondansetron (ZOFRAN) 4 MG tablet Take 1 tablet (4 mg total) by mouth every 8 (eight) hours as needed for nausea or vomiting.   rosuvastatin (CRESTOR) 10 MG tablet Take 10 mg by mouth daily.   sildenafil  (VIAGRA) 100 MG tablet Take 100 mg by mouth daily as needed for erectile dysfunction.      Allergies:   Morphine and related and Sulfa antibiotics   Social History   Socioeconomic History   Marital status: Married    Spouse name: Not on file   Number of children: Not on file   Years of education: Not on file   Highest education level: Not on file  Occupational History   Not on file  Tobacco Use   Smoking status: Some Days    Types: Cigars   Smokeless tobacco: Never   Tobacco comments:    07/14/2014 smoked cigs on and off x 40 yrs- none since 2014, but does smoke occ cigar-  Vaping Use   Vaping Use: Never used  Substance and Sexual Activity   Alcohol use: Yes    Alcohol/week: 0.0 standard drinks    Comment: occ   Drug use: No   Sexual activity: Yes  Other Topics Concern   Not on file  Social History Narrative   Not on file   Social Determinants of Health   Financial Resource Strain: Not on file  Food Insecurity: Not on file  Transportation Needs: Not on file  Physical Activity: Not on file  Stress: Not on file  Social Connections: Not on file     Family History: The patient's family history includes Alzheimer's disease in his father and mother; CAD in his maternal grandfather and mother; CVA in his mother; Dementia in his father; Heart attack in his maternal grandfather; Stroke in his maternal grandmother and paternal grandmother. There is no history of Colon cancer or Colon polyps.  ROS:   Please see the history of present illness.  All other systems reviewed and are negative.  Labs/Other Studies Reviewed:    The following studies were reviewed today:  Lexiscan Myoview  Nuclear stress EF: 70%. There was no ST segment deviation noted during stress. The study is normal. This is a low risk study. The left ventricular ejection fraction is hyperdynamic (>65%).   Normal resting and stress perfusion. No ischemia or infarction EF 70% Baseline ECG with  RBBB  Recent Labs: 07/17/2020: ALT 33; BUN 15; Creatinine, Ser 1.42; Hemoglobin 15.7; Platelets 168; Potassium 3.8; Sodium 134 04/27/2021: TSH 1.240  Recent Lipid Panel No results found for: CHOL, TRIG, HDL, CHOLHDL, VLDL, LDLCALC, LDLDIRECT    Physical Exam:    VS:  BP 136/82   Pulse (!) 52   Ht 5\' 10"  (1.778 m)   Wt 219 lb (99.3 kg)   BMI 31.42 kg/m     Wt Readings from Last 3 Encounters:  05/11/21 219 lb (99.3 kg)  04/27/21 217 lb (98.4 kg)  07/17/20 197 lb (89.4 kg)     GEN:  Well nourished, well developed in no acute distress HEENT: Normal NECK: No JVD;  No carotid bruits LYMPHATICS: No lymphadenopathy CARDIAC: RRR, no murmurs, rubs, gallops RESPIRATORY:  Clear to auscultation without rales, wheezing or rhonchi  ABDOMEN: Soft, non-tender, non-distended MUSCULOSKELETAL:  No edema; No deformity  SKIN: Warm and dry NEUROLOGIC:  Alert and oriented x 3 PSYCHIATRIC:  Normal affect   EKG:  EKG is ordered today.  The ekg ordered today demonstrates sinus bradycardia at rate of 52 bpm with RBBB, LAFB, stable when compared to previous  Diagnoses:    1. Mixed hyperlipidemia   2. Precordial pain   3. Right bundle branch block   4. Left anterior fascicular block   5. DOE (dyspnea on exertion)   6. Essential hypertension    Assessment and Plan:     Chest pain and dyspnea on exertion: He is having chest pain and dyspnea for several months, since having Covid infection in Feb 2022. He is concerned that he cannot tolerate the amount of activity that he could 1 year ago. Stress test 2/21 was low risk. He has a hx of hyperlipidemia and a family hx of CAD. Will get coronary CT to define coronary anatomy and get echocardiogram to evaluate systolic and diastolic function and valvular structure/function.   Essential hypertension: BP is elevated today. He does not monitor at home. Recent office visits in Epic show elevated BP as well. Encouraged him to get a home monitor. Will start  losartan 25 mg daily and have him get basic metabolic panel when he returns for echo in 2 weeks.   Bundle branch block: EKG stable with previous. He denies dizziness, pre-syncope, or syncope. Does get lightheaded when he bends forward on occasion. Will get echo for evaluation of LV function. Continue to follow.    Mixed hyperlipidemia:  LDL 70 on 10/29/20. On rosuvastatin 10mg . Followed by PCP.      Disposition: F/u 1-2 months after testing with Dr. Acie Fredrickson or APP   Medication Adjustments/Labs and Tests Ordered: Current medicines are reviewed at length with the patient today.  Concerns regarding medicines are outlined above.  Orders Placed This Encounter  Procedures   CT CORONARY MORPH W/CTA COR W/SCORE W/CA W/CM &/OR WO/CM   Basic metabolic panel   EKG 38-BOFB   ECHOCARDIOGRAM COMPLETE    No orders of the defined types were placed in this encounter.   Patient Instructions  Medication Instructions:  Your Physician recommend you continue on your current medication as directed.    *If you need a refill on your cardiac medications before your next appointment, please call your pharmacy*   Lab Work: Your physician recommends that you return for lab work today (3rd floor): BMP   If you have labs (blood work) drawn today and your tests are completely normal, you will receive your results only by: [place] (if you have MyChart) OR A paper copy in the mail If you have any lab test that is abnormal or we need to change your treatment, we will call you to review the results.   Testing/Procedures: Your physician has requested that you have an echocardiogram. Echocardiography is a painless test that uses sound waves to create images of your heart. It provides your doctor with information about the size and shape of your heart and how well your heart's chambers and valves are working. This procedure takes approximately one hour. There are no restrictions for this procedure. [place] cardiac CT will be scheduled at one of the below locations:  [facility] [place], [place] [address] [phone]  If scheduled at [facility], please arrive at the [facility] main entrance (entrance A) of [facility] 30 minutes prior to test start time. You can use the FREE valet parking offered at the main entrance (encouraged to control the heart rate for the test) Proceed to the [facility] Radiology Department (first floor) to check-in and test prep.  Please follow these instructions carefully (unless otherwise directed):  On the Night Before the Test: Be sure to Drink plenty of water. Do not consume any caffeinated/decaffeinated beverages or chocolate 12 hours prior to your test. Do not take any antihistamines 12 hours prior to your test.  On the Day of the Test: Drink plenty of water until 1 hour prior to the test. Do not eat any food 4 hours prior to the test. You may take your regular medications prior to the test.  Take metoprolol (Lopressor) two hours prior to test. HOLD Furosemide/Hydrochlorothiazide morning of the test. FEMALES- please wear underwire-free bra if available, avoid dresses & tight clothing      After the Test: Drink plenty of water. After receiving IV contrast, you may experience a mild flushed feeling. This is normal. On occasion, you may experience a mild rash up to 24 hours after the test. This is not dangerous. If this occurs, you can take Benadryl 25 mg and increase your fluid intake. If you experience trouble breathing, this can be serious. If it is severe call 911 IMMEDIATELY. If it is mild, please call our office. If you take any of these medications: Glipizide/Metformin, Avandament, Glucavance, please do not take 48 hours after completing test unless otherwise instructed.  Please allow 2-4 weeks for scheduling of routine cardiac CTs. Some insurance companies  require a pre-authorization which may delay scheduling of this test.   For non-scheduling related questions, please contact the cardiac imaging nurse navigator should you have any questions/concerns: Marchia Bond, Cardiac Imaging Nurse Navigator Gordy Clement, Cardiac Imaging Nurse Navigator [facility] Direct Office Dial: [phone]   For scheduling needs, including cancellations and rescheduling, please call [place], [phone].    Follow-Up: At [facility], you and your health needs are our priority.  As part of our continuing mission to provide you with exceptional heart care, we have created designated Provider Care Teams.  These Care Teams include your primary Cardiologist (physician) and Advanced Practice Providers (APPs -  Physician Assistants and Nurse Practitioners) who all work together to provide you with the care you need, when you need it.  We recommend signing up for the patient portal called "MyChart".  Sign up information is provided on this After Visit Summary.  MyChart is used to connect with patients for Virtual Visits (Telemedicine).  Patients are able to view lab/test results, encounter notes, upcoming appointments, etc.  Non-urgent messages can be sent to your provider as well.   To learn more about what you can do with MyChart, go to [email or web address].    Your next appointment:   Please Schedule an appointment with Dr. Acie Fredrickson in January    Signed, Cederick Broadnax, Lanice Schwab, NP  05/11/2021 12:12 PM    [place]

## 2021-05-11 ENCOUNTER — Ambulatory Visit (INDEPENDENT_AMBULATORY_CARE_PROVIDER_SITE_OTHER): Payer: Medicare Other | Admitting: Nurse Practitioner

## 2021-05-11 ENCOUNTER — Encounter (HOSPITAL_BASED_OUTPATIENT_CLINIC_OR_DEPARTMENT_OTHER): Payer: Self-pay | Admitting: Nurse Practitioner

## 2021-05-11 ENCOUNTER — Other Ambulatory Visit: Payer: Self-pay

## 2021-05-11 VITALS — BP 136/82 | HR 52 | Ht 70.0 in | Wt 219.0 lb

## 2021-05-11 DIAGNOSIS — I10 Essential (primary) hypertension: Secondary | ICD-10-CM

## 2021-05-11 DIAGNOSIS — E782 Mixed hyperlipidemia: Secondary | ICD-10-CM | POA: Diagnosis not present

## 2021-05-11 DIAGNOSIS — R0609 Other forms of dyspnea: Secondary | ICD-10-CM | POA: Diagnosis not present

## 2021-05-11 DIAGNOSIS — I4510 Unspecified right bundle-branch block: Secondary | ICD-10-CM | POA: Diagnosis not present

## 2021-05-11 DIAGNOSIS — R072 Precordial pain: Secondary | ICD-10-CM

## 2021-05-11 DIAGNOSIS — I444 Left anterior fascicular block: Secondary | ICD-10-CM | POA: Diagnosis not present

## 2021-05-11 LAB — BASIC METABOLIC PANEL
BUN/Creatinine Ratio: 8 — ABNORMAL LOW (ref 10–24)
BUN: 11 mg/dL (ref 8–27)
CO2: 27 mmol/L (ref 20–29)
Calcium: 9.6 mg/dL (ref 8.6–10.2)
Chloride: 102 mmol/L (ref 96–106)
Creatinine, Ser: 1.3 mg/dL — ABNORMAL HIGH (ref 0.76–1.27)
Glucose: 97 mg/dL (ref 70–99)
Potassium: 4.8 mmol/L (ref 3.5–5.2)
Sodium: 140 mmol/L (ref 134–144)
eGFR: 59 mL/min/{1.73_m2} — ABNORMAL LOW (ref >59)

## 2021-05-11 MED ORDER — LOSARTAN POTASSIUM 25 MG PO TABS: 25.0000 mg | ORAL_TABLET | Freq: Every day | ORAL | 3 refills | Status: DC

## 2021-05-11 NOTE — Patient Instructions (Signed)
Medication Instructions:  Your Physician recommend you continue on your current medication as directed.    *If you need a refill on your cardiac medications before your next appointment, please call your pharmacy*   Lab Work: Your physician recommends that you return for lab work today (3rd floor): BMP   If you have labs (blood work) drawn today and your tests are completely normal, you will receive your results only by: [place] (if you have MyChart) OR A paper copy in the mail If you have any lab test that is abnormal or we need to change your treatment, we will call you to review the results.   Testing/Procedures: Your physician has requested that you have an echocardiogram. Echocardiography is a painless test that uses sound waves to create images of your heart. It provides your doctor with information about the size and shape of your heart and how well your heart's chambers and valves are working. This procedure takes approximately one hour. There are no restrictions for this procedure. [facility] cardiac CT will be scheduled at one of the below locations:   [facility] [address] [place], [place] [address] [phone]  If scheduled at [facility], please arrive at the [facility] main entrance (entrance A) of [facility] 30 minutes prior to test start time. You can use the FREE valet parking offered at the main entrance (encouraged to control the heart rate for the test) Proceed to the [facility] Radiology Department (first floor) to check-in and test prep.  Please follow these instructions carefully (unless otherwise directed):  On the Night Before the Test: Be sure to Drink plenty of water. Do not consume any caffeinated/decaffeinated beverages or chocolate 12 hours prior to your test. Do not take any antihistamines 12 hours prior to your test.  On the Day of the Test: Drink plenty of water  until 1 hour prior to the test. Do not eat any food 4 hours prior to the test. You may take your regular medications prior to the test.  Take metoprolol (Lopressor) two hours prior to test. HOLD Furosemide/Hydrochlorothiazide morning of the test. FEMALES- please wear underwire-free bra if available, avoid dresses & tight clothing      After the Test: Drink plenty of water. After receiving IV contrast, you may experience a mild flushed feeling. This is normal. On occasion, you may experience a mild rash up to 24 hours after the test. This is not dangerous. If this occurs, you can take Benadryl 25 mg and increase your fluid intake. If you experience trouble breathing, this can be serious. If it is severe call 911 IMMEDIATELY. If it is mild, please call our office. If you take any of these medications: Glipizide/Metformin, Avandament, Glucavance, please do not take 48 hours after completing test unless otherwise instructed.  Please allow 2-4 weeks for scheduling of routine cardiac CTs. Some insurance companies require a pre-authorization which may delay scheduling of this test.   For non-scheduling related questions, please contact the cardiac imaging nurse navigator should you have any questions/concerns: Marchia Bond, Cardiac Imaging Nurse Navigator Gordy Clement, Cardiac Imaging Nurse Navigator [facility] Direct Office Dial: [phone]   For scheduling needs, including cancellations and rescheduling, please call [place], [phone].    Follow-Up: At [facility], you and your health needs are our priority.  As part of our continuing mission to provide you with exceptional heart care,  we have created designated Provider Care Teams.  These Care Teams include your primary Cardiologist (physician) and Advanced Practice Providers (APPs -  Physician Assistants and Nurse Practitioners) who all work together to provide you with the care you need, when you need  it.  We recommend signing up for the patient portal called "MyChart".  Sign up information is provided on this After Visit Summary.  MyChart is used to connect with patients for Virtual Visits (Telemedicine).  Patients are able to view lab/test results, encounter notes, upcoming appointments, etc.  Non-urgent messages can be sent to your provider as well.   To learn more about what you can do with MyChart, go to [email or web address].    Your next appointment:   Please Schedule an appointment with Dr. Acie Fredrickson in January

## 2021-05-21 ENCOUNTER — Telehealth (HOSPITAL_COMMUNITY): Payer: Self-pay | Admitting: Emergency Medicine

## 2021-05-21 NOTE — Telephone Encounter (Signed)
Reaching out to patient to offer assistance regarding upcoming cardiac imaging study; pt verbalizes understanding of appt date/time, parking situation and where to check in, pre-test NPO status and medications ordered, and verified current allergies; name and call back number provided for further questions should they arise Marchia Bond RN Navigator Cardiac Imaging [facility] [phone] office [phone] cell  No med changes, holding viagra x 2 days prior to scan Denies iv issues Arrival time 1130a

## 2021-05-24 ENCOUNTER — Other Ambulatory Visit: Payer: Self-pay

## 2021-05-24 ENCOUNTER — Ambulatory Visit (HOSPITAL_COMMUNITY)
Admission: RE | Admit: 2021-05-24 | Discharge: 2021-05-24 | Disposition: A | Discharge status: Home / Self Care | Payer: Medicare Other | Source: Ambulatory Visit | Attending: Nurse Practitioner | Admitting: Nurse Practitioner

## 2021-05-24 DIAGNOSIS — I700 Atherosclerosis of aorta: Secondary | ICD-10-CM | POA: Diagnosis not present

## 2021-05-24 DIAGNOSIS — R072 Precordial pain: Secondary | ICD-10-CM | POA: Diagnosis not present

## 2021-05-24 MED ORDER — NITROGLYCERIN 0.4 MG SL SUBL
SUBLINGUAL_TABLET | SUBLINGUAL | Status: AC
  Filled 2021-05-24: qty 2

## 2021-05-24 MED ORDER — NITROGLYCERIN 0.4 MG SL SUBL
0.8000 mg | SUBLINGUAL_TABLET | Freq: Once | SUBLINGUAL | Status: AC
  Administered 2021-05-24: 0.8 mg via SUBLINGUAL

## 2021-05-24 MED ORDER — IOHEXOL 350 MG/ML SOLN
100.0000 mL | Freq: Once | INTRAVENOUS | Status: AC | PRN
  Administered 2021-05-24: 95 mL via INTRAVENOUS

## 2021-05-25 ENCOUNTER — Telehealth: Payer: Self-pay | Admitting: Nurse Practitioner

## 2021-05-25 ENCOUNTER — Other Ambulatory Visit (HOSPITAL_BASED_OUTPATIENT_CLINIC_OR_DEPARTMENT_OTHER): Payer: Self-pay | Admitting: Family

## 2021-05-25 ENCOUNTER — Ambulatory Visit (INDEPENDENT_AMBULATORY_CARE_PROVIDER_SITE_OTHER): Payer: Medicare Other

## 2021-05-25 DIAGNOSIS — R072 Precordial pain: Secondary | ICD-10-CM

## 2021-05-25 DIAGNOSIS — I10 Essential (primary) hypertension: Secondary | ICD-10-CM

## 2021-05-25 DIAGNOSIS — E782 Mixed hyperlipidemia: Secondary | ICD-10-CM

## 2021-05-25 LAB — BASIC METABOLIC PANEL
BUN/Creatinine Ratio: 11 (ref 10–24)
BUN: 12 mg/dL (ref 8–27)
CO2: 25 mmol/L (ref 20–29)
Calcium: 9.6 mg/dL (ref 8.6–10.2)
Chloride: 103 mmol/L (ref 96–106)
Creatinine, Ser: 1.14 mg/dL (ref 0.76–1.27)
Glucose: 99 mg/dL (ref 70–99)
Potassium: 4.6 mmol/L (ref 3.5–5.2)
Sodium: 142 mmol/L (ref 134–144)
eGFR: 69 mL/min/{1.73_m2} (ref >59)

## 2021-05-25 LAB — ECHOCARDIOGRAM COMPLETE
AR max vel: 3.31 cm2
AV Area VTI: 3.69 cm2
AV Area mean vel: 3.41 cm2
AV Mean grad: 4 mmHg
AV Peak grad: 9.9 mmHg
AV Vena cont: 0.49 cm
Ao pk vel: 1.57 m/s
Area-P 1/2: 2.55 cm2
Calc EF: 66.4 %
P 1/2 time: 689 msec
S' Lateral: 2.74 cm
Single Plane A2C EF: 65.4 %
Single Plane A4C EF: 66.3 %

## 2021-05-25 NOTE — Telephone Encounter (Signed)
Patrick Mathews is returning Ivy's call in regards to his CT results.

## 2021-05-25 NOTE — Telephone Encounter (Signed)
Emmaline Life, NP  05/25/2021  1:36 PM EST     Echo results overall look great. Normal pumping function 55-60%, no LVH, mild dilatation of left ventricle. 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 Trivial regurgitation of mitral valve. Trivial TR.  We will continue to monitor these however they do not explain shortness of breath or chest discomfort. How is he feeling? 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 He should make a follow-up appointment for 1-2 months with Dr. Acie Fredrickson or APP and see PCP sooner if symptoms persist.    Emmaline Life, NP  05/25/2021  8:12 AM EST     Overall result is reassuring for no coronary artery blockage causing chest discomfort. It does reveal aortic atherosclerosis and coronary calcium score of 69 so we need to continue to be aggressive with cholesterol management and keep his LDL below 70. If not ordered by PCP, would like to recheck lipid panel at next visit to make certain LDL is at goal. If not, would favor increasing rosuvastatin. Continue with current medical plan for upcoming echo and follow-up appointment.    The patient has been notified of the result and verbalized understanding.  All questions (if any) were answered. Antonieta Iba, RN 05/25/2021 2:38 PM  Patient states that he is feeling okay. Not having as much shortness of breath or chest discomfort.  I have scheduled him to see Dr. Acie Fredrickson 1/27 with lab work at the same time.

## 2021-05-31 DIAGNOSIS — Z6830 Body mass index (BMI) 30.0-30.9, adult: Secondary | ICD-10-CM | POA: Diagnosis not present

## 2021-05-31 DIAGNOSIS — E6609 Other obesity due to excess calories: Secondary | ICD-10-CM | POA: Diagnosis not present

## 2021-05-31 DIAGNOSIS — M549 Dorsalgia, unspecified: Secondary | ICD-10-CM | POA: Diagnosis not present

## 2021-05-31 DIAGNOSIS — R300 Dysuria: Secondary | ICD-10-CM | POA: Diagnosis not present

## 2021-05-31 DIAGNOSIS — M5136 Other intervertebral disc degeneration, lumbar region: Secondary | ICD-10-CM | POA: Diagnosis not present

## 2021-06-18 DIAGNOSIS — R194 Change in bowel habit: Secondary | ICD-10-CM | POA: Diagnosis not present

## 2021-07-09 ENCOUNTER — Ambulatory Visit: Payer: Medicare Other | Admitting: Cardiovascular Disease

## 2021-07-09 ENCOUNTER — Other Ambulatory Visit: Payer: Medicare Other

## 2021-07-09 DIAGNOSIS — R1084 Generalized abdominal pain: Secondary | ICD-10-CM | POA: Diagnosis not present

## 2021-07-09 DIAGNOSIS — K635 Polyp of colon: Secondary | ICD-10-CM | POA: Diagnosis not present

## 2021-07-09 DIAGNOSIS — D128 Benign neoplasm of rectum: Secondary | ICD-10-CM | POA: Diagnosis not present

## 2021-07-13 DIAGNOSIS — K635 Polyp of colon: Secondary | ICD-10-CM | POA: Diagnosis not present

## 2021-08-15 ENCOUNTER — Encounter: Payer: Self-pay | Admitting: Cardiovascular Disease

## 2021-08-15 NOTE — Progress Notes (Signed)
Cardiology Office Note:    Date:  08/16/2021   ID:  Patrick Mathews, DOB [birth year], MRN [id number]  PCP:  Sharilyn Sites, MD  Cardiologist:  Thurmond Hildebran  Electrophysiologist:  None   Referring MD: Sharilyn Sites, MD   Chief Complaint  Patient presents with   Chest Pain        Hyperlipidemia    History of Present Illness:    Patrick Mathews is a 71 y.o. male with a hx of  CP .    We were asked to see him today for this CP by Dr. Hilma Favors .    Hx of HLD.   CP / pressure for the past 6 months.  Active,  Golf, fishes, works out in the yard.  Walks 1-2 times a week,  Perhaps a mild  Has CP - frequently while in the recliner in the evenings.  Perhaps several hours after he has eaten dinner. Thinks the episodes may be slightly worsening recently. He describes the pain as a pressure-like sensation.  The discomfort is midsternal.  It does not radiate to his shoulders or neck or arm.  It is associated with some increased shortness of breath.  No diaphoresis no presyncope or syncope. Occurs serval times a week.  May last 3-4 minutes , then eases off   Just started rosuvastatin   September 27, 2019:  Patrick Mathews was seen several months ago with onset of chest pressure.  These episodes would last several minutes and then eased off.  He has a history of hyperlipidemia and was started on rosuvastatin several months ago. Stress Myoview study was low risk.  No evidence of ischemia and had normal left ventricular systolic function. No further CP ,  Remains active   August 16, 2021 Patrick Mathews is seen for follow up of his CP and HLD He was seen by Christen Bame, NP in Nov, 2022 for follow up of hhis HLD . He was having progrossive CP and DOE  He used to walk 2 miles a day but could not walk 100 yards without getting winded.   This seems to have improved over the past several months    Is having hip issues now that limit his walking   Coronary CTA: Coronary calcium score: The patient's coronary artery  calcium score is 69, which places the patient in the 38th percentile RCA : normal  LAD - mild CAD LCx - mild CAD   Echo: 05/25/21:   normal LV function, mild - mod AI, trivial MR , Trivial TR  Had COVID a year ago  Having issues with his stomach,   Has had 2 ortho surgeries and covid since he walked regularly . 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     Past Medical History:  Diagnosis Date   Adjacent segment disease with spinal stenosis    Arthritis    "knees; back" (07/14/2014)   Asthma    CAD (coronary artery disease)    Chest pain    Exertional dyspnea    GERD (gastroesophageal reflux disease)    Hyperlipidemia    Hypertension    Pneumonia 11/2013   Primary localized osteoarthritis of left knee    Primary localized osteoarthritis of right knee 05/04/2020   Seasonal allergies     Past Surgical History:  Procedure Laterality Date   ANTERIOR CERVICAL DECOMP/DISCECTOMY FUSION  2009   Dr Evlyn Kanner SURGERY  BUNIONECTOMY Right  2013   COLONOSCOPY N/A 11/06/2012   Procedure: COLONOSCOPY;  Surgeon: Jamesetta So, MD;  Location: [facility];  Service: Gastroenterology;  Laterality: N/A;   FOOT SURGERY     JOINT REPLACEMENT     KNEE ARTHROSCOPY Bilateral 34's   POSTERIOR LUMBAR FUSION  1985; 2009   Dr Joya Salm   TONSILLECTOMY  1950's   TOTAL KNEE ARTHROPLASTY Left 07/14/2014   TOTAL KNEE ARTHROPLASTY Left 07/14/2014   Procedure: LEFT TOTAL KNEE ARTHROPLASTY;  Surgeon: [name], MD;  Location: [facility];  Service: Orthopedics;  Laterality: Left;   TOTAL KNEE ARTHROPLASTY Right 05/11/2020   Procedure: TOTAL KNEE ARTHROPLASTY;  Surgeon: Elsie Saas, MD;  Location: [facility];  Service: Orthopedics;  Laterality: Right;   WISDOM TOOTH EXTRACTION      Current Medications: Current Meds  Medication Sig   acetaminophen (TYLENOL) 325 MG tablet Take 650 mg by mouth every 6 (six) hours as needed for  mild pain or moderate pain.   cyclobenzaprine (FLEXERIL) 10 MG tablet Take 1 tablet (10 mg total) by mouth 3 (three) times daily as needed for muscle spasms.   dicyclomine (BENTYL) 20 MG tablet    docusate sodium (COLACE) 100 MG capsule Take 1 capsule (100 mg total) by mouth 2 (two) times daily.   gabapentin (NEURONTIN) 600 MG tablet Take 600 mg by mouth at bedtime.   ondansetron (ZOFRAN) 4 MG tablet Take 1 tablet (4 mg total) by mouth every 8 (eight) hours as needed for nausea or vomiting.   rosuvastatin (CRESTOR) 10 MG tablet Take 10 mg by mouth daily.   sildenafil (VIAGRA) 100 MG tablet Take 100 mg by mouth daily as needed for erectile dysfunction.    [DISCONTINUED] losartan (COZAAR) 25 MG tablet Take 1 tablet (25 mg total) by mouth daily.     Allergies:   Morphine and related and Sulfa antibiotics   Social History   Socioeconomic History   Marital status: Married    Spouse name: Not on file   Number of children: Not on file   Years of education: Not on file   Highest education level: Not on file  Occupational History   Not on file  Tobacco Use   Smoking status: Some Days    Types: Cigars   Smokeless tobacco: Never   Tobacco comments:    07/14/2014 smoked cigs on and off x 40 yrs- none since 2014, but does smoke occ cigar-  Vaping Use   Vaping Use: Never used  Substance and Sexual Activity   Alcohol use: Yes    Alcohol/week: 0.0 standard drinks    Comment: occ   Drug use: No   Sexual activity: Yes  Other Topics Concern   Not on file  Social History Narrative   Not on file   Social Determinants of Health   Financial Resource Strain: Not on file  Food Insecurity: Not on file  Transportation Needs: Not on file  Physical Activity: Not on file  Stress: Not on file  Social Connections: Not on file     Family History: The patient's family history includes Alzheimer's disease in his father and mother; CAD in his maternal grandfather and mother; CVA in his mother;  Dementia in his father; Heart attack in his maternal grandfather; Stroke in his maternal grandmother and paternal grandmother. There is no history of Colon cancer or Colon polyps.  ROS:   Please see the history of present illness.     All other systems reviewed  and are negative.  EKGs/Labs/Other Studies Reviewed:    The following studies were reviewed today:    Recent Labs: 04/27/2021: TSH 1.240 05/25/2021: BUN 12; Creatinine, Ser 1.14; Potassium 4.6; Sodium 142  Recent Lipid Panel No results found for: CHOL, TRIG, HDL, CHOLHDL, VLDL, LDLCALC, LDLDIRECT  Physical Exam:    Physical Exam: Blood pressure 128/72, pulse (!) 48, height '5\' 10"'$  (1.778 m), weight 218 lb 3.2 oz (99 kg), SpO2 98 %.  GEN:  Well nourished, well developed in no acute distress HEENT: Normal NECK: No JVD; No carotid bruits LYMPHATICS: No lymphadenopathy CARDIAC: RRR , no murmurs, rubs, gallops RESPIRATORY:  Clear to auscultation without rales, wheezing or rhonchi  ABDOMEN: Soft, non-tender, non-distended MUSCULOSKELETAL:  No edema; No deformity  SKIN: Warm and dry NEUROLOGIC:  Alert and oriented x 3   EKG: [name]    ASSESSMENT:    1. DOE (dyspnea on exertion)   2. Mixed hyperlipidemia   3. Essential hypertension    PLAN:     Chest pain : Coronary CT angiogram reveals no significant obstructive coronary artery disease. .  2.  Hyperlipidemia:   We will check labs today.  3.  Shortness of breath with exertion: He is to walk up to 2 miles a day.  Now he become short of breath after several 100 yards.  He has had several things that have occurred since he last walks several miles per day.  He has had 2 different orthopedic surgeries.  He has had COVID.  He wonders whether or not he has long COVID.  His cardiac studies have looked okay.  His echocardiogram reveals normal function.  He has no significant valvular disease that would cause shortness of breath.  His coronary CT angiogram reveals mild to  moderate coronary artery disease without any real obstructions.  At this point I do not have a clear etiology for his DOE .    Medication Adjustments/Labs and Tests Ordered: Current medicines are reviewed at length with the patient today.  Concerns regarding medicines are outlined above.  Orders Placed This Encounter  Procedures   Lipid panel   ALT   Basic metabolic panel   Meds ordered this encounter  Medications   losartan (COZAAR) 25 MG tablet    Sig: Take 1 tablet (25 mg total) by mouth daily.    Dispense:  90 tablet    Refill:  3    Patient Instructions  Medication Instructions:  Your physician recommends that you continue on your current medications as directed. Please refer to the Current Medication list given to you today.  *If you need a refill on your cardiac medications before your next appointment, please call your pharmacy*   Lab Work: TODAY: Lipids, ALT, BMET If you have labs (blood work) drawn today and your tests are completely normal, you will receive your results only by: [place] (if you have MyChart) OR A paper copy in the mail If you have any lab test that is abnormal or we need to change your treatment, we will call you to review the results.   Testing/Procedures: NONE   Follow-Up: At [facility], you and your health needs are our priority.  As part of our continuing mission to provide you with exceptional heart care, we have created designated Provider Care Teams.  These Care Teams include your primary Cardiologist (physician) and Advanced Practice Providers (APPs -  Physician Assistants and Nurse Practitioners) who all work together to provide you with the care you need, when  you need it.  We recommend signing up for the patient portal called "MyChart".  Sign up information is provided on this After Visit Summary.  MyChart is used to connect with patients for Virtual Visits (Telemedicine).  Patients are able to view lab/test results,  encounter notes, upcoming appointments, etc.  Non-urgent messages can be sent to your provider as well.   To learn more about what you can do with MyChart, go to [email or web address].    Your next appointment:   1 year(s)  The format for your next appointment:   In Person  Provider:   Christen Bame, NP       Other Instructions Information on Long Covid  [facility]   Signed, Mertie Moores, MD  08/16/2021 11:52 AM    [place]

## 2021-08-16 ENCOUNTER — Other Ambulatory Visit: Payer: Medicare Other

## 2021-08-16 ENCOUNTER — Encounter: Payer: Self-pay | Admitting: Cardiovascular Disease

## 2021-08-16 ENCOUNTER — Other Ambulatory Visit: Payer: Self-pay

## 2021-08-16 ENCOUNTER — Ambulatory Visit (INDEPENDENT_AMBULATORY_CARE_PROVIDER_SITE_OTHER): Payer: Medicare Other | Admitting: Cardiovascular Disease

## 2021-08-16 VITALS — BP 128/72 | HR 48 | Ht 70.0 in | Wt 218.2 lb

## 2021-08-16 DIAGNOSIS — E782 Mixed hyperlipidemia: Secondary | ICD-10-CM | POA: Diagnosis not present

## 2021-08-16 DIAGNOSIS — R0609 Other forms of dyspnea: Secondary | ICD-10-CM | POA: Diagnosis not present

## 2021-08-16 DIAGNOSIS — I2510 Atherosclerotic heart disease of native coronary artery without angina pectoris: Secondary | ICD-10-CM

## 2021-08-16 DIAGNOSIS — I10 Essential (primary) hypertension: Secondary | ICD-10-CM

## 2021-08-16 LAB — LIPID PANEL
Chol/HDL Ratio: 3.9 ratio (ref 0.0–5.0)
Cholesterol, Total: 145 mg/dL (ref 100–199)
HDL: 37 mg/dL — ABNORMAL LOW (ref >39)
LDL Chol Calc (NIH): 86 mg/dL (ref 0–99)
Triglycerides: 123 mg/dL (ref 0–149)
VLDL Cholesterol Cal: 22 mg/dL (ref 5–40)

## 2021-08-16 LAB — BASIC METABOLIC PANEL
BUN/Creatinine Ratio: 10 (ref 10–24)
BUN: 13 mg/dL (ref 8–27)
CO2: 25 mmol/L (ref 20–29)
Calcium: 9.5 mg/dL (ref 8.6–10.2)
Chloride: 103 mmol/L (ref 96–106)
Creatinine, Ser: 1.34 mg/dL — ABNORMAL HIGH (ref 0.76–1.27)
Glucose: 102 mg/dL — ABNORMAL HIGH (ref 70–99)
Potassium: 4.5 mmol/L (ref 3.5–5.2)
Sodium: 141 mmol/L (ref 134–144)
eGFR: 57 mL/min/{1.73_m2} — ABNORMAL LOW (ref >59)

## 2021-08-16 LAB — ALT: ALT: 39 IU/L (ref 0–44)

## 2021-08-16 MED ORDER — LOSARTAN POTASSIUM 25 MG PO TABS: 25.0000 mg | ORAL_TABLET | Freq: Every day | ORAL | 3 refills | Status: DC

## 2021-08-16 NOTE — Patient Instructions (Signed)
Medication Instructions:  ?Your physician recommends that you continue on your current medications as directed. Please refer to the Current Medication list given to you today. ? ?*If you need a refill on your cardiac medications before your next appointment, please call your pharmacy* ? ? ?Lab Work: ?TODAY: Lipids, ALT, BMET ?If you have labs (blood work) drawn today and your tests are completely normal, you will receive your results only by: ?MyChart Message (if you have MyChart) OR ?A paper copy in the mail ?If you have any lab test that is abnormal or we need to change your treatment, we will call you to review the results. ? ? ?Testing/Procedures: ?NONE ? ? ?Follow-Up: ?At [facility], you and your health needs are our priority.  As part of our continuing mission to provide you with exceptional heart care, we have created designated Provider Care Teams.  These Care Teams include your primary Cardiologist (physician) and Advanced Practice Providers (APPs -  Physician Assistants and Nurse Practitioners) who all work together to provide you with the care you need, when you need it. ? ?We recommend signing up for the patient portal called "MyChart".  Sign up information is provided on this After Visit Summary.  MyChart is used to connect with patients for Virtual Visits (Telemedicine).  Patients are able to view lab/test results, encounter notes, upcoming appointments, etc.  Non-urgent messages can be sent to your provider as well.   ?To learn more about what you can do with MyChart, go to [email or web address].   ? ?Your next appointment:   ?1 year(s) ? ?The format for your next appointment:   ?In Person ? ?Provider:   ?Christen Bame, NP      ? ?Other Instructions ?Information on Long Covid ? ?[facility] ?

## 2021-08-18 DIAGNOSIS — M25551 Pain in right hip: Secondary | ICD-10-CM | POA: Diagnosis not present

## 2021-08-23 DIAGNOSIS — M25551 Pain in right hip: Secondary | ICD-10-CM | POA: Diagnosis not present

## 2021-09-06 DIAGNOSIS — M79672 Pain in left foot: Secondary | ICD-10-CM | POA: Diagnosis not present

## 2021-09-06 DIAGNOSIS — M2012 Hallux valgus (acquired), left foot: Secondary | ICD-10-CM | POA: Diagnosis not present

## 2021-09-06 DIAGNOSIS — S93332D Other subluxation of left foot, subsequent encounter: Secondary | ICD-10-CM | POA: Diagnosis not present

## 2021-09-06 DIAGNOSIS — M79675 Pain in left toe(s): Secondary | ICD-10-CM | POA: Diagnosis not present

## 2021-09-06 DIAGNOSIS — M779 Enthesopathy, unspecified: Secondary | ICD-10-CM | POA: Diagnosis not present

## 2021-10-04 ENCOUNTER — Telehealth: Payer: Self-pay | Admitting: *Deleted

## 2021-10-04 DIAGNOSIS — M25551 Pain in right hip: Secondary | ICD-10-CM | POA: Diagnosis not present

## 2021-10-04 NOTE — Telephone Encounter (Signed)
? ?  Pre-operative Risk Assessment  ?  ?Patient Name: Patrick Mathews  ?DOB: 02-28-1951 ?MRN: [id number]  ? ?  ? ?Request for Surgical Clearance   ? ?Procedure:   RIGHT TOTAL HIP REPLACEMENT ? ?Date of Surgery:  Clearance TBD                              ?   ?Surgeon:  DR. Edmonia Lynch ?Surgeon's Group or Practice Name:  [facility] ?Phone number:  [phone] ATTN: [facility]T 6314 ?Fax number:  [phone] ?  ?Type of Clearance Requested:   ?- Medical  ?  ?Type of Anesthesia:   CHOICE ?  ?Additional requests/questions:   ? ?Signed, ?Julaine Hua   ?10/04/2021, 2:20 PM  ? ?

## 2021-10-04 NOTE — Telephone Encounter (Signed)
? ? ?  Name: Patrick Mathews  ?DOB: 10/15/1950  ?MRN: [id number] ? ?Primary Cardiologist: Mertie Moores, MD ? ? ?Preoperative team, please contact this patient and set up a phone call appointment for further preoperative risk assessment. Please obtain consent and complete medication review. Thank you for your help. ? ?I confirm that guidance regarding antiplatelet and oral anticoagulation therapy has been completed and, if necessary, noted below. ? ? ? ?Ledora Bottcher, PA ?10/04/2021, 4:40 PM ?[facility] ?[address] ?[place], [place] [address] ? ? ?

## 2021-10-04 NOTE — Telephone Encounter (Signed)
Pt agreeable to plan of care for tele pre op appt 10/11/21 @ 9 am. Med rec and consent are done.  ? ?  ?Patient Consent for Virtual Visit  ? ? ?   ? ?Patrick Mathews has provided verbal consent on 10/04/2021 for a virtual visit (video or telephone). ? ? ?CONSENT FOR VIRTUAL VISIT FOR:  Patrick Mathews  ?By participating in this virtual visit I agree to the following: ? ?I hereby voluntarily request, consent and authorize [facility] and its employed or contracted physicians, physician assistants, nurse practitioners or other licensed health care professionals (the Practitioner), to provide me with telemedicine health care services (the ?Services") as deemed necessary by the treating Practitioner. I acknowledge and consent to receive the Services by the Practitioner via telemedicine. I understand that the telemedicine visit will involve communicating with the Practitioner through live audiovisual communication technology and the disclosure of certain medical information by electronic transmission. I acknowledge that I have been given the opportunity to request an in-person assessment or other available alternative prior to the telemedicine visit and am voluntarily participating in the telemedicine visit. ? ?I understand that I have the right to withhold or withdraw my consent to the use of telemedicine in the course of my care at any time, without affecting my right to future care or treatment, and that the Practitioner or I may terminate the telemedicine visit at any time. I understand that I have the right to inspect all information obtained and/or recorded in the course of the telemedicine visit and may receive copies of available information for a reasonable fee.  I understand that some of the potential risks of receiving the Services via telemedicine include:  ?Delay or interruption in medical evaluation due to technological equipment failure or disruption; ?Information transmitted may not be sufficient  (e.g. poor resolution of images) to allow for appropriate medical decision making by the Practitioner; and/or  ?In rare instances, security protocols could fail, causing a breach of personal health information. ? ?Furthermore, I acknowledge that it is my responsibility to provide information about my medical history, conditions and care that is complete and accurate to the best of my ability. I acknowledge that Practitioner's advice, recommendations, and/or decision may be based on factors not within their control, such as incomplete or inaccurate data provided by me or distortions of diagnostic images or specimens that may result from electronic transmissions. I understand that the practice of medicine is not an exact science and that Practitioner makes no warranties or guarantees regarding treatment outcomes. I acknowledge that a copy of this consent can be made available to me via my patient portal ([facility] [place]), or I can request a printed copy by calling the office of [facility].   ? ?I understand that my insurance will be billed for this visit.  ? ?I have read or had this consent read to me. ?I understand the contents of this consent, which adequately explains the benefits and risks of the Services being provided via telemedicine.  ?I have been provided ample opportunity to ask questions regarding this consent and the Services and have had my questions answered to my satisfaction. ?I give my informed consent for the services to be provided through the use of telemedicine in my medical care ? ? ? ?

## 2021-10-04 NOTE — Telephone Encounter (Signed)
Pt agreeable to plan of care for tele pre op appt 10/11/21 @ 9 am. Med rec and consent are done.  ?

## 2021-10-06 DIAGNOSIS — H2513 Age-related nuclear cataract, bilateral: Secondary | ICD-10-CM | POA: Diagnosis not present

## 2021-10-06 DIAGNOSIS — H52203 Unspecified astigmatism, bilateral: Secondary | ICD-10-CM | POA: Diagnosis not present

## 2021-10-06 DIAGNOSIS — H40013 Open angle with borderline findings, low risk, bilateral: Secondary | ICD-10-CM | POA: Diagnosis not present

## 2021-10-07 NOTE — Progress Notes (Addendum)
? ?Virtual Visit via Telephone Note  ? ?This visit type was conducted due to national recommendations for restrictions regarding the COVID-19 Pandemic (e.g. social distancing) in an effort to limit this patient's exposure and mitigate transmission in our community.  Due to his co-morbid illnesses, this patient is at least at moderate risk for complications without adequate follow up.  This format is felt to be most appropriate for this patient at this time.  The patient did not have access to video technology/had technical difficulties with video requiring transitioning to audio format only (telephone).  All issues noted in this document were discussed and addressed.  No physical exam could be performed with this format.  Please refer to the patient's chart for his  consent to telehealth for [facility]. ? ?Evaluation Performed:  Preoperative cardiovascular risk assessment ?_____________  ? ?Date:  10/07/2021  ? ?Patient ID:  Patrick, Mathews [birth year], MRN [id number] ?Patient Location:  ?Home ?Provider location:   ?Office ? ?Primary Care Provider:  Sharilyn Sites, MD ?Primary Cardiologist:  Mertie Moores, MD ? ?Chief Complaint  ?  ?71 y.o. y/o male with a h/o CAD, HLD, asthma, HTN, seasonal allergies, who is pending right total hip replacement, and presents today for telephonic preoperative cardiovascular risk assessment. ? ?Past Medical History  ?  ?Past Medical History:  ?Diagnosis Date  ? Adjacent segment disease with spinal stenosis   ? Arthritis   ? "knees; back" (07/14/2014)  ? Asthma   ? CAD (coronary artery disease)   ? Chest pain   ? Exertional dyspnea   ? GERD (gastroesophageal reflux disease)   ? Hyperlipidemia   ? Hypertension   ? Pneumonia 11/2013  ? Primary localized osteoarthritis of left knee   ? Primary localized osteoarthritis of right knee 05/04/2020  ? Seasonal allergies   ? ?Past Surgical History:  ?Procedure Laterality Date  ? ANTERIOR CERVICAL DECOMP/DISCECTOMY FUSION  2009  ? Dr Joya Salm   ? BACK SURGERY    ? BUNIONECTOMY Right  2013  ? COLONOSCOPY N/A 11/06/2012  ? Procedure: COLONOSCOPY;  Surgeon: Jamesetta So, MD;  [facility];  Service: Gastroenterology;  Laterality: N/A;  ? FOOT SURGERY    ? JOINT REPLACEMENT    ? KNEE ARTHROSCOPY Bilateral 1990's  ? [place]; 2009  ? Dr Joya Salm  ? TONSILLECTOMY  1950's  ? TOTAL KNEE ARTHROPLASTY Left 07/14/2014  ? TOTAL KNEE ARTHROPLASTY Left 07/14/2014  ? Procedure: LEFT TOTAL KNEE ARTHROPLASTY;  Surgeon: [name], MD;  Location: [place];  Service: Orthopedics;  Laterality: Left;  ? TOTAL KNEE ARTHROPLASTY Right 05/11/2020  ? Procedure: TOTAL KNEE ARTHROPLASTY;  Surgeon: Elsie Saas, MD;  Location: [facility];  Service: Orthopedics;  Laterality: Right;  ? WISDOM TOOTH EXTRACTION    ? ? ?Allergies ? ?Allergies  ?Allergen Reactions  ? Morphine And Related Nausea And Vomiting  ? Sulfa Antibiotics Hives  ? ? ?History of Present Illness  ?  ?Patrick Mathews is a 71 y.o. male who presents via audio/video conferencing for a telehealth visit today.  Pt was last seen in cardiology clinic on 08/16/21 by Dr. Acie Fredrickson.  At that time Patrick Mathews was having issues with hip pain and continued dyspnea on exertion. He had been evaluated for this in fall 2022 with coronary CT showing mild CAD and borderline dilated ascending aorta and echo with normal LV function, mild-moderate AI, trivial MR, TR. The patient is now pending hip replacement.  Since his  last visit, Patrick Mathews states that he is doing well from a cardiovascular perspective however he is still having quite a bit of pain with his hip.  He will be starting physical therapy today and hopes that he can obtain some more range of motion to help with managing his pain.  He denies any chest pain, shortness of breath, syncope at this time. ? ? ?Home Medications  ?  ?Prior to Admission medications   ?Medication Sig Start Date End Date Taking? Authorizing Provider  ?acetaminophen (TYLENOL)  325 MG tablet Take 650 mg by mouth every 6 (six) hours as needed for mild pain or moderate pain.    [provider]  ?busPIRone (BUSPAR) 10 MG tablet Take 10 mg by mouth 3 (three) times daily as needed. ?Patient not taking: Reported on 08/16/2021 04/05/21   [provider]  ?cyclobenzaprine (FLEXERIL) 10 MG tablet Take 1 tablet (10 mg total) by mouth 3 (three) times daily as needed for muscle spasms. 02/07/20   Newman Pies, MD  ?dicyclomine (BENTYL) 20 MG tablet     [provider]  ?docusate sodium (COLACE) 100 MG capsule Take 1 capsule (100 mg total) by mouth 2 (two) times daily. 02/07/20   Newman Pies, MD  ?gabapentin (NEURONTIN) 600 MG tablet Take 600 mg by mouth at bedtime.    [provider]  ?Hyoscyamine Sulfate SL (LEVSIN/SL) 0.125 MG SUBL 0.125 to 0.250 SL q4hrs PRN Do not exceed 1.5 mg/day. 06/10/20   Pisciotta, Elmyra Ricks, PA-C  ?losartan (COZAAR) 25 MG tablet Take 1 tablet (25 mg total) by mouth daily. 08/16/21   Nahser, Wonda Cheng, MD  ?ondansetron (ZOFRAN) 4 MG tablet Take 1 tablet (4 mg total) by mouth every 8 (eight) hours as needed for nausea or vomiting. 06/10/20   Pisciotta, Elmyra Ricks, PA-C  ?Peppermint Oil (IBGARD) 90 MG CPCR Take 2 tablets by mouth daily.    [provider]  ?rosuvastatin (CRESTOR) 10 MG tablet Take 10 mg by mouth daily. ?Patient not taking: Reported on 10/04/2021    [provider]  ?sildenafil (VIAGRA) 100 MG tablet Take 100 mg by mouth daily as needed for erectile dysfunction.  04/21/20   [provider]  ? ? ?Physical Exam  ?  ?Vital Signs:  Patrick Mathews does not have vital signs available for review today.   ?Given telephonic nature of communication, physical exam is limited. ?AAOx3. NAD. Normal affect.  Speech and respirations are unlabored. ? ?Accessory Clinical Findings  ?  ?None ? ?Assessment & Plan  ?  ?1.  Preoperative Cardiovascular Risk Assessment: ? ?-The patient affirms he has been doing well without any  new cardiac symptoms. They are able to achieve 4 METS without cardiac limitations. Therefore, based on ACC/AHA guidelines, the patient would be at acceptable risk for the planned procedure without further cardiovascular testing. The patient was advised that if he develops new symptoms prior to surgery to contact our office to arrange for a follow-up visit, and he verbalized understanding.  ? ? ?A copy of this note will be routed to requesting surgeon. ? ?Time:   ?Today, I have spent 7 minutes with the patient with telehealth technology discussing medical history, symptoms, and management plan.   ? ?Mable Fill, Marissa Nestle, NP  ? ?10/07/2021, 1:11 PM ? ?

## 2021-10-11 ENCOUNTER — Ambulatory Visit (INDEPENDENT_AMBULATORY_CARE_PROVIDER_SITE_OTHER): Payer: Medicare Other | Admitting: Nurse Practitioner

## 2021-10-11 ENCOUNTER — Encounter: Payer: Self-pay | Admitting: Nurse Practitioner

## 2021-10-11 DIAGNOSIS — Z01810 Encounter for preprocedural cardiovascular examination: Secondary | ICD-10-CM | POA: Diagnosis not present

## 2021-10-12 DIAGNOSIS — R262 Difficulty in walking, not elsewhere classified: Secondary | ICD-10-CM | POA: Diagnosis not present

## 2021-10-12 DIAGNOSIS — M6281 Muscle weakness (generalized): Secondary | ICD-10-CM | POA: Diagnosis not present

## 2021-10-12 DIAGNOSIS — M1611 Unilateral primary osteoarthritis, right hip: Secondary | ICD-10-CM | POA: Diagnosis not present

## 2021-10-21 DIAGNOSIS — J310 Chronic rhinitis: Secondary | ICD-10-CM | POA: Diagnosis not present

## 2021-10-21 DIAGNOSIS — R0982 Postnasal drip: Secondary | ICD-10-CM | POA: Diagnosis not present

## 2021-10-21 DIAGNOSIS — H6123 Impacted cerumen, bilateral: Secondary | ICD-10-CM | POA: Diagnosis not present

## 2021-10-21 DIAGNOSIS — J343 Hypertrophy of nasal turbinates: Secondary | ICD-10-CM | POA: Diagnosis not present

## 2021-10-22 DIAGNOSIS — F419 Anxiety disorder, unspecified: Secondary | ICD-10-CM | POA: Diagnosis not present

## 2021-10-22 DIAGNOSIS — E782 Mixed hyperlipidemia: Secondary | ICD-10-CM | POA: Diagnosis not present

## 2021-10-22 DIAGNOSIS — E6609 Other obesity due to excess calories: Secondary | ICD-10-CM | POA: Diagnosis not present

## 2021-10-22 DIAGNOSIS — G4739 Other sleep apnea: Secondary | ICD-10-CM | POA: Diagnosis not present

## 2021-10-22 DIAGNOSIS — Z01818 Encounter for other preprocedural examination: Secondary | ICD-10-CM | POA: Diagnosis not present

## 2021-10-22 DIAGNOSIS — Z1331 Encounter for screening for depression: Secondary | ICD-10-CM | POA: Diagnosis not present

## 2021-10-22 DIAGNOSIS — Z6830 Body mass index (BMI) 30.0-30.9, adult: Secondary | ICD-10-CM | POA: Diagnosis not present

## 2021-10-22 DIAGNOSIS — K219 Gastro-esophageal reflux disease without esophagitis: Secondary | ICD-10-CM | POA: Diagnosis not present

## 2021-10-22 DIAGNOSIS — M1991 Primary osteoarthritis, unspecified site: Secondary | ICD-10-CM | POA: Diagnosis not present

## 2021-11-02 ENCOUNTER — Other Ambulatory Visit: Payer: Self-pay | Admitting: Gastroenterology

## 2021-11-02 ENCOUNTER — Ambulatory Visit
Admission: RE | Admit: 2021-11-02 | Discharge: 2021-11-02 | Disposition: A | Discharge status: Home / Self Care | Payer: Medicare Other | Source: Ambulatory Visit | Attending: Gastroenterology | Admitting: Gastroenterology

## 2021-11-02 DIAGNOSIS — R109 Unspecified abdominal pain: Secondary | ICD-10-CM | POA: Diagnosis not present

## 2021-11-02 DIAGNOSIS — K5900 Constipation, unspecified: Secondary | ICD-10-CM | POA: Diagnosis not present

## 2021-11-02 DIAGNOSIS — M4316 Spondylolisthesis, lumbar region: Secondary | ICD-10-CM | POA: Diagnosis not present

## 2021-11-15 DIAGNOSIS — M1611 Unilateral primary osteoarthritis, right hip: Secondary | ICD-10-CM | POA: Diagnosis not present

## 2021-11-15 NOTE — H&P (Signed)
HIP ARTHROPLASTY ADMISSION H&P  Patient ID: Patrick Mathews MRN: [id number] DOB/AGE: Jun 15, 1950 71 y.o.  Chief Complaint: right hip pain.  Planned Procedure Date: 12/07/21 Medical Clearance by Dr. Hilma Favors   Cardiac Clearance by Ambrose Pancoast NP-C   HPI: NYSIR Mathews is a 71 y.o. male who presents for evaluation of OA RIGHT HIP. The patient has a history of pain and functional disability in the right hip due to arthritis and has failed non-surgical conservative treatments for greater than 12 weeks to include NSAID's and/or analgesics, corticosteriod injections, use of assistive devices, and activity modification.  Onset of symptoms was gradual, starting 1 year ago with gradually worsening course since that time. The patient noted no past surgery on the right hip.  Patient currently rates pain at 9 out of 10 with activity. Patient has night pain, worsening of pain with activity and weight bearing, and pain that interferes with activities of daily living.  Patient has evidence of subchondral sclerosis, periarticular osteophytes, and joint space narrowing by imaging studies.  There is no active infection.  Past Medical History:  Diagnosis Date   Adjacent segment disease with spinal stenosis    Arthritis    "knees; back" (07/14/2014)   Asthma    CAD (coronary artery disease)    Chest pain    Exertional dyspnea    GERD (gastroesophageal reflux disease)    Hyperlipidemia    Hypertension    Pneumonia 11/2013   Primary localized osteoarthritis of left knee    Primary localized osteoarthritis of right knee 05/04/2020   Seasonal allergies    Past Surgical History:  Procedure Laterality Date   ANTERIOR CERVICAL DECOMP/DISCECTOMY FUSION  2009   Dr Evlyn Kanner SURGERY     BUNIONECTOMY Right  2013   COLONOSCOPY N/A 11/06/2012   Procedure: COLONOSCOPY;  Surgeon: Jamesetta So, MD;  [facility];  Service: Gastroenterology;  Laterality: N/A;   FOOT SURGERY     JOINT REPLACEMENT      KNEE ARTHROSCOPY Bilateral 40's   POSTERIOR LUMBAR FUSION  1985; 2009   Dr Joya Salm   TONSILLECTOMY  1950's   TOTAL KNEE ARTHROPLASTY Left 07/14/2014   TOTAL KNEE ARTHROPLASTY Left 07/14/2014   Procedure: LEFT TOTAL KNEE ARTHROPLASTY;  Surgeon: [name], MD;  Location: [place];  Service: Orthopedics;  Laterality: Left;   TOTAL KNEE ARTHROPLASTY Right 05/11/2020   Procedure: TOTAL KNEE ARTHROPLASTY;  Surgeon: Elsie Saas, MD;  [facility];  Service: Orthopedics;  Laterality: Right;   WISDOM TOOTH EXTRACTION     Allergies  Allergen Reactions   Morphine And Related Nausea And Vomiting   Sulfa Antibiotics Hives   Prior to Admission medications   Medication Sig Start Date End Date Taking? Authorizing Provider  acetaminophen (TYLENOL) 325 MG tablet Take 650 mg by mouth every 6 (six) hours as needed for mild pain or moderate pain.    [provider]  busPIRone (BUSPAR) 10 MG tablet Take 10 mg by mouth 3 (three) times daily as needed. Patient not taking: Reported on 08/16/2021 04/05/21   [provider]  cyclobenzaprine (FLEXERIL) 10 MG tablet Take 1 tablet (10 mg total) by mouth 3 (three) times daily as needed for muscle spasms. 02/07/20   Newman Pies, MD  dicyclomine (BENTYL) 20 MG tablet     [provider]  docusate sodium (COLACE) 100 MG capsule Take 1 capsule (100 mg total) by mouth 2 (two) times daily. 02/07/20   Newman Pies, MD  gabapentin (NEURONTIN) 600 MG tablet Take 600 mg by mouth at bedtime.    [provider]  Hyoscyamine Sulfate SL (LEVSIN/SL) 0.125 MG SUBL 0.125 to 0.250 SL q4hrs PRN Do not exceed 1.5 mg/day. 06/10/20   Pisciotta, Elmyra Ricks, PA-C  losartan (COZAAR) 25 MG tablet Take 1 tablet (25 mg total) by mouth daily. 08/16/21   Nahser, Wonda Cheng, MD  ondansetron (ZOFRAN) 4 MG tablet Take 1 tablet (4 mg total) by mouth every 8 (eight) hours as needed for nausea or vomiting. 06/10/20   Pisciotta, Elmyra Ricks, PA-C  Peppermint Oil  (IBGARD) 90 MG CPCR Take 2 tablets by mouth daily.    [provider]  rosuvastatin (CRESTOR) 10 MG tablet Take 10 mg by mouth daily. Patient not taking: Reported on 10/04/2021    [provider]  sildenafil (VIAGRA) 100 MG tablet Take 100 mg by mouth daily as needed for erectile dysfunction.  04/21/20   [provider]   Social History   Socioeconomic History   Marital status: Married    Spouse name: Not on file   Number of children: Not on file   Years of education: Not on file   Highest education level: Not on file  Occupational History   Not on file  Tobacco Use   Smoking status: Some Days    Types: Cigars   Smokeless tobacco: Never   Tobacco comments:    07/14/2014 smoked cigs on and off x 40 yrs- none since 2014, but does smoke occ cigar-  Vaping Use   Vaping Use: Never used  Substance and Sexual Activity   Alcohol use: Yes    Alcohol/week: 0.0 standard drinks    Comment: occ   Drug use: No   Sexual activity: Yes  Other Topics Concern   Not on file  Social History Narrative   Not on file   Social Determinants of Health   Financial Resource Strain: Not on file  Food Insecurity: Not on file  Transportation Needs: Not on file  Physical Activity: Not on file  Stress: Not on file  Social Connections: Not on file   Family History  Problem Relation Age of Onset   CAD Mother    CVA Mother    Alzheimer's disease Mother    Dementia Father    Alzheimer's disease Father    Stroke Maternal Grandmother    Heart attack Maternal Grandfather    CAD Maternal Grandfather    Stroke Paternal Grandmother    Colon cancer Neg Hx    Colon polyps Neg Hx     ROS: Currently denies lightheadedness, dizziness, Fever, chills, CP, SOB.   No personal history of DVT, PE, MI, or CVA. No loose teeth or dentures All other systems have been reviewed and were otherwise currently negative with the exception of those mentioned in the HPI and as  above.  Objective: Vitals: Ht: 5'9" Wt: 210 lbs Temp: 97.4 BP: 130/73 Pulse: 60 O2 98% on room air.   Physical Exam: General: Alert, NAD. Trendelenberg Gait  HEENT: EOMI, Good Neck Extension  Pulm: No increased work of breathing.  Clear B/L A/P w/o crackle or wheeze.  CV: RRR, No m/g/r appreciated  GI: soft, NT, ND. BS x 4 quadrants Neuro: CN II-XII grossly intact without focal deficit.  Sensation intact distally Skin: No lesions in the area of chief complaint MSK/Surgical Site: non- TTP. Decreased ROM d/t pain . + Stinchfield. - SLR. + FABER/FADIR. 5/5 strength.  NVI.    Imaging Review Plain radiographs  demonstrate moderate degenerative joint disease of the right hip.   The bone quality appears to be fair for age and reported activity level.  Preoperative templating of the joint replacement has been completed, documented, and submitted to the Operating Room personnel in order to optimize intra-operative equipment management.  Assessment: OA RIGHT HIP Active Problems:   * No active hospital problems. *   Plan: Plan for Procedure(s): TOTAL HIP ARTHROPLASTY ANTERIOR APPROACH  The patient history, physical exam, clinical judgement of the provider and imaging are consistent with end stage degenerative joint disease and total joint arthroplasty is deemed medically necessary. The treatment options including medical management, injection therapy, and arthroplasty were discussed at length. The risks and benefits of Procedure(s): TOTAL HIP ARTHROPLASTY ANTERIOR APPROACH were presented and reviewed.  The risks of nonoperative treatment, versus surgical intervention including but not limited to continued pain, aseptic loosening, stiffness, dislocation/subluxation, infection, bleeding, nerve injury, blood clots, cardiopulmonary complications, morbidity, mortality, among others were discussed. The patient verbalizes understanding and wishes to proceed with the plan.  Patient is being admitted  for surgery, pain control, PT, prophylactic antibiotics, VTE prophylaxis, progressive ambulation, ADL's and discharge planning. He will possibly spend the night in observation.   Dental prophylaxis discussed and recommended for 2 years postoperatively.  The patient does meet the criteria for TXA which will be used perioperatively.   ASA 81 mg BID will be used postoperatively for DVT prophylaxis in addition to SCDs, and early ambulation. Plan for Tramadol, Mobic, Tylenol for pain.   Robaxin or muscle spasm.  Zofran for nausea and vomiting. Already has enough at home.  Miralax for constipation prevention. Already had enough at home. Pharmacy- [place] The patient is planning to be discharged home with OPPT and into the care of his wife Neoma Laming who can be reached at [phone] Follow up appt 12/22/21 at 4:15pm     Alisa Graff  Office [phone] 11/15/2021 4:16 PM

## 2021-11-22 NOTE — Patient Instructions (Addendum)
DUE TO COVID-19 ONLY TWO VISITORS  (aged 71 and older)  ARE ALLOWED TO COME WITH YOU AND STAY IN THE WAITING ROOM ONLY DURING PRE OP AND PROCEDURE.   **NO VISITORS ARE ALLOWED IN THE SHORT STAY AREA OR RECOVERY ROOM!!**  IF YOU WILL BE ADMITTED INTO THE HOSPITAL YOU ARE ALLOWED ONLY FOUR SUPPORT PEOPLE DURING VISITATION HOURS ONLY (7 AM -8PM)   The support person(s) must pass our screening, gel in and out, and wear a mask at all times, including in the patient's room. Patients must also wear a mask when staff or their support person are in the room. Visitors GUEST BADGE MUST BE WORN VISIBLY  One adult visitor may remain with you overnight and MUST be in the room by 8 P.M.     Your procedure is scheduled on: 12/07/21   Report to [facility] Main Entrance    Report to admitting at  9:50 AM   Call this number if you have problems the morning of surgery [phone]   Do not eat food :After Midnight.   After Midnight you may have the following liquids until _9:30 _ AM/  DAY OF SURGERY  Water Black Coffee (sugar ok, NO MILK/CREAM OR CREAMERS)  Tea (sugar ok, NO MILK/CREAM OR CREAMERS) regular and decaf                             Plain Jell-O (NO RED)                                           Fruit ices (not with fruit pulp, NO RED)                                     Popsicles (NO RED)                                                                  Juice: apple, WHITE grape, WHITE cranberry Sports drinks like Gatorade (NO RED) Clear broth(vegetable,chicken,beef)      The day of surgery:  Drink ONE (1) Pre-Surgery Clear Ensure  at 9:15 AM the morning of surgery. Drink in one sitting. Do not sip.  This drink was given to you during your hospital  pre-op appointment visit. Nothing else to drink after completing the  Pre-Surgery Clear Ensure at 9:30 AM          If you have questions, please contact your surgeon's office.       Oral Hygiene is also important to reduce  your risk of infection.                                    Remember - BRUSH YOUR TEETH THE MORNING OF SURGERY WITH YOUR REGULAR TOOTHPASTE    Take these medicines the morning of surgery with A SIP OF WATER: Pantoprazole  No smoking after midnight  You may not have any metal on your body including  jewelry, and body piercing             Do not wear lotions, powders, perfumes/cologne, or deodorant               Men may shave face and neck.   Do not bring valuables to the hospital. [place].   Contacts, dentures or bridgework may not be worn into surgery.   Bring small overnight bag day of surgery.   DO NOT [place]. PHARMACY WILL DISPENSE MEDICATIONS LISTED ON YOUR MEDICATION LIST TO YOU DURING YOUR ADMISSION [place]!     Special Instructions: Bring a copy of your healthcare power of attorney and living will documents  the day of surgery if you haven't scanned them before.              Please read over the following fact sheets you were given: IF YOU HAVE QUESTIONS ABOUT YOUR PRE-OP INSTRUCTIONS PLEASE CALL [phone]     [facility] - Preparing for Surgery Before surgery, you can play an important role.  Because skin is not sterile, your skin needs to be as free of germs as possible.  You can reduce the number of germs on your skin by washing with CHG (chlorahexidine gluconate) soap before surgery.  CHG is an antiseptic cleaner which kills germs and bonds with the skin to continue killing germs even after washing. Please DO NOT use if you have an allergy to CHG or antibacterial soaps.  If your skin becomes reddened/irritated stop using the CHG and inform your nurse when you arrive at Short Stay.  You may shave your face/neck. Please follow these instructions carefully:  1.  Shower with CHG Soap the night before surgery and the  morning of Surgery.  2.  If you  choose to wash your hair, wash your hair first as usual with your  normal  shampoo.  3.  After you shampoo, rinse your hair and body thoroughly to remove the  shampoo.                            4.  Use CHG as you would any other liquid soap.  You can apply chg directly  to the skin and wash                       Gently with a scrungie or clean washcloth.  5.  Apply the CHG Soap to your body ONLY FROM THE NECK DOWN.   Do not use on face/ open                           Wound or open sores. Avoid contact with eyes, ears mouth and genitals (private parts).                       Wash face,  Genitals (private parts) with your normal soap.             6.  Wash thoroughly, paying special attention to the area where your surgery  will be performed.  7.  Thoroughly rinse your body with warm water from the neck down.  8.  DO NOT shower/wash with  your normal soap after using and rinsing off  the CHG Soap.                9.  Pat yourself dry with a clean towel.            10.  Wear clean pajamas.            11.  Place clean sheets on your bed the night of your first shower and do not  sleep with pets. Day of Surgery : Do not apply any lotions/deodorants the morning of surgery.  Please wear clean clothes to the hospital/surgery center.  FAILURE TO FOLLOW THESE INSTRUCTIONS MAY RESULT IN THE CANCELLATION OF YOUR SURGERY  ________________________________________________________________________   Incentive Spirometer  An incentive spirometer is a tool that can help keep your lungs clear and active. This tool measures how well you are filling your lungs with each breath. Taking long deep breaths may help reverse or decrease the chance of developing breathing (pulmonary) problems (especially infection) following: A long period of time when you are unable to move or be active. BEFORE THE PROCEDURE  If the spirometer includes an indicator to show your best effort, your nurse or respiratory therapist will set it  to a desired goal. If possible, sit up straight or lean slightly forward. Try not to slouch. Hold the incentive spirometer in an upright position. INSTRUCTIONS FOR USE  Sit on the edge of your bed if possible, or sit up as far as you can in bed or on a chair. Hold the incentive spirometer in an upright position. Breathe out normally. Place the mouthpiece in your mouth and seal your lips tightly around it. Breathe in slowly and as deeply as possible, raising the piston or the ball toward the top of the column. Hold your breath for 3-5 seconds or for as long as possible. Allow the piston or ball to fall to the bottom of the column. Remove the mouthpiece from your mouth and breathe out normally. Rest for a few seconds and repeat Steps 1 through 7 at least 10 times every 1-2 hours when you are awake. Take your time and take a few normal breaths between deep breaths. The spirometer may include an indicator to show your best effort. Use the indicator as a goal to work toward during each repetition. After each set of 10 deep breaths, practice coughing to be sure your lungs are clear. If you have an incision (the cut made at the time of surgery), support your incision when coughing by placing a pillow or rolled up towels firmly against it. Once you are able to get out of bed, walk around indoors and cough well. You may stop using the incentive spirometer when instructed by your caregiver.  RISKS AND COMPLICATIONS Take your time so you do not get dizzy or light-headed. If you are in pain, you may need to take or ask for pain medication before doing incentive spirometry. It is harder to take a deep breath if you are having pain. AFTER USE Rest and breathe slowly and easily. It can be helpful to keep track of a log of your progress. Your caregiver can provide you with a simple table to help with this. If you are using the spirometer at home, follow these instructions: [place] IF:  You are  having difficultly using the spirometer. You have trouble using the spirometer as often as instructed. Your pain medication is not giving enough relief while using the spirometer. You  develop fever of 100.5 F (38.1 C) or higher. SEEK IMMEDIATE MEDICAL CARE IF:  You cough up bloody sputum that had not been present before. You develop fever of 102 F (38.9 C) or greater. You develop worsening pain at or near the incision site. MAKE SURE YOU:  Understand these instructions. Will watch your condition. Will get help right away if you are not doing well or get worse. Document Released: 10/10/2006 Document Revised: 08/22/2011 Document Reviewed: 12/11/2006 [facility] Patient Information 2014 [place], [place].   ________________________________________________________________________

## 2021-11-24 ENCOUNTER — Encounter (HOSPITAL_COMMUNITY): Payer: Self-pay

## 2021-11-24 ENCOUNTER — Encounter (HOSPITAL_COMMUNITY)
Admission: RE | Admit: 2021-11-24 | Discharge: 2021-11-24 | Disposition: A | Discharge status: Home / Self Care | Payer: Medicare Other | Source: Ambulatory Visit | Attending: Orthopedic Surgery | Admitting: Orthopedic Surgery

## 2021-11-24 ENCOUNTER — Other Ambulatory Visit: Payer: Self-pay

## 2021-11-24 DIAGNOSIS — Z87891 Personal history of nicotine dependence: Secondary | ICD-10-CM | POA: Insufficient documentation

## 2021-11-24 DIAGNOSIS — M1611 Unilateral primary osteoarthritis, right hip: Secondary | ICD-10-CM | POA: Diagnosis not present

## 2021-11-24 DIAGNOSIS — Z01812 Encounter for preprocedural laboratory examination: Secondary | ICD-10-CM | POA: Insufficient documentation

## 2021-11-24 DIAGNOSIS — J45909 Unspecified asthma, uncomplicated: Secondary | ICD-10-CM | POA: Insufficient documentation

## 2021-11-24 DIAGNOSIS — I10 Essential (primary) hypertension: Secondary | ICD-10-CM | POA: Insufficient documentation

## 2021-11-24 DIAGNOSIS — K219 Gastro-esophageal reflux disease without esophagitis: Secondary | ICD-10-CM | POA: Insufficient documentation

## 2021-11-24 DIAGNOSIS — I2510 Atherosclerotic heart disease of native coronary artery without angina pectoris: Secondary | ICD-10-CM | POA: Diagnosis not present

## 2021-11-24 DIAGNOSIS — Z01818 Encounter for other preprocedural examination: Secondary | ICD-10-CM

## 2021-11-24 LAB — CBC
HCT: 45.6 % (ref 39.0–52.0)
Hemoglobin: 15.3 g/dL (ref 13.0–17.0)
MCH: 31.4 pg (ref 26.0–34.0)
MCHC: 33.6 g/dL (ref 30.0–36.0)
MCV: 93.4 fL (ref 80.0–100.0)
Platelets: 192 10*3/uL (ref 150–400)
RBC: 4.88 MIL/uL (ref 4.22–5.81)
RDW: 13.1 % (ref 11.5–15.5)
WBC: 5 10*3/uL (ref 4.0–10.5)
nRBC: 0 % (ref 0.0–0.2)

## 2021-11-24 LAB — SURGICAL PCR SCREEN
MRSA, PCR: NEGATIVE
Staphylococcus aureus: NEGATIVE

## 2021-11-24 LAB — BASIC METABOLIC PANEL
Anion gap: 6 (ref 5–15)
BUN: 13 mg/dL (ref 8–23)
CO2: 27 mmol/L (ref 22–32)
Calcium: 9.7 mg/dL (ref 8.9–10.3)
Chloride: 108 mmol/L (ref 98–111)
Creatinine, Ser: 1.15 mg/dL (ref 0.61–1.24)
GFR, Estimated: >60 mL/min (ref >60)
Glucose, Bld: 108 mg/dL — ABNORMAL HIGH (ref 70–99)
Potassium: 4.7 mmol/L (ref 3.5–5.1)
Sodium: 141 mmol/L (ref 135–145)

## 2021-11-24 LAB — TYPE AND SCREEN
ABO/RH(D): O POS
Antibody Screen: NEGATIVE

## 2021-11-24 NOTE — Progress Notes (Signed)
Anesthesia note:  Bowel prep reminder:NA  PCP - Dr. Eddie Candle Cardiologist -Dr. Joaquim Nam Other-   Chest x-ray - no EKG - 05/11/21-epic Stress Test - 07/22/19-epic ECHO - 05/25/21-epic Cardiac Cath -  Ct Coronary 05/24/21-epic Pacemaker/ICD device last checked:NA  Sleep Study - yes CPAP - no  Pt is pre diabetic-NA Fasting Blood Sugar -  Checks Blood Sugar _____  Blood Thinner:NA Blood Thinner Instructions: Aspirin Instructions: Last Dose:  Anesthesia review: yes  Patient denies shortness of breath, fever, cough and chest pain at PAT appointment Pt reports no SOB with activities  Patient verbalized understanding of instructions that were given to them at the PAT appointment. Patient was also instructed that they will need to review over the PAT instructions again at home before surgery. yes

## 2021-11-25 NOTE — Progress Notes (Signed)
Anesthesia Chart Review   Case: [id number] Date/Time: 12/07/21 1220   Procedure: TOTAL HIP ARTHROPLASTY ANTERIOR APPROACH (Right: Hip)   Anesthesia type: Choice   Pre-op diagnosis: OA RIGHT HIP   Location: [facility]OM 08 / [facility]   Surgeons: Renette Butters, MD       DISCUSSION:71 y.o. former smoker with h/o GERD, HTN, asthma, CAD, right hip OA scheduled for above procedure 12/07/2021 with Dr. Edmonia Lynch.   Pt last seen by cardiology 10/11/2021. Per OV note, "The patient affirms he has been doing well without any new cardiac symptoms. They are able to achieve 4 METS without cardiac limitations. Therefore, based on ACC/AHA guidelines, the patient would be at acceptable risk for the planned procedure without further cardiovascular testing. The patient was advised that if he develops new symptoms prior to surgery to contact our office to arrange for a follow-up visit, and he verbalized understanding."  Anticipate pt can proceed with planned procedure barring acute status change.    VS: BP 134/73   Pulse (!) 59   Temp 37.1 C   Resp 20   Ht '5\' 10"'$  (0.177 m)   Wt 93.9 kg   SpO2 100%   BMI 29.70 kg/m   PROVIDERS: Sharilyn Sites, MD is PCP    LABS: Labs reviewed: Acceptable for surgery. (all labs ordered are listed, but only abnormal results are displayed)  Labs Reviewed  BASIC METABOLIC PANEL - Abnormal; Notable for the following components:      Result Value   Glucose, Bld 108 (*)    All other components within normal limits  SURGICAL PCR SCREEN  CBC  TYPE AND SCREEN     IMAGES:   EKG: 05/11/2021 Rate 52 bpm  Sinus bradycardia  RBBB LAFB  CV: Echo 05/25/2021 1. Left ventricular ejection fraction, by estimation, is 55 to 60%. The  left ventricle has normal function. The left ventricle has no regional  wall motion abnormalities. The left ventricular internal cavity size was  mildly dilated. Left ventricular  diastolic parameters were normal. The average left  ventricular global  longitudinal strain is -17.9 %. The global longitudinal strain is normal.   2. Right ventricular systolic function is normal. The right ventricular  size is normal.   3. Left atrial size was mildly dilated.   4. The mitral valve is abnormal. Trivial mitral valve regurgitation. No  evidence of mitral stenosis.   5. The aortic valve is tricuspid. There is mild calcification of the  aortic valve. Aortic valve regurgitation is mild to moderate. Aortic valve  sclerosis/calcification is present, without any evidence of aortic  stenosis.   6. Aortic dilatation noted. There is mild dilatation of the aortic root,  measuring 38 mm. There is mild dilatation of the ascending aorta,  measuring 41 mm.   7. The inferior vena cava is normal in size with greater than 50%  respiratory variability, suggesting right atrial pressure of 3 mmHg.   Myocardial Perfusion 07/22/2019 Nuclear stress EF: 70%. There was no ST segment deviation noted during stress. The study is normal. This is a low risk study. The left ventricular ejection fraction is hyperdynamic (>65%).   Normal resting and stress perfusion. No ischemia or infarction EF 70% Baseline ECG with RBBB Past Medical History:  Diagnosis Date   Adjacent segment disease with spinal stenosis    Arthritis    "knees; back" (07/14/2014)   Asthma    CAD (coronary artery disease)    Exertional dyspnea    GERD (  gastroesophageal reflux disease)    Hyperlipidemia    Hypertension    Primary localized osteoarthritis of left knee    Primary localized osteoarthritis of right knee 05/04/2020   Seasonal allergies     Past Surgical History:  Procedure Laterality Date   ANTERIOR CERVICAL DECOMP/DISCECTOMY FUSION  1999   Dr Evlyn Kanner SURGERY     BUNIONECTOMY Right 2013   COLONOSCOPY N/A 11/06/2012   Procedure: COLONOSCOPY;  Surgeon: Jamesetta So, MD;  [facility];  Service: Gastroenterology;  Laterality: N/A;   FOOT  SURGERY Right 2013   bunionectomy   KNEE ARTHROSCOPY Bilateral 1990's   POSTERIOR LUMBAR FUSION  1985; 2009   Dr Joya Salm   TONSILLECTOMY  1950's   TOTAL KNEE ARTHROPLASTY Left 07/14/2014   Procedure: LEFT TOTAL KNEE ARTHROPLASTY;  Surgeon: [name], MD;  Location: [place];  Service: Orthopedics;  Laterality: Left;   TOTAL KNEE ARTHROPLASTY Right 05/11/2020   Procedure: TOTAL KNEE ARTHROPLASTY;  Surgeon: Elsie Saas, MD;  Location: [facility];  Service: Orthopedics;  Laterality: Right;   WISDOM TOOTH EXTRACTION      MEDICATIONS:  acetaminophen (TYLENOL) 325 MG tablet   cholecalciferol (VITAMIN D3) 25 MCG (1000 UNIT) tablet   dicyclomine (BENTYL) 20 MG tablet   docusate sodium (COLACE) 100 MG capsule   gabapentin (NEURONTIN) 600 MG tablet   losartan (COZAAR) 25 MG tablet   meloxicam (MOBIC) 15 MG tablet   ondansetron (ZOFRAN) 8 MG tablet   OVER THE COUNTER MEDICATION   pantoprazole (PROTONIX) 40 MG tablet   Peppermint Oil (IBGARD) 90 MG CPCR   polyethylene glycol (MIRALAX / GLYCOLAX) 17 g packet   sildenafil (VIAGRA) 100 MG tablet   No current facility-administered medications for this encounter.    Konrad Felix Ward, PA-C [facility] [phone]

## 2021-11-30 NOTE — Care Plan (Signed)
Ortho Bundle Case Management Note  Patient Details  Name: Patrick Mathews MRN: [id number] Date of Birth: 05/22/1951    Met with patient and wife in the office prior to surgery. HE will discharge to home with her assistance. Has rolling walker. OPPT set up with [facility]. Patient and MD in agreement with plan. Choice offered                  DME Arranged:    DME Agency:     HH Arranged:    HH Agency:     Additional Comments: Please contact me with any questions of if this plan should need to change.  Renee Angiulli,  RN,BSN,MHA,CCM  [facility]  [phone] 11/30/2021, 5:52 PM   

## 2021-12-04 IMAGING — NM NM HEPATO W/GB/PHARM/[PERSON_NAME]
2 series · 12 of 12 positions shown · non-contrast
Comparison: August 27, 2020

CLINICAL DATA: Right upper quadrant abdominal pain.

EXAM:
NUCLEAR MEDICINE HEPATOBILIARY IMAGING WITH GALLBLADDER EF
TECHNIQUE: Sequential images of the abdomen were obtained [DATE] minutes
following intravenous administration of radiopharmaceutical. After
oral ingestion of Ensure, gallbladder ejection fraction was
determined. At 60 min, normal ejection fraction is greater than 33%.
RADIOPHARMACEUTICALS:  5.25 mCi 8c-99m  Choletec IV

[Series 1: biliary · 4.14mm/px · 6 of 60 frames shown]
[frame 6/60]
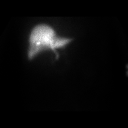
[frame 16/60]
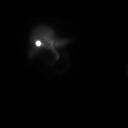
[frame 26/60]
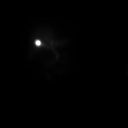
[frame 36/60]
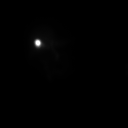
[frame 46/60]
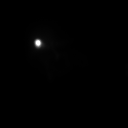
[frame 56/60]
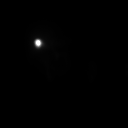

[Series 2: gbef · 4.14mm/px · 6 of 60 frames shown]
[frame 6/60]
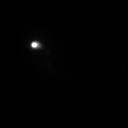
[frame 16/60]
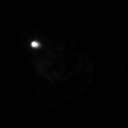
[frame 26/60]
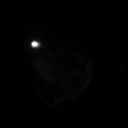
[frame 36/60]
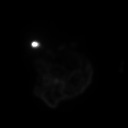
[frame 46/60]
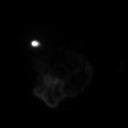
[frame 56/60]
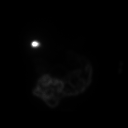

[12 of 12 positions shown; findings below may reference images not displayed]

FINDINGS: There is prompt, uniform radiotracer uptake by the liver with normal
filling of the intrahepatic ducts, common bile duct. Gallbladder
activity is visualized, consistent with patency of cystic duct
(normal < 60 minutes). Additionally there is normal biliary to bowel
transit (normal < 60 minutes), consistent with patent common bile
duct.

Ensure was administered and the gallbladder appears to empty
normally on sequential images. Calculated gallbladder ejection
fraction is 59%. (Normal gallbladder ejection fraction with Ensure
is greater than 33%.)

Enterogastric biliary reflux visualized post ingestion of Ensure.
IMPRESSION: 1.  Patent cystic and common bile ducts.

2.  Normal gallbladder ejection fraction.

3. Enterogastric biliary reflux visualized post ingestion of Ensure
which can be a cause of abdominal pain.

## 2021-12-07 ENCOUNTER — Encounter (HOSPITAL_COMMUNITY): Payer: Self-pay | Admitting: Orthopedic Surgery

## 2021-12-07 ENCOUNTER — Other Ambulatory Visit: Payer: Self-pay

## 2021-12-07 ENCOUNTER — Ambulatory Visit (HOSPITAL_COMMUNITY): Payer: Medicare Other

## 2021-12-07 ENCOUNTER — Encounter (HOSPITAL_COMMUNITY)
Admission: RE | Disposition: A | Discharge status: Home / Self Care | Payer: Self-pay | Source: Home / Self Care | Attending: Orthopedic Surgery

## 2021-12-07 ENCOUNTER — Ambulatory Visit (HOSPITAL_COMMUNITY): Payer: Medicare Other | Admitting: Physician Assistant

## 2021-12-07 ENCOUNTER — Ambulatory Visit (HOSPITAL_BASED_OUTPATIENT_CLINIC_OR_DEPARTMENT_OTHER): Payer: Medicare Other | Admitting: Anesthesiology

## 2021-12-07 ENCOUNTER — Observation Stay (HOSPITAL_COMMUNITY)
Admission: RE | Admit: 2021-12-07 | Discharge: 2021-12-08 | Disposition: A | Discharge status: Home / Self Care | Payer: Medicare Other | Attending: Orthopedic Surgery | Admitting: Orthopedic Surgery

## 2021-12-07 DIAGNOSIS — M1611 Unilateral primary osteoarthritis, right hip: Principal | ICD-10-CM | POA: Insufficient documentation

## 2021-12-07 DIAGNOSIS — Z79899 Other long term (current) drug therapy: Secondary | ICD-10-CM | POA: Diagnosis not present

## 2021-12-07 DIAGNOSIS — I2510 Atherosclerotic heart disease of native coronary artery without angina pectoris: Secondary | ICD-10-CM | POA: Diagnosis not present

## 2021-12-07 DIAGNOSIS — I10 Essential (primary) hypertension: Secondary | ICD-10-CM | POA: Insufficient documentation

## 2021-12-07 DIAGNOSIS — J45909 Unspecified asthma, uncomplicated: Secondary | ICD-10-CM | POA: Insufficient documentation

## 2021-12-07 DIAGNOSIS — F17290 Nicotine dependence, other tobacco product, uncomplicated: Secondary | ICD-10-CM | POA: Diagnosis not present

## 2021-12-07 DIAGNOSIS — Z96653 Presence of artificial knee joint, bilateral: Secondary | ICD-10-CM | POA: Insufficient documentation

## 2021-12-07 DIAGNOSIS — Z0389 Encounter for observation for other suspected diseases and conditions ruled out: Secondary | ICD-10-CM | POA: Diagnosis not present

## 2021-12-07 DIAGNOSIS — Z96641 Presence of right artificial hip joint: Secondary | ICD-10-CM | POA: Diagnosis present

## 2021-12-07 DIAGNOSIS — Z01818 Encounter for other preprocedural examination: Secondary | ICD-10-CM

## 2021-12-07 HISTORY — PX: TOTAL HIP ARTHROPLASTY: SHX124

## 2021-12-07 SURGERY — ARTHROPLASTY, HIP, TOTAL, ANTERIOR APPROACH
Anesthesia: Spinal | Site: Hip | Laterality: Right

## 2021-12-07 MED ORDER — TRAMADOL HCL 50 MG PO TABS
50.0000 mg | ORAL_TABLET | Freq: Four times a day (QID) | ORAL | Status: DC
  Administered 2021-12-07 – 2021-12-08 (×4): 50 mg via ORAL
  Filled 2021-12-07 (×4): qty 1

## 2021-12-07 MED ORDER — CEFAZOLIN SODIUM-DEXTROSE 2-4 GM/100ML-% IV SOLN
2.0000 g | Freq: Four times a day (QID) | INTRAVENOUS | Status: AC
  Administered 2021-12-07 – 2021-12-08 (×2): 2 g via INTRAVENOUS
  Filled 2021-12-07 (×2): qty 100

## 2021-12-07 MED ORDER — MEPERIDINE HCL 50 MG/ML IJ SOLN: 6.2500 mg | INTRAMUSCULAR | Status: DC | PRN

## 2021-12-07 MED ORDER — ACETAMINOPHEN 500 MG PO TABS
1000.0000 mg | ORAL_TABLET | Freq: Once | ORAL | Status: AC
  Administered 2021-12-07: 1000 mg via ORAL
  Filled 2021-12-07: qty 2

## 2021-12-07 MED ORDER — BISACODYL 10 MG RE SUPP: 10.0000 mg | Freq: Every day | RECTAL | Status: DC | PRN

## 2021-12-07 MED ORDER — HYDROCODONE-ACETAMINOPHEN 5-325 MG PO TABS
1.0000 | ORAL_TABLET | ORAL | Status: DC | PRN
  Administered 2021-12-07: 2 via ORAL
  Administered 2021-12-08: 1 via ORAL
  Filled 2021-12-07 (×2): qty 2

## 2021-12-07 MED ORDER — SODIUM CHLORIDE (PF) 0.9 % IJ SOLN
INTRAMUSCULAR | Status: AC
  Filled 2021-12-07: qty 10

## 2021-12-07 MED ORDER — FENTANYL CITRATE (PF) 100 MCG/2ML IJ SOLN
INTRAMUSCULAR | Status: DC | PRN
  Administered 2021-12-07 (×2): 50 ug via INTRAVENOUS

## 2021-12-07 MED ORDER — DIPHENHYDRAMINE HCL 12.5 MG/5ML PO ELIX
12.5000 mg | ORAL_SOLUTION | ORAL | Status: DC | PRN
  Administered 2021-12-08: 25 mg via ORAL
  Filled 2021-12-07: qty 10

## 2021-12-07 MED ORDER — MENTHOL 3 MG MT LOZG
1.0000 | LOZENGE | OROMUCOSAL | Status: DC | PRN
Start: 2021-12-07 — End: 2021-12-08

## 2021-12-07 MED ORDER — ORAL CARE MOUTH RINSE: 15.0000 mL | Freq: Once | OROMUCOSAL | Status: AC

## 2021-12-07 MED ORDER — TRANEXAMIC ACID-NACL 1000-0.7 MG/100ML-% IV SOLN
1000.0000 mg | Freq: Once | INTRAVENOUS | Status: AC
  Administered 2021-12-07: 1000 mg via INTRAVENOUS
  Filled 2021-12-07: qty 100

## 2021-12-07 MED ORDER — GABAPENTIN 300 MG PO CAPS
600.0000 mg | ORAL_CAPSULE | Freq: Every day | ORAL | Status: DC
  Administered 2021-12-07: 600 mg via ORAL
  Filled 2021-12-07: qty 2

## 2021-12-07 MED ORDER — BUPIVACAINE LIPOSOME 1.3 % IJ SUSP
INTRAMUSCULAR | Status: DC | PRN
  Administered 2021-12-07: 10 mL

## 2021-12-07 MED ORDER — HYDROMORPHONE HCL 1 MG/ML IJ SOLN: 0.2500 mg | INTRAMUSCULAR | Status: DC | PRN

## 2021-12-07 MED ORDER — ALUM & MAG HYDROXIDE-SIMETH 200-200-20 MG/5ML PO SUSP
30.0000 mL | ORAL | Status: DC | PRN
Start: 2021-12-07 — End: 2021-12-08

## 2021-12-07 MED ORDER — PANTOPRAZOLE SODIUM 40 MG PO TBEC
40.0000 mg | DELAYED_RELEASE_TABLET | Freq: Two times a day (BID) | ORAL | Status: DC
  Administered 2021-12-07 – 2021-12-08 (×2): 40 mg via ORAL
  Filled 2021-12-07 (×2): qty 1

## 2021-12-07 MED ORDER — BUPIVACAINE LIPOSOME 1.3 % IJ SUSP
INTRAMUSCULAR | Status: AC
Start: 2021-12-07 — End: ?
  Filled 2021-12-07: qty 10

## 2021-12-07 MED ORDER — CEFAZOLIN SODIUM-DEXTROSE 2-4 GM/100ML-% IV SOLN
2.0000 g | INTRAVENOUS | Status: AC
  Administered 2021-12-07: 2 g via INTRAVENOUS
  Filled 2021-12-07: qty 100

## 2021-12-07 MED ORDER — PROPOFOL 500 MG/50ML IV EMUL
INTRAVENOUS | Status: AC
  Filled 2021-12-07: qty 50

## 2021-12-07 MED ORDER — WATER FOR IRRIGATION, STERILE IR SOLN
Status: DC | PRN
  Administered 2021-12-07: 2000 mL

## 2021-12-07 MED ORDER — METHOCARBAMOL 500 MG PO TABS: 500.0000 mg | ORAL_TABLET | Freq: Four times a day (QID) | ORAL | Status: DC | PRN

## 2021-12-07 MED ORDER — CHLORHEXIDINE GLUCONATE 0.12 % MT SOLN
15.0000 mL | Freq: Once | OROMUCOSAL | Status: AC
  Administered 2021-12-07: 15 mL via OROMUCOSAL

## 2021-12-07 MED ORDER — BUPIVACAINE IN DEXTROSE 0.75-8.25 % IT SOLN
INTRATHECAL | Status: DC | PRN
  Administered 2021-12-07: 1.8 mL via INTRATHECAL

## 2021-12-07 MED ORDER — PHENYLEPHRINE HCL-NACL 20-0.9 MG/250ML-% IV SOLN
INTRAVENOUS | Status: DC | PRN
  Administered 2021-12-07: 50 ug/min via INTRAVENOUS

## 2021-12-07 MED ORDER — GABAPENTIN 600 MG PO TABS
600.0000 mg | ORAL_TABLET | Freq: Every day | ORAL | Status: DC
  Filled 2021-12-07: qty 1

## 2021-12-07 MED ORDER — GLYCOPYRROLATE 0.2 MG/ML IJ SOLN
INTRAMUSCULAR | Status: AC
  Filled 2021-12-07: qty 1

## 2021-12-07 MED ORDER — POLYETHYLENE GLYCOL 3350 17 G PO PACK
17.0000 g | PACK | Freq: Every day | ORAL | Status: DC | PRN
Start: 2021-12-07 — End: 2021-12-08

## 2021-12-07 MED ORDER — METOCLOPRAMIDE HCL 5 MG PO TABS: 5.0000 mg | ORAL_TABLET | Freq: Three times a day (TID) | ORAL | Status: DC | PRN

## 2021-12-07 MED ORDER — METOCLOPRAMIDE HCL 5 MG/ML IJ SOLN: 5.0000 mg | Freq: Three times a day (TID) | INTRAMUSCULAR | Status: DC | PRN

## 2021-12-07 MED ORDER — DEXAMETHASONE SODIUM PHOSPHATE 10 MG/ML IJ SOLN
10.0000 mg | Freq: Once | INTRAMUSCULAR | Status: AC
  Administered 2021-12-08: 10 mg via INTRAVENOUS
  Filled 2021-12-07: qty 1

## 2021-12-07 MED ORDER — HYDROCODONE-ACETAMINOPHEN 7.5-325 MG PO TABS: 1.0000 | ORAL_TABLET | ORAL | Status: DC | PRN

## 2021-12-07 MED ORDER — METHOCARBAMOL 500 MG IVPB - SIMPLE MED: 500.0000 mg | Freq: Four times a day (QID) | INTRAVENOUS | Status: DC | PRN

## 2021-12-07 MED ORDER — ONDANSETRON HCL 4 MG/2ML IJ SOLN: 4.0000 mg | Freq: Once | INTRAMUSCULAR | Status: DC | PRN

## 2021-12-07 MED ORDER — SODIUM CHLORIDE FLUSH 0.9 % IV SOLN
INTRAVENOUS | Status: DC | PRN
  Administered 2021-12-07: 10 mL

## 2021-12-07 MED ORDER — ONDANSETRON HCL 4 MG PO TABS: 4.0000 mg | ORAL_TABLET | Freq: Four times a day (QID) | ORAL | Status: DC | PRN

## 2021-12-07 MED ORDER — ONDANSETRON HCL 4 MG/2ML IJ SOLN
INTRAMUSCULAR | Status: DC | PRN
  Administered 2021-12-07: 4 mg via INTRAVENOUS

## 2021-12-07 MED ORDER — FENTANYL CITRATE (PF) 100 MCG/2ML IJ SOLN
INTRAMUSCULAR | Status: AC
  Filled 2021-12-07: qty 2

## 2021-12-07 MED ORDER — DEXAMETHASONE SODIUM PHOSPHATE 10 MG/ML IJ SOLN
8.0000 mg | Freq: Once | INTRAMUSCULAR | Status: AC
  Administered 2021-12-07: 8 mg via INTRAVENOUS

## 2021-12-07 MED ORDER — TRANEXAMIC ACID-NACL 1000-0.7 MG/100ML-% IV SOLN
1000.0000 mg | INTRAVENOUS | Status: AC
  Administered 2021-12-07: 1000 mg via INTRAVENOUS
  Filled 2021-12-07: qty 100

## 2021-12-07 MED ORDER — 0.9 % SODIUM CHLORIDE (POUR BTL) OPTIME
TOPICAL | Status: DC | PRN
  Administered 2021-12-07: 1000 mL

## 2021-12-07 MED ORDER — PROPOFOL 1000 MG/100ML IV EMUL
INTRAVENOUS | Status: AC
  Filled 2021-12-07: qty 100

## 2021-12-07 MED ORDER — EPHEDRINE SULFATE-NACL 50-0.9 MG/10ML-% IV SOSY
PREFILLED_SYRINGE | INTRAVENOUS | Status: DC | PRN
  Administered 2021-12-07 (×2): 10 mg via INTRAVENOUS

## 2021-12-07 MED ORDER — GLYCOPYRROLATE 0.2 MG/ML IJ SOLN
INTRAMUSCULAR | Status: DC | PRN
  Administered 2021-12-07: .2 mg via INTRAVENOUS

## 2021-12-07 MED ORDER — PHENOL 1.4 % MT LIQD: 1.0000 | OROMUCOSAL | Status: DC | PRN

## 2021-12-07 MED ORDER — LACTATED RINGERS IV SOLN: INTRAVENOUS | Status: DC

## 2021-12-07 MED ORDER — PROPOFOL 10 MG/ML IV BOLUS
INTRAVENOUS | Status: DC | PRN
  Administered 2021-12-07 (×2): 20 mg via INTRAVENOUS

## 2021-12-07 MED ORDER — PROPOFOL 500 MG/50ML IV EMUL
INTRAVENOUS | Status: DC | PRN
  Administered 2021-12-07: 130 ug/kg/min via INTRAVENOUS

## 2021-12-07 MED ORDER — LOSARTAN POTASSIUM 25 MG PO TABS
25.0000 mg | ORAL_TABLET | Freq: Every day | ORAL | Status: DC
  Administered 2021-12-07 – 2021-12-08 (×2): 25 mg via ORAL
  Filled 2021-12-07 (×2): qty 1

## 2021-12-07 MED ORDER — DOCUSATE SODIUM 100 MG PO CAPS
100.0000 mg | ORAL_CAPSULE | Freq: Two times a day (BID) | ORAL | Status: DC
  Administered 2021-12-07 – 2021-12-08 (×2): 100 mg via ORAL
  Filled 2021-12-07 (×2): qty 1

## 2021-12-07 MED ORDER — KETOROLAC TROMETHAMINE 15 MG/ML IJ SOLN: 15.0000 mg | Freq: Once | INTRAMUSCULAR | Status: DC

## 2021-12-07 MED ORDER — ONDANSETRON HCL 4 MG/2ML IJ SOLN: 4.0000 mg | Freq: Four times a day (QID) | INTRAMUSCULAR | Status: DC | PRN

## 2021-12-07 MED ORDER — POVIDONE-IODINE 10 % EX SWAB
Freq: Once | CUTANEOUS | Status: AC
  Administered 2021-12-07: 2 via TOPICAL

## 2021-12-07 MED ORDER — ASPIRIN 81 MG PO CHEW
81.0000 mg | CHEWABLE_TABLET | Freq: Two times a day (BID) | ORAL | Status: DC
  Administered 2021-12-07 – 2021-12-08 (×2): 81 mg via ORAL
  Filled 2021-12-07 (×2): qty 1

## 2021-12-07 MED ORDER — ACETAMINOPHEN 500 MG PO TABS
500.0000 mg | ORAL_TABLET | Freq: Four times a day (QID) | ORAL | Status: AC
  Administered 2021-12-07 – 2021-12-08 (×4): 500 mg via ORAL
  Filled 2021-12-07 (×4): qty 1

## 2021-12-07 MED ORDER — ACETAMINOPHEN 325 MG PO TABS: 325.0000 mg | ORAL_TABLET | Freq: Four times a day (QID) | ORAL | Status: DC | PRN

## 2021-12-07 MED ORDER — BUPIVACAINE LIPOSOME 1.3 % IJ SUSP: 10.0000 mL | Freq: Once | INTRAMUSCULAR | Status: DC

## 2021-12-07 SURGICAL SUPPLY — 46 items
BAG COUNTER SPONGE SURGICOUNT (BAG) ×1 IMPLANT
BAG ZIPLOCK 12X15 (MISCELLANEOUS) IMPLANT
BLADE SAG 18X100X1.27 (BLADE) ×2 IMPLANT
BLADE SURG SZ10 CARB STEEL (BLADE) ×2 IMPLANT
CHLORAPREP W/TINT 26 (MISCELLANEOUS) ×2 IMPLANT
CLSR STERI-STRIP ANTIMIC 1/2X4 (GAUZE/BANDAGES/DRESSINGS) ×2 IMPLANT
COVER PERINEAL POST (MISCELLANEOUS) ×2 IMPLANT
COVER SURGICAL LIGHT HANDLE (MISCELLANEOUS) ×2 IMPLANT
DRAPE IMP U-DRAPE 54X76 (DRAPES) ×2 IMPLANT
DRAPE STERI IOBAN 125X83 (DRAPES) ×2 IMPLANT
DRAPE U-SHAPE 47X51 STRL (DRAPES) ×4 IMPLANT
DRSG MEPILEX BORDER 4X8 (GAUZE/BANDAGES/DRESSINGS) ×2 IMPLANT
ELECT REM PT RETURN 15FT ADLT (MISCELLANEOUS) ×2 IMPLANT
GLOVE BIO SURGEON STRL SZ7.5 (GLOVE) ×2 IMPLANT
GLOVE BIOGEL PI IND STRL 7.5 (GLOVE) ×1 IMPLANT
GLOVE BIOGEL PI IND STRL 8 (GLOVE) ×1 IMPLANT
GLOVE BIOGEL PI INDICATOR 7.5 (GLOVE) ×1
GLOVE BIOGEL PI INDICATOR 8 (GLOVE) ×1
GLOVE SURG SYN 7.5  E (GLOVE) ×2
GLOVE SURG SYN 7.5 E (GLOVE) ×1 IMPLANT
GLOVE SURG SYN 7.5 PF PI (GLOVE) ×1 IMPLANT
GOWN SPEC L4 XLG W/TWL (GOWN DISPOSABLE) ×2 IMPLANT
GOWN STRL REUS W/ TWL LRG LVL3 (GOWN DISPOSABLE) ×1 IMPLANT
GOWN STRL REUS W/TWL LRG LVL3 (GOWN DISPOSABLE) ×2
HEAD BIOLOX HIP 36/-2.5 (Joint) IMPLANT
HIP BIOLOX HD 36/-2.5 (Joint) ×2 IMPLANT
HOLDER FOLEY CATH W/STRAP (MISCELLANEOUS) ×1 IMPLANT
INSERT TRIDENT POLY 36 0DEG (Insert) ×1 IMPLANT
KIT TURNOVER KIT A (KITS) ×1 IMPLANT
MANIFOLD NEPTUNE II (INSTRUMENTS) ×2 IMPLANT
NS IRRIG 1000ML POUR BTL (IV SOLUTION) ×2 IMPLANT
PACK ANTERIOR HIP CUSTOM (KITS) ×2 IMPLANT
PROTECTOR NERVE ULNAR (MISCELLANEOUS) ×2 IMPLANT
SCREW HEX LP 6.5X20 (Screw) ×1 IMPLANT
SHELL ACETABUL CLUSTER SZ 54 (Shell) ×1 IMPLANT
SPIKE FLUID TRANSFER (MISCELLANEOUS) ×4 IMPLANT
STEM 37MM HIP (Hips) ×1 IMPLANT
STRIP CLOSURE SKIN 1/2X4 (GAUZE/BANDAGES/DRESSINGS) ×1 IMPLANT
SUT MNCRL AB 3-0 PS2 18 (SUTURE) ×2 IMPLANT
SUT VIC AB 0 CT1 36 (SUTURE) ×2 IMPLANT
SUT VIC AB 1 CT1 36 (SUTURE) ×2 IMPLANT
SUT VIC AB 2-0 CT1 27 (SUTURE) ×4
SUT VIC AB 2-0 CT1 TAPERPNT 27 (SUTURE) ×2 IMPLANT
TRAY FOLEY MTR SLVR 16FR STAT (SET/KITS/TRAYS/PACK) ×1 IMPLANT
TUBE SUCTION HIGH CAP CLEAR NV (SUCTIONS) ×2 IMPLANT
WATER STERILE IRR 1000ML POUR (IV SOLUTION) ×4 IMPLANT

## 2021-12-07 NOTE — Plan of Care (Signed)
  Problem: Activity: Goal: Risk for activity intolerance will decrease Outcome: Progressing   Problem: Pain Managment: Goal: General experience of comfort will improve Outcome: Progressing   Problem: Safety: Goal: Ability to remain free from injury will improve Outcome: Progressing   

## 2021-12-07 NOTE — Anesthesia Procedure Notes (Signed)
Spinal  Patient location during procedure: OR Start time: 12/07/2021 1:53 PM End time: 12/07/2021 1:55 PM Staffing Performed: resident/CRNA  Anesthesiologist: Leilani Able, MD Resident/CRNA: Doran Clay, CRNA Performed by: Doran Clay, CRNA Authorized by: Doran Clay, CRNA   Preanesthetic Checklist Completed: patient identified, IV checked, site marked, risks and benefits discussed, surgical consent, monitors and equipment checked, pre-op evaluation and timeout performed Spinal Block Patient position: sitting Prep: DuraPrep Patient monitoring: heart rate, cardiac monitor, continuous pulse ox and blood pressure Approach: midline Location: L2-3 Injection technique: single-shot Needle Needle type: Pencan  Needle gauge: 24 G Needle length: 10 cm Needle insertion depth: 7 cm Assessment Sensory level: T6 Events: CSF return Additional Notes Timeout performed. Patient in sitting position . Duraprep applied. L2-3 identified. SAB without difficulty. Patient to supined position.

## 2021-12-08 ENCOUNTER — Encounter (HOSPITAL_COMMUNITY): Payer: Self-pay | Admitting: Orthopedic Surgery

## 2021-12-08 DIAGNOSIS — J45909 Unspecified asthma, uncomplicated: Secondary | ICD-10-CM | POA: Diagnosis not present

## 2021-12-08 DIAGNOSIS — M1611 Unilateral primary osteoarthritis, right hip: Secondary | ICD-10-CM | POA: Diagnosis not present

## 2021-12-08 DIAGNOSIS — I2510 Atherosclerotic heart disease of native coronary artery without angina pectoris: Secondary | ICD-10-CM | POA: Diagnosis not present

## 2021-12-08 DIAGNOSIS — I10 Essential (primary) hypertension: Secondary | ICD-10-CM | POA: Diagnosis not present

## 2021-12-08 DIAGNOSIS — Z96653 Presence of artificial knee joint, bilateral: Secondary | ICD-10-CM | POA: Diagnosis not present

## 2021-12-08 DIAGNOSIS — Z79899 Other long term (current) drug therapy: Secondary | ICD-10-CM | POA: Diagnosis not present

## 2021-12-08 MED ORDER — ASPIRIN 81 MG PO TBEC: 81.0000 mg | DELAYED_RELEASE_TABLET | Freq: Two times a day (BID) | ORAL | 0 refills | Status: DC

## 2021-12-08 MED ORDER — ACETAMINOPHEN 500 MG PO TABS: 1000.0000 mg | ORAL_TABLET | Freq: Four times a day (QID) | ORAL | 0 refills | Status: AC | PRN

## 2021-12-08 MED ORDER — TRAMADOL HCL 50 MG PO TABS: 50.0000 mg | ORAL_TABLET | Freq: Four times a day (QID) | ORAL | 0 refills | Status: AC | PRN

## 2021-12-08 MED ORDER — METHOCARBAMOL 750 MG PO TABS: 750.0000 mg | ORAL_TABLET | Freq: Three times a day (TID) | ORAL | 0 refills | Status: DC | PRN

## 2021-12-08 NOTE — Discharge Summary (Signed)
Physician Discharge Summary  Patient ID: Patrick Mathews MRN: [id number] DOB/AGE: [birth year] 71 y.o.  Admit date: 12/07/2021 Discharge date: 12/08/2021  Admission Diagnoses: right hip osteoarthritis  Discharge Diagnoses:  Principal Problem:   S/P total right hip arthroplasty   Discharged Condition: fair  Hospital Course: Patient underwent a right THA by Dr. Percell Miller on 01/22/74 without complications. He spent the night in observation for pain control and mobilization. He has passed his evaluations and is ready for discharge home.   Consults: None  Significant Diagnostic Studies: n/a  Treatments: IV hydration, antibiotics: Ancef, analgesia: acetaminophen, Vicodin, and Tramadol, anticoagulation: ASA, therapies: PT and OT, and surgery: right THA  Discharge Exam: Blood pressure (!) 118/56, pulse (!) 59, temperature 97.8 F (36.6 C), temperature source Oral, resp. rate 18, height '5\' 10"'$  (1.778 m), weight 94 kg, SpO2 96 %. General appearance: alert, cooperative, and no distress Head: Normocephalic, without obvious abnormality, atraumatic Resp: clear to auscultation bilaterally Cardio: regular rate and rhythm, S1, S2 normal, no murmur, click, rub or gallop GI: soft, non-tender; bowel sounds normal; no masses,  no organomegaly Extremities: extremities normal, atraumatic, no cyanosis or edema Pulses:  L brachial 2+ R brachial 2+  L radial 2+ R radial 2+  L inguinal 2+ R inguinal 2+  L popliteal 2+ R popliteal 2+  L posterior tibial 2+ R posterior tibial 2+  L dorsalis pedis 2+ R dorsalis pedis 2+   Neurologic: Grossly normal Incision/Wound: c/d/i  Disposition: Discharge disposition: 01-Home or Self Care       Discharge Instructions     Call MD / Call 911   Complete by: As directed    If you experience chest pain or shortness of breath, CALL 911 and be transported to the hospital emergency room.  If you develope a fever above 101 F, pus (white drainage) or increased drainage  or redness at the wound, or calf pain, call your surgeon's office.   Diet - low sodium heart healthy   Complete by: As directed    Discharge instructions   Complete by: As directed    You may bear weight as tolerated. Keep your dressing on and dry until follow up. Take medicine to prevent blood clots as directed. Take pain medicine as needed with the goal of transitioning to over the counter medicines.    INSTRUCTIONS AFTER JOINT REPLACEMENT   Remove items at home which could result in a fall. This includes throw rugs or furniture in walking pathways ICE to the affected joint every three hours while awake for 30 minutes at a time, for at least the first 3-5 days, and then as needed for pain and swelling.  Continue to use ice for pain and swelling. You may notice swelling that will progress down to the foot and ankle.  This is normal after surgery.  Elevate your leg when you are not up walking on it.   Continue to use the breathing machine you got in the hospital (incentive spirometer) which will help keep your temperature down.  It is common for your temperature to cycle up and down following surgery, especially at night when you are not up moving around and exerting yourself.  The breathing machine keeps your lungs expanded and your temperature down.   DIET:  As you were doing prior to hospitalization, we recommend a well-balanced diet.  DRESSING / WOUND CARE / SHOWERING  You may shower 3 days after surgery, but keep the wounds dry during showering.  You may use an  occlusive plastic wrap (Press'n Seal for example) with blue painter's tape at edges, NO SOAKING/SUBMERGING IN THE BATHTUB.  If the bandage gets wet, call the office.   ACTIVITY  Increase activity slowly as tolerated, but follow the weight bearing instructions below.   No driving for 6 weeks or until further direction given by your physician.  You cannot drive while taking narcotics.  No lifting or carrying greater than 10 lbs.  until further directed by your surgeon. Avoid periods of inactivity such as sitting longer than an hour when not asleep. This helps prevent blood clots.  You may return to work once you are authorized by your doctor.    WEIGHT BEARING   Weight bearing as tolerated with assist device (walker, cane, etc) as directed, use it as long as suggested by your surgeon or therapist, typically at least 4-6 weeks.   EXERCISES  Results after joint replacement surgery are often greatly improved when you follow the exercise, range of motion and muscle strengthening exercises prescribed by your doctor. Safety measures are also important to protect the joint from further injury. Any time any of these exercises cause you to have increased pain or swelling, decrease what you are doing until you are comfortable again and then slowly increase them. If you have problems or questions, call your caregiver or physical therapist for advice.   Rehabilitation is important following a joint replacement. After just a few days of immobilization, the muscles of the leg can become weakened and shrink (atrophy).  These exercises are designed to build up the tone and strength of the thigh and leg muscles and to improve motion. Often times heat used for twenty to thirty minutes before working out will loosen up your tissues and help with improving the range of motion but do not use heat for the first two weeks following surgery (sometimes heat can increase post-operative swelling).   These exercises can be done on a training (exercise) mat, on the floor, on a table or on a bed. Use whatever works the best and is most comfortable for you.    Use music or television while you are exercising so that the exercises are a pleasant break in your day. This will make your life better with the exercises acting as a break in your routine that you can look forward to.   Perform all exercises about fifteen times, three times per day or as directed.   You should exercise both the operative leg and the other leg as well.  Exercises include:   Quad Sets - Tighten up the muscle on the front of the thigh (Quad) and hold for 5-10 seconds.   Straight Leg Raises - With your knee straight (if you were given a brace, keep it on), lift the leg to 60 degrees, hold for 3 seconds, and slowly lower the leg.  Perform this exercise against resistance later as your leg gets stronger.  Leg Slides: Lying on your back, slowly slide your foot toward your buttocks, bending your knee up off the floor (only go as far as is comfortable). Then slowly slide your foot back down until your leg is flat on the floor again.  Angel Wings: Lying on your back spread your legs to the side as far apart as you can without causing discomfort.  Hamstring Strength:  Lying on your back, push your heel against the floor with your leg straight by tightening up the muscles of your buttocks.  Repeat, but this time bend your  knee to a comfortable angle, and push your heel against the floor.  You may put a pillow under the heel to make it more comfortable if necessary.   A rehabilitation program following joint replacement surgery can speed recovery and prevent re-injury in the future due to weakened muscles. Contact your doctor or a physical therapist for more information on knee rehabilitation.    CONSTIPATION  Constipation is defined medically as fewer than three stools per week and severe constipation as less than one stool per week.  Even if you have a regular bowel pattern at home, your normal regimen is likely to be disrupted due to multiple reasons following surgery.  Combination of anesthesia, postoperative narcotics, change in appetite and fluid intake all can affect your bowels.   YOU MUST use at least one of the following options; they are listed in order of increasing strength to get the job done.  They are all available over the counter, and you may need to use some, POSSIBLY  even all of these options:    Drink plenty of fluids (prune juice may be helpful) and high fiber foods Colace 100 mg by mouth twice a day  Senokot for constipation as directed and as needed Dulcolax (bisacodyl), take with full glass of water  Miralax (polyethylene glycol) once or twice a day as needed.  If you have tried all these things and are unable to have a bowel movement in the first 3-4 days after surgery call either your surgeon or your primary doctor.    If you experience loose stools or diarrhea, hold the medications until you stool forms back up.  If your symptoms do not get better within 1 week or if they get worse, check with your doctor.  If you experience "the worst abdominal pain ever" or develop nausea or vomiting, please contact the office immediately for further recommendations for treatment.   ITCHING:  If you experience itching with your medications, try taking only a single pain pill, or even half a pain pill at a time.  You can also use Benadryl over the counter for itching or also to help with sleep.   TED HOSE STOCKINGS:  Use stockings on both legs until for at least 2 weeks or as directed by physician office. They may be removed at night for sleeping.  MEDICATIONS:  See your medication summary on the "After Visit Summary" that nursing will review with you.  You may have some home medications which will be placed on hold until you complete the course of blood thinner medication.  It is important for you to complete the blood thinner medication as prescribed.  Take medicines as prescribed.   You have several different medicines that work in different ways. - Tylenol is for mild to moderate pain. Try to take this medicine before turning to your narcotic medicines.  - Meloxicam is to reduce pain / inflammation - Robaxin is for muscle spasms. This medicine can make you drowsy. - Tramadol is a narcotic pain medicine.  Take this for severe pain. This medicine can be  dehydrating / constipating. - Zofran is for nausea and vomiting. - Aspirin is to prevent blood clots after surgery. YOU MUST TAKE THIS MEDICINE!!  PRECAUTIONS:  If you experience chest pain or shortness of breath - call 911 immediately for transfer to the hospital emergency department.   If you develop a fever greater that 101 F, purulent drainage from wound, increased redness or drainage from wound, foul odor from the  wound/dressing, or calf pain - CONTACT YOUR SURGEON.                                                   FOLLOW-UP APPOINTMENTS:  If you do not already have a post-op appointment, please call the office [phone] for an appointment to be seen by Dr. Percell Miller in 2 weeks.   OTHER INSTRUCTIONS:   MAKE SURE YOU:  Understand these instructions.  Get help right away if you are not doing well or get worse.    Thank you for letting us be a part of your medical care team.  It is a privilege we respect greatly.  We hope these instructions will help you stay on track for a fast and full recovery!   Driving restrictions   Complete by: As directed    No driving for 2-4 weeks   Post-operative opioid taper instructions:   Complete by: As directed    POST-OPERATIVE OPIOID TAPER INSTRUCTIONS: It is important to wean off of your opioid medication as soon as possible. If you do not need pain medication after your surgery it is ok to stop day one. Opioids include: Codeine, Hydrocodone(Norco, Vicodin), Oxycodone(Percocet, oxycontin) and hydromorphone amongst others.  Long term and even short term use of opiods can cause: Increased pain response Dependence Constipation Depression Respiratory depression And more.  Withdrawal symptoms can include Flu like symptoms Nausea, vomiting And more Techniques to manage these symptoms Hydrate well Eat regular healthy meals Stay active Use relaxation techniques(deep breathing, meditating, yoga) Do Not substitute Alcohol to help with  tapering If you have been on opioids for less than two weeks and do not have pain than it is ok to stop all together.  Plan to wean off of opioids This plan should start within one week post op of your joint replacement. Maintain the same interval or time between taking each dose and first decrease the dose.  Cut the total daily intake of opioids by one tablet each day Next start to increase the time between doses. The last dose that should be eliminated is the evening dose.      TED hose   Complete by: As directed    Use stockings (TED hose) for 2 weeks on right leg(s).  You may remove them at night for sleeping.   Weight bearing as tolerated   Complete by: As directed    Laterality: right   Extremity: Lower         Follow-up Information     Renette Butters, MD. Go on 12/22/2021.   Specialty: Orthopedic Surgery Why: your appointment is scheduled for 4:15. Contact information: [address] [place] [address] [phone]         [facility]. Go on 12/09/2021.   Why: YOur outpatient physical therapy appointment is scheduled for 1:15. Contact information: [place]                Signed: Britt Bottom PA-C 12/08/2021, 1:55 PM

## 2021-12-08 NOTE — Anesthesia Postprocedure Evaluation (Signed)
Anesthesia Post Note  Patient: Patrick Mathews  Procedure(s) Performed: TOTAL HIP ARTHROPLASTY ANTERIOR APPROACH (Right: Hip)     Patient location during evaluation: PACU Anesthesia Type: Spinal Level of consciousness: awake and alert Pain management: pain level controlled Vital Signs Assessment: post-procedure vital signs reviewed and stable Respiratory status: spontaneous breathing, nonlabored ventilation and respiratory function stable Cardiovascular status: blood pressure returned to baseline Postop Assessment: no apparent nausea or vomiting, spinal receding, no headache and no backache Anesthetic complications: no   No notable events documented.             Marthenia Rolling

## 2021-12-08 NOTE — Plan of Care (Signed)

## 2021-12-08 NOTE — Progress Notes (Signed)
    Subjective: Patient reports pain as mild to moderate.  Tolerating diet.  Urinating.   No CP, SOB.  Able to mobilize OOB well with PT.   Objective:   VITALS:   Vitals:   12/08/21 0219 12/08/21 0559 12/08/21 0800 12/08/21 0939  BP: 107/67 111/67 110/67 (!) 118/56  Pulse: (!) 58 61 71 (!) 59  Resp: '18 18 16 18  '$ Temp: 97.8 F (36.6 C) 97.7 F (36.5 C) 98.7 F (37.1 C) 97.8 F (36.6 C)  TempSrc:  Oral Oral Oral  SpO2: 95% 96% 94% 96%  Weight:      Height:          Latest Ref Rng & Units 11/24/2021   10:22 AM 07/17/2020    8:04 AM 06/10/2020   10:52 AM  CBC  WBC 4.0 - 10.5 K/uL 5.0  4.3  5.7   Hemoglobin 13.0 - 17.0 g/dL 15.3  15.7  14.4   Hematocrit 39.0 - 52.0 % 45.6  45.4  42.3   Platelets 150 - 400 K/uL 192  168  243       Latest Ref Rng & Units 11/24/2021   10:22 AM 08/16/2021   11:55 AM 05/25/2021   11:49 AM  BMP  Glucose 70 - 99 mg/dL 108  102  99   BUN 8 - 23 mg/dL '13  13  12   '$ Creatinine 0.61 - 1.24 mg/dL 1.15  1.34  1.14   BUN/Creat Ratio 10 - '24  10  11   '$ Sodium 135 - 145 mmol/L 141  141  142   Potassium 3.5 - 5.1 mmol/L 4.7  4.5  4.6   Chloride 98 - 111 mmol/L 108  103  103   CO2 22 - 32 mmol/L '27  25  25   '$ Calcium 8.9 - 10.3 mg/dL 9.7  9.5  9.6    Intake/Output      06/27 0701 06/28 0700 06/28 0701 06/29 0700   I.V. (mL/kg) 1100 (11.7)    Total Intake(mL/kg) 1100 (11.7)    Urine (mL/kg/hr) 2275    Blood 400    Total Output 2675    Net -1575            Physical Exam: General: NAD.  Sitting up in bedside chair, calm, comfortable Resp: No increased wob Cardio: regular rate and rhythm ABD soft Neurologically intact MSK Neurovascularly intact Sensation intact distally Intact pulses distally Dorsiflexion/Plantar flexion intact Incision: dressing C/D/I   Assessment: 1 Day Post-Op  S/P Procedure(s) (LRB): TOTAL HIP ARTHROPLASTY ANTERIOR APPROACH (Right) by Dr. Ernesta Amble. Murphy on 12/07/21  Principal Problem:   S/P total right hip  arthroplasty   Plan:  Advance diet Up with therapy Incentive Spirometry Elevate and Apply ice  Weightbearing: WBAT RLE Insicional and dressing care: Dressings left intact until follow-up and Reinforce dressings as needed Orthopedic device(s): None Showering: Keep dressing dry VTE prophylaxis: Aspirin '81mg'$  BID  x 30 days , SCDs, ambulation Pain control: Tylenol, Tramadol, Gabapentin, Norco Follow - up plan: 2 weeks Contact information:  Edmonia Lynch MD, Aggie Moats PA-C  Dispo: Home today     Britt Bottom, [place] Office [phone] 12/08/2021, 9:47 AM

## 2021-12-08 NOTE — Evaluation (Signed)
Physical Therapy Evaluation Patient Details Name: Patrick Mathews MRN: [id number] DOB: December 18, 1950 Today's Date: 12/08/2021  History of Present Illness  70 y.o. male admitted 12/07/21 for R AA-THA. PMH: B TKA, cervical fusion, lumbar fusion, HTN, asthma, exertional dyspnea.  Clinical Impression  Pt ambulated 120' with RW, no loss of balance. Pt/spouse demonstrate good understanding of HEP. He is ready to DC home form a PT standpoint.        Recommendations for follow up therapy are one component of a multi-disciplinary discharge planning process, led by the attending physician.  Recommendations may be updated based on patient status, additional functional criteria and insurance authorization.  Follow Up Recommendations Follow physician's recommendations for discharge plan and follow up therapies      Assistance Recommended at Discharge Intermittent Supervision/Assistance  Patient can return home with the following  A little help with bathing/dressing/bathroom;Assist for transportation;Help with stairs or ramp for entrance    Equipment Recommendations None recommended by PT  Recommendations for Other Services       Functional Status Assessment Patient has had a recent decline in their functional status and demonstrates the ability to make significant improvements in function in a reasonable and predictable amount of time.     Precautions / Restrictions Precautions Precautions: Fall Precaution Comments: no hip precautions Restrictions Weight Bearing Restrictions: No Other Position/Activity Restrictions: WBAT      Mobility  Bed Mobility Overal bed mobility: Modified Independent             General bed mobility comments: HOB up, used rail    Transfers Overall transfer level: Needs assistance Equipment used: Rolling walker (2 wheels) Transfers: Sit to/from Stand Sit to Stand: Supervision           General transfer comment: VCs hand placement     Ambulation/Gait Ambulation/Gait assistance: Supervision Gait Distance (Feet): 120 Feet Assistive device: Rolling walker (2 wheels) Gait Pattern/deviations: Step-to pattern, Decreased step length - right, Decreased step length - left Gait velocity: decr     General Gait Details: VCs sequencing, no loss of balance  Stairs            Wheelchair Mobility    Modified Rankin (Stroke Patients Only)       Balance Overall balance assessment: Modified Independent                                           Pertinent Vitals/Pain Pain Assessment Pain Assessment: 0-10 Pain Score: 5  Pain Location: R hip Pain Descriptors / Indicators: Sore Pain Intervention(s): Limited activity within patient's tolerance, Monitored during session, Premedicated before session    Home Living Family/patient expects to be discharged to:: Private residence Living Arrangements: Spouse/significant other Available Help at Discharge: Family Type of Home: House Home Access: Level entry       Home Layout: Two level;Multi-level;Able to live on main level with bedroom/bathroom Home Equipment: Rolling Walker (2 wheels);BSC/3in1;Cane - single point Additional Comments: will stay in basement initially    Prior Function Prior Level of Function : Independent/Modified Independent                     Hand Dominance        Extremity/Trunk Assessment   Upper Extremity Assessment Upper Extremity Assessment: Overall WFL for tasks assessed    Lower Extremity Assessment Lower Extremity Assessment: RLE deficits/detail;LLE deficits/detail RLE Deficits / Details:  hip flexion ~30* AAROM limited by pain RLE Sensation: WNL RLE Coordination: WNL LLE Deficits / Details: reports tingling L foot at baseline 2* prior back surgery LLE Sensation: history of peripheral neuropathy       Communication   Communication: No difficulties  Cognition Arousal/Alertness: Awake/alert Behavior  During Therapy: WFL for tasks assessed/performed Overall Cognitive Status: Within Functional Limits for tasks assessed                                          General Comments      Exercises Total Joint Exercises Ankle Circles/Pumps: AROM, Both, 10 reps, Supine Quad Sets: AROM, Right, 5 reps, Supine Short Arc Quad: AROM, Right, 5 reps, Supine Heel Slides: AAROM, Right, 5 reps, Supine Long Arc Quad: AROM, Right, 5 reps, Seated   Assessment/Plan    PT Assessment Patient needs continued PT services  PT Problem List Decreased strength;Decreased range of motion;Decreased mobility;Decreased activity tolerance;Pain       PT Treatment Interventions Therapeutic activities;Therapeutic exercise;Gait training;Patient/family education;DME instruction    PT Goals (Current goals can be found in the Care Plan section)  Acute Rehab PT Goals Patient Stated Goal: fishing & golf PT Goal Formulation: With patient Time For Goal Achievement: 12/15/21 Potential to Achieve Goals: Good    Frequency 7X/week     Co-evaluation               AM-PAC PT "6 Clicks" Mobility  Outcome Measure Help needed turning from your back to your side while in a flat bed without using bedrails?: A Little Help needed moving from lying on your back to sitting on the side of a flat bed without using bedrails?: A Little Help needed moving to and from a bed to a chair (including a wheelchair)?: None Help needed standing up from a chair using your arms (e.g., wheelchair or bedside chair)?: None Help needed to walk in hospital room?: None Help needed climbing 3-5 steps with a railing? : A Little 6 Click Score: 21    End of Session Equipment Utilized During Treatment: Gait belt Activity Tolerance: Patient tolerated treatment well Patient left: in chair;with chair alarm set;with call bell/phone within reach;with family/visitor present Nurse Communication: Mobility status PT Visit Diagnosis:  Difficulty in walking, not elsewhere classified (R26.2);Pain Pain - Right/Left: Right Pain - part of body: Hip    Time: 7793-9030 PT Time Calculation (min) (ACUTE ONLY): 33 min   Charges:   PT Evaluation $PT Eval Moderate Complexity: 1 Mod PT Treatments $Gait Training: 8-22 mins       Blondell Reveal Kistler PT 12/08/2021  Acute Rehabilitation Services  Office [phone]

## 2021-12-08 NOTE — TOC Transition Note (Signed)
Transition of Care [facility]) - CM/SW Discharge Note   Patient Details  Name: Patrick Mathews MRN: [id number] Date of Birth: 03/16/1951  [facility]) CM/SW Contact:  Lennart Pall, LCSW Phone Number: 12/08/2021, 9:36 AM   Clinical Narrative:     Met with pt and spouse and confirming he has all needed DME at home.  OPPT already set up with [facility]. No TOC needs.  Final next level of care: OP Rehab Barriers to Discharge: No Barriers Identified   Patient Goals and CMS Choice Patient states their goals for this hospitalization and ongoing recovery are:: return home      Discharge Placement                       Discharge Plan and Services                DME Arranged: N/A DME Agency: NA                  Social Determinants of Health (SDOH) Interventions     Readmission Risk Interventions     No data to display

## 2021-12-08 NOTE — Progress Notes (Signed)
Discharge instructions reviewed with patient and caregiver, all concerns and questions addressed. IV removed per protocol, tolerated well, intact. All belongings given. Vital signs stable.

## 2021-12-09 ENCOUNTER — Encounter (HOSPITAL_COMMUNITY): Payer: Self-pay | Admitting: Physical Therapy

## 2021-12-09 ENCOUNTER — Ambulatory Visit (HOSPITAL_COMMUNITY): Payer: Medicare Other | Attending: Orthopedic Surgery | Admitting: Physical Therapy

## 2021-12-09 DIAGNOSIS — M25551 Pain in right hip: Secondary | ICD-10-CM | POA: Diagnosis not present

## 2021-12-09 DIAGNOSIS — R262 Difficulty in walking, not elsewhere classified: Secondary | ICD-10-CM | POA: Diagnosis not present

## 2021-12-09 DIAGNOSIS — M6281 Muscle weakness (generalized): Secondary | ICD-10-CM

## 2021-12-09 DIAGNOSIS — R2689 Other abnormalities of gait and mobility: Secondary | ICD-10-CM

## 2021-12-09 DIAGNOSIS — Z96641 Presence of right artificial hip joint: Secondary | ICD-10-CM | POA: Insufficient documentation

## 2021-12-09 NOTE — Therapy (Signed)
OUTPATIENT PHYSICAL THERAPY LOWER EXTREMITY EVALUATION   Patient Name: Patrick Mathews MRN: [id number] DOB:February 11, 1951, 71 y.o., male Today's Date: 12/09/2021   PT End of Session - 12/09/21 1315     Visit Number 1    Number of Visits 12    Date for PT Re-Evaluation 01/20/22    Authorization Type Primary Medicare Secondary AETNA    Progress Note Due on Visit 10    PT Start Time 1315    PT Stop Time 1354    PT Time Calculation (min) 39 min    Activity Tolerance Patient tolerated treatment well    Behavior During Therapy WFL for tasks assessed/performed             Past Medical History:  Diagnosis Date   Adjacent segment disease with spinal stenosis    Arthritis    "knees; back" (07/14/2014)   Asthma    CAD (coronary artery disease)    Exertional dyspnea    GERD (gastroesophageal reflux disease)    Hyperlipidemia    Hypertension    Primary localized osteoarthritis of left knee    Primary localized osteoarthritis of right knee 05/04/2020   Seasonal allergies    Past Surgical History:  Procedure Laterality Date   ANTERIOR CERVICAL DECOMP/DISCECTOMY FUSION  1999   Dr Evlyn Kanner SURGERY     BUNIONECTOMY Right 2013   COLONOSCOPY N/A 11/06/2012   Procedure: COLONOSCOPY;  Surgeon: Jamesetta So, MD;  Location[facility];  Service: Gastroenterology;  Laterality: N/A;   FOOT SURGERY Right 2013   bunionectomy   KNEE ARTHROSCOPY Bilateral 1990's   POSTERIOR LUMBAR FUSION  1985; 2009   Dr Joya Salm   TONSILLECTOMY  1950's   TOTAL HIP ARTHROPLASTY Right 12/07/2021   Procedure: TOTAL HIP ARTHROPLASTY ANTERIOR APPROACH;  Surgeon: Renette Butters, MD;  Location: [facility];  Service: Orthopedics;  Laterality: Right;   TOTAL KNEE ARTHROPLASTY Left 07/14/2014   Procedure: LEFT TOTAL KNEE ARTHROPLASTY;  Surgeon: [name], MD;  Location: [facility];  Service: Orthopedics;  Laterality: Left;   TOTAL KNEE ARTHROPLASTY Right 05/11/2020   Procedure: TOTAL KNEE ARTHROPLASTY;   Surgeon: Elsie Saas, MD;  Location: [facility];  Service: Orthopedics;  Laterality: Right;   WISDOM TOOTH EXTRACTION     Patient Active Problem List   Diagnosis Date Noted   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   CAD (coronary artery disease) 08/16/2021   DOE (dyspnea on exertion) 08/16/2021   Tremor 04/27/2021   Gait abnormality 04/27/2021   Primary localized osteoarthritis of right knee 05/04/2020   Lumbar adjacent segment disease with spondylolisthesis 02/06/2020   Chest pressure 09/27/2019   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   Morbid obesity ([place]) 05/08/2015   DJD (degenerative joint disease) of knee 07/14/2014   Acute upper respiratory infection 06/12/2014   Cigarette smoker 06/12/2014   Cough 06/12/2014   Primary localized osteoarthritis of left knee    Hyperlipidemia    GERD (gastroesophageal reflux disease)    Back pain    Dyspnea 05/07/2014   Multiple pulmonary nodules 05/07/2014    PCP: Sharilyn Sites MD  REFERRING PROVIDER: Renette Butters, MD   REFERRING DIAG: S/P R ant total hip replacement   THERAPY DIAG:  Pain in right hip  Difficulty in walking, not elsewhere classified  Muscle weakness (generalized)  Other abnormalities of gait and mobility  Rationale for Evaluation and Treatment Rehabilitation  ONSET DATE: 12/07/2021   SUBJECTIVE:   SUBJECTIVE  STATEMENT: Patient states he is moving slow. He had his hip replaced Tuesday and got home yesterday. Hip is sore. He was using his cane sometimes before this hip replacement due to leg issues. Has been trying to walk at home and do exercises he was given.  PERTINENT HISTORY: Hx R TKA - 2021  PAIN:  Are you having pain? Yes: NPRS scale: 5/10 Pain location: R hip  Pain description: sore Aggravating factors: weightbearing Relieving factors: sitting, rest  PRECAUTIONS: None  WEIGHT BEARING RESTRICTIONS No  FALLS:  Has patient fallen in last 6 months?  Yes. Number of falls 2-3  LIVING ENVIRONMENT: Lives with: lives with their family Lives in: House/apartment Stairs: Yes: Internal: 15 steps; on right going up and External: 2 steps; none Has following equipment at home: Single point cane, Walker - 2 wheeled, and bed side commode  OCCUPATION: Retired  PLOF: Independent  PATIENT GOALS be able to walk without AD, work in yard, fish   OBJECTIVE:  PATIENT SURVEYS:  FOTO 35% function  COGNITION:  Overall cognitive status: Within functional limits for tasks assessed     SENSATION: WFL  POSTURE: No Significant postural limitations, rounded shoulders, and forward head  OBSERVATION: slight redness in proximal femoral region, bandage intact  PALPATION: Grossly tender throughout R hip  LOWER EXTREMITY ROM:  R hip decreased due to pain  Active ROM Right eval Left eval  Hip flexion    Hip extension    Hip abduction    Hip adduction    Hip internal rotation    Hip external rotation    Knee flexion    Knee extension    Ankle dorsiflexion    Ankle plantarflexion    Ankle inversion    Ankle eversion     (Blank rows = not tested)  LOWER EXTREMITY MMT:  MMT Right eval Left eval  Hip flexion 4-/5* 5/5  Hip extension    Hip abduction 3-/5 *   Hip adduction    Hip internal rotation    Hip external rotation    Knee flexion 5/5 5/5  Knee extension 5/5 5/5  Ankle dorsiflexion 5/5 5/5  Ankle plantarflexion    Ankle inversion    Ankle eversion     (Blank rows = not tested) * =pain   FUNCTIONAL TESTS:  5 times sit to stand: 14.02 seconds with UE support 2 minute walk test: 115 feet Bed mobility: assist for LE with sit to supine, assist for RLE and trunk with supine to sit due to pain/weakness in RLE  GAIT: Distance walked: 115 feet Assistive device utilized: Walker - 2 wheeled Level of assistance: Modified independence Comments: 2MWT, slow, labored, trunk flexed     TODAY'S TREATMENT: 12/09/21 Quad set 10 x 10  second holds Glute set 10 x 10 second holds Ankle pumps x 20 Heel slides RLE 2x 10    PATIENT EDUCATION:  Education details: Patient educated on exam findings, POC, scope of PT, HEP. Person educated: Patient Education method: Explanation, Demonstration, and Handouts Education comprehension: verbalized understanding, returned demonstration, verbal cues required, and tactile cues required   HOME EXERCISE PROGRAM: 6/29/23Access Code: [email or web address] - Supine Quadricep Sets  - 3-5 x daily - 7 x weekly - 10 reps - 10 second hold - Gluteal Sets  - 3-5 x daily - 7 x weekly - 10 reps - 10 second hold - Supine Ankle Pumps  - 3-5 x daily - 7 x weekly - 20 reps - Supine Heel Slide  - 3-5  x daily - 7 x weekly - 2 sets - 10 reps  ASSESSMENT:  CLINICAL IMPRESSION: Patient a 71 y.o. y.o. male who was seen today for physical therapy evaluation and treatment for s/p R anterior THA on 12/07/21. Patient presents with pain limited deficits in R hip strength, ROM, endurance, activity tolerance, gait, balance, and functional mobility with ADL. Patient is having to modify and restrict ADL as indicated by outcome measure score as well as subjective information and objective measures which is affecting overall participation. Patient will benefit from skilled physical therapy in order to improve function and reduce impairment.   OBJECTIVE IMPAIRMENTS Abnormal gait, decreased activity tolerance, decreased balance, decreased endurance, decreased mobility, difficulty walking, decreased ROM, decreased strength, increased muscle spasms, impaired flexibility, improper body mechanics, and pain.   ACTIVITY LIMITATIONS lifting, bending, standing, squatting, sleeping, stairs, transfers, locomotion level, and caring for others  PARTICIPATION LIMITATIONS: meal prep, cleaning, laundry, shopping, community activity, and yard work  PERSONAL FACTORS Time since onset of injury/illness/exacerbation are also affecting patient's  functional outcome.   REHAB POTENTIAL: Good  CLINICAL DECISION MAKING: Stable/uncomplicated  EVALUATION COMPLEXITY: Low   GOALS: Goals reviewed with patient? Yes  SHORT TERM GOALS: Target date: 12/30/2021  Patient will be independent with HEP in order to improve functional outcomes. Baseline:  Goal status: INITIAL  2.  Patient will report at least 25% improvement in symptoms for improved quality of life. Baseline:  Goal status: INITIAL    LONG TERM GOALS: Target date: 01/20/2022  Patient will report at least 75% improvement in symptoms for improved quality of life. Baseline:  Goal status: INITIAL  2.  Patient will improve FOTO score by at least 35 points in order to indicate improved tolerance to activity. Baseline: 35% function Goal status: INITIAL  3.  Patient will be able to navigate stairs with reciprocal pattern without compensation in order to demonstrate improved LE strength. Baseline: states step too pattern Goal status: INITIAL  4.  Patient will be able to ambulate at least 400 feet in 2MWT without AD in order to demonstrate improved tolerance to activity. Baseline: 115 feet with RW Goal status: INITIAL  5.  Patient will be able to complete 5x STS in under 11.4 seconds without UE support in order to reduce the risk of falls. Baseline: 14.02 seconds with UE support Goal status: INITIAL  6.  Patient will be able to return to all activities unrestricted for improved ability to golf, walk, and participate with family.  Baseline: restricted Goal status: INITIAL    PLAN: PT FREQUENCY: 2x/week  PT DURATION: 6 weeks  PLANNED INTERVENTIONS: Therapeutic exercises, Therapeutic activity, Neuromuscular re-education, Balance training, Gait training, Patient/Family education, Joint manipulation, Joint mobilization, Stair training, Orthotic/Fit training, DME instructions, Aquatic Therapy, Dry Needling, Electrical stimulation, Spinal manipulation, Spinal mobilization,  Cryotherapy, Moist heat, Compression bandaging, scar mobilization, Splintting, Taping, Traction, Ultrasound, Ionotophoresis '4mg'$ /ml Dexamethasone, and Manual therapy  PLAN FOR NEXT SESSION: hip strength, mobility and progress as tolerated   Vianne Bulls Murline Weigel, PT 12/09/2021, 2:04 PM

## 2021-12-13 ENCOUNTER — Ambulatory Visit (HOSPITAL_COMMUNITY): Payer: Medicare Other | Attending: Family Medicine

## 2021-12-13 DIAGNOSIS — M6281 Muscle weakness (generalized): Secondary | ICD-10-CM | POA: Insufficient documentation

## 2021-12-13 DIAGNOSIS — R262 Difficulty in walking, not elsewhere classified: Secondary | ICD-10-CM | POA: Insufficient documentation

## 2021-12-13 DIAGNOSIS — M25551 Pain in right hip: Secondary | ICD-10-CM | POA: Diagnosis not present

## 2021-12-13 DIAGNOSIS — Z96641 Presence of right artificial hip joint: Secondary | ICD-10-CM | POA: Insufficient documentation

## 2021-12-13 DIAGNOSIS — R269 Unspecified abnormalities of gait and mobility: Secondary | ICD-10-CM | POA: Diagnosis not present

## 2021-12-13 DIAGNOSIS — R2689 Other abnormalities of gait and mobility: Secondary | ICD-10-CM

## 2021-12-13 NOTE — Therapy (Signed)
OUTPATIENT PHYSICAL THERAPY LOWER EXTREMITY TREATMENT  Patient Name: Patrick Mathews MRN: [id number] DOB:12-20-1950, 71 y.o., male Today's Date: 12/13/2021   PT End of Session - 12/13/21 1303     Visit Number 2    Number of Visits 12    Date for PT Re-Evaluation 01/20/22    Authorization Type Primary Medicare Secondary AETNA    Progress Note Due on Visit 10    PT Start Time 1302    PT Stop Time 1884    PT Time Calculation (min) 43 min    Activity Tolerance Patient tolerated treatment well    Behavior During Therapy [facility] for tasks assessed/performed              Past Medical History:  Diagnosis Date   Adjacent segment disease with spinal stenosis    Arthritis    "knees; back" (07/14/2014)   Asthma    CAD (coronary artery disease)    Exertional dyspnea    GERD (gastroesophageal reflux disease)    Hyperlipidemia    Hypertension    Primary localized osteoarthritis of left knee    Primary localized osteoarthritis of right knee 05/04/2020   Seasonal allergies    Past Surgical History:  Procedure Laterality Date   ANTERIOR CERVICAL DECOMP/DISCECTOMY FUSION  1999   Dr Evlyn Kanner SURGERY     BUNIONECTOMY Right 2013   COLONOSCOPY N/A 11/06/2012   Procedure: COLONOSCOPY;  Surgeon: Jamesetta So, MD;  [facility];  Service: Gastroenterology;  Laterality: N/A;   FOOT SURGERY Right 2013   bunionectomy   KNEE ARTHROSCOPY Bilateral 1990's   POSTERIOR LUMBAR FUSION  1985; 2009   Dr Joya Salm   TONSILLECTOMY  1950's   TOTAL HIP ARTHROPLASTY Right 12/07/2021   Procedure: TOTAL HIP ARTHROPLASTY ANTERIOR APPROACH;  Surgeon: Renette Butters, MD;  Location: [facility];  Service: Orthopedics;  Laterality: Right;   TOTAL KNEE ARTHROPLASTY Left 07/14/2014   Procedure: LEFT TOTAL KNEE ARTHROPLASTY;  Surgeon: [name], MD;  Location: [place];  Service: Orthopedics;  Laterality: Left;   TOTAL KNEE ARTHROPLASTY Right 05/11/2020   Procedure: TOTAL KNEE ARTHROPLASTY;   Surgeon: Elsie Saas, MD;  Location: [facility];  Service: Orthopedics;  Laterality: Right;   WISDOM TOOTH EXTRACTION     Patient Active Problem List   Diagnosis Date Noted   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   CAD (coronary artery disease) 08/16/2021   DOE (dyspnea on exertion) 08/16/2021   Tremor 04/27/2021   Gait abnormality 04/27/2021   Primary localized osteoarthritis of right knee 05/04/2020   Lumbar adjacent segment disease with spondylolisthesis 02/06/2020   Chest pressure 09/27/2019   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   Morbid obesity ([place]) 05/08/2015   DJD (degenerative joint disease) of knee 07/14/2014   Acute upper respiratory infection 06/12/2014   Cigarette smoker 06/12/2014   Cough 06/12/2014   Primary localized osteoarthritis of left knee    Hyperlipidemia    GERD (gastroesophageal reflux disease)    Back pain    Dyspnea 05/07/2014   Multiple pulmonary nodules 05/07/2014    PCP: Sharilyn Sites MD  REFERRING PROVIDER: Renette Butters, MD   REFERRING DIAG: S/P R ant total hip replacement   THERAPY DIAG:  Pain in right hip  Difficulty in walking, not elsewhere classified  Muscle weakness (generalized)  Other abnormalities of gait and mobility  Rationale for Evaluation and Treatment Rehabilitation  ONSET DATE: 12/07/2021   SUBJECTIVE:   SUBJECTIVE  STATEMENT: Patient reports some soreness anterior thigh 5-6/10; no questions regarding HEP. He is sleeping in a recliner.   PERTINENT HISTORY: Hx R TKA - 2021  PAIN:  Are you having pain? Yes: NPRS scale: 5-6/10 Pain location: R hip  Pain description: sore Aggravating factors: weightbearing Relieving factors: sitting, rest  PRECAUTIONS: None  WEIGHT BEARING RESTRICTIONS No  FALLS:  Has patient fallen in last 6 months? Yes. Number of falls 2-3  LIVING ENVIRONMENT: Lives with: lives with their family Lives in: House/apartment Stairs: Yes:  Internal: 15 steps; on right going up and External: 2 steps; none Has following equipment at home: Single point cane, Walker - 2 wheeled, and bed side commode  OCCUPATION: Retired  PLOF: [place] be able to walk without AD, work in yard, fish   OBJECTIVE:  PATIENT SURVEYS:  FOTO 35% function  COGNITION:  Overall cognitive status: Within functional limits for tasks assessed     SENSATION: WFL  POSTURE: No Significant postural limitations, rounded shoulders, and forward head  OBSERVATION: slight redness in proximal femoral region, bandage intact  PALPATION: Grossly tender throughout R hip  LOWER EXTREMITY ROM:  R hip decreased due to pain  Active ROM Right eval Left eval  Hip flexion    Hip extension    Hip abduction    Hip adduction    Hip internal rotation    Hip external rotation    Knee flexion    Knee extension    Ankle dorsiflexion    Ankle plantarflexion    Ankle inversion    Ankle eversion     (Blank rows = not tested)  LOWER EXTREMITY MMT:  MMT Right eval Left eval  Hip flexion 4-/5* 5/5  Hip extension    Hip abduction 3-/5 *   Hip adduction    Hip internal rotation    Hip external rotation    Knee flexion 5/5 5/5  Knee extension 5/5 5/5  Ankle dorsiflexion 5/5 5/5  Ankle plantarflexion    Ankle inversion    Ankle eversion     (Blank rows = not tested) * =pain   FUNCTIONAL TESTS:  5 times sit to stand: 14.02 seconds with UE support 2 minute walk test: 115 feet Bed mobility: assist for LE with sit to supine, assist for RLE and trunk with supine to sit due to pain/weakness in RLE  GAIT: Distance walked: 115 feet Assistive device utilized: Walker - 2 wheeled Level of assistance: Modified independence Comments: 2MWT, slow, labored, trunk flexed     TODAY'S TREATMENT: 12/13/21 Review of HEP and goals Standing // bars Heel raises 2 x 10 Toe raises 2 x 10 Marching x 10 4" step up 2 x 10 4" lateral step ups 2 x  10 Slant board 3 x 30"   Sit to stand 2 x 5  Supine: SAQ's 2 x 10 Heel slides x 10 Quad sets x 10    12/09/21 Quad set 10 x 10 second holds Glute set 10 x 10 second holds Ankle pumps x 20 Heel slides RLE 2x 10    PATIENT EDUCATION:  Education details: Patient educated on exam findings, POC, scope of PT, HEP. Person educated: Patient Education method: Explanation, Demonstration, and Handouts Education comprehension: verbalized understanding, returned demonstration, verbal cues required, and tactile cues required   HOME EXERCISE PROGRAM: 6/29/23Access Code: [email or web address] - Supine Quadricep Sets  - 3-5 x daily - 7 x weekly - 10 reps - 10 second hold - Gluteal Sets  -  3-5 x daily - 7 x weekly - 10 reps - 10 second hold - Supine Ankle Pumps  - 3-5 x daily - 7 x weekly - 20 reps - Supine Heel Slide  - 3-5 x daily - 7 x weekly - 2 sets - 10 reps  ASSESSMENT:  CLINICAL IMPRESSION: Today's session started with review of HEP and goals. Patient verbalizes agreement with set rehab goals.  Noted mild drop foot and complaint of neuropathy left leg (non surgical).Progressed strengthening exercise without complaint of increased pain; noted fatigue after treatment. Discussed with patient use of ice to alleviate soreness and manage swelling and he verbalizes understanding. Patient will benefit from skilled physical therapy in order to improve function and reduce impairment.   OBJECTIVE IMPAIRMENTS Abnormal gait, decreased activity tolerance, decreased balance, decreased endurance, decreased mobility, difficulty walking, decreased ROM, decreased strength, increased muscle spasms, impaired flexibility, improper body mechanics, and pain.   ACTIVITY LIMITATIONS lifting, bending, standing, squatting, sleeping, stairs, transfers, locomotion level, and caring for others  PARTICIPATION LIMITATIONS: meal prep, cleaning, laundry, shopping, community activity, and yard work  PERSONAL FACTORS Time since  onset of injury/illness/exacerbation are also affecting patient's functional outcome.   REHAB POTENTIAL: Good  CLINICAL DECISION MAKING: Stable/uncomplicated  EVALUATION COMPLEXITY: Low   GOALS: Goals reviewed with patient? Yes  SHORT TERM GOALS: Target date: 12/30/2021  Patient will be independent with HEP in order to improve functional outcomes. Baseline:  Goal status: ongoing  2.  Patient will report at least 25% improvement in symptoms for improved quality of life. Baseline:  Goal status: ongoing    LONG TERM GOALS: Target date: 01/20/2022  Patient will report at least 75% improvement in symptoms for improved quality of life. Baseline:  Goal status: ongoing  2.  Patient will improve FOTO score by at least 35 points in order to indicate improved tolerance to activity. Baseline: 35% function Goal status: ongoing  3.  Patient will be able to navigate stairs with reciprocal pattern without compensation in order to demonstrate improved LE strength. Baseline: states step too pattern Goal status: ongoing  4.  Patient will be able to ambulate at least 400 feet in 2MWT without AD in order to demonstrate improved tolerance to activity. Baseline: 115 feet with RW Goal status: ongoing  5.  Patient will be able to complete 5x STS in under 11.4 seconds without UE support in order to reduce the risk of falls. Baseline: 14.02 seconds with UE support Goal status: ongoing  6.  Patient will be able to return to all activities unrestricted for improved ability to golf, walk, and participate with family.  Baseline: restricted Goal status: ongoing    PLAN: PT FREQUENCY: 2x/week  PT DURATION: 6 weeks  PLANNED INTERVENTIONS: Therapeutic exercises, Therapeutic activity, Neuromuscular re-education, Balance training, Gait training, Patient/Family education, Joint manipulation, Joint mobilization, Stair training, Orthotic/Fit training, DME instructions, Aquatic Therapy, Dry Needling,  Electrical stimulation, Spinal manipulation, Spinal mobilization, Cryotherapy, Moist heat, Compression bandaging, scar mobilization, Splintting, Taping, Traction, Ultrasound, Ionotophoresis '4mg'$ /ml Dexamethasone, and Manual therapy  PLAN FOR NEXT SESSION: hip strength, mobility and progress as tolerated; sees [facility] 7/12   1:51 PM, 12/13/21 Obinna Ehresman Small Crystelle Ferrufino MPT [place] [facility]  [phone] ER:[phone]

## 2021-12-15 ENCOUNTER — Ambulatory Visit (HOSPITAL_COMMUNITY): Payer: Medicare Other | Attending: Orthopedic Surgery

## 2021-12-15 DIAGNOSIS — R262 Difficulty in walking, not elsewhere classified: Secondary | ICD-10-CM | POA: Diagnosis not present

## 2021-12-15 DIAGNOSIS — R2689 Other abnormalities of gait and mobility: Secondary | ICD-10-CM

## 2021-12-15 DIAGNOSIS — M25551 Pain in right hip: Secondary | ICD-10-CM

## 2021-12-15 DIAGNOSIS — M6281 Muscle weakness (generalized): Secondary | ICD-10-CM

## 2021-12-15 NOTE — Therapy (Signed)
OUTPATIENT PHYSICAL THERAPY LOWER EXTREMITY TREATMENT  Patient Name: Patrick Mathews MRN: [id number] DOB:[birth year], 71 y.o., male Today's Date: 12/15/2021   PT End of Session - 12/15/21 1424     Visit Number 3    Number of Visits 12    Date for PT Re-Evaluation 01/20/22    Authorization Type Primary Medicare Secondary AETNA    Progress Note Due on Visit 10    PT Start Time 1348    PT Stop Time 1427    PT Time Calculation (min) 39 min    Activity Tolerance Patient tolerated treatment well    Behavior During Therapy [facility] for tasks assessed/performed               Past Medical History:  Diagnosis Date   Adjacent segment disease with spinal stenosis    Arthritis    "knees; back" (07/14/2014)   Asthma    CAD (coronary artery disease)    Exertional dyspnea    GERD (gastroesophageal reflux disease)    Hyperlipidemia    Hypertension    Primary localized osteoarthritis of left knee    Primary localized osteoarthritis of right knee 05/04/2020   Seasonal allergies    Past Surgical History:  Procedure Laterality Date   ANTERIOR CERVICAL DECOMP/DISCECTOMY FUSION  1999   Dr Evlyn Kanner SURGERY     BUNIONECTOMY Right 2013   COLONOSCOPY N/A 11/06/2012   Procedure: COLONOSCOPY;  Surgeon: Jamesetta So, MD;  [facility];  Service: Gastroenterology;  Laterality: N/A;   FOOT SURGERY Right 2013   bunionectomy   KNEE ARTHROSCOPY Bilateral 1990's   POSTERIOR LUMBAR FUSION  1985; 2009   Dr Joya Salm   TONSILLECTOMY  1950's   TOTAL HIP ARTHROPLASTY Right 12/07/2021   Procedure: TOTAL HIP ARTHROPLASTY ANTERIOR APPROACH;  Surgeon: Renette Butters, MD;  [facility];  Service: Orthopedics;  Laterality: Right;   TOTAL KNEE ARTHROPLASTY Left 07/14/2014   Procedure: LEFT TOTAL KNEE ARTHROPLASTY;  Surgeon: [name], MD;  Location: [place];  Service: Orthopedics;  Laterality: Left;   TOTAL KNEE ARTHROPLASTY Right 05/11/2020   Procedure: TOTAL KNEE ARTHROPLASTY;   Surgeon: Elsie Saas, MD;  [facility];  Service: Orthopedics;  Laterality: Right;   WISDOM TOOTH EXTRACTION     Patient Active Problem List   Diagnosis Date Noted   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   CAD (coronary artery disease) 08/16/2021   DOE (dyspnea on exertion) 08/16/2021   Tremor 04/27/2021   Gait abnormality 04/27/2021   Primary localized osteoarthritis of right knee 05/04/2020   Lumbar adjacent segment disease with spondylolisthesis 02/06/2020   Chest pressure 09/27/2019   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   Morbid obesity ([place]) 05/08/2015   DJD (degenerative joint disease) of knee 07/14/2014   Acute upper respiratory infection 06/12/2014   Cigarette smoker 06/12/2014   Cough 06/12/2014   Primary localized osteoarthritis of left knee    Hyperlipidemia    GERD (gastroesophageal reflux disease)    Back pain    Dyspnea 05/07/2014   Multiple pulmonary nodules 05/07/2014    PCP: Sharilyn Sites MD  REFERRING PROVIDER: Renette Butters, MD   REFERRING DIAG: S/P R ant total hip replacement   THERAPY DIAG:  Pain in right hip  Difficulty in walking, not elsewhere classified  Muscle weakness (generalized)  Other abnormalities of gait and mobility  Rationale for Evaluation and Treatment Rehabilitation  ONSET DATE: 12/07/2021   SUBJECTIVE:  SUBJECTIVE STATEMENT: A little soreness continues; has been icing some for soreness ; still sleeping in recliner but going to try to get into the bed tonight.  PERTINENT HISTORY: Hx R TKA - 2021  PAIN:  Are you having pain? Yes: NPRS scale: 3/10 Pain location: R hip  Pain description: sore Aggravating factors: weightbearing Relieving factors: sitting, rest  PRECAUTIONS: None  WEIGHT BEARING RESTRICTIONS No  FALLS:  Has patient fallen in last 6 months? Yes. Number of falls 2-3  LIVING ENVIRONMENT: Lives with: lives with their family Lives in:  House/apartment Stairs: Yes: Internal: 15 steps; on right going up and External: 2 steps; none Has following equipment at home: Single point cane, Walker - 2 wheeled, and bed side commode  OCCUPATION: Retired  PLOF: [place] be able to walk without AD, work in yard, fish   OBJECTIVE:  PATIENT SURVEYS:  FOTO 35% function  COGNITION:  Overall cognitive status: Within functional limits for tasks assessed     SENSATION: WFL  POSTURE: No Significant postural limitations, rounded shoulders, and forward head  OBSERVATION: slight redness in proximal femoral region, bandage intact  PALPATION: Grossly tender throughout R hip  LOWER EXTREMITY ROM:  R hip decreased due to pain  Active ROM Right eval Left eval  Hip flexion    Hip extension    Hip abduction    Hip adduction    Hip internal rotation    Hip external rotation    Knee flexion    Knee extension    Ankle dorsiflexion    Ankle plantarflexion    Ankle inversion    Ankle eversion     (Blank rows = not tested)  LOWER EXTREMITY MMT:  MMT Right eval Left eval  Hip flexion 4-/5* 5/5  Hip extension    Hip abduction 3-/5 *   Hip adduction    Hip internal rotation    Hip external rotation    Knee flexion 5/5 5/5  Knee extension 5/5 5/5  Ankle dorsiflexion 5/5 5/5  Ankle plantarflexion    Ankle inversion    Ankle eversion     (Blank rows = not tested) * =pain   FUNCTIONAL TESTS:  5 times sit to stand: 14.02 seconds with UE support 2 minute walk test: 115 feet Bed mobility: assist for LE with sit to supine, assist for RLE and trunk with supine to sit due to pain/weakness in RLE  GAIT: Distance walked: 115 feet Assistive device utilized: Walker - 2 wheeled Level of assistance: Modified independence Comments: 2MWT, slow, labored, trunk flexed     TODAY'S TREATMENT: 12/15/2021 Standing //bars Heel raises 2 x 10 Toe raises 2 x 10 Marching x 10 6" step ups x 15 6" lateral step ups x  15 Mini squats chair for goal x 10 TKE's green Tband x 20 Tandem stance x 30 sec each Step navigation over and back x 2 with CGA    12/13/21 Review of HEP and goals Standing // bars Heel raises 2 x 10 Toe raises 2 x 10 Marching x 10 4" step up 2 x 10 4" lateral step ups 2 x 10 Slant board 3 x 30"   Sit to stand 2 x 5  Supine: SAQ's 2 x 10 Heel slides x 10 Quad sets x 10    12/09/21 Quad set 10 x 10 second holds Glute set 10 x 10 second holds Ankle pumps x 20 Heel slides RLE 2x 10    PATIENT EDUCATION:  Education details:  Patient educated on exam findings, POC, scope of PT, HEP. Person educated: Patient Education method: Explanation, Demonstration, and Handouts Education comprehension: verbalized understanding, returned demonstration, verbal cues required, and tactile cues required   HOME EXERCISE PROGRAM: 12/15/2021 Access Code: [email or web address] URL: [email or web address] Date: 12/15/2021 Prepared by: [facility]  Exercises - Supine Quadricep Sets  - 3-5 x daily - 7 x weekly - 10 reps - 10 second hold - Gluteal Sets  - 3-5 x daily - 7 x weekly - 10 reps - 10 second hold - Supine Ankle Pumps  - 3-5 x daily - 7 x weekly - 20 reps - Supine Heel Slide  - 3-5 x daily - 7 x weekly - 2 sets - 10 reps - Mini Squat with Counter Support  - 2 x daily - 7 x weekly - 1 sets - 10 reps - Standing Tandem Balance with Counter Support  - 2 x daily - 7 x weekly - 1 sets - 1 reps - 30 sec hold ASSESSMENT:  CLINICAL IMPRESSION: Today's session continued to focus on lower extremity strengthening and balance.  Added tandem stance for balance; increased step height for step ups, added squats and TKE's for strengthening. Patient able to navigate steps reciprocal pattern with 1 rail hand held assist and CGA from therapist today although slower speed and increased caution noted.  Patient will benefit from skilled physical therapy in order to improve function and reduce  impairment.   OBJECTIVE IMPAIRMENTS Abnormal gait, decreased activity tolerance, decreased balance, decreased endurance, decreased mobility, difficulty walking, decreased ROM, decreased strength, increased muscle spasms, impaired flexibility, improper body mechanics, and pain.   ACTIVITY LIMITATIONS lifting, bending, standing, squatting, sleeping, stairs, transfers, locomotion level, and caring for others  PARTICIPATION LIMITATIONS: meal prep, cleaning, laundry, shopping, community activity, and yard work  PERSONAL FACTORS Time since onset of injury/illness/exacerbation are also affecting patient's functional outcome.   REHAB POTENTIAL: Good  CLINICAL DECISION MAKING: Stable/uncomplicated  EVALUATION COMPLEXITY: Low   GOALS: Goals reviewed with patient? Yes  SHORT TERM GOALS: Target date: 12/30/2021  Patient will be independent with HEP in order to improve functional outcomes. Baseline:  Goal status: ongoing  2.  Patient will report at least 25% improvement in symptoms for improved quality of life. Baseline:  Goal status: ongoing    LONG TERM GOALS: Target date: 01/20/2022  Patient will report at least 75% improvement in symptoms for improved quality of life. Baseline:  Goal status: ongoing  2.  Patient will improve FOTO score by at least 35 points in order to indicate improved tolerance to activity. Baseline: 35% function Goal status: ongoing  3.  Patient will be able to navigate stairs with reciprocal pattern without compensation in order to demonstrate improved LE strength. Baseline: states step too pattern Goal status: ongoing  4.  Patient will be able to ambulate at least 400 feet in 2MWT without AD in order to demonstrate improved tolerance to activity. Baseline: 115 feet with RW Goal status: ongoing  5.  Patient will be able to complete 5x STS in under 11.4 seconds without UE support in order to reduce the risk of falls. Baseline: 14.02 seconds with UE  support Goal status: ongoing  6.  Patient will be able to return to all activities unrestricted for improved ability to golf, walk, and participate with family.  Baseline: restricted Goal status: ongoing    PLAN: PT FREQUENCY: 2x/week  PT DURATION: 6 weeks  PLANNED INTERVENTIONS: Therapeutic exercises, Therapeutic activity, Neuromuscular re-education, Balance training, Gait  training, Patient/Family education, Joint manipulation, Joint mobilization, Stair training, Orthotic/Fit training, DME instructions, Aquatic Therapy, Dry Needling, Electrical stimulation, Spinal manipulation, Spinal mobilization, Cryotherapy, Moist heat, Compression bandaging, scar mobilization, Splintting, Taping, Traction, Ultrasound, Ionotophoresis '4mg'$ /ml Dexamethasone, and Manual therapy  PLAN FOR NEXT SESSION: hip strength, mobility and progress as tolerated; sees [facility] 7/12   2:30 PM, 12/15/21 Patrick Mathews MPT [facility] [place] [phone] [phone]

## 2021-12-20 ENCOUNTER — Encounter (HOSPITAL_COMMUNITY): Payer: Self-pay | Admitting: Physical Therapy

## 2021-12-20 ENCOUNTER — Ambulatory Visit (HOSPITAL_COMMUNITY): Payer: Medicare Other | Admitting: Physical Therapy

## 2021-12-20 ENCOUNTER — Encounter (HOSPITAL_COMMUNITY): Payer: Medicare Other | Admitting: Physical Therapy

## 2021-12-20 DIAGNOSIS — R2689 Other abnormalities of gait and mobility: Secondary | ICD-10-CM | POA: Diagnosis not present

## 2021-12-20 DIAGNOSIS — M6281 Muscle weakness (generalized): Secondary | ICD-10-CM

## 2021-12-20 DIAGNOSIS — R262 Difficulty in walking, not elsewhere classified: Secondary | ICD-10-CM | POA: Diagnosis not present

## 2021-12-20 DIAGNOSIS — M25551 Pain in right hip: Secondary | ICD-10-CM | POA: Diagnosis not present

## 2021-12-20 NOTE — Therapy (Signed)
OUTPATIENT PHYSICAL THERAPY LOWER EXTREMITY TREATMENT  Patient Name: Patrick Mathews MRN: [id number] DOB:22-Dec-1950, 71 y.o., male Today's Date: 12/20/2021   PT End of Session - 12/20/21 1346     Visit Number 4    Number of Visits 12    Date for PT Re-Evaluation 01/20/22    Authorization Type Primary Medicare Secondary AETNA    Progress Note Due on Visit 10    PT Start Time 1346    PT Stop Time 1426    PT Time Calculation (min) 40 min    Activity Tolerance Patient tolerated treatment well    Behavior During Therapy [facility] for tasks assessed/performed               Past Medical History:  Diagnosis Date   Adjacent segment disease with spinal stenosis    Arthritis    "knees; back" (07/14/2014)   Asthma    CAD (coronary artery disease)    Exertional dyspnea    GERD (gastroesophageal reflux disease)    Hyperlipidemia    Hypertension    Primary localized osteoarthritis of left knee    Primary localized osteoarthritis of right knee 05/04/2020   Seasonal allergies    Past Surgical History:  Procedure Laterality Date   ANTERIOR CERVICAL DECOMP/DISCECTOMY FUSION  1999   Dr Evlyn Kanner SURGERY     BUNIONECTOMY Right 2013   COLONOSCOPY N/A 11/06/2012   Procedure: COLONOSCOPY;  Surgeon: Jamesetta So, MD;  [facility];  Service: Gastroenterology;  Laterality: N/A;   FOOT SURGERY Right 2013   bunionectomy   KNEE ARTHROSCOPY Bilateral 1990's   POSTERIOR LUMBAR FUSION  1985; 2009   Dr Joya Salm   TONSILLECTOMY  1950's   TOTAL HIP ARTHROPLASTY Right 12/07/2021   Procedure: TOTAL HIP ARTHROPLASTY ANTERIOR APPROACH;  Surgeon: Renette Butters, MD;  Location: [facility];  Service: Orthopedics;  Laterality: Right;   TOTAL KNEE ARTHROPLASTY Left 07/14/2014   Procedure: LEFT TOTAL KNEE ARTHROPLASTY;  Surgeon: [name], MD;  Location: [place];  Service: Orthopedics;  Laterality: Left;   TOTAL KNEE ARTHROPLASTY Right 05/11/2020   Procedure: TOTAL KNEE ARTHROPLASTY;   Surgeon: Elsie Saas, MD;  Location: [facility];  Service: Orthopedics;  Laterality: Right;   WISDOM TOOTH EXTRACTION     Patient Active Problem List   Diagnosis Date Noted   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   CAD (coronary artery disease) 08/16/2021   DOE (dyspnea on exertion) 08/16/2021   Tremor 04/27/2021   Gait abnormality 04/27/2021   Primary localized osteoarthritis of right knee 05/04/2020   Lumbar adjacent segment disease with spondylolisthesis 02/06/2020   Chest pressure 09/27/2019   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   Morbid obesity ([place]) 05/08/2015   DJD (degenerative joint disease) of knee 07/14/2014   Acute upper respiratory infection 06/12/2014   Cigarette smoker 06/12/2014   Cough 06/12/2014   Primary localized osteoarthritis of left knee    Hyperlipidemia    GERD (gastroesophageal reflux disease)    Back pain    Dyspnea 05/07/2014   Multiple pulmonary nodules 05/07/2014    PCP: Sharilyn Sites MD  REFERRING PROVIDER: Renette Butters, MD   REFERRING DIAG: S/P R ant total hip replacement   THERAPY DIAG:  Pain in right hip  Difficulty in walking, not elsewhere classified  Muscle weakness (generalized)  Rationale for Evaluation and Treatment Rehabilitation  ONSET DATE: 12/07/2021   SUBJECTIVE:   SUBJECTIVE STATEMENT: Doing pretty good. Still using  cane some, but going without in the house. Minimal pain in RT thigh. He is taking tylenol.   PERTINENT HISTORY: Hx R TKA - 2021  PAIN:  Are you having pain? Yes: NPRS scale: 2/10 Pain location: R hip  Pain description: sore Aggravating factors: weightbearing Relieving factors: sitting, rest  PRECAUTIONS: None  WEIGHT BEARING RESTRICTIONS No  FALLS:  Has patient fallen in last 6 months? Yes. Number of falls 2-3  LIVING ENVIRONMENT: Lives with: lives with their family Lives in: House/apartment Stairs: Yes: Internal: 15 steps; on right going up and  External: 2 steps; none Has following equipment at home: Single point cane, Walker - 2 wheeled, and bed side commode  OCCUPATION: Retired  PLOF: [place] be able to walk without AD, work in yard, fish   OBJECTIVE:  PATIENT SURVEYS:  FOTO 35% function  COGNITION:  Overall cognitive status: Within functional limits for tasks assessed     SENSATION: WFL  POSTURE: No Significant postural limitations, rounded shoulders, and forward head  OBSERVATION: slight redness in proximal femoral region, bandage intact  PALPATION: Grossly tender throughout R hip  LOWER EXTREMITY ROM:  R hip decreased due to pain  Active ROM Right eval Left eval  Hip flexion    Hip extension    Hip abduction    Hip adduction    Hip internal rotation    Hip external rotation    Knee flexion    Knee extension    Ankle dorsiflexion    Ankle plantarflexion    Ankle inversion    Ankle eversion     (Blank rows = not tested)  LOWER EXTREMITY MMT:  MMT Right eval Left eval  Hip flexion 4-/5* 5/5  Hip extension    Hip abduction 3-/5 *   Hip adduction    Hip internal rotation    Hip external rotation    Knee flexion 5/5 5/5  Knee extension 5/5 5/5  Ankle dorsiflexion 5/5 5/5  Ankle plantarflexion    Ankle inversion    Ankle eversion     (Blank rows = not tested) * =pain   FUNCTIONAL TESTS:  5 times sit to stand: 14.02 seconds with UE support 2 minute walk test: 115 feet Bed mobility: assist for LE with sit to supine, assist for RLE and trunk with supine to sit due to pain/weakness in RLE  GAIT: Distance walked: 115 feet Assistive device utilized: Walker - 2 wheeled Level of assistance: Modified independence Comments: 2MWT, slow, labored, trunk flexed     TODAY'S TREATMENT: 12/20/21 Nustep lv 4 (seat 11) 5 min for strength and ROM Heel raise x 20 Toe raise x 20 Step up 6 inch 2 x 10 HHA x 2  Step down 6 inch x15 HHA x 2 Sit to stands 2 x 10  Tandem stance 3 x  30" each Clinic ambulation 2 RT in clinic no AD    12/15/2021 Standing //bars Heel raises 2 x 10 Toe raises 2 x 10 Marching x 10 6" step ups x 15 6" lateral step ups x 15 Mini squats chair for goal x 10 TKE's green Tband x 20 Tandem stance x 30 sec each Step navigation over and back x 2 with CGA    12/13/21 Review of HEP and goals Standing // bars Heel raises 2 x 10 Toe raises 2 x 10 Marching x 10 4" step up 2 x 10 4" lateral step ups 2 x 10 Slant board 3 x 30"   Sit  to stand 2 x 5  Supine: SAQ's 2 x 10 Heel slides x 10 Quad sets x 10    12/09/21 Quad set 10 x 10 second holds Glute set 10 x 10 second holds Ankle pumps x 20 Heel slides RLE 2x 10    PATIENT EDUCATION:  Education details: Patient educated on exam findings, POC, scope of PT, HEP. Person educated: Patient Education method: Explanation, Demonstration, and Handouts Education comprehension: verbalized understanding, returned demonstration, verbal cues required, and tactile cues required   HOME EXERCISE PROGRAM: 12/15/2021 Access Code: [email or web address] URL: [email or web address] Date: 12/15/2021 Prepared by: [facility] - Rehab  Exercises - Supine Quadricep Sets  - 3-5 x daily - 7 x weekly - 10 reps - 10 second hold - Gluteal Sets  - 3-5 x daily - 7 x weekly - 10 reps - 10 second hold - Supine Ankle Pumps  - 3-5 x daily - 7 x weekly - 20 reps - Supine Heel Slide  - 3-5 x daily - 7 x weekly - 2 sets - 10 reps - Mini Squat with Counter Support  - 2 x daily - 7 x weekly - 1 sets - 10 reps - Standing Tandem Balance with Counter Support  - 2 x daily - 7 x weekly - 1 sets - 1 reps - 30 sec hold  ASSESSMENT:  CLINICAL IMPRESSION: Patient progressing well to therapy goals. Good static balance. Able to progress to gait with no AD today with good result. Patient cued on heel strike on LT for even stride. No increased pain. Does very well on 6 inch step. Reassess next visit for return to MD 12/22/21. Patient  will continue to benefit from skilled therapy services to reduce remaining deficits and improve functional ability.     OBJECTIVE IMPAIRMENTS Abnormal gait, decreased activity tolerance, decreased balance, decreased endurance, decreased mobility, difficulty walking, decreased ROM, decreased strength, increased muscle spasms, impaired flexibility, improper body mechanics, and pain.   ACTIVITY LIMITATIONS lifting, bending, standing, squatting, sleeping, stairs, transfers, locomotion level, and caring for others  PARTICIPATION LIMITATIONS: meal prep, cleaning, laundry, shopping, community activity, and yard work  PERSONAL FACTORS Time since onset of injury/illness/exacerbation are also affecting patient's functional outcome.   REHAB POTENTIAL: Good  CLINICAL DECISION MAKING: Stable/uncomplicated  EVALUATION COMPLEXITY: Low   GOALS: Goals reviewed with patient? Yes  SHORT TERM GOALS: Target date: 12/30/2021  Patient will be independent with HEP in order to improve functional outcomes. Baseline:  Goal status: ongoing  2.  Patient will report at least 25% improvement in symptoms for improved quality of life. Baseline:  Goal status: ongoing    LONG TERM GOALS: Target date: 01/20/2022  Patient will report at least 75% improvement in symptoms for improved quality of life. Baseline:  Goal status: ongoing  2.  Patient will improve FOTO score by at least 35 points in order to indicate improved tolerance to activity. Baseline: 35% function Goal status: ongoing  3.  Patient will be able to navigate stairs with reciprocal pattern without compensation in order to demonstrate improved LE strength. Baseline: states step too pattern Goal status: ongoing  4.  Patient will be able to ambulate at least 400 feet in 2MWT without AD in order to demonstrate improved tolerance to activity. Baseline: 115 feet with RW Goal status: ongoing  5.  Patient will be able to complete 5x STS in under  11.4 seconds without UE support in order to reduce the risk of falls. Baseline: 14.02 seconds with UE support Goal status: ongoing  6.  Patient will be able to return to all activities unrestricted for improved ability to golf, walk, and participate with family.  Baseline: restricted Goal status: ongoing    PLAN: PT FREQUENCY: 2x/week  PT DURATION: 6 weeks  PLANNED INTERVENTIONS: Therapeutic exercises, Therapeutic activity, Neuromuscular re-education, Balance training, Gait training, Patient/Family education, Joint manipulation, Joint mobilization, Stair training, Orthotic/Fit training, DME instructions, Aquatic Therapy, Dry Needling, Electrical stimulation, Spinal manipulation, Spinal mobilization, Cryotherapy, Moist heat, Compression bandaging, scar mobilization, Splintting, Taping, Traction, Ultrasound, Ionotophoresis '4mg'$ /ml Dexamethasone, and Manual therapy  PLAN FOR NEXT SESSION: Reassess next visit for return to MD 12/22/21  1:46 PM, 12/20/21 Josue Hector PT DPT  Physical Therapist with [facility]  [phone]

## 2021-12-22 ENCOUNTER — Ambulatory Visit (HOSPITAL_COMMUNITY): Payer: Medicare Other | Admitting: Physical Therapy

## 2021-12-22 DIAGNOSIS — M6281 Muscle weakness (generalized): Secondary | ICD-10-CM | POA: Diagnosis not present

## 2021-12-22 DIAGNOSIS — R262 Difficulty in walking, not elsewhere classified: Secondary | ICD-10-CM

## 2021-12-22 DIAGNOSIS — M1611 Unilateral primary osteoarthritis, right hip: Secondary | ICD-10-CM | POA: Diagnosis not present

## 2021-12-22 DIAGNOSIS — M25551 Pain in right hip: Secondary | ICD-10-CM | POA: Diagnosis not present

## 2021-12-22 DIAGNOSIS — R2689 Other abnormalities of gait and mobility: Secondary | ICD-10-CM | POA: Diagnosis not present

## 2021-12-22 NOTE — Therapy (Signed)
OUTPATIENT PHYSICAL THERAPY LOWER EXTREMITY TREATMENT  Patient Name: Patrick Mathews MRN: [id number] DOB:[birth year], 71 y.o., male Today's Date: 12/22/2021  Progress Note Reporting Period 12/09/21 to 7/112/23  See note below for Objective Data and Assessment of Progress/Goals.      PT End of Session - 12/22/21 1353     Visit Number 5    Number of Visits 12    Date for PT Re-Evaluation 01/20/22    Authorization Type Primary Medicare Secondary AETNA    Progress Note Due on Visit 12    PT Start Time 1348    PT Stop Time 1426    PT Time Calculation (min) 38 min    Activity Tolerance Patient tolerated treatment well    Behavior During Therapy WFL for tasks assessed/performed               Past Medical History:  Diagnosis Date   Adjacent segment disease with spinal stenosis    Arthritis    "knees; back" (07/14/2014)   Asthma    CAD (coronary artery disease)    Exertional dyspnea    GERD (gastroesophageal reflux disease)    Hyperlipidemia    Hypertension    Primary localized osteoarthritis of left knee    Primary localized osteoarthritis of right knee 05/04/2020   Seasonal allergies    Past Surgical History:  Procedure Laterality Date   ANTERIOR CERVICAL DECOMP/DISCECTOMY FUSION  1999   Dr Evlyn Kanner SURGERY     BUNIONECTOMY Right 2013   COLONOSCOPY N/A 11/06/2012   Procedure: COLONOSCOPY;  Surgeon: Jamesetta So, MD;  [facility];  Service: Gastroenterology;  Laterality: N/A;   FOOT SURGERY Right 2013   bunionectomy   KNEE ARTHROSCOPY Bilateral 1990's   POSTERIOR LUMBAR FUSION  1985; 2009   Dr Joya Salm   TONSILLECTOMY  1950's   TOTAL HIP ARTHROPLASTY Right 12/07/2021   Procedure: TOTAL HIP ARTHROPLASTY ANTERIOR APPROACH;  Surgeon: Renette Butters, MD;  [facility];  Service: Orthopedics;  Laterality: Right;   TOTAL KNEE ARTHROPLASTY Left 07/14/2014   Procedure: LEFT TOTAL KNEE ARTHROPLASTY;  Surgeon: [name], MD;  Location: [place];   Service: Orthopedics;  Laterality: Left;   TOTAL KNEE ARTHROPLASTY Right 05/11/2020   Procedure: TOTAL KNEE ARTHROPLASTY;  Surgeon: Elsie Saas, MD;  [facility];  Service: Orthopedics;  Laterality: Right;   WISDOM TOOTH EXTRACTION     Patient Active Problem List   Diagnosis Date Noted   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   CAD (coronary artery disease) 08/16/2021   DOE (dyspnea on exertion) 08/16/2021   Tremor 04/27/2021   Gait abnormality 04/27/2021   Primary localized osteoarthritis of right knee 05/04/2020   Lumbar adjacent segment disease with spondylolisthesis 02/06/2020   Chest pressure 09/27/2019   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   Morbid obesity ([place]) 05/08/2015   DJD (degenerative joint disease) of knee 07/14/2014   Acute upper respiratory infection 06/12/2014   Cigarette smoker 06/12/2014   Cough 06/12/2014   Primary localized osteoarthritis of left knee    Hyperlipidemia    GERD (gastroesophageal reflux disease)    Back pain    Dyspnea 05/07/2014   Multiple pulmonary nodules 05/07/2014    PCP: Sharilyn Sites MD  REFERRING PROVIDER: Renette Butters, MD   REFERRING DIAG: S/P R ant total hip replacement   THERAPY DIAG:  Pain in right hip  Difficulty in walking, not elsewhere classified  Muscle weakness (generalized)  Rationale for Evaluation and Treatment Rehabilitation  ONSET DATE: 12/07/2021   SUBJECTIVE:   SUBJECTIVE STATEMENT: Doing well. Reports 50-60% overall improvement since surgery. Still some soreness in RT though "just enough to let you know its there".   PERTINENT HISTORY: Hx R TKA - 2021  PAIN:  Are you having pain? Yes: NPRS scale: 1-2/10 Pain location: R hip  Pain description: sore Aggravating factors: weightbearing Relieving factors: sitting, rest  PRECAUTIONS: None  WEIGHT BEARING RESTRICTIONS No  FALLS:  Has patient fallen in last 6 months? Yes. Number of falls  2-3  LIVING ENVIRONMENT: Lives with: lives with their family Lives in: House/apartment Stairs: Yes: Internal: 15 steps; on right going up and External: 2 steps; none Has following equipment at home: Single point cane, Walker - 2 wheeled, and bed side commode  OCCUPATION: Retired  PLOF: [place] be able to walk without AD, work in yard, fish   OBJECTIVE:  PATIENT SURVEYS:  FOTO 54% function  (was 35%)  COGNITION:  Overall cognitive status: Within functional limits for tasks assessed     SENSATION: WFL  POSTURE: No Significant postural limitations, rounded shoulders, and forward head  OBSERVATION: slight redness in proximal femoral region, bandage intact  PALPATION: Grossly tender throughout R hip  LOWER EXTREMITY ROM:  R hip decreased due to pain  Active ROM Right 12/22/21 Left 12/22/21  Hip flexion 90 95  Hip extension 0 0  Hip abduction    Hip adduction    Hip internal rotation    Hip external rotation    Knee flexion    Knee extension    Ankle dorsiflexion    Ankle plantarflexion    Ankle inversion    Ankle eversion     (Blank rows = not tested)  LOWER EXTREMITY MMT:  MMT Right eval Left eval  Hip flexion 4-/5* 5/5  Hip extension    Hip abduction 3-/5 *   Hip adduction    Hip internal rotation    Hip external rotation    Knee flexion 5/5 5/5  Knee extension 5/5 5/5  Ankle dorsiflexion 5/5 5/5  Ankle plantarflexion    Ankle inversion    Ankle eversion     (Blank rows = not tested) * =pain   FUNCTIONAL TESTS:  5 times sit to stand: 14.02 seconds with UE support 2 minute walk test: 115 feet Bed mobility: assist for LE with sit to supine, assist for RLE and trunk with supine to sit due to pain/weakness in RLE  GAIT: Distance walked: 115 feet Assistive device utilized: Walker - 2 wheeled Level of assistance: Modified independence Comments: 2MWT, slow, labored, trunk flexed     TODAY'S  TREATMENT: 12/22/21 Reassess Nustep 8 min lv 3 (seat 11) FOTO 2MWT 450 feet no AD  5 x STS 10.16 sec no UE Stair training 4 inch 2 RT reciprocal no HHA; 7 inch step no rail, reciprocal 3 RT  Tandem stance 2 x 30" Tandem stance on foam x30" each  Standing hip abduction x20 each  SLS 30 sec on RT; 8 sec on LT     12/20/21 Nustep lv 4 (seat 11) 5 min for strength and ROM Heel raise x 20 Toe raise x 20 Step up 6 inch 2 x 10 HHA x 2  Step down 6 inch x15 HHA x 2 Sit to stands 2 x 10  Tandem stance 3 x 30" each Clinic ambulation 2 RT in clinic no AD    12/15/2021 Standing //bars  Heel raises 2 x 10 Toe raises 2 x 10 Marching x 10 6" step ups x 15 6" lateral step ups x 15 Mini squats chair for goal x 10 TKE's green Tband x 20 Tandem stance x 30 sec each Step navigation over and back x 2 with CGA    12/13/21 Review of HEP and goals Standing // bars Heel raises 2 x 10 Toe raises 2 x 10 Marching x 10 4" step up 2 x 10 4" lateral step ups 2 x 10 Slant board 3 x 30"   Sit to stand 2 x 5  Supine: SAQ's 2 x 10 Heel slides x 10 Quad sets x 10    12/09/21 Quad set 10 x 10 second holds Glute set 10 x 10 second holds Ankle pumps x 20 Heel slides RLE 2x 10    PATIENT EDUCATION:  Education details: Patient educated on exam findings, POC, scope of PT, HEP. Person educated: Patient Education method: Explanation, Demonstration, and Handouts Education comprehension: verbalized understanding, returned demonstration, verbal cues required, and tactile cues required   HOME EXERCISE PROGRAM: 12/15/2021 Access Code: [email or web address] URL: [email or web address] Date: 12/15/2021 Prepared by: [facility] - Rehab  Exercises - Supine Quadricep Sets  - 3-5 x daily - 7 x weekly - 10 reps - 10 second hold - Gluteal Sets  - 3-5 x daily - 7 x weekly - 10 reps - 10 second hold - Supine Ankle Pumps  - 3-5 x daily - 7 x weekly - 20 reps - Supine Heel Slide  - 3-5 x daily - 7 x weekly -  2 sets - 10 reps - Mini Squat with Counter Support  - 2 x daily - 7 x weekly - 1 sets - 10 reps - Standing Tandem Balance with Counter Support  - 2 x daily - 7 x weekly - 1 sets - 1 reps - 30 sec hold  ASSESSMENT:  CLINICAL IMPRESSION: Patient is making very good progress. Significant improvement in gait and functional mobility. Very good static balance. Able to ambulate on level ground and stairs with no AD. He remains limited by slight pain, stiffness and mild strength deficits which continue to impact functional level. Patient will continue to benefit from skilled therapy services to address remaining deficits for decreased pain and improved LOF with ADLs.   OBJECTIVE IMPAIRMENTS Abnormal gait, decreased activity tolerance, decreased balance, decreased endurance, decreased mobility, difficulty walking, decreased ROM, decreased strength, increased muscle spasms, impaired flexibility, improper body mechanics, and pain.   ACTIVITY LIMITATIONS lifting, bending, standing, squatting, sleeping, stairs, transfers, locomotion level, and caring for others  PARTICIPATION LIMITATIONS: meal prep, cleaning, laundry, shopping, community activity, and yard work  PERSONAL FACTORS Time since onset of injury/illness/exacerbation are also affecting patient's functional outcome.   REHAB POTENTIAL: Good  CLINICAL DECISION MAKING: Stable/uncomplicated  EVALUATION COMPLEXITY: Low   GOALS: Goals reviewed with patient? Yes  SHORT TERM GOALS: Target date: 12/30/2021  Patient will be independent with HEP in order to improve functional outcomes. Baseline:  Goal status: MET  2.  Patient will report at least 25% improvement in symptoms for improved quality of life. Baseline:  Goal status: MET    LONG TERM GOALS: Target date: 01/20/2022  Patient will report at least 75% improvement in symptoms for improved quality of life. Baseline: 50-60%  Goal status: ongoing  2.  Patient will improve FOTO score by  at least 35 points in order to indicate improved tolerance to activity. Baseline: 19% increase Goal status: ongoing  3.  Patient will be able to navigate stairs with reciprocal pattern without compensation in order to demonstrate improved LE strength. Baseline: can do 7 inch step reciprocal, no AD, no rail  Goal status: MET  4.  Patient will be able to ambulate at least 400 feet in 2MWT without AD in order to demonstrate improved tolerance to activity. Baseline: 450 feet no AD Goal status: MET  5.  Patient will be able to complete 5x STS in under 11.4 seconds without UE support in order to reduce the risk of falls. Baseline: 10.16 seconds with no UE support  Goal status: MET  6.  Patient will be able to return to all activities unrestricted for improved ability to golf, walk, and participate with family.  Baseline: restricted Goal status: ongoing    PLAN: PT FREQUENCY: 2x/week  PT DURATION: 6 weeks  PLANNED INTERVENTIONS: Therapeutic exercises, Therapeutic activity, Neuromuscular re-education, Balance training, Gait training, Patient/Family education, Joint manipulation, Joint mobilization, Stair training, Orthotic/Fit training, DME instructions, Aquatic Therapy, Dry Needling, Electrical stimulation, Spinal manipulation, Spinal mobilization, Cryotherapy, Moist heat, Compression bandaging, scar mobilization, Splintting, Taping, Traction, Ultrasound, Ionotophoresis 84m/ml Dexamethasone, and Manual therapy  PLAN FOR NEXT SESSION: Continue work on hip strengthening, gait and balance as tolerated   2:27 PM, 12/22/21 CJosue HectorPT DPT  [facility] ([phone]

## 2021-12-27 ENCOUNTER — Encounter (HOSPITAL_COMMUNITY): Payer: Self-pay | Admitting: Physical Therapy

## 2021-12-27 ENCOUNTER — Ambulatory Visit (HOSPITAL_COMMUNITY): Payer: Medicare Other | Admitting: Physical Therapy

## 2021-12-27 DIAGNOSIS — R262 Difficulty in walking, not elsewhere classified: Secondary | ICD-10-CM

## 2021-12-27 DIAGNOSIS — M6281 Muscle weakness (generalized): Secondary | ICD-10-CM

## 2021-12-27 DIAGNOSIS — M25551 Pain in right hip: Secondary | ICD-10-CM | POA: Diagnosis not present

## 2021-12-27 DIAGNOSIS — R2689 Other abnormalities of gait and mobility: Secondary | ICD-10-CM | POA: Diagnosis not present

## 2021-12-27 NOTE — Therapy (Signed)
OUTPATIENT PHYSICAL THERAPY LOWER EXTREMITY TREATMENT  Patient Name: Patrick Mathews MRN: [id number] DOB:[birth year], 71 y.o., male Today's Date: 12/27/2021   See note below for Objective Data and Assessment of Progress/Goals.      PT End of Session - 12/27/21 1304     Visit Number 6    Number of Visits 12    Date for PT Re-Evaluation 01/20/22    Authorization Type Primary Medicare Secondary AETNA    Progress Note Due on Visit 12    PT Start Time 1304    PT Stop Time 1343    PT Time Calculation (min) 39 min    Activity Tolerance Patient tolerated treatment well    Behavior During Therapy WFL for tasks assessed/performed               Past Medical History:  Diagnosis Date   Adjacent segment disease with spinal stenosis    Arthritis    "knees; back" (07/14/2014)   Asthma    CAD (coronary artery disease)    Exertional dyspnea    GERD (gastroesophageal reflux disease)    Hyperlipidemia    Hypertension    Primary localized osteoarthritis of left knee    Primary localized osteoarthritis of right knee 05/04/2020   Seasonal allergies    Past Surgical History:  Procedure Laterality Date   ANTERIOR CERVICAL DECOMP/DISCECTOMY FUSION  1999   Dr Evlyn Kanner SURGERY     BUNIONECTOMY Right 2013   COLONOSCOPY N/A 11/06/2012   Procedure: COLONOSCOPY;  Surgeon: Jamesetta So, MD;  [facility]: [facility];  Service: Gastroenterology;  Laterality: N/A;   FOOT SURGERY Right 2013   bunionectomy   KNEE ARTHROSCOPY Bilateral 1990's   POSTERIOR LUMBAR FUSION  1985; 2009   Dr Joya Salm   TONSILLECTOMY  1950's   TOTAL HIP ARTHROPLASTY Right 12/07/2021   Procedure: TOTAL HIP ARTHROPLASTY ANTERIOR APPROACH;  Surgeon: Renette Butters, MD;  Location: [facility];  Service: Orthopedics;  Laterality: Right;   TOTAL KNEE ARTHROPLASTY Left 07/14/2014   Procedure: LEFT TOTAL KNEE ARTHROPLASTY;  Surgeon: [name], MD;  Location: [place];  Service: Orthopedics;  Laterality: Left;   TOTAL  KNEE ARTHROPLASTY Right 05/11/2020   Procedure: TOTAL KNEE ARTHROPLASTY;  Surgeon: Elsie Saas, MD;  Location: [facility];  Service: Orthopedics;  Laterality: Right;   WISDOM TOOTH EXTRACTION     Patient Active Problem List   Diagnosis Date Noted   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   CAD (coronary artery disease) 08/16/2021   DOE (dyspnea on exertion) 08/16/2021   Tremor 04/27/2021   Gait abnormality 04/27/2021   Primary localized osteoarthritis of right knee 05/04/2020   Lumbar adjacent segment disease with spondylolisthesis 02/06/2020   Chest pressure 09/27/2019   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   Morbid obesity ([place]) 05/08/2015   DJD (degenerative joint disease) of knee 07/14/2014   Acute upper respiratory infection 06/12/2014   Cigarette smoker 06/12/2014   Cough 06/12/2014   Primary localized osteoarthritis of left knee    Hyperlipidemia    GERD (gastroesophageal reflux disease)    Back pain    Dyspnea 05/07/2014   Multiple pulmonary nodules 05/07/2014    PCP: Sharilyn Sites MD  REFERRING PROVIDER: Renette Butters, MD   REFERRING DIAG: S/P R ant total hip replacement   THERAPY DIAG:  Pain in right hip  Difficulty in walking, not elsewhere classified  Muscle weakness (generalized)  Rationale for Evaluation and Treatment Rehabilitation  ONSET DATE: 12/07/2021   SUBJECTIVE:   SUBJECTIVE STATEMENT: Patient had follow up with MD and had updated xrays. Everything looked good.   PERTINENT HISTORY: Hx R TKA - 2021  PAIN:  Are you having pain? Yes: NPRS scale: 1-2/10 Pain location: R hip  Pain description: sore Aggravating factors: weightbearing Relieving factors: sitting, rest  PRECAUTIONS: None  WEIGHT BEARING RESTRICTIONS No  FALLS:  Has patient fallen in last 6 months? Yes. Number of falls 2-3  LIVING ENVIRONMENT: Lives with: lives with their family Lives in: House/apartment Stairs: Yes: Internal:  15 steps; on right going up and External: 2 steps; none Has following equipment at home: Single point cane, Walker - 2 wheeled, and bed side commode  OCCUPATION: Retired  PLOF: [place] be able to walk without AD, work in yard, fish   OBJECTIVE:  PATIENT SURVEYS:  FOTO 54% function  (was 35%)  COGNITION:  Overall cognitive status: Within functional limits for tasks assessed     SENSATION: WFL  POSTURE: No Significant postural limitations, rounded shoulders, and forward head  OBSERVATION: slight redness in proximal femoral region, bandage intact  PALPATION: Grossly tender throughout R hip  LOWER EXTREMITY ROM:  R hip decreased due to pain  Active ROM Right 12/22/21 Left 12/22/21  Hip flexion 90 95  Hip extension 0 0  Hip abduction    Hip adduction    Hip internal rotation    Hip external rotation    Knee flexion    Knee extension    Ankle dorsiflexion    Ankle plantarflexion    Ankle inversion    Ankle eversion     (Blank rows = not tested)  LOWER EXTREMITY MMT:  MMT Right eval Left eval  Hip flexion 4-/5* 5/5  Hip extension    Hip abduction 3-/5 *   Hip adduction    Hip internal rotation    Hip external rotation    Knee flexion 5/5 5/5  Knee extension 5/5 5/5  Ankle dorsiflexion 5/5 5/5  Ankle plantarflexion    Ankle inversion    Ankle eversion     (Blank rows = not tested) * =pain   FUNCTIONAL TESTS:  5 times sit to stand: 14.02 seconds with UE support 2 minute walk test: 115 feet Bed mobility: assist for LE with sit to supine, assist for RLE and trunk with supine to sit due to pain/weakness in RLE  GAIT: Distance walked: 115 feet Assistive device utilized: Walker - 2 wheeled Level of assistance: Modified independence Comments: 2MWT, slow, labored, trunk flexed     TODAY'S TREATMENT: 12/27/21 Nustep 5 min lv 4 (seat 11) Heel raise x20 Toe rase x20 Stairs 7 inch single rail reciprocal 5 RT Lunge on step 2 x 10  each Band sidestepping RTB 5 RT in bars  SLS 3 x 10" SLS with vectors 5 x 5"  Retro walk 4 plates  S06    08/12/58 Reassess Nustep 8 min lv 3 (seat 11) FOTO 2MWT 450 feet no AD  5 x STS 10.16 sec no UE Stair training 4 inch 2 RT reciprocal no HHA; 7 inch step no rail, reciprocal 3 RT  Tandem stance 2 x 30" Tandem stance on foam x30" each  Standing hip abduction x20 each  SLS 30 sec on RT; 8 sec on LT      PATIENT EDUCATION:  Education details: Patient educated on exam findings, POC, scope of PT, HEP. Person educated: Patient Education method: Explanation, Demonstration, and Handouts  Education comprehension: verbalized understanding, returned demonstration, verbal cues required, and tactile cues required   HOME EXERCISE PROGRAM: 12/15/2021 Access Code: [email or web address] URL: [email or web address] Date: 12/15/2021 Prepared by: [facility] - [facility]  Exercises - Supine Quadricep Sets  - 3-5 x daily - 7 x weekly - 10 reps - 10 second hold - Gluteal Sets  - 3-5 x daily - 7 x weekly - 10 reps - 10 second hold - Supine Ankle Pumps  - 3-5 x daily - 7 x weekly - 20 reps - Supine Heel Slide  - 3-5 x daily - 7 x weekly - 2 sets - 10 reps - Mini Squat with Counter Support  - 2 x daily - 7 x weekly - 1 sets - 10 reps - Standing Tandem Balance with Counter Support  - 2 x daily - 7 x weekly - 1 sets - 1 reps - 30 sec hold  ASSESSMENT:  CLINICAL IMPRESSION: Patient tolerated session well. Progressed LE strength with adding band sidestepping and step lunge. Also progressed standing balance with SLS with vectors. Patient did well with this, just limited by LE fatigue from single leg stance. Will continue to progress functional strength and dynamic balance activity for improved LOF and ability to perform ADLs.   OBJECTIVE IMPAIRMENTS Abnormal gait, decreased activity tolerance, decreased balance, decreased endurance, decreased mobility, difficulty walking, decreased ROM, decreased strength,  increased muscle spasms, impaired flexibility, improper body mechanics, and pain.   ACTIVITY LIMITATIONS lifting, bending, standing, squatting, sleeping, stairs, transfers, locomotion level, and caring for others  PARTICIPATION LIMITATIONS: meal prep, cleaning, laundry, shopping, community activity, and yard work  PERSONAL FACTORS Time since onset of injury/illness/exacerbation are also affecting patient's functional outcome.   REHAB POTENTIAL: Good  CLINICAL DECISION MAKING: Stable/uncomplicated  EVALUATION COMPLEXITY: Low   GOALS: Goals reviewed with patient? Yes  SHORT TERM GOALS: Target date: 12/30/2021  Patient will be independent with HEP in order to improve functional outcomes. Baseline:  Goal status: MET  2.  Patient will report at least 25% improvement in symptoms for improved quality of life. Baseline:  Goal status: MET    LONG TERM GOALS: Target date: 01/20/2022  Patient will report at least 75% improvement in symptoms for improved quality of life. Baseline: 50-60%  Goal status: ongoing  2.  Patient will improve FOTO score by at least 35 points in order to indicate improved tolerance to activity. Baseline: 19% increase Goal status: ongoing  3.  Patient will be able to navigate stairs with reciprocal pattern without compensation in order to demonstrate improved LE strength. Baseline: can do 7 inch step reciprocal, no AD, no rail  Goal status: MET  4.  Patient will be able to ambulate at least 400 feet in 2MWT without AD in order to demonstrate improved tolerance to activity. Baseline: 450 feet no AD Goal status: MET  5.  Patient will be able to complete 5x STS in under 11.4 seconds without UE support in order to reduce the risk of falls. Baseline: 10.16 seconds with no UE support  Goal status: MET  6.  Patient will be able to return to all activities unrestricted for improved ability to golf, walk, and participate with family.  Baseline: restricted Goal  status: ongoing    PLAN: PT FREQUENCY: 2x/week  PT DURATION: 6 weeks  PLANNED INTERVENTIONS: Therapeutic exercises, Therapeutic activity, Neuromuscular re-education, Balance training, Gait training, Patient/Family education, Joint manipulation, Joint mobilization, Stair training, Orthotic/Fit training, DME instructions, Aquatic Therapy, Dry Needling, Electrical stimulation, Spinal manipulation, Spinal mobilization, Cryotherapy,  Moist heat, Compression bandaging, scar mobilization, Splintting, Taping, Traction, Ultrasound, Ionotophoresis 5m/ml Dexamethasone, and Manual therapy  PLAN FOR NEXT SESSION: Continue work on hip strengthening, gait and balance as tolerated   1:44 PM, 12/27/21 CJosue HectorPT DPT  Physical [facility]rap[facility] [phone]

## 2021-12-29 ENCOUNTER — Ambulatory Visit (HOSPITAL_COMMUNITY): Payer: Medicare Other

## 2021-12-29 ENCOUNTER — Encounter (HOSPITAL_COMMUNITY): Payer: Self-pay

## 2021-12-29 DIAGNOSIS — R262 Difficulty in walking, not elsewhere classified: Secondary | ICD-10-CM

## 2021-12-29 DIAGNOSIS — M25551 Pain in right hip: Secondary | ICD-10-CM

## 2021-12-29 DIAGNOSIS — M6281 Muscle weakness (generalized): Secondary | ICD-10-CM

## 2021-12-29 DIAGNOSIS — R2689 Other abnormalities of gait and mobility: Secondary | ICD-10-CM | POA: Diagnosis not present

## 2021-12-29 NOTE — Therapy (Signed)
OUTPATIENT PHYSICAL THERAPY LOWER EXTREMITY TREATMENT  Patient Name: Patrick Mathews MRN: [id number] DOB:[birth year], 71 y.o., male Today's Date: 12/29/2021    PT End of Session - 12/29/21 1309     Visit Number 7    Number of Visits 12    Date for PT Re-Evaluation 01/20/22    Authorization Type Primary Medicare Secondary AETNA    Progress Note Due on Visit 10    PT Start Time 1303    PT Stop Time 1343    PT Time Calculation (min) 40 min    Activity Tolerance Patient tolerated treatment well    Behavior During Therapy [facility] for tasks assessed/performed                Past Medical History:  Diagnosis Date   Adjacent segment disease with spinal stenosis    Arthritis    "knees; back" (07/14/2014)   Asthma    CAD (coronary artery disease)    Exertional dyspnea    GERD (gastroesophageal reflux disease)    Hyperlipidemia    Hypertension    Primary localized osteoarthritis of left knee    Primary localized osteoarthritis of right knee 05/04/2020   Seasonal allergies    Past Surgical History:  Procedure Laterality Date   ANTERIOR CERVICAL DECOMP/DISCECTOMY FUSION  1999   Dr Evlyn Kanner SURGERY     BUNIONECTOMY Right 2013   COLONOSCOPY N/A 11/06/2012   Procedure: COLONOSCOPY;  Surgeon: Jamesetta So, MD;  [facility]: [facility];  Service: Gastroenterology;  Laterality: N/A;   FOOT SURGERY Right 2013   bunionectomy   KNEE ARTHROSCOPY Bilateral 1990's   POSTERIOR LUMBAR FUSION  1985; 2009   Dr Joya Salm   TONSILLECTOMY  1950's   TOTAL HIP ARTHROPLASTY Right 12/07/2021   Procedure: TOTAL HIP ARTHROPLASTY ANTERIOR APPROACH;  Surgeon: Renette Butters, MD;  Location: [facility];  Service: Orthopedics;  Laterality: Right;   TOTAL KNEE ARTHROPLASTY Left 07/14/2014   Procedure: LEFT TOTAL KNEE ARTHROPLASTY;  Surgeon: [name], MD;  Location: [place];  Service: Orthopedics;  Laterality: Left;   TOTAL KNEE ARTHROPLASTY Right 05/11/2020   Procedure: TOTAL KNEE ARTHROPLASTY;   Surgeon: Elsie Saas, MD;  Location: [facility];  Service: Orthopedics;  Laterality: Right;   WISDOM TOOTH EXTRACTION     Patient Active Problem List   Diagnosis Date Noted   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   CAD (coronary artery disease) 08/16/2021   DOE (dyspnea on exertion) 08/16/2021   Tremor 04/27/2021   Gait abnormality 04/27/2021   Primary localized osteoarthritis of right knee 05/04/2020   Lumbar adjacent segment disease with spondylolisthesis 02/06/2020   Chest pressure 09/27/2019   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   Morbid obesity ([place]) 05/08/2015   DJD (degenerative joint disease) of knee 07/14/2014   Acute upper respiratory infection 06/12/2014   Cigarette smoker 06/12/2014   Cough 06/12/2014   Primary localized osteoarthritis of left knee    Hyperlipidemia    GERD (gastroesophageal reflux disease)    Back pain    Dyspnea 05/07/2014   Multiple pulmonary nodules 05/07/2014    PCP: Sharilyn Sites MD  REFERRING PROVIDER: Renette Butters, MD  Next apt 01/18/22  REFERRING DIAG: S/P R ant total hip replacement   THERAPY DIAG:  Pain in right hip  Difficulty in walking, not elsewhere classified  Muscle weakness (generalized)  Other abnormalities of gait and mobility  Rationale for Evaluation and Treatment Rehabilitation  ONSET DATE: 12/07/2021  SUBJECTIVE:   SUBJECTIVE STATEMENT: Pt stated he is feeling good today, no reports of pain.  Does have some tightness in anterior thigh, no reports of pain.  Reports main difficulty putting on socks and shoes and swelling present Bil ankles.  PERTINENT HISTORY: Hx R TKA - 2021  PAIN:  Are you having pain? Yes: NPRS scale: 1-2/10 Pain location: R hip  Pain description: sore Aggravating factors: weightbearing Relieving factors: sitting, rest  PRECAUTIONS: None  WEIGHT BEARING RESTRICTIONS No  FALLS:  Has patient fallen in last 6 months? Yes. Number of falls  2-3  LIVING ENVIRONMENT: Lives with: lives with their family Lives in: House/apartment Stairs: Yes: Internal: 15 steps; on right going up and External: 2 steps; none Has following equipment at home: Single point cane, Walker - 2 wheeled, and bed side commode  OCCUPATION: Retired  PLOF: Independent  PATIENT GOALS be able to walk without AD, work in yard, fish   OBJECTIVE:  PATIENT SURVEYS:  FOTO 54% function  (was 35%)  COGNITION:  Overall cognitive status: Within functional limits for tasks assessed     SENSATION: WFL  POSTURE: No Significant postural limitations, rounded shoulders, and forward head  OBSERVATION: slight redness in proximal femoral region, bandage intact  PALPATION: Grossly tender throughout R hip  LOWER EXTREMITY ROM:  R hip decreased due to pain  Active ROM Right 12/22/21 Left 12/22/21  Hip flexion 90 95  Hip extension 0 0  Hip abduction    Hip adduction    Hip internal rotation    Hip external rotation    Knee flexion    Knee extension    Ankle dorsiflexion    Ankle plantarflexion    Ankle inversion    Ankle eversion     (Blank rows = not tested)  LOWER EXTREMITY MMT:  MMT Right eval Left eval  Hip flexion 4-/5* 5/5  Hip extension    Hip abduction 3-/5 *   Hip adduction    Hip internal rotation    Hip external rotation    Knee flexion 5/5 5/5  Knee extension 5/5 5/5  Ankle dorsiflexion 5/5 5/5  Ankle plantarflexion    Ankle inversion    Ankle eversion     (Blank rows = not tested) * =pain   FUNCTIONAL TESTS:  5 times sit to stand: 14.02 seconds with UE support 2 minute walk test: 115 feet Bed mobility: assist for LE with sit to supine, assist for RLE and trunk with supine to sit due to pain/weakness in RLE  GAIT: Distance walked: 115 feet Assistive device utilized: Walker - 2 wheeled Level of assistance: Modified independence Comments: 2MWT, slow, labored, trunk flexed     TODAY'S TREATMENT: 12/29/21 2MWT 480f no  AD Heel raise 20x incline slope Toe raises 20x decline slope Squat 2x 10 no HHA required  Sidestep 3Pl 5RT [employer] Retro walk 4Pl [employer] 3D hip excursion 10x  Vector stance 3x 5" Thigh stretch with forward lunges 12in step 5x 20 Measurement and ETI handout for knee high compression socks.  12/27/21 Nustep 5 min lv 4 (seat 11) Heel raise x20 Toe rase x20 Stairs 7 inch single rail reciprocal 5 RT Lunge on step 2 x 10 each Band sidestepping RTB 5 RT in bars  SLS 3 x 10" SLS with vectors 5 x 5"  Retro walk 4 plates  xL38   71/01/75Reassess Nustep 8 min lv 3 (seat 11) FOTO 2MWT 450 feet no AD  5 x STS 10.16 sec  no UE Stair training 4 inch 2 RT reciprocal no HHA; 7 inch step no rail, reciprocal 3 RT  Tandem stance 2 x 30" Tandem stance on foam x30" each  Standing hip abduction x20 each  SLS 30 sec on RT; 8 sec on LT      PATIENT EDUCATION:  Education details: Patient educated on exam findings, POC, scope of PT, HEP. Person educated: Patient Education method: Explanation, Demonstration, and Handouts Education comprehension: verbalized understanding, returned demonstration, verbal cues required, and tactile cues required   HOME EXERCISE PROGRAM: 12/15/2021 Access Code: [email or web address] URL: [email or web address] Date: 12/15/2021 Prepared by: [facility] - [facility]  Exercises - Supine Quadricep Sets  - 3-5 x daily - 7 x weekly - 10 reps - 10 second hold - Gluteal Sets  - 3-5 x daily - 7 x weekly - 10 reps - 10 second hold - Supine Ankle Pumps  - 3-5 x daily - 7 x weekly - 20 reps - Supine Heel Slide  - 3-5 x daily - 7 x weekly - 2 sets - 10 reps - Mini Squat with Counter Support  - 2 x daily - 7 x weekly - 1 sets - 10 reps - Standing Tandem Balance with Counter Support  - 2 x daily - 7 x weekly - 1 sets - 1 reps - 30 sec hold  12/29/21:  ETI paperwork for compression garments, 3D hip excursion   ASSESSMENT:  CLINICAL IMPRESSION: Added mobility exercises to assist  with putting on socks and shoes.  Pt educated on benefits with compression garments to assist with swelling, measurements complete and given ETI paperwork to get compression garments. Session focus with LE strengthening.  Pt tolerated well with no reports of increased pain through session.  Added 3D hip excursion to HEP.  OBJECTIVE IMPAIRMENTS Abnormal gait, decreased activity tolerance, decreased balance, decreased endurance, decreased mobility, difficulty walking, decreased ROM, decreased strength, increased muscle spasms, impaired flexibility, improper body mechanics, and pain.   ACTIVITY LIMITATIONS lifting, bending, standing, squatting, sleeping, stairs, transfers, locomotion level, and caring for others  PARTICIPATION LIMITATIONS: meal prep, cleaning, laundry, shopping, community activity, and yard work  PERSONAL FACTORS Time since onset of injury/illness/exacerbation are also affecting patient's functional outcome.   REHAB POTENTIAL: Good  CLINICAL DECISION MAKING: Stable/uncomplicated  EVALUATION COMPLEXITY: Low   GOALS: Goals reviewed with patient? Yes  SHORT TERM GOALS: Target date: 12/30/2021  Patient will be independent with HEP in order to improve functional outcomes. Baseline:  Goal status: MET  2.  Patient will report at least 25% improvement in symptoms for improved quality of life. Baseline:  Goal status: MET    LONG TERM GOALS: Target date: 01/20/2022  Patient will report at least 75% improvement in symptoms for improved quality of life. Baseline: 50-60%  Goal status: ongoing  2.  Patient will improve FOTO score by at least 35 points in order to indicate improved tolerance to activity. Baseline: 19% increase Goal status: ongoing  3.  Patient will be able to navigate stairs with reciprocal pattern without compensation in order to demonstrate improved LE strength. Baseline: can do 7 inch step reciprocal, no AD, no rail  Goal status: MET  4.  Patient will be  able to ambulate at least 400 feet in 2MWT without AD in order to demonstrate improved tolerance to activity. Baseline: 450 feet no AD Goal status: MET  5.  Patient will be able to complete 5x STS in under 11.4 seconds without UE support in order to reduce the risk  of falls. Baseline: 10.16 seconds with no UE support  Goal status: MET  6.  Patient will be able to return to all activities unrestricted for improved ability to golf, walk, and participate with family.  Baseline: restricted Goal status: ongoing    PLAN: PT FREQUENCY: 2x/week  PT DURATION: 6 weeks  PLANNED INTERVENTIONS: Therapeutic exercises, Therapeutic activity, Neuromuscular re-education, Balance training, Gait training, Patient/Family education, Joint manipulation, Joint mobilization, Stair training, Orthotic/Fit training, DME instructions, Aquatic Therapy, Dry Needling, Electrical stimulation, Spinal manipulation, Spinal mobilization, Cryotherapy, Moist heat, Compression bandaging, scar mobilization, Splintting, Taping, Traction, Ultrasound, Ionotophoresis 23m/ml Dexamethasone, and Manual therapy  PLAN FOR NEXT SESSION: Continue work on hip strengthening, gait and balance as tolerated.  Add leg press.  CIhor Austin LPTA/CLT; CDelana [phone] 5:42 PM, 12/29/21

## 2022-01-04 ENCOUNTER — Ambulatory Visit (HOSPITAL_COMMUNITY): Payer: Medicare Other

## 2022-01-04 ENCOUNTER — Encounter (HOSPITAL_COMMUNITY): Payer: Self-pay

## 2022-01-04 DIAGNOSIS — R2689 Other abnormalities of gait and mobility: Secondary | ICD-10-CM | POA: Diagnosis not present

## 2022-01-04 DIAGNOSIS — M25551 Pain in right hip: Secondary | ICD-10-CM | POA: Diagnosis not present

## 2022-01-04 DIAGNOSIS — M6281 Muscle weakness (generalized): Secondary | ICD-10-CM | POA: Diagnosis not present

## 2022-01-04 DIAGNOSIS — R262 Difficulty in walking, not elsewhere classified: Secondary | ICD-10-CM

## 2022-01-04 NOTE — Therapy (Signed)
OUTPATIENT PHYSICAL THERAPY LOWER EXTREMITY TREATMENT  Patient Name: Patrick Mathews MRN: [id number] DOB:[birth year], 71 y.o., male Today's Date: 01/04/2022    PT End of Session - 01/04/22 1520     Visit Number 8    Number of Visits 12    Date for PT Re-Evaluation 01/20/22    Authorization Type Primary Medicare Secondary AETNA    Progress Note Due on Visit 10    PT Start Time 1520    PT Stop Time 1558    PT Time Calculation (min) 38 min    Activity Tolerance Patient tolerated treatment well    Behavior During Therapy WFL for tasks assessed/performed                Past Medical History:  Diagnosis Date   Adjacent segment disease with spinal stenosis    Arthritis    "knees; back" (07/14/2014)   Asthma    CAD (coronary artery disease)    Exertional dyspnea    GERD (gastroesophageal reflux disease)    Hyperlipidemia    Hypertension    Primary localized osteoarthritis of left knee    Primary localized osteoarthritis of right knee 05/04/2020   Seasonal allergies    Past Surgical History:  Procedure Laterality Date   ANTERIOR CERVICAL DECOMP/DISCECTOMY FUSION  1999   Dr Evlyn Kanner SURGERY     BUNIONECTOMY Right 2013   COLONOSCOPY N/A 11/06/2012   Procedure: COLONOSCOPY;  Surgeon: Jamesetta So, MD;  [facility]: [facility];  Service: Gastroenterology;  Laterality: N/A;   FOOT SURGERY Right 2013   bunionectomy   KNEE ARTHROSCOPY Bilateral 1990's   POSTERIOR LUMBAR FUSION  1985; 2009   Dr Joya Salm   TONSILLECTOMY  1950's   TOTAL HIP ARTHROPLASTY Right 12/07/2021   Procedure: TOTAL HIP ARTHROPLASTY ANTERIOR APPROACH;  Surgeon: Renette Butters, MD;  Location: [facility];  Service: Orthopedics;  Laterality: Right;   TOTAL KNEE ARTHROPLASTY Left 07/14/2014   Procedure: LEFT TOTAL KNEE ARTHROPLASTY;  Surgeon: [name], MD;  Location: [place];  Service: Orthopedics;  Laterality: Left;   TOTAL KNEE ARTHROPLASTY Right 05/11/2020   Procedure: TOTAL KNEE ARTHROPLASTY;   Surgeon: Elsie Saas, MD;  Location: [facility];  Service: Orthopedics;  Laterality: Right;   WISDOM TOOTH EXTRACTION     Patient Active Problem List   Diagnosis Date Noted   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   CAD (coronary artery disease) 08/16/2021   DOE (dyspnea on exertion) 08/16/2021   Tremor 04/27/2021   Gait abnormality 04/27/2021   Primary localized osteoarthritis of right knee 05/04/2020   Lumbar adjacent segment disease with spondylolisthesis 02/06/2020   Chest pressure 09/27/2019   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   Morbid obesity ([place]) 05/08/2015   DJD (degenerative joint disease) of knee 07/14/2014   Acute upper respiratory infection 06/12/2014   Cigarette smoker 06/12/2014   Cough 06/12/2014   Primary localized osteoarthritis of left knee    Hyperlipidemia    GERD (gastroesophageal reflux disease)    Back pain    Dyspnea 05/07/2014   Multiple pulmonary nodules 05/07/2014    PCP: Sharilyn Sites MD  REFERRING PROVIDER: Renette Butters, MD  Next apt 01/18/22  REFERRING DIAG: S/P R ant total hip replacement   THERAPY DIAG:  Pain in right hip  Difficulty in walking, not elsewhere classified  Muscle weakness (generalized)  Other abnormalities of gait and mobility  Rationale for Evaluation and Treatment Rehabilitation  ONSET DATE: 12/07/2021  SUBJECTIVE:   SUBJECTIVE STATEMENT: Pt stated main difficulty with stiffness with STS.  Reports increased ease putting on socks and shoes since last session.  Stated he would to return to playing golf.  Reports he is working on remodeling house, has 4 layers of flooring to remove.  PERTINENT HISTORY: Hx R TKA - 2021  PAIN:  Are you having pain? Yes: NPRS scale: 1-2/10 Pain location: R hip  Pain description: sore Aggravating factors: weightbearing Relieving factors: sitting, rest  PRECAUTIONS: None  WEIGHT BEARING RESTRICTIONS No  FALLS:  Has patient fallen  in last 6 months? Yes. Number of falls 2-3  LIVING ENVIRONMENT: Lives with: lives with their family Lives in: House/apartment Stairs: Yes: Internal: 15 steps; on right going up and External: 2 steps; none Has following equipment at home: Single point cane, Walker - 2 wheeled, and bed side commode  OCCUPATION: Retired  PLOF: Independent  PATIENT GOALS be able to walk without AD, work in yard, fish   OBJECTIVE:  PATIENT SURVEYS:  FOTO 54% function  (was 35%)  COGNITION:  Overall cognitive status: Within functional limits for tasks assessed     SENSATION: WFL  POSTURE: No Significant postural limitations, rounded shoulders, and forward head  OBSERVATION: slight redness in proximal femoral region, bandage intact  PALPATION: Grossly tender throughout R hip  LOWER EXTREMITY ROM:  R hip decreased due to pain  Active ROM Right 12/22/21 Left 12/22/21  Hip flexion 90 95  Hip extension 0 0  Hip abduction    Hip adduction    Hip internal rotation    Hip external rotation    Knee flexion    Knee extension    Ankle dorsiflexion    Ankle plantarflexion    Ankle inversion    Ankle eversion     (Blank rows = not tested)  LOWER EXTREMITY MMT:  MMT Right eval Left eval  Hip flexion 4-/5* 5/5  Hip extension    Hip abduction 3-/5 *   Hip adduction    Hip internal rotation    Hip external rotation    Knee flexion 5/5 5/5  Knee extension 5/5 5/5  Ankle dorsiflexion 5/5 5/5  Ankle plantarflexion    Ankle inversion    Ankle eversion     (Blank rows = not tested) * =pain   FUNCTIONAL TESTS:  5 times sit to stand: 14.02 seconds with UE support 2 minute walk test: 115 feet Bed mobility: assist for LE with sit to supine, assist for RLE and trunk with supine to sit due to pain/weakness in RLE  GAIT: Distance walked: 115 feet Assistive device utilized: Walker - 2 wheeled Level of assistance: Modified independence Comments: 2MWT, slow, labored, trunk flexed      TODAY'S TREATMENT: 01/04/22: 2MWT no AD, good mechanics 3D hip excursion 15x (squat front of chair, no UE support) Golf swing with long PCP pipe to improve rotation  Vector stance with 1 finger or no A 5x5"  Leg press 3sets x 10 reps 4Pl  Retro walk 4Pl Bodycraft 5RT  Sidestep 3Pl 5RT Bodycraft  Tandem stance on foam 2x 30"  12/29/21 2MWT 49f no AD Heel raise 20x incline slope Toe raises 20x decline slope Squat 2x 10 no HHA required  Sidestep 3Pl 5RT Bodycraft Retro walk 4Pl Bodycraft 3D hip excursion 10x  Vector stance 3x 5" Thigh stretch with forward lunges 12in step 5x 20 Measurement and ETI handout for knee high compression socks.  12/27/21 Nustep 5 min lv 4 (seat 11)  Heel raise x20 Toe rase x20 Stairs 7 inch single rail reciprocal 5 RT Lunge on step 2 x 10 each Band sidestepping RTB 5 RT in bars  SLS 3 x 10" SLS with vectors 5 x 5"  Retro walk 4 plates  Q59    5/63/87 Reassess Nustep 8 min lv 3 (seat 11) FOTO 2MWT 450 feet no AD  5 x STS 10.16 sec no UE Stair training 4 inch 2 RT reciprocal no HHA; 7 inch step no rail, reciprocal 3 RT  Tandem stance 2 x 30" Tandem stance on foam x30" each  Standing hip abduction x20 each  SLS 30 sec on RT; 8 sec on LT      PATIENT EDUCATION:  Education details: Patient educated on exam findings, POC, scope of PT, HEP. Person educated: Patient Education method: Explanation, Demonstration, and Handouts Education comprehension: verbalized understanding, returned demonstration, verbal cues required, and tactile cues required   HOME EXERCISE PROGRAM: 12/15/2021 Access Code: [email or web address] URL: [email or web address] Date: 12/15/2021 Prepared by: [facility]  Exercises - Supine Quadricep Sets  - 3-5 x daily - 7 x weekly - 10 reps - 10 second hold - Gluteal Sets  - 3-5 x daily - 7 x weekly - 10 reps - 10 second hold - Supine Ankle Pumps  - 3-5 x daily - 7 x weekly - 20 reps - Supine Heel Slide  - 3-5 x  daily - 7 x weekly - 2 sets - 10 reps - Mini Squat with Counter Support  - 2 x daily - 7 x weekly - 1 sets - 10 reps - Standing Tandem Balance with Counter Support  - 2 x daily - 7 x weekly - 1 sets - 1 reps - 30 sec hold  12/29/21:  ETI paperwork for compression garments, 3D hip excursion   ASSESSMENT:  CLINICAL IMPRESSION: Pt progressing well towards POC.  Continued with mobility exercises to address c/o stiffness.  Added leg press for strengthening and began golf swings for RTS with good weight shifting and rotation.  Pt encouraged to begin putting at home.  No reports of pain through session.    OBJECTIVE IMPAIRMENTS Abnormal gait, decreased activity tolerance, decreased balance, decreased endurance, decreased mobility, difficulty walking, decreased ROM, decreased strength, increased muscle spasms, impaired flexibility, improper body mechanics, and pain.   ACTIVITY LIMITATIONS lifting, bending, standing, squatting, sleeping, stairs, transfers, locomotion level, and caring for others  PARTICIPATION LIMITATIONS: meal prep, cleaning, laundry, shopping, community activity, and yard work  PERSONAL FACTORS Time since onset of injury/illness/exacerbation are also affecting patient's functional outcome.   REHAB POTENTIAL: Good  CLINICAL DECISION MAKING: Stable/uncomplicated  EVALUATION COMPLEXITY: Low   GOALS: Goals reviewed with patient? Yes  SHORT TERM GOALS: Target date: 12/30/2021  Patient will be independent with HEP in order to improve functional outcomes. Baseline:  Goal status: MET  2.  Patient will report at least 25% improvement in symptoms for improved quality of life. Baseline:  Goal status: MET    LONG TERM GOALS: Target date: 01/20/2022  Patient will report at least 75% improvement in symptoms for improved quality of life. Baseline: 50-60%  Goal status: ongoing  2.  Patient will improve FOTO score by at least 35 points in order to indicate improved tolerance to  activity. Baseline: 19% increase Goal status: ongoing  3.  Patient will be able to navigate stairs with reciprocal pattern without compensation in order to demonstrate improved LE strength. Baseline: can do 7 inch step reciprocal, no AD, no  rail  Goal status: MET  4.  Patient will be able to ambulate at least 400 feet in 2MWT without AD in order to demonstrate improved tolerance to activity. Baseline: 450 feet no AD Goal status: MET  5.  Patient will be able to complete 5x STS in under 11.4 seconds without UE support in order to reduce the risk of falls. Baseline: 10.16 seconds with no UE support  Goal status: MET  6.  Patient will be able to return to all activities unrestricted for improved ability to golf, walk, and participate with family.  Baseline: restricted Goal status: ongoing    PLAN: PT FREQUENCY: 2x/week  PT DURATION: 6 weeks  PLANNED INTERVENTIONS: Therapeutic exercises, Therapeutic activity, Neuromuscular re-education, Balance training, Gait training, Patient/Family education, Joint manipulation, Joint mobilization, Stair training, Orthotic/Fit training, DME instructions, Aquatic Therapy, Dry Needling, Electrical stimulation, Spinal manipulation, Spinal mobilization, Cryotherapy, Moist heat, Compression bandaging, scar mobilization, Splintting, Taping, Traction, Ultrasound, Ionotophoresis 22m/ml Dexamethasone, and Manual therapy  PLAN FOR NEXT SESSION: Continue work on hip strengthening, gait and balance as tolerated. Add resistance with golf swing to improve confidence with RTS next session.    CIhor Austin LPTA/CLT; CDelana [phone] PM, 01/04/22

## 2022-01-06 ENCOUNTER — Encounter (HOSPITAL_COMMUNITY): Payer: Medicare Other

## 2022-01-11 ENCOUNTER — Encounter (HOSPITAL_COMMUNITY): Payer: Self-pay

## 2022-01-11 ENCOUNTER — Ambulatory Visit (HOSPITAL_COMMUNITY): Payer: Medicare Other | Attending: Orthopedic Surgery

## 2022-01-11 DIAGNOSIS — R262 Difficulty in walking, not elsewhere classified: Secondary | ICD-10-CM | POA: Insufficient documentation

## 2022-01-11 DIAGNOSIS — R2689 Other abnormalities of gait and mobility: Secondary | ICD-10-CM | POA: Diagnosis not present

## 2022-01-11 DIAGNOSIS — M25551 Pain in right hip: Secondary | ICD-10-CM | POA: Insufficient documentation

## 2022-01-11 DIAGNOSIS — M6281 Muscle weakness (generalized): Secondary | ICD-10-CM | POA: Insufficient documentation

## 2022-01-11 NOTE — Therapy (Signed)
OUTPATIENT PHYSICAL THERAPY LOWER EXTREMITY TREATMENT  Patient Name: JAYVEON CONVEY MRN: [id number] DOB:13-Nov-1950, 71 y.o., male Today's Date: 01/11/2022    PT End of Session - 01/11/22 1414     Visit Number 9    Number of Visits 12    Date for PT Re-Evaluation 01/20/22    Authorization Type Primary Medicare Secondary AETNA    Progress Note Due on Visit 10    PT Start Time 1348    PT Stop Time 1275    PT Time Calculation (min) 40 min    Activity Tolerance Patient tolerated treatment well    Behavior During Therapy [facility] for tasks assessed/performed                 Past Medical History:  Diagnosis Date   Adjacent segment disease with spinal stenosis    Arthritis    "knees; back" (07/14/2014)   Asthma    CAD (coronary artery disease)    Exertional dyspnea    GERD (gastroesophageal reflux disease)    Hyperlipidemia    Hypertension    Primary localized osteoarthritis of left knee    Primary localized osteoarthritis of right knee 05/04/2020   Seasonal allergies    Past Surgical History:  Procedure Laterality Date   ANTERIOR CERVICAL DECOMP/DISCECTOMY FUSION  1999   Dr Evlyn Kanner SURGERY     BUNIONECTOMY Right 2013   COLONOSCOPY N/A 11/06/2012   Procedure: COLONOSCOPY;  Surgeon: Jamesetta So, MD;  [facility];  Service: Gastroenterology;  Laterality: N/A;   FOOT SURGERY Right 2013   bunionectomy   KNEE ARTHROSCOPY Bilateral 1990's   POSTERIOR LUMBAR FUSION  1985; 2009   Dr Joya Salm   TONSILLECTOMY  1950's   TOTAL HIP ARTHROPLASTY Right 12/07/2021   Procedure: TOTAL HIP ARTHROPLASTY ANTERIOR APPROACH;  Surgeon: Renette Butters, MD;  Location[facility];  Service: Orthopedics;  Laterality: Right;   TOTAL KNEE ARTHROPLASTY Left 07/14/2014   Procedure: LEFT TOTAL KNEE ARTHROPLASTY;  Surgeon: [name], MD;  Location: [place];  Service: Orthopedics;  Laterality: Left;   TOTAL KNEE ARTHROPLASTY Right 05/11/2020   Procedure: TOTAL KNEE ARTHROPLASTY;   Surgeon: Elsie Saas, MD;  Location: [facility];  Service: Orthopedics;  Laterality: Right;   WISDOM TOOTH EXTRACTION     Patient Active Problem List   Diagnosis Date Noted   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   CAD (coronary artery disease) 08/16/2021   DOE (dyspnea on exertion) 08/16/2021   Tremor 04/27/2021   Gait abnormality 04/27/2021   Primary localized osteoarthritis of right knee 05/04/2020   Lumbar adjacent segment disease with spondylolisthesis 02/06/2020   Chest pressure 09/27/2019   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   Morbid obesity ([place]) 05/08/2015   DJD (degenerative joint disease) of knee 07/14/2014   Acute upper respiratory infection 06/12/2014   Cigarette smoker 06/12/2014   Cough 06/12/2014   Primary localized osteoarthritis of left knee    Hyperlipidemia    GERD (gastroesophageal reflux disease)    Back pain    Dyspnea 05/07/2014   Multiple pulmonary nodules 05/07/2014    PCP: Sharilyn Sites MD  REFERRING PROVIDER: Renette Butters, MD  Next apt 01/18/22  REFERRING DIAG: S/P R ant total hip replacement   THERAPY DIAG:  Pain in right hip  Difficulty in walking, not elsewhere classified  Muscle weakness (generalized)  Other abnormalities of gait and mobility  Rationale for Evaluation and Treatment Rehabilitation  ONSET DATE:  12/07/2021   SUBJECTIVE:   SUBJECTIVE STATEMENT: Reports main difficulty with initial standing, stiffness.  Has been working on remodeling an older home, has taken a lot of time.  Currently working on replacing flooring.    PERTINENT HISTORY: Hx R TKA - 2021  PAIN:  Are you having pain? Yes: NPRS scale: 1-2/10 Pain location: R hip  Pain description: sore Aggravating factors: weightbearing Relieving factors: sitting, rest  PRECAUTIONS: None  WEIGHT BEARING RESTRICTIONS No  FALLS:  Has patient fallen in last 6 months? Yes. Number of falls 2-3  LIVING ENVIRONMENT: Lives  with: lives with their family Lives in: House/apartment Stairs: Yes: Internal: 15 steps; on right going up and External: 2 steps; none Has following equipment at home: Single point cane, Walker - 2 wheeled, and bed side commode  OCCUPATION: Retired  PLOF: Independent  PATIENT GOALS be able to walk without AD, work in yard, fish   OBJECTIVE:  PATIENT SURVEYS:  FOTO 54% function  (was 35%)  COGNITION:  Overall cognitive status: Within functional limits for tasks assessed     SENSATION: WFL  POSTURE: No Significant postural limitations, rounded shoulders, and forward head  OBSERVATION: slight redness in proximal femoral region, bandage intact  PALPATION: Grossly tender throughout R hip  LOWER EXTREMITY ROM:  R hip decreased due to pain  Active ROM Right 12/22/21 Left 12/22/21  Hip flexion 90 95  Hip extension 0 0  Hip abduction    Hip adduction    Hip internal rotation    Hip external rotation    Knee flexion    Knee extension    Ankle dorsiflexion    Ankle plantarflexion    Ankle inversion    Ankle eversion     (Blank rows = not tested)  LOWER EXTREMITY MMT:  MMT Right eval Left eval  Hip flexion 4-/5* 5/5  Hip extension    Hip abduction 3-/5 *   Hip adduction    Hip internal rotation    Hip external rotation    Knee flexion 5/5 5/5  Knee extension 5/5 5/5  Ankle dorsiflexion 5/5 5/5  Ankle plantarflexion    Ankle inversion    Ankle eversion     (Blank rows = not tested) * =pain   FUNCTIONAL TESTS:  5 times sit to stand: 14.02 seconds with UE support 2 minute walk test: 115 feet Bed mobility: assist for LE with sit to supine, assist for RLE and trunk with supine to sit due to pain/weakness in RLE  GAIT: Distance walked: 115 feet Assistive device utilized: Walker - 2 wheeled Level of assistance: Modified independence Comments: 2MWT, slow, labored, trunk flexed     TODAY'S TREATMENT: 01/11/22: GTB golf swing 20x 3D hip excursion 15x (heel  lift at end range rotation for RTS) Good mechanics with squats no HHA Forward lunge 2sets x 10 reps no HHA 5STS 8.52" no HHA Vector stance 3x 5" intermitten HHA Retro walk 5Pl 5RT Sidestep 3PL 5RT  01/04/22: 2MWT no AD, good mechanics 3D hip excursion 15x (squat front of chair, no UE support) Golf swing with long PCP pipe to improve rotation  Vector stance with 1 finger or no A 5x5"  Leg press 3sets x 10 reps 4Pl  Retro walk 4Pl Bodycraft 5RT  Sidestep 3Pl 5RT Bodycraft  Tandem stance on foam 2x 30"  12/29/21 2MWT 438f no AD Heel raise 20x incline slope Toe raises 20x decline slope Squat 2x 10 no HHA required  Sidestep 3Pl 5RT Bodycraft Retro walk  4Pl Bodycraft 3D hip excursion 10x  Vector stance 3x 5" Thigh stretch with forward lunges 12in step 5x 20 Measurement and ETI handout for knee high compression socks.  12/27/21 Nustep 5 min lv 4 (seat 11) Heel raise x20 Toe rase x20 Stairs 7 inch single rail reciprocal 5 RT Lunge on step 2 x 10 each Band sidestepping RTB 5 RT in bars  SLS 3 x 10" SLS with vectors 5 x 5"  Retro walk 4 plates  I29    7/98/92 Reassess Nustep 8 min lv 3 (seat 11) FOTO 2MWT 450 feet no AD  5 x STS 10.16 sec no UE Stair training 4 inch 2 RT reciprocal no HHA; 7 inch step no rail, reciprocal 3 RT  Tandem stance 2 x 30" Tandem stance on foam x30" each  Standing hip abduction x20 each  SLS 30 sec on RT; 8 sec on LT      PATIENT EDUCATION:  Education details: Patient educated on exam findings, POC, scope of PT, HEP. Person educated: Patient Education method: Explanation, Demonstration, and Handouts Education comprehension: verbalized understanding, returned demonstration, verbal cues required, and tactile cues required   HOME EXERCISE PROGRAM: 12/15/2021 Access Code: [email or web address] URL: [email or web address] Date: 12/15/2021 Prepared by: [facility]  Exercises - Supine Quadricep Sets  - 3-5 x daily - 7 x weekly - 10 reps - 10  second hold - Gluteal Sets  - 3-5 x daily - 7 x weekly - 10 reps - 10 second hold - Supine Ankle Pumps  - 3-5 x daily - 7 x weekly - 20 reps - Supine Heel Slide  - 3-5 x daily - 7 x weekly - 2 sets - 10 reps - Mini Squat with Counter Support  - 2 x daily - 7 x weekly - 1 sets - 10 reps - Standing Tandem Balance with Counter Support  - 2 x daily - 7 x weekly - 1 sets - 1 reps - 30 sec hold  12/29/21:  ETI paperwork for compression garments, 3D hip excursion   ASSESSMENT:  CLINICAL IMPRESSION: Pt progressing well toward POC.  Added lunges for functional strengthening and resistance added with golf swings for RTS activities.  No reports of pain through session.  OBJECTIVE IMPAIRMENTS Abnormal gait, decreased activity tolerance, decreased balance, decreased endurance, decreased mobility, difficulty walking, decreased ROM, decreased strength, increased muscle spasms, impaired flexibility, improper body mechanics, and pain.   ACTIVITY LIMITATIONS lifting, bending, standing, squatting, sleeping, stairs, transfers, locomotion level, and caring for others  PARTICIPATION LIMITATIONS: meal prep, cleaning, laundry, shopping, community activity, and yard work  PERSONAL FACTORS Time since onset of injury/illness/exacerbation are also affecting patient's functional outcome.   REHAB POTENTIAL: Good  CLINICAL DECISION MAKING: Stable/uncomplicated  EVALUATION COMPLEXITY: Low   GOALS: Goals reviewed with patient? Yes  SHORT TERM GOALS: Target date: 12/30/2021  Patient will be independent with HEP in order to improve functional outcomes. Baseline:  Goal status: MET  2.  Patient will report at least 25% improvement in symptoms for improved quality of life. Baseline:  Goal status: MET    LONG TERM GOALS: Target date: 01/20/2022  Patient will report at least 75% improvement in symptoms for improved quality of life. Baseline: 50-60%  Goal status: ongoing  2.  Patient will improve FOTO score by  at least 35 points in order to indicate improved tolerance to activity. Baseline: 19% increase Goal status: ongoing  3.  Patient will be able to navigate stairs with reciprocal pattern without compensation in  order to demonstrate improved LE strength. Baseline: can do 7 inch step reciprocal, no AD, no rail  Goal status: MET  4.  Patient will be able to ambulate at least 400 feet in 2MWT without AD in order to demonstrate improved tolerance to activity. Baseline: 450 feet no AD Goal status: MET  5.  Patient will be able to complete 5x STS in under 11.4 seconds without UE support in order to reduce the risk of falls. Baseline: 10.16 seconds with no UE support  Goal status: MET  6.  Patient will be able to return to all activities unrestricted for improved ability to golf, walk, and participate with family.  Baseline: restricted Goal status: ongoing    PLAN: PT FREQUENCY: 2x/week  PT DURATION: 6 weeks  PLANNED INTERVENTIONS: Therapeutic exercises, Therapeutic activity, Neuromuscular re-education, Balance training, Gait training, Patient/Family education, Joint manipulation, Joint mobilization, Stair training, Orthotic/Fit training, DME instructions, Aquatic Therapy, Dry Needling, Electrical stimulation, Spinal manipulation, Spinal mobilization, Cryotherapy, Moist heat, Compression bandaging, scar mobilization, Splintting, Taping, Traction, Ultrasound, Ionotophoresis 31m/ml Dexamethasone, and Manual therapy  PLAN FOR NEXT SESSION:  10th progress note next session.  Continue work on hip strengthening, gait and balance as tolerated. Add resistance with golf swing to improve confidence with RTS next session.    CIhor Austin LPTA/CLT; CDelana [phone] PM, 01/11/22

## 2022-01-13 ENCOUNTER — Ambulatory Visit (HOSPITAL_COMMUNITY): Payer: Medicare Other | Admitting: Physical Therapy

## 2022-01-13 DIAGNOSIS — R2689 Other abnormalities of gait and mobility: Secondary | ICD-10-CM | POA: Diagnosis not present

## 2022-01-13 DIAGNOSIS — R262 Difficulty in walking, not elsewhere classified: Secondary | ICD-10-CM

## 2022-01-13 DIAGNOSIS — M6281 Muscle weakness (generalized): Secondary | ICD-10-CM

## 2022-01-13 DIAGNOSIS — M25551 Pain in right hip: Secondary | ICD-10-CM | POA: Diagnosis not present

## 2022-01-13 NOTE — Therapy (Signed)
OUTPATIENT PHYSICAL THERAPY LOWER EXTREMITY TREATMENT  Patient Name: Patrick Mathews MRN: [id number] DOB:December 28, 1950, 71 y.o., male Today's Date: 01/13/2022  PHYSICAL THERAPY DISCHARGE SUMMARY  Visits from Start of Care: 10  Current functional level related to goals / functional outcomes: See below   Remaining deficits: See below   Education / Equipment: See assessment   Patient agrees to discharge. Patient goals were met. Patient is being discharged due to meeting the stated rehab goals.    PT End of Session - 01/13/22 1354     Visit Number 10    Number of Visits 12    Date for PT Re-Evaluation 01/20/22    Authorization Type Primary Medicare Secondary AETNA    Progress Note Due on Visit 10    PT Start Time 1350    PT Stop Time 1423    PT Time Calculation (min) 33 min    Activity Tolerance Patient tolerated treatment well    Behavior During Therapy WFL for tasks assessed/performed                 Past Medical History:  Diagnosis Date   Adjacent segment disease with spinal stenosis    Arthritis    "knees; back" (07/14/2014)   Asthma    CAD (coronary artery disease)    Exertional dyspnea    GERD (gastroesophageal reflux disease)    Hyperlipidemia    Hypertension    Primary localized osteoarthritis of left knee    Primary localized osteoarthritis of right knee 05/04/2020   Seasonal allergies    Past Surgical History:  Procedure Laterality Date   ANTERIOR CERVICAL DECOMP/DISCECTOMY FUSION  1999   Dr Evlyn Kanner SURGERY     BUNIONECTOMY Right 2013   COLONOSCOPY N/A 11/06/2012   Procedure: COLONOSCOPY;  Surgeon: Jamesetta So, MD;  Location: [facility];  Service: Gastroenterology;  Laterality: N/A;   FOOT SURGERY Right 2013   bunionectomy   KNEE ARTHROSCOPY Bilateral 1990's   POSTERIOR LUMBAR FUSION  1985; 2009   Dr Joya Salm   TONSILLECTOMY  1950's   TOTAL HIP ARTHROPLASTY Right 12/07/2021   Procedure: TOTAL HIP ARTHROPLASTY ANTERIOR APPROACH;   Surgeon: Renette Butters, MD;  Location: [facility];  Service: Orthopedics;  Laterality: Right;   TOTAL KNEE ARTHROPLASTY Left 07/14/2014   Procedure: LEFT TOTAL KNEE ARTHROPLASTY;  Surgeon: [name], MD;  Location: [facility];  Service: Orthopedics;  Laterality: Left;   TOTAL KNEE ARTHROPLASTY Right 05/11/2020   Procedure: TOTAL KNEE ARTHROPLASTY;  Surgeon: Elsie Saas, MD;  Location: [facility];  Service: Orthopedics;  Laterality: Right;   WISDOM TOOTH EXTRACTION     Patient Active Problem List   Diagnosis Date Noted   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   CAD (coronary artery disease) 08/16/2021   DOE (dyspnea on exertion) 08/16/2021   Tremor 04/27/2021   Gait abnormality 04/27/2021   Primary localized osteoarthritis of right knee 05/04/2020   Lumbar adjacent segment disease with spondylolisthesis 02/06/2020   Chest pressure 09/27/2019   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   Morbid obesity ([place]) 05/08/2015   DJD (degenerative joint disease) of knee 07/14/2014   Acute upper respiratory infection 06/12/2014   Cigarette smoker 06/12/2014   Cough 06/12/2014   Primary localized osteoarthritis of left knee    Hyperlipidemia    GERD (gastroesophageal reflux disease)    Back pain    Dyspnea 05/07/2014   Multiple pulmonary nodules 05/07/2014    PCP: Sharilyn Sites  MD  REFERRING PROVIDER: Renette Butters, MD  Next apt 01/18/22  REFERRING DIAG: S/P R ant total hip replacement   THERAPY DIAG:  Pain in right hip  Difficulty in walking, not elsewhere classified  Muscle weakness (generalized)  Rationale for Evaluation and Treatment Rehabilitation  ONSET DATE: 12/07/2021   SUBJECTIVE:   SUBJECTIVE STATEMENT: Doing much better. Reports 90% improved overall. Feels much stronger and walking better. Feels most limited by LT leg and his stamina level at this point.   PERTINENT HISTORY: Hx R TKA - 2021  PAIN:  Are you having pain?  No   PRECAUTIONS: None  WEIGHT BEARING RESTRICTIONS No  FALLS:  Has patient fallen in last 6 months? Yes. Number of falls 2-3  LIVING ENVIRONMENT: Lives with: lives with their family Lives in: House/apartment Stairs: Yes: Internal: 15 steps; on right going up and External: 2 steps; none Has following equipment at home: Single point cane, Walker - 2 wheeled, and bed side commode  OCCUPATION: Retired  PLOF: Independent  PATIENT GOALS be able to walk without AD, work in yard, fish   OBJECTIVE:  PATIENT SURVEYS:  FOTO 73% function  (was 35%)  COGNITION:  Overall cognitive status: Within functional limits for tasks assessed     SENSATION: WFL  POSTURE: No Significant postural limitations, rounded shoulders, and forward head  OBSERVATION: slight redness in proximal femoral region, bandage intact  PALPATION: Grossly tender throughout R hip  LOWER EXTREMITY ROM:  R hip decreased due to pain  Active ROM Right 12/22/21 Left 12/22/21  Hip flexion 90 95  Hip extension 0 0  Hip abduction    Hip adduction    Hip internal rotation    Hip external rotation    Knee flexion    Knee extension    Ankle dorsiflexion    Ankle plantarflexion    Ankle inversion    Ankle eversion     (Blank rows = not tested)  LOWER EXTREMITY MMT:  MMT Right eval Left eval  Hip flexion 4-/5* 5/5  Hip extension    Hip abduction 3-/5 *   Hip adduction    Hip internal rotation    Hip external rotation    Knee flexion 5/5 5/5  Knee extension 5/5 5/5  Ankle dorsiflexion 5/5 5/5  Ankle plantarflexion    Ankle inversion    Ankle eversion     (Blank rows = not tested) * =pain   FUNCTIONAL TESTS:  5 times sit to stand: 14.02 seconds with UE support 2 minute walk test: 115 feet Bed mobility: assist for LE with sit to supine, assist for RLE and trunk with supine to sit due to pain/weakness in RLE  GAIT: Distance walked: 115 feet Assistive device utilized: Walker - 2 wheeled Level of  assistance: Modified independence Comments: 2MWT, slow, labored, trunk flexed     TODAY'S TREATMENT: 01/13/22 Nu step lv 4 8 min  FOTO 2MWT 5x STS Balance SLS (RLE 30 sec; LLE 15 sec)   01/11/22: GTB golf swing 20x 3D hip excursion 15x (heel lift at end range rotation for RTS) Good mechanics with squats no HHA Forward lunge 2sets x 10 reps no HHA 5STS 8.52" no HHA Vector stance 3x 5" intermitten HHA Retro walk 5Pl 5RT Sidestep 3PL 5RT    PATIENT EDUCATION:  Education details: Patient educated on exam findings, POC, scope of PT, HEP. Person educated: Patient Education method: Explanation, Demonstration, and Handouts Education comprehension: verbalized understanding, returned demonstration, verbal cues required, and tactile cues  required   HOME EXERCISE PROGRAM: 12/15/2021 Access Code: [email or web address] URL: [email or web address] Date: 12/15/2021 Prepared by: [facility]  Exercises - Supine Quadricep Sets  - 3-5 x daily - 7 x weekly - 10 reps - 10 second hold - Gluteal Sets  - 3-5 x daily - 7 x weekly - 10 reps - 10 second hold - Supine Ankle Pumps  - 3-5 x daily - 7 x weekly - 20 reps - Supine Heel Slide  - 3-5 x daily - 7 x weekly - 2 sets - 10 reps - Mini Squat with Counter Support  - 2 x daily - 7 x weekly - 1 sets - 10 reps - Standing Tandem Balance with Counter Support  - 2 x daily - 7 x weekly - 1 sets - 1 reps - 30 sec hold  12/29/21:  ETI paperwork for compression garments, 3D hip excursion   ASSESSMENT:  CLINICAL IMPRESSION: Patient has made very good progress and has currently met all therapy goals. Showing good functional strength, balance and improvement in functional mobility. Patient being DC today with all goals MET. Reviewed HEP and answered all patient questions. Patient encouraged to follow up with therapy services with any further questions or concerns.   OBJECTIVE IMPAIRMENTS Abnormal gait, decreased activity tolerance, decreased balance, decreased  endurance, decreased mobility, difficulty walking, decreased ROM, decreased strength, increased muscle spasms, impaired flexibility, improper body mechanics, and pain.   ACTIVITY LIMITATIONS lifting, bending, standing, squatting, sleeping, stairs, transfers, locomotion level, and caring for others  PARTICIPATION LIMITATIONS: meal prep, cleaning, laundry, shopping, community activity, and yard work  PERSONAL FACTORS Time since onset of injury/illness/exacerbation are also affecting patient's functional outcome.   REHAB POTENTIAL: Good  CLINICAL DECISION MAKING: Stable/uncomplicated  EVALUATION COMPLEXITY: Low   GOALS: Goals reviewed with patient? Yes  SHORT TERM GOALS: Target date: 12/30/2021  Patient will be independent with HEP in order to improve functional outcomes. Baseline:  Goal status: MET  2.  Patient will report at least 25% improvement in symptoms for improved quality of life. Baseline:  Goal status: MET    LONG TERM GOALS: Target date: 01/20/2022  Patient will report at least 75% improvement in symptoms for improved quality of life. Baseline: 90% improved  Goal status: MET  2.  Patient will improve FOTO score by at least 35 points in order to indicate improved tolerance to activity. Baseline: 73% function  Goal status: MET  3.  Patient will be able to navigate stairs with reciprocal pattern without compensation in order to demonstrate improved LE strength. Baseline: can do 7 inch step reciprocal, no AD, no rail  Goal status: MET  4.  Patient will be able to ambulate at least 400 feet in 2MWT without AD in order to demonstrate improved tolerance to activity. Baseline: 520 feet no AD Goal status: MET  5.  Patient will be able to complete 5x STS in under 11.4 seconds without UE support in order to reduce the risk of falls. Baseline: 8.23 seconds with no UE support  Goal status: MET  6.  Patient will be able to return to all activities unrestricted for  improved ability to golf, walk, and participate with family.  Baseline: Can do all ADLs, has not gone golfing yet but has been practicing "chipping" movement with no complaints. Goal status: MET   PLAN: PT FREQUENCY: 2x/week  PT DURATION: 6 weeks  PLANNED INTERVENTIONS: Therapeutic exercises, Therapeutic activity, Neuromuscular re-education, Balance training, Gait training, Patient/Family education, Joint manipulation, Joint mobilization, Stair  training, Orthotic/Fit training, DME instructions, Aquatic Therapy, Dry Needling, Electrical stimulation, Spinal manipulation, Spinal mobilization, Cryotherapy, Moist heat, Compression bandaging, scar mobilization, Splintting, Taping, Traction, Ultrasound, Ionotophoresis 22m/ml Dexamethasone, and Manual therapy  PLAN FOR NEXT SESSION: DC to HEP    2:26 PM, 01/13/22 CJosue HectorPT DPT  Physical Therapist [facility] [phone]

## 2022-01-17 ENCOUNTER — Encounter (HOSPITAL_COMMUNITY): Payer: Medicare Other | Admitting: Physical Therapy

## 2022-01-19 ENCOUNTER — Encounter (HOSPITAL_COMMUNITY): Payer: Medicare Other | Admitting: Physical Therapy

## 2022-01-19 DIAGNOSIS — M1611 Unilateral primary osteoarthritis, right hip: Secondary | ICD-10-CM | POA: Diagnosis not present

## 2022-03-07 ENCOUNTER — Ambulatory Visit (HOSPITAL_COMMUNITY)
Admission: RE | Admit: 2022-03-07 | Discharge: 2022-03-07 | Disposition: A | Discharge status: Home / Self Care | Payer: Medicare Other | Source: Ambulatory Visit | Attending: Family Medicine | Admitting: Family Medicine

## 2022-03-07 ENCOUNTER — Other Ambulatory Visit (HOSPITAL_COMMUNITY): Payer: Self-pay | Admitting: Family Medicine

## 2022-03-07 DIAGNOSIS — U099 Post covid-19 condition, unspecified: Secondary | ICD-10-CM | POA: Diagnosis not present

## 2022-03-07 DIAGNOSIS — E6609 Other obesity due to excess calories: Secondary | ICD-10-CM | POA: Diagnosis not present

## 2022-03-07 DIAGNOSIS — Z6829 Body mass index (BMI) 29.0-29.9, adult: Secondary | ICD-10-CM | POA: Diagnosis not present

## 2022-03-07 DIAGNOSIS — R059 Cough, unspecified: Secondary | ICD-10-CM | POA: Diagnosis not present

## 2022-03-08 DIAGNOSIS — M4316 Spondylolisthesis, lumbar region: Secondary | ICD-10-CM | POA: Diagnosis not present

## 2022-03-09 ENCOUNTER — Other Ambulatory Visit: Payer: Self-pay | Admitting: Gastroenterology

## 2022-03-09 DIAGNOSIS — R109 Unspecified abdominal pain: Secondary | ICD-10-CM

## 2022-03-09 DIAGNOSIS — K5900 Constipation, unspecified: Secondary | ICD-10-CM | POA: Diagnosis not present

## 2022-03-09 DIAGNOSIS — R634 Abnormal weight loss: Secondary | ICD-10-CM

## 2022-03-09 DIAGNOSIS — K219 Gastro-esophageal reflux disease without esophagitis: Secondary | ICD-10-CM | POA: Diagnosis not present

## 2022-04-01 ENCOUNTER — Ambulatory Visit
Admission: RE | Admit: 2022-04-01 | Discharge: 2022-04-01 | Disposition: A | Discharge status: Home / Self Care | Payer: Medicare Other | Source: Ambulatory Visit | Attending: Gastroenterology | Admitting: Gastroenterology

## 2022-04-01 DIAGNOSIS — R634 Abnormal weight loss: Secondary | ICD-10-CM

## 2022-04-01 DIAGNOSIS — K5730 Diverticulosis of large intestine without perforation or abscess without bleeding: Secondary | ICD-10-CM | POA: Diagnosis not present

## 2022-04-01 DIAGNOSIS — R109 Unspecified abdominal pain: Secondary | ICD-10-CM

## 2022-04-01 MED ORDER — IOPAMIDOL (ISOVUE-300) INJECTION 61%
100.0000 mL | Freq: Once | INTRAVENOUS | Status: AC | PRN
  Administered 2022-04-01: 100 mL via INTRAVENOUS

## 2022-05-13 DIAGNOSIS — M25511 Pain in right shoulder: Secondary | ICD-10-CM | POA: Diagnosis not present

## 2022-05-13 DIAGNOSIS — M542 Cervicalgia: Secondary | ICD-10-CM | POA: Diagnosis not present

## 2022-06-09 DIAGNOSIS — E669 Obesity, unspecified: Secondary | ICD-10-CM | POA: Diagnosis not present

## 2022-06-09 DIAGNOSIS — H6502 Acute serous otitis media, left ear: Secondary | ICD-10-CM | POA: Diagnosis not present

## 2022-06-09 DIAGNOSIS — Z6830 Body mass index (BMI) 30.0-30.9, adult: Secondary | ICD-10-CM | POA: Diagnosis not present

## 2022-06-15 DIAGNOSIS — R0982 Postnasal drip: Secondary | ICD-10-CM | POA: Diagnosis not present

## 2022-06-15 DIAGNOSIS — J310 Chronic rhinitis: Secondary | ICD-10-CM | POA: Diagnosis not present

## 2022-06-15 DIAGNOSIS — J343 Hypertrophy of nasal turbinates: Secondary | ICD-10-CM | POA: Diagnosis not present

## 2022-06-15 DIAGNOSIS — H9012 Conductive hearing loss, unilateral, left ear, with unrestricted hearing on the contralateral side: Secondary | ICD-10-CM | POA: Diagnosis not present

## 2022-06-15 DIAGNOSIS — H6522 Chronic serous otitis media, left ear: Secondary | ICD-10-CM | POA: Diagnosis not present

## 2022-06-15 DIAGNOSIS — H6982 Other specified disorders of Eustachian tube, left ear: Secondary | ICD-10-CM | POA: Diagnosis not present

## 2022-08-08 DIAGNOSIS — L720 Epidermal cyst: Secondary | ICD-10-CM | POA: Diagnosis not present

## 2022-08-08 DIAGNOSIS — D225 Melanocytic nevi of trunk: Secondary | ICD-10-CM | POA: Diagnosis not present

## 2022-08-08 DIAGNOSIS — L218 Other seborrheic dermatitis: Secondary | ICD-10-CM | POA: Diagnosis not present

## 2022-08-08 DIAGNOSIS — L570 Actinic keratosis: Secondary | ICD-10-CM | POA: Diagnosis not present

## 2022-08-08 DIAGNOSIS — X32XXXD Exposure to sunlight, subsequent encounter: Secondary | ICD-10-CM | POA: Diagnosis not present

## 2022-08-09 ENCOUNTER — Ambulatory Visit (INDEPENDENT_AMBULATORY_CARE_PROVIDER_SITE_OTHER): Payer: Medicare Other | Admitting: Podiatry

## 2022-08-09 ENCOUNTER — Ambulatory Visit (INDEPENDENT_AMBULATORY_CARE_PROVIDER_SITE_OTHER): Payer: Medicare Other

## 2022-08-09 ENCOUNTER — Encounter: Payer: Self-pay | Admitting: Podiatry

## 2022-08-09 DIAGNOSIS — M21622 Bunionette of left foot: Secondary | ICD-10-CM

## 2022-08-09 DIAGNOSIS — Q66221 Congenital metatarsus adductus, right foot: Secondary | ICD-10-CM | POA: Diagnosis not present

## 2022-08-09 DIAGNOSIS — T8484XA Pain due to internal orthopedic prosthetic devices, implants and grafts, initial encounter: Secondary | ICD-10-CM

## 2022-08-09 DIAGNOSIS — M2011 Hallux valgus (acquired), right foot: Secondary | ICD-10-CM

## 2022-08-09 DIAGNOSIS — Z981 Arthrodesis status: Secondary | ICD-10-CM | POA: Insufficient documentation

## 2022-08-09 DIAGNOSIS — M216X1 Other acquired deformities of right foot: Secondary | ICD-10-CM

## 2022-08-09 DIAGNOSIS — M21611 Bunion of right foot: Secondary | ICD-10-CM

## 2022-08-09 DIAGNOSIS — L84 Corns and callosities: Secondary | ICD-10-CM | POA: Diagnosis not present

## 2022-08-09 NOTE — Patient Instructions (Addendum)
Look for urea 40% cream or ointment and apply to the thickened dry skin / calluses. This can be bought over the counter, at a pharmacy or online such as [employer].  Aperture pads (the hole pad) or dancer's pads made from felt or foam pads can be bought on [employer]

## 2022-08-10 NOTE — Progress Notes (Signed)
  Subjective:  Patient ID: Patrick Mathews, male    DOB: 1951-02-15,  MRN: [id number]  Chief Complaint  Patient presents with   Callouses    NP  Right 4th submet, Left 5th submet - very painful right>left    72 y.o. male presents with the above complaint. History confirmed with patient.  He has previously had surgery on the right foot number of years ago for bunionectomy.  Has a hardware that he can feel and sometimes causes pain.  He notes that the surgery was supposed to make the bunion go away but he is still had problems with it.  His primary areas of pain and complaint are submetatarsal 4 on the right foot and submetatarsal 5 on the left foot where he has a painful callus.  Objective:  Physical Exam: warm, good capillary refill, no trophic changes or ulcerative lesions, normal DP and PT pulses, normal sensory exam, and bilaterally right worse than left he has hallux valgus deformity, painful callus submetatarsal 4 on the right and 5 on the left, hammertoe deformities and contracture of the digits semirigid in nature.  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 Painful orthopaedic hardware ([place])   2. Hallux valgus with bunions, right   3. Metatarsus adductus of right foot   4. 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 Callus of foot   6. Tailor's bunionette, left      Plan:  Patient was evaluated and treated and all questions answered.  We reviewed his radiographs together.  We discussed the severity of his metatarsus adductus deformity how this contributes to his residual bunion deformity, hammertoes and current symptoms.  We discussed further treatment including operative and nonoperative treatment.   Nonsurgical we discussed padding the painful callus lesions, utilizing creams and lotions to offload this as well as a custom molded orthosis.  Offloading pads were dispensed today and he will trial these.  If they are helpful we will consider custom molded accommodative orthosis.  We also discussed surgical correction as an option.  I discussed with him that his deformity could be corrected with a Tretha Sciara procedure with pan metatarsal head resection as well as first MTPJ fusion.  He will consider this option.  We also discussed the option of only removing the hardware that is irritating and painful for him.  We discussed the risk benefits and recovery of each of these procedures.  He will consider his options and return to see me if he is ready to schedule.  No follow-ups on file.

## 2022-09-06 DIAGNOSIS — M4722 Other spondylosis with radiculopathy, cervical region: Secondary | ICD-10-CM | POA: Diagnosis not present

## 2022-09-06 DIAGNOSIS — M4316 Spondylolisthesis, lumbar region: Secondary | ICD-10-CM | POA: Diagnosis not present

## 2022-09-07 ENCOUNTER — Other Ambulatory Visit: Payer: Self-pay | Admitting: Neurosurgery

## 2022-09-07 ENCOUNTER — Other Ambulatory Visit: Payer: Self-pay | Admitting: Physical Medicine and Rehabilitation

## 2022-09-07 DIAGNOSIS — M4722 Other spondylosis with radiculopathy, cervical region: Secondary | ICD-10-CM

## 2022-09-07 DIAGNOSIS — M4316 Spondylolisthesis, lumbar region: Secondary | ICD-10-CM

## 2022-09-29 ENCOUNTER — Ambulatory Visit (INDEPENDENT_AMBULATORY_CARE_PROVIDER_SITE_OTHER): Payer: Medicare Other | Admitting: Podiatry

## 2022-09-29 DIAGNOSIS — M7741 Metatarsalgia, right foot: Secondary | ICD-10-CM

## 2022-09-29 DIAGNOSIS — M7742 Metatarsalgia, left foot: Secondary | ICD-10-CM | POA: Diagnosis not present

## 2022-09-29 DIAGNOSIS — M216X1 Other acquired deformities of right foot: Secondary | ICD-10-CM

## 2022-09-29 DIAGNOSIS — M2011 Hallux valgus (acquired), right foot: Secondary | ICD-10-CM | POA: Diagnosis not present

## 2022-09-29 DIAGNOSIS — L84 Corns and callosities: Secondary | ICD-10-CM | POA: Diagnosis not present

## 2022-09-29 DIAGNOSIS — M21611 Bunion of right foot: Secondary | ICD-10-CM | POA: Diagnosis not present

## 2022-09-29 DIAGNOSIS — Q66221 Congenital metatarsus adductus, right foot: Secondary | ICD-10-CM

## 2022-10-01 ENCOUNTER — Other Ambulatory Visit: Payer: Medicare Other

## 2022-10-02 NOTE — Progress Notes (Signed)
  Subjective:  Patient ID: Patrick Mathews, male    DOB: 11/29/1950,  MRN: [id number]  Chief Complaint  Patient presents with   Callouses    Painful callus lesion    72 y.o. male presents with the above complaint. History confirmed with patient.  He returns for follow-up, the painful areas are still giving him trouble and calluses buildup he is utilizing urea cream.  Objective:  Physical Exam: warm, good capillary refill, no trophic changes or ulcerative lesions, normal DP and PT pulses, normal sensory exam, and bilaterally right worse than left he has hallux valgus deformity, painful callus submetatarsal 4 on the right and 5 on the left, hammertoe deformities and contracture of the digits semirigid in nature.  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 Callus of foot   2. Hallux valgus with bunions, right   3. Metatarsus adductus of right foot   4. 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 Metatarsalgia of both feet      Plan:  Patient was evaluated and treated and all questions answered.  We again discussed his clinical findings, discussed further treatment ultimately he will let me know when he is ready to proceed with surgical intervention likely will plan for proceeding with this later this year in the fall or winter.  In the interim he would like a solution.  He is utilizing the urea cream effectively.  I recommended a custom molded orthosis with unloads on the painful callus lesions and extra PPT forefoot to offload lesions and forefoot pressures.  No follow-ups on file.

## 2022-10-11 ENCOUNTER — Telehealth: Payer: Self-pay | Admitting: Podiatry

## 2022-10-11 ENCOUNTER — Ambulatory Visit
Admission: RE | Admit: 2022-10-11 | Discharge: 2022-10-11 | Disposition: A | Discharge status: Home / Self Care | Payer: Medicare Other | Source: Ambulatory Visit | Attending: Neurosurgery | Admitting: Neurosurgery

## 2022-10-11 DIAGNOSIS — M4316 Spondylolisthesis, lumbar region: Secondary | ICD-10-CM

## 2022-10-11 DIAGNOSIS — M5412 Radiculopathy, cervical region: Secondary | ICD-10-CM | POA: Diagnosis not present

## 2022-10-11 DIAGNOSIS — M4722 Other spondylosis with radiculopathy, cervical region: Secondary | ICD-10-CM

## 2022-10-11 DIAGNOSIS — M5416 Radiculopathy, lumbar region: Secondary | ICD-10-CM | POA: Diagnosis not present

## 2022-10-11 NOTE — Telephone Encounter (Signed)
Lmom to call back to schedule time to pick up orthotics    Balance pending insurance

## 2022-10-26 ENCOUNTER — Ambulatory Visit: Payer: Medicare Other

## 2022-10-26 DIAGNOSIS — M216X1 Other acquired deformities of right foot: Secondary | ICD-10-CM

## 2022-10-26 DIAGNOSIS — Q66221 Congenital metatarsus adductus, right foot: Secondary | ICD-10-CM

## 2022-10-26 DIAGNOSIS — M21622 Bunionette of left foot: Secondary | ICD-10-CM

## 2022-10-26 DIAGNOSIS — L84 Corns and callosities: Secondary | ICD-10-CM

## 2022-10-26 DIAGNOSIS — M2011 Hallux valgus (acquired), right foot: Secondary | ICD-10-CM

## 2022-10-26 DIAGNOSIS — M7741 Metatarsalgia, right foot: Secondary | ICD-10-CM

## 2022-10-26 NOTE — Progress Notes (Unsigned)
Patient presents today to pick up custom molded foot orthotics recommended by Dr. MCDONALD.   Orthotics were dispensed and fit was satisfactory. Reviewed instructions for break-in and wear. Written instructions given to patient.  Patient will follow up as needed.   

## 2022-11-01 DIAGNOSIS — Z0001 Encounter for general adult medical examination with abnormal findings: Secondary | ICD-10-CM | POA: Diagnosis not present

## 2022-11-01 DIAGNOSIS — F419 Anxiety disorder, unspecified: Secondary | ICD-10-CM | POA: Diagnosis not present

## 2022-11-01 DIAGNOSIS — M1991 Primary osteoarthritis, unspecified site: Secondary | ICD-10-CM | POA: Diagnosis not present

## 2022-11-01 DIAGNOSIS — M5136 Other intervertebral disc degeneration, lumbar region: Secondary | ICD-10-CM | POA: Diagnosis not present

## 2022-11-01 DIAGNOSIS — Z1331 Encounter for screening for depression: Secondary | ICD-10-CM | POA: Diagnosis not present

## 2022-11-01 DIAGNOSIS — E6609 Other obesity due to excess calories: Secondary | ICD-10-CM | POA: Diagnosis not present

## 2022-11-01 DIAGNOSIS — R300 Dysuria: Secondary | ICD-10-CM | POA: Diagnosis not present

## 2022-11-01 DIAGNOSIS — E559 Vitamin D deficiency, unspecified: Secondary | ICD-10-CM | POA: Diagnosis not present

## 2022-11-01 DIAGNOSIS — E785 Hyperlipidemia, unspecified: Secondary | ICD-10-CM | POA: Diagnosis not present

## 2022-11-01 DIAGNOSIS — R7309 Other abnormal glucose: Secondary | ICD-10-CM | POA: Diagnosis not present

## 2022-11-01 DIAGNOSIS — K219 Gastro-esophageal reflux disease without esophagitis: Secondary | ICD-10-CM | POA: Diagnosis not present

## 2022-11-01 DIAGNOSIS — M549 Dorsalgia, unspecified: Secondary | ICD-10-CM | POA: Diagnosis not present

## 2022-11-01 DIAGNOSIS — Z125 Encounter for screening for malignant neoplasm of prostate: Secondary | ICD-10-CM | POA: Diagnosis not present

## 2022-11-01 DIAGNOSIS — E782 Mixed hyperlipidemia: Secondary | ICD-10-CM | POA: Diagnosis not present

## 2022-11-01 DIAGNOSIS — Z6831 Body mass index (BMI) 31.0-31.9, adult: Secondary | ICD-10-CM | POA: Diagnosis not present

## 2022-11-07 DIAGNOSIS — Z23 Encounter for immunization: Secondary | ICD-10-CM | POA: Diagnosis not present

## 2022-11-07 DIAGNOSIS — S6992XA Unspecified injury of left wrist, hand and finger(s), initial encounter: Secondary | ICD-10-CM | POA: Diagnosis not present

## 2022-11-07 DIAGNOSIS — Z87891 Personal history of nicotine dependence: Secondary | ICD-10-CM | POA: Diagnosis not present

## 2022-11-07 DIAGNOSIS — S61213A Laceration without foreign body of left middle finger without damage to nail, initial encounter: Secondary | ICD-10-CM | POA: Diagnosis not present

## 2022-11-15 DIAGNOSIS — R29898 Other symptoms and signs involving the musculoskeletal system: Secondary | ICD-10-CM | POA: Diagnosis not present

## 2022-11-15 DIAGNOSIS — M48061 Spinal stenosis, lumbar region without neurogenic claudication: Secondary | ICD-10-CM | POA: Diagnosis not present

## 2022-11-15 DIAGNOSIS — M4316 Spondylolisthesis, lumbar region: Secondary | ICD-10-CM | POA: Diagnosis not present

## 2022-11-15 DIAGNOSIS — M4722 Other spondylosis with radiculopathy, cervical region: Secondary | ICD-10-CM | POA: Diagnosis not present

## 2022-11-16 DIAGNOSIS — Z6831 Body mass index (BMI) 31.0-31.9, adult: Secondary | ICD-10-CM | POA: Diagnosis not present

## 2022-11-16 DIAGNOSIS — E6609 Other obesity due to excess calories: Secondary | ICD-10-CM | POA: Diagnosis not present

## 2022-11-16 DIAGNOSIS — S61219A Laceration without foreign body of unspecified finger without damage to nail, initial encounter: Secondary | ICD-10-CM | POA: Diagnosis not present

## 2022-11-18 DIAGNOSIS — H31001 Unspecified chorioretinal scars, right eye: Secondary | ICD-10-CM | POA: Diagnosis not present

## 2022-11-18 DIAGNOSIS — H5201 Hypermetropia, right eye: Secondary | ICD-10-CM | POA: Diagnosis not present

## 2022-11-18 DIAGNOSIS — Z6831 Body mass index (BMI) 31.0-31.9, adult: Secondary | ICD-10-CM | POA: Diagnosis not present

## 2022-11-18 DIAGNOSIS — H52201 Unspecified astigmatism, right eye: Secondary | ICD-10-CM | POA: Diagnosis not present

## 2022-11-18 DIAGNOSIS — S61219A Laceration without foreign body of unspecified finger without damage to nail, initial encounter: Secondary | ICD-10-CM | POA: Diagnosis not present

## 2022-11-18 DIAGNOSIS — E6609 Other obesity due to excess calories: Secondary | ICD-10-CM | POA: Diagnosis not present

## 2022-11-18 DIAGNOSIS — E782 Mixed hyperlipidemia: Secondary | ICD-10-CM | POA: Diagnosis not present

## 2022-11-18 DIAGNOSIS — H2513 Age-related nuclear cataract, bilateral: Secondary | ICD-10-CM | POA: Diagnosis not present

## 2022-11-18 DIAGNOSIS — H04123 Dry eye syndrome of bilateral lacrimal glands: Secondary | ICD-10-CM | POA: Diagnosis not present

## 2022-11-22 DIAGNOSIS — M5416 Radiculopathy, lumbar region: Secondary | ICD-10-CM | POA: Diagnosis not present

## 2022-12-02 ENCOUNTER — Other Ambulatory Visit: Payer: Self-pay | Admitting: Cardiovascular Disease

## 2022-12-11 DIAGNOSIS — M5136 Other intervertebral disc degeneration, lumbar region: Secondary | ICD-10-CM | POA: Diagnosis not present

## 2022-12-11 DIAGNOSIS — E782 Mixed hyperlipidemia: Secondary | ICD-10-CM | POA: Diagnosis not present

## 2022-12-11 DIAGNOSIS — K219 Gastro-esophageal reflux disease without esophagitis: Secondary | ICD-10-CM | POA: Diagnosis not present

## 2022-12-20 ENCOUNTER — Telehealth: Payer: Self-pay | Admitting: Podiatry

## 2022-12-20 DIAGNOSIS — M7742 Metatarsalgia, left foot: Secondary | ICD-10-CM

## 2022-12-20 DIAGNOSIS — M7741 Metatarsalgia, right foot: Secondary | ICD-10-CM

## 2022-12-20 NOTE — Telephone Encounter (Signed)
Pt called wanting to order a second pair of orthotics. Pt aware of cost.

## 2022-12-23 NOTE — Telephone Encounter (Signed)
These have been ordered .

## 2022-12-26 NOTE — Therapy (Signed)
OUTPATIENT PHYSICAL THERAPY THORACOLUMBAR EVALUATION   Patient Name: Patrick Mathews MRN: [id number] DOB:1951-01-17, 72 y.o., male Today's Date: 12/28/2022  END OF SESSION:  PT End of Session - 12/28/22 1001     Visit Number 1    Number of Visits 8    Date for PT Re-Evaluation 02/08/23    Authorization Type Medicare Part A    Progress Note Due on Visit 10    PT Start Time 0910    PT Stop Time 0945    PT Time Calculation (min) 35 min    Activity Tolerance Patient tolerated treatment well    Behavior During Therapy [facility] for tasks assessed/performed             Past Medical History:  Diagnosis Date   Adjacent segment disease with spinal stenosis    Arthritis    "knees; back" (07/14/2014)   Asthma    CAD (coronary artery disease)    Exertional dyspnea    GERD (gastroesophageal reflux disease)    Hyperlipidemia    Hypertension    Primary localized osteoarthritis of left knee    Primary localized osteoarthritis of right knee 05/04/2020   Seasonal allergies    Past Surgical History:  Procedure Laterality Date   ANTERIOR CERVICAL DECOMP/DISCECTOMY FUSION  1999   Dr Charna Busman SURGERY     BUNIONECTOMY Right 2013   COLONOSCOPY N/A 11/06/2012   Procedure: COLONOSCOPY;  Surgeon: Dalia Heading, MD;  Location: [facility];  Service: Gastroenterology;  Laterality: N/A;   FOOT SURGERY Right 2013   bunionectomy   KNEE ARTHROSCOPY Bilateral 1990's   POSTERIOR LUMBAR FUSION  1985; 2009   Dr Jeral Fruit   TONSILLECTOMY  1950's   TOTAL HIP ARTHROPLASTY Right 12/07/2021   Procedure: TOTAL HIP ARTHROPLASTY ANTERIOR APPROACH;  Surgeon: Sheral Apley, MD;  Location: [facility];  Service: Orthopedics;  Laterality: Right;   TOTAL KNEE ARTHROPLASTY Left 07/14/2014   Procedure: LEFT TOTAL KNEE ARTHROPLASTY;  Surgeon: Nilda Simmer, MD;  Location: [facility];  Service: Orthopedics;  Laterality: Left;   TOTAL KNEE ARTHROPLASTY Right 05/11/2020   Procedure: TOTAL KNEE ARTHROPLASTY;  Surgeon:  Salvatore Marvel, MD;  Location: [facility];  Service: Orthopedics;  Laterality: Right;   WISDOM TOOTH EXTRACTION     Patient Active Problem List   Diagnosis Date Noted   History of lumbar fusion 08/09/2022   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   CAD (coronary artery disease) 08/16/2021   DOE (dyspnea on exertion) 08/16/2021   Tremor 04/27/2021   Gait abnormality 04/27/2021   Primary localized osteoarthritis of right knee 05/04/2020   Lumbar adjacent segment disease with spondylolisthesis 02/06/2020   Chest pressure 09/27/2019   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   Neck pain 06/23/2015   Morbid obesity (HCC) 05/08/2015   DJD (degenerative joint disease) of knee 07/14/2014   Acute upper respiratory infection 06/12/2014   Cough 06/12/2014   Primary localized osteoarthritis of left knee    Hyperlipidemia    GERD (gastroesophageal reflux disease)    Back pain    Dyspnea 05/07/2014   Multiple pulmonary nodules 05/07/2014   Thoracic radiculitis 08/19/2013    PCP: Assunta Found, MD  REFERRING PROVIDER: Floreen Comber, NP (Dr. Lovell Sheehan)  REFERRING DIAG: M43.16 (ICD-10-CM) - Spondylolisthesis, lumbar region  Rationale for Evaluation and Treatment: Rehabilitation  THERAPY DIAG:  Difficulty in walking, not elsewhere classified - Plan: PT plan of care cert/re-cert  Lumbar spondylosis - Plan: PT  plan of care cert/re-cert  ONSET DATE: chronic  SUBJECTIVE:                                                                                                                                                                                           SUBJECTIVE STATEMENT: Patient reports leg weakness and balance; foot drop left foot.  Nerve ablation done in June; helped some.  Has neuropathy in feet.  Doctor says he might need another back surgery on down the road  PERTINENT HISTORY:  Cervical fusion per Dr. Jeral Fruit 1999 S/P Right hip THA 11/2021 S/p  Right TKA 2021 Has had 4 back surgeries     PAIN:  Are you having pain? Yes: NPRS scale: 0-6/10 Pain location: low back Pain description: annoying Aggravating factors: standing too long Relieving factors: rest, sit  PRECAUTIONS: None  WEIGHT BEARING RESTRICTIONS: No  FALLS:  Has patient fallen in last 6 months? No  OCCUPATION: retired  PLOF: Independent  PATIENT GOALS: get some strength in my left side; pick my feet up better  NEXT MD VISIT: prn  OBJECTIVE:   DIAGNOSTIC FINDINGS:  CLINICAL DATA:  Neck pain radiating to the posterior shoulder for 6 months   EXAM: MRI CERVICAL SPINE WITHOUT CONTRAST   TECHNIQUE: Multiplanar, multisequence MR imaging of the cervical spine was performed. No intravenous contrast was administered.   COMPARISON:  None Available.   FINDINGS: Alignment: Physiologic.   Vertebrae: No acute fracture, evidence of discitis, or aggressive bone lesion.   Cord: Normal signal and morphology.   Posterior Fossa, vertebral arteries, paraspinal tissues: Posterior fossa demonstrates no focal abnormality. Vertebral artery flow voids are maintained. Paraspinal soft tissues are unremarkable.   Disc levels:   Discs: Anterior cervical fusion at C6-7. Degenerative disease with disc height loss at C4-5, C5-6 and C6-7.   C2-3: Mild broad-based disc bulge. Mild bilateral facet arthropathy. Moderate-severe right foraminal stenosis. Moderate left foraminal stenosis. No spinal stenosis.   C3-4: Broad-based disc bulge. Moderate right and mild left facet arthropathy. Bilateral uncovertebral degenerative changes. Severe bilateral foraminal stenosis. Mild spinal stenosis.   C4-5: No disc protrusion. Mild right foraminal stenosis. No left foraminal stenosis. No spinal stenosis.   C5-6: Broad-based disc osteophyte complex. Bilateral uncovertebral degenerative changes. Moderate-severe right foraminal stenosis. Mild left foraminal stenosis. No spinal  stenosis.   C6-7: Interbody fusion.  No foraminal or central canal stenosis.   C7-T1: Broad-based disc bulge. Moderate left facet arthropathy. Moderate left foraminal stenosis. Mild-moderate right foraminal stenosis. No spinal stenosis.   IMPRESSION: 1. At C2-3 there is a mild broad-based disc bulge. Mild bilateral facet arthropathy. Moderate-severe right foraminal stenosis. Moderate  left foraminal stenosis. 2. At C3-4 there is a broad-based disc bulge. Moderate right and mild left facet arthropathy. Bilateral uncovertebral degenerative changes. Severe bilateral foraminal stenosis. Mild spinal stenosis. 3. At C5-6 there is a broad-based disc osteophyte complex. Bilateral uncovertebral degenerative changes. Moderate-severe right foraminal stenosis. Mild left foraminal stenosis. 4. At C7-T1 there is a broad-based disc bulge. Moderate left facet arthropathy. Moderate left foraminal stenosis. Mild-moderate right foraminal stenosis. 5. Anterior cervical fusion at C6-7 without foraminal or central canal stenosis.    CLINICAL DATA:  Low back pain radiating into the left leg for 6 months. History of prior lumbar surgery.   EXAM: MRI LUMBAR SPINE WITHOUT CONTRAST   TECHNIQUE: Multiplanar, multisequence MR imaging of the lumbar spine was performed. No intravenous contrast was administered.   COMPARISON:  Plain films lumbar spine 11/02/2021. MRI lumbar spine 11/28/2019.   FINDINGS: Segmentation:  Standard.   Alignment: 0.6 cm retrolisthesis L1 on L2 and 1 cm anterolisthesis L5 on S1, unchanged. Trace degenerative retrolisthesis T11 on T12 and T12 on L1 is also unchanged.   Vertebrae: No fracture, evidence of discitis, or bone lesion. Again seen is postoperative change of L2-5 discectomy and fusion.   Conus medullaris and cauda equina: Conus extends to the L1 level. Conus and cauda equina appear normal.   Paraspinal and other soft tissues: Cyst in lower pole the left kidney is  unchanged.   Disc levels:   T10-11 and T11-12 are imaged in the sagittal plane only. There is shallow disc bulging at both levels and ligamentum flavum thickening at T10-11 mild left foraminal narrowing is seen at T10-11. These levels are otherwise unremarkable. No change.   T12-L1: Minimal disc bulge without stenosis.   L1-2: Shallow disc bulge and mild facet arthropathy. Central canal is open. Mild bilateral foraminal narrowing is unchanged.   L2-3: Status post discectomy and fusion since the prior exam. The central canal and foramina are open.   L3-4: Status post discectomy and fusion as on the prior study. No stenosis.   L4-5: Status post discectomy and fusion as on the prior exam. No stenosis.   L5-S1: The disc is uncovered.  No stenosis or change.   IMPRESSION: 1. Status post L2-5 discectomy and fusion. The central canal and foramina are open at the operated levels. L2-3 fusion is new since the prior MRI. 2. No change in mild bilateral foraminal narrowing at L1-2. 3. No change in 1 cm anterolisthesis L5 on S1. No stenosis at L5-S1.    PATIENT SURVEYS:  FOTO 63   COGNITION: Overall cognitive status: Within functional limits for tasks assessed     SENSATION: Neuropathy left foot  POSTURE: flexed trunk  and right trunk shift  PALPATION: Noted decreased calf girl left versus right   LUMBAR ROM:   AROM eval  Flexion   Extension   Right lateral flexion   Left lateral flexion   Right rotation   Left rotation    (Blank rows = not tested)  LOWER EXTREMITY ROM:     Active  Right eval Left eval  Hip flexion    Hip extension    Hip abduction    Hip adduction    Hip internal rotation    Hip external rotation    Knee flexion    Knee extension  Lag noted  Ankle dorsiflexion    Ankle plantarflexion    Ankle inversion    Ankle eversion     (Blank rows = not tested)  LOWER EXTREMITY MMT:  MMT Right eval Left eval  Hip flexion 5 4+  Hip  extension    Hip abduction    Hip adduction    Hip internal rotation    Hip external rotation    Knee flexion    Knee extension 5 4  Ankle dorsiflexion 5 3-  Ankle plantarflexion    Ankle inversion    Ankle eversion     (Blank rows = not tested)   FUNCTIONAL TESTS:  5 times sit to stand: 12.4 sec no UE assist 2 minute walk test: 380 ft  SLS right 10"; left 2"  GAIT: Distance walked: 380 Assistive device utilized: None Level of assistance: Modified independence Comments: drop foot left foot; noted right trunk shift  TODAY'S TREATMENT:                                                                                                                              DATE: 12/28/2022 physical therapy evaluation and HEP instruction    PATIENT EDUCATION:  Education details: Patient educated on exam findings, POC, scope of PT, HEP, and what to expect next visit. Person educated: Patient Education method: Explanation, Demonstration, and Handouts Education comprehension: verbalized understanding, returned demonstration, verbal cues required, and tactile cues required  HOME EXERCISE PROGRAM: Access Code: [email or web address] URL: [email or web address] Date: 12/28/2022 Prepared by: [facility]  Exercises - Sit to Stand  - 2 x daily - 7 x weekly - 1 sets - 10 reps - Standing Single Leg Stance with Counter Support  - 2 x daily - 7 x weekly - 1 sets - 3 reps - 10 sec hold  ASSESSMENT:  CLINICAL IMPRESSION: Patient is a 72 y.o. male who was seen today for physical therapy evaluation and treatment for M43.16 (ICD-10-CM) - Spondylolisthesis, lumbar region.  Patient demonstrates decreased strength, balance deficits and gait abnormalities which are negatively impacting patient ability to perform ADLs and functional mobility tasks. Patient will benefit from skilled physical therapy services to address these deficits to improve level of function with ADLs, functional mobility tasks, and reduce  risk for falls.    OBJECTIVE IMPAIRMENTS: Abnormal gait, decreased activity tolerance, decreased balance, decreased endurance, decreased mobility, difficulty walking, decreased ROM, decreased strength, hypomobility, increased fascial restrictions, impaired perceived functional ability, postural dysfunction, and pain.   ACTIVITY LIMITATIONS: carrying, lifting, bending, standing, squatting, sleeping, stairs, transfers, dressing, and locomotion level  PARTICIPATION LIMITATIONS: cleaning, shopping, community activity, yard work, and Freight forwarder POTENTIAL: Good  CLINICAL DECISION MAKING: Stable/uncomplicated  EVALUATION COMPLEXITY: Low   GOALS: Goals reviewed with patient? No  SHORT TERM GOALS: Target date: 01/25/2023  patient will be independent with initial HEP   Baseline: Goal status: INITIAL  2.  Patient will self report 30% improvement to improve tolerance for functional activity  Baseline:  Goal status: INITIAL   LONG TERM GOALS: Target date: 02/08/2023  Patient will be independent in self management strategies to improve quality of life and functional outcomes.  Baseline:  Goal status: INITIAL  2.  Patient will self report 50% improvement to improve tolerance for functional activity  Baseline:  Goal status: INITIAL  3.  Patient will improve FOTO score by 10 points to demonstrate improved perceived functional mobility   Baseline: 63 Goal status: INITIAL  4.  Patient will increase left leg MMTs to 4+- 5/5 without pain to promote return to ambulation community distances with minimal deviation.  Baseline: see above Goal status: INITIAL  5.  Patient will be able to stand x 10" on his left leg to demonstrate improved functional balanve Baseline: 2" Goal status: INITIAL  6.  Patient will  increase distance on to 400 ft  to demonstrate improved functional mobility walking household and community distances.   Baseline: 380 Goal status:  INITIAL  PLAN:  PT FREQUENCY: 2x/week  PT DURATION: 4 weeks  PLANNED INTERVENTIONS: Therapeutic exercises, Therapeutic activity, Neuromuscular re-education, Balance training, Gait training, Patient/Family education, Joint manipulation, Joint mobilization, Stair training, Orthotic/Fit training, DME instructions, Aquatic Therapy, Dry Needling, Electrical stimulation, Spinal manipulation, Spinal mobilization, Cryotherapy, Moist heat, Compression bandaging, scar mobilization, Splintting, Taping, Traction, Ultrasound, Ionotophoresis 4mg /ml Dexamethasone, and Manual therapy .  PLAN FOR NEXT SESSION: Review HEP and goals; has left side weakness, foot drop; core strengthening, left leg strengthening, gait and balance training. (will be at beach next 2 weeks) please test left side hip extension and hamstring   10:11 AM, 12/28/22 Krosby Ritchie Small Jymir Dunaj MPT  physical therapy [place] [phone] Ph:[phone]

## 2022-12-28 ENCOUNTER — Other Ambulatory Visit: Payer: Self-pay

## 2022-12-28 ENCOUNTER — Ambulatory Visit (HOSPITAL_COMMUNITY): Payer: Medicare Other | Attending: Student

## 2022-12-28 DIAGNOSIS — M47816 Spondylosis without myelopathy or radiculopathy, lumbar region: Secondary | ICD-10-CM | POA: Insufficient documentation

## 2022-12-28 DIAGNOSIS — R262 Difficulty in walking, not elsewhere classified: Secondary | ICD-10-CM | POA: Insufficient documentation

## 2023-01-11 ENCOUNTER — Other Ambulatory Visit: Payer: Self-pay

## 2023-01-11 MED ORDER — LOSARTAN POTASSIUM 25 MG PO TABS: 25.0000 mg | ORAL_TABLET | Freq: Every day | ORAL | 0 refills | Status: DC

## 2023-01-16 ENCOUNTER — Ambulatory Visit (HOSPITAL_COMMUNITY): Payer: Medicare Other

## 2023-01-16 ENCOUNTER — Telehealth (HOSPITAL_COMMUNITY): Payer: Self-pay

## 2023-01-16 NOTE — Telephone Encounter (Signed)
No show, called and left message concerning missed apt today.  Reminded next apt date and time with contact number included if needs to cancel/reschedule future apts.  Ihor Austin, LPTA/CLT; Delana Meyer [phone]

## 2023-01-19 ENCOUNTER — Encounter (HOSPITAL_COMMUNITY): Payer: Self-pay

## 2023-01-19 ENCOUNTER — Ambulatory Visit (HOSPITAL_COMMUNITY): Payer: Medicare Other | Attending: Student

## 2023-01-19 DIAGNOSIS — M47816 Spondylosis without myelopathy or radiculopathy, lumbar region: Secondary | ICD-10-CM | POA: Insufficient documentation

## 2023-01-19 DIAGNOSIS — R2689 Other abnormalities of gait and mobility: Secondary | ICD-10-CM | POA: Diagnosis not present

## 2023-01-19 DIAGNOSIS — R262 Difficulty in walking, not elsewhere classified: Secondary | ICD-10-CM | POA: Insufficient documentation

## 2023-01-19 DIAGNOSIS — M6281 Muscle weakness (generalized): Secondary | ICD-10-CM | POA: Insufficient documentation

## 2023-01-19 NOTE — Therapy (Signed)
OUTPATIENT PHYSICAL THERAPY THORACOLUMBAR TREATMENT   Patient Name: Patrick Mathews MRN: [id number] DOB:19-Nov-1950, 72 y.o., male Today's Date: 01/19/2023  END OF SESSION:  PT End of Session - 01/19/23 0857     Visit Number 2    Number of Visits 8    Date for PT Re-Evaluation 02/08/23    Authorization Type Medicare Part A    Progress Note Due on Visit 10    PT Start Time 0858    PT Stop Time 0940    PT Time Calculation (min) 42 min    Activity Tolerance Patient tolerated treatment well    Behavior During Therapy [facility] for tasks assessed/performed             Past Medical History:  Diagnosis Date   Adjacent segment disease with spinal stenosis    Arthritis    "knees; back" (07/14/2014)   Asthma    CAD (coronary artery disease)    Exertional dyspnea    GERD (gastroesophageal reflux disease)    Hyperlipidemia    Hypertension    Primary localized osteoarthritis of left knee    Primary localized osteoarthritis of right knee 05/04/2020   Seasonal allergies    Past Surgical History:  Procedure Laterality Date   ANTERIOR CERVICAL DECOMP/DISCECTOMY FUSION  1999   Dr Charna Busman SURGERY     BUNIONECTOMY Right 2013   COLONOSCOPY N/A 11/06/2012   Procedure: COLONOSCOPY;  Surgeon: Dalia Heading, MD;  Location: [facility];  Service: Gastroenterology;  Laterality: N/A;   FOOT SURGERY Right 2013   bunionectomy   KNEE ARTHROSCOPY Bilateral 1990's   POSTERIOR LUMBAR FUSION  1985; 2009   Dr Jeral Fruit   TONSILLECTOMY  1950's   TOTAL HIP ARTHROPLASTY Right 12/07/2021   Procedure: TOTAL HIP ARTHROPLASTY ANTERIOR APPROACH;  Surgeon: Sheral Apley, MD;  Location: [facility];  Service: Orthopedics;  Laterality: Right;   TOTAL KNEE ARTHROPLASTY Left 07/14/2014   Procedure: LEFT TOTAL KNEE ARTHROPLASTY;  Surgeon: Nilda Simmer, MD;  Location: [facility];  Service: Orthopedics;  Laterality: Left;   TOTAL KNEE ARTHROPLASTY Right 05/11/2020   Procedure: TOTAL KNEE ARTHROPLASTY;  Surgeon:  Salvatore Marvel, MD;  Location: [facility];  Service: Orthopedics;  Laterality: Right;   WISDOM TOOTH EXTRACTION     Patient Active Problem List   Diagnosis Date Noted   History of lumbar fusion 08/09/2022   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   CAD (coronary artery disease) 08/16/2021   DOE (dyspnea on exertion) 08/16/2021   Tremor 04/27/2021   Gait abnormality 04/27/2021   Primary localized osteoarthritis of right knee 05/04/2020   Lumbar adjacent segment disease with spondylolisthesis 02/06/2020   Chest pressure 09/27/2019   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   Neck pain 06/23/2015   Morbid obesity (HCC) 05/08/2015   DJD (degenerative joint disease) of knee 07/14/2014   Acute upper respiratory infection 06/12/2014   Cough 06/12/2014   Primary localized osteoarthritis of left knee    Hyperlipidemia    GERD (gastroesophageal reflux disease)    Back pain    Dyspnea 05/07/2014   Multiple pulmonary nodules 05/07/2014   Thoracic radiculitis 08/19/2013    PCP: Assunta Found, MD  REFERRING PROVIDER: Val Eagle D, NP (Dr. Lovell Sheehan)  REFERRING DIAG: M43.16 (ICD-10-CM) - Spondylolisthesis, lumbar region  Rationale for Evaluation and Treatment: Rehabilitation  THERAPY DIAG:  Difficulty in walking, not elsewhere classified  Lumbar spondylosis  Muscle weakness (generalized)  Other abnormalities of gait and  mobility  ONSET DATE: chronic  SUBJECTIVE:                                                                                                                                                                                           SUBJECTIVE STATEMENT: 01/19/23:  Returned from the beach on Monday, had a wonderful time.  No reports of pain.  Has began the HEP regularly, stated balance is most difficult.  Eval:  Patient reports leg weakness and balance; foot drop left foot.  Nerve ablation done in June; helped some.  Has neuropathy in  feet.  Doctor says he might need another back surgery on down the road  PERTINENT HISTORY:  Cervical fusion per Dr. Jeral Fruit 1999 S/P Right hip THA 11/2021 S/p Right TKA 2021 Has had 4 back surgeries     PAIN:  Are you having pain? Yes: NPRS scale: 0-6/10 Pain location: low back Pain description: annoying Aggravating factors: standing too long Relieving factors: rest, sit  PRECAUTIONS: None  WEIGHT BEARING RESTRICTIONS: No  FALLS:  Has patient fallen in last 6 months? No  OCCUPATION: retired  PLOF: Independent  PATIENT GOALS: get some strength in my left side; pick my feet up better  NEXT MD VISIT: prn  OBJECTIVE:   DIAGNOSTIC FINDINGS:  CLINICAL DATA:  Neck pain radiating to the posterior shoulder for 6 months   EXAM: MRI CERVICAL SPINE WITHOUT CONTRAST   TECHNIQUE: Multiplanar, multisequence MR imaging of the cervical spine was performed. No intravenous contrast was administered.   COMPARISON:  None Available.   FINDINGS: Alignment: Physiologic.   Vertebrae: No acute fracture, evidence of discitis, or aggressive bone lesion.   Cord: Normal signal and morphology.   Posterior Fossa, vertebral arteries, paraspinal tissues: Posterior fossa demonstrates no focal abnormality. Vertebral artery flow voids are maintained. Paraspinal soft tissues are unremarkable.   Disc levels:   Discs: Anterior cervical fusion at C6-7. Degenerative disease with disc height loss at C4-5, C5-6 and C6-7.   C2-3: Mild broad-based disc bulge. Mild bilateral facet arthropathy. Moderate-severe right foraminal stenosis. Moderate left foraminal stenosis. No spinal stenosis.   C3-4: Broad-based disc bulge. Moderate right and mild left facet arthropathy. Bilateral uncovertebral degenerative changes. Severe bilateral foraminal stenosis. Mild spinal stenosis.   C4-5: No disc protrusion. Mild right foraminal stenosis. No left foraminal stenosis. No spinal stenosis.   C5-6:  Broad-based disc osteophyte complex. Bilateral uncovertebral degenerative changes. Moderate-severe right foraminal stenosis. Mild left foraminal stenosis. No spinal stenosis.   C6-7: Interbody fusion.  No foraminal or central canal stenosis.   C7-T1: Broad-based disc bulge. Moderate left facet arthropathy. Moderate left foraminal stenosis. Mild-moderate  right foraminal stenosis. No spinal stenosis.   IMPRESSION: 1. At C2-3 there is a mild broad-based disc bulge. Mild bilateral facet arthropathy. Moderate-severe right foraminal stenosis. Moderate left foraminal stenosis. 2. At C3-4 there is a broad-based disc bulge. Moderate right and mild left facet arthropathy. Bilateral uncovertebral degenerative changes. Severe bilateral foraminal stenosis. Mild spinal stenosis. 3. At C5-6 there is a broad-based disc osteophyte complex. Bilateral uncovertebral degenerative changes. Moderate-severe right foraminal stenosis. Mild left foraminal stenosis. 4. At C7-T1 there is a broad-based disc bulge. Moderate left facet arthropathy. Moderate left foraminal stenosis. Mild-moderate right foraminal stenosis. 5. Anterior cervical fusion at C6-7 without foraminal or central canal stenosis.    CLINICAL DATA:  Low back pain radiating into the left leg for 6 months. History of prior lumbar surgery.   EXAM: MRI LUMBAR SPINE WITHOUT CONTRAST   TECHNIQUE: Multiplanar, multisequence MR imaging of the lumbar spine was performed. No intravenous contrast was administered.   COMPARISON:  Plain films lumbar spine 11/02/2021. MRI lumbar spine 11/28/2019.   FINDINGS: Segmentation:  Standard.   Alignment: 0.6 cm retrolisthesis L1 on L2 and 1 cm anterolisthesis L5 on S1, unchanged. Trace degenerative retrolisthesis T11 on T12 and T12 on L1 is also unchanged.   Vertebrae: No fracture, evidence of discitis, or bone lesion. Again seen is postoperative change of L2-5 discectomy and fusion.   Conus  medullaris and cauda equina: Conus extends to the L1 level. Conus and cauda equina appear normal.   Paraspinal and other soft tissues: Cyst in lower pole the left kidney is unchanged.   Disc levels:   T10-11 and T11-12 are imaged in the sagittal plane only. There is shallow disc bulging at both levels and ligamentum flavum thickening at T10-11 mild left foraminal narrowing is seen at T10-11. These levels are otherwise unremarkable. No change.   T12-L1: Minimal disc bulge without stenosis.   L1-2: Shallow disc bulge and mild facet arthropathy. Central canal is open. Mild bilateral foraminal narrowing is unchanged.   L2-3: Status post discectomy and fusion since the prior exam. The central canal and foramina are open.   L3-4: Status post discectomy and fusion as on the prior study. No stenosis.   L4-5: Status post discectomy and fusion as on the prior exam. No stenosis.   L5-S1: The disc is uncovered.  No stenosis or change.   IMPRESSION: 1. Status post L2-5 discectomy and fusion. The central canal and foramina are open at the operated levels. L2-3 fusion is new since the prior MRI. 2. No change in mild bilateral foraminal narrowing at L1-2. 3. No change in 1 cm anterolisthesis L5 on S1. No stenosis at L5-S1.    PATIENT SURVEYS:  FOTO 63   COGNITION: Overall cognitive status: Within functional limits for tasks assessed     SENSATION: Neuropathy left foot  POSTURE: flexed trunk  and right trunk shift  PALPATION: Noted decreased calf girl left versus right   LUMBAR ROM:   AROM eval  Flexion   Extension   Right lateral flexion   Left lateral flexion   Right rotation   Left rotation    (Blank rows = not tested)  LOWER EXTREMITY ROM:     Active  Right eval Left eval  Hip flexion    Hip extension    Hip abduction    Hip adduction    Hip internal rotation    Hip external rotation    Knee flexion    Knee extension  Lag noted  Ankle dorsiflexion  Ankle plantarflexion    Ankle inversion    Ankle eversion     (Blank rows = not tested)  LOWER EXTREMITY MMT:    MMT Right eval Left eval Left 01/19/23 Right 01/19/23  Hip flexion 5 4+    Hip extension   2+ 4/5  Hip abduction   4/5 4/5  Hip adduction      Hip internal rotation      Hip external rotation      Knee flexion   5/5 5/5  Knee extension 5 4    Ankle dorsiflexion 5 3-    Ankle plantarflexion      Ankle inversion      Ankle eversion       (Blank rows = not tested)   FUNCTIONAL TESTS:  5 times sit to stand: 12.4 sec no UE assist 2 minute walk test: 380 ft  SLS right 10"; left 2"  GAIT: Distance walked: 380 Assistive device utilized: None Level of assistance: Modified independence Comments: drop foot left foot; noted right trunk shift  TODAY'S TREATMENT:                                                                                                                              DATE:  01/19/23: Reviewed goals  Educated importance of HEP compliance MMT for glutes and hamstrings Sidelying:  Abduction 10 Supine:  Bridge 10x5" STS no HHA 10x eccentric control Standing:  -heel raise 10x  -SLS Rt 20", Lt 4" max  - toe raise 10x  -hip abduction  -Tandem stance 12/28/2022 physical therapy evaluation and HEP instruction    PATIENT EDUCATION:  Education details: Patient educated on exam findings, POC, scope of PT, HEP, and what to expect next visit. Person educated: Patient Education method: Explanation, Demonstration, and Handouts Education comprehension: verbalized understanding, returned demonstration, verbal cues required, and tactile cues required  HOME EXERCISE PROGRAM: Access Code: [email or web address] URL: [email or web address] Date: 12/28/2022 Prepared by: [facility]  Exercises - Sit to Stand  - 2 x daily - 7 x weekly - 1 sets - 10 reps - Standing Single Leg Stance with Counter Support  - 2 x daily - 7 x weekly - 1 sets - 3 reps - 10 sec  hold  01/19/23: - Supine Bridge  - 2 x daily - 7 x weekly - 1 sets - 10 reps - 5" hold - Sidelying Hip Abduction  - 2 x daily - 7 x weekly - 1 sets - 10 reps - 5" hold - Heel Toe Raises with Counter Support  - 2 x daily - 7 x weekly - 1 sets - 10 reps - 5" hold - Standing Tandem Balance with Unilateral Counter Support  - 1 x daily - 7 x weekly - 3 sets - 3 reps - 30" hold  ASSESSMENT:  CLINICAL IMPRESSION: 01/19/23:Reviewed goals, educated importance of HEP compliance for maximal benefits.  Pt able to recall and demonstrate appropriate mechanics with  current exercise program.  MMT complete for glutes and hamstring strength with noted increased weakness Lt LE.  Added exercises to HEP to address weakness, pt able to complete all exercises with good form and no reports of pain.  Given handout for additional exercises.    Eval: Patient is a 72 y.o. male who was seen today for physical therapy evaluation and treatment for M43.16 (ICD-10-CM) - Spondylolisthesis, lumbar region.  Patient demonstrates decreased strength, balance deficits and gait abnormalities which are negatively impacting patient ability to perform ADLs and functional mobility tasks. Patient will benefit from skilled physical therapy services to address these deficits to improve level of function with ADLs, functional mobility tasks, and reduce risk for falls.    OBJECTIVE IMPAIRMENTS: Abnormal gait, decreased activity tolerance, decreased balance, decreased endurance, decreased mobility, difficulty walking, decreased ROM, decreased strength, hypomobility, increased fascial restrictions, impaired perceived functional ability, postural dysfunction, and pain.   ACTIVITY LIMITATIONS: carrying, lifting, bending, standing, squatting, sleeping, stairs, transfers, dressing, and locomotion level  PARTICIPATION LIMITATIONS: cleaning, shopping, community activity, yard work, and Freight forwarder POTENTIAL: Good  CLINICAL DECISION MAKING:  Stable/uncomplicated  EVALUATION COMPLEXITY: Low   GOALS: Goals reviewed with patient? No  SHORT TERM GOALS: Target date: 01/25/2023  patient will be independent with initial HEP   Baseline: Goal status: IN PROGRESS  2.  Patient will self report 30% improvement to improve tolerance for functional activity  Baseline:  Goal status: IIN PROGRESS   LONG TERM GOALS: Target date: 02/08/2023  Patient will be independent in self management strategies to improve quality of life and functional outcomes.  Baseline:  Goal status: IN PROGRESS  2.  Patient will self report 50% improvement to improve tolerance for functional activity  Baseline:  Goal status: IN PROGRESS  3.  Patient will improve FOTO score by 10 points to demonstrate improved perceived functional mobility   Baseline: 63 Goal status: IN PROGRESS  4.  Patient will increase left leg MMTs to 4+- 5/5 without pain to promote return to ambulation community distances with minimal deviation.  Baseline: see above Goal status: IN PROGRESS  5.  Patient will be able to stand x 10" on his left leg to demonstrate improved functional balanve Baseline: 2" Goal status: IN PROGRESS  6.  Patient will  increase distance on to 400 ft  to demonstrate improved functional mobility walking household and community distances.   Baseline: 380 Goal status: IN PROGRESS  PLAN:  PT FREQUENCY: 2x/week  PT DURATION: 4 weeks  PLANNED INTERVENTIONS: Therapeutic exercises, Therapeutic activity, Neuromuscular re-education, Balance training, Gait training, Patient/Family education, Joint manipulation, Joint mobilization, Stair training, Orthotic/Fit training, DME instructions, Aquatic Therapy, Dry Needling, Electrical stimulation, Spinal manipulation, Spinal mobilization, Cryotherapy, Moist heat, Compression bandaging, scar mobilization, Splintting, Taping, Traction, Ultrasound, Ionotophoresis 4mg /ml Dexamethasone, and Manual  therapy .  PLAN FOR NEXT SESSION:  has left side weakness, foot drop; core strengthening, left leg strengthening, gait and balance training.  Add squats, hip extension, vector stance.  Becky Sax, LPTA/CLT; CBIS [phone]  Juel Burrow, PTA 01/19/2023, 9:44 AM  9:44 AM, 01/19/23

## 2023-01-24 ENCOUNTER — Encounter (HOSPITAL_COMMUNITY): Payer: Self-pay

## 2023-01-24 ENCOUNTER — Ambulatory Visit (HOSPITAL_COMMUNITY): Payer: Medicare Other

## 2023-01-24 DIAGNOSIS — R262 Difficulty in walking, not elsewhere classified: Secondary | ICD-10-CM | POA: Diagnosis not present

## 2023-01-24 DIAGNOSIS — M6281 Muscle weakness (generalized): Secondary | ICD-10-CM

## 2023-01-24 DIAGNOSIS — M47816 Spondylosis without myelopathy or radiculopathy, lumbar region: Secondary | ICD-10-CM

## 2023-01-24 DIAGNOSIS — R2689 Other abnormalities of gait and mobility: Secondary | ICD-10-CM

## 2023-01-24 NOTE — Therapy (Signed)
OUTPATIENT PHYSICAL THERAPY THORACOLUMBAR TREATMENT   Patient Name: Patrick Mathews MRN: [id number] DOB:[birth year], 72 y.o., male Today's Date: 01/24/2023  END OF SESSION:  PT End of Session - 01/24/23 0910     Visit Number 3    Number of Visits 8    Date for PT Re-Evaluation 02/08/23    Authorization Type Medicare Part A    Progress Note Due on Visit 10    PT Start Time 0903    PT Stop Time 0941    PT Time Calculation (min) 38 min    Activity Tolerance Patient tolerated treatment well    Behavior During Therapy [facility] for tasks assessed/performed              Past Medical History:  Diagnosis Date   Adjacent segment disease with spinal stenosis    Arthritis    "knees; back" (07/14/2014)   Asthma    CAD (coronary artery disease)    Exertional dyspnea    GERD (gastroesophageal reflux disease)    Hyperlipidemia    Hypertension    Primary localized osteoarthritis of left knee    Primary localized osteoarthritis of right knee 05/04/2020   Seasonal allergies    Past Surgical History:  Procedure Laterality Date   ANTERIOR CERVICAL DECOMP/DISCECTOMY FUSION  1999   Dr Charna Busman SURGERY     BUNIONECTOMY Right 2013   COLONOSCOPY N/A 11/06/2012   Procedure: COLONOSCOPY;  Surgeon: Dalia Heading, MD;  Location: [facility];  Service: Gastroenterology;  Laterality: N/A;   FOOT SURGERY Right 2013   bunionectomy   KNEE ARTHROSCOPY Bilateral 1990's   POSTERIOR LUMBAR FUSION  1985; 2009   Dr Jeral Fruit   TONSILLECTOMY  1950's   TOTAL HIP ARTHROPLASTY Right 12/07/2021   Procedure: TOTAL HIP ARTHROPLASTY ANTERIOR APPROACH;  Surgeon: Sheral Apley, MD;  Location: [facility];  Service: Orthopedics;  Laterality: Right;   TOTAL KNEE ARTHROPLASTY Left 07/14/2014   Procedure: LEFT TOTAL KNEE ARTHROPLASTY;  Surgeon: Nilda Simmer, MD;  Location: [facility];  Service: Orthopedics;  Laterality: Left;   TOTAL KNEE ARTHROPLASTY Right 05/11/2020   Procedure: TOTAL KNEE ARTHROPLASTY;   Surgeon: Salvatore Marvel, MD;  Location: [facility];  Service: Orthopedics;  Laterality: Right;   WISDOM TOOTH EXTRACTION     Patient Active Problem List   Diagnosis Date Noted   History of lumbar fusion 08/09/2022   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   CAD (coronary artery disease) 08/16/2021   DOE (dyspnea on exertion) 08/16/2021   Tremor 04/27/2021   Gait abnormality 04/27/2021   Primary localized osteoarthritis of right knee 05/04/2020   Lumbar adjacent segment disease with spondylolisthesis 02/06/2020   Chest pressure 09/27/2019   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   Neck pain 06/23/2015   Morbid obesity (HCC) 05/08/2015   DJD (degenerative joint disease) of knee 07/14/2014   Acute upper respiratory infection 06/12/2014   Cough 06/12/2014   Primary localized osteoarthritis of left knee    Hyperlipidemia    GERD (gastroesophageal reflux disease)    Back pain    Dyspnea 05/07/2014   Multiple pulmonary nodules 05/07/2014   Thoracic radiculitis 08/19/2013    PCP: Assunta Found, MD  REFERRING PROVIDER: Val Eagle D, NP (Dr. Lovell Sheehan)  REFERRING DIAG: M43.16 (ICD-10-CM) - Spondylolisthesis, lumbar region  Rationale for Evaluation and Treatment: Rehabilitation  THERAPY DIAG:  Difficulty in walking, not elsewhere classified  Lumbar spondylosis  Muscle weakness (generalized)  Other abnormalities of gait  and mobility  ONSET DATE: chronic  SUBJECTIVE:                                                                                                                                                                                           SUBJECTIVE STATEMENT: 01/24/23:  Pt stated he had to remove a pecan tree from building yesterday, reports he is sore today.  Current pain scale 5/10 LBP.  Reports he has been compliant with HEP.  Eval:  Patient reports leg weakness and balance; foot drop left foot.  Nerve ablation done in June;  helped some.  Has neuropathy in feet.  Doctor says he might need another back surgery on down the road  PERTINENT HISTORY:  Cervical fusion per Dr. Jeral Fruit 1999 S/P Right hip THA 11/2021 S/p Right TKA 2021 Has had 4 back surgeries     PAIN:  Are you having pain? Yes: NPRS scale: 0-6/10 Pain location: low back Pain description: annoying Aggravating factors: standing too long Relieving factors: rest, sit  PRECAUTIONS: None  WEIGHT BEARING RESTRICTIONS: No  FALLS:  Has patient fallen in last 6 months? No  OCCUPATION: retired  PLOF: Independent  PATIENT GOALS: get some strength in my left side; pick my feet up better  NEXT MD VISIT: prn  OBJECTIVE:   DIAGNOSTIC FINDINGS:  CLINICAL DATA:  Neck pain radiating to the posterior shoulder for 6 months   EXAM: MRI CERVICAL SPINE WITHOUT CONTRAST   TECHNIQUE: Multiplanar, multisequence MR imaging of the cervical spine was performed. No intravenous contrast was administered.   COMPARISON:  None Available.   FINDINGS: Alignment: Physiologic.   Vertebrae: No acute fracture, evidence of discitis, or aggressive bone lesion.   Cord: Normal signal and morphology.   Posterior Fossa, vertebral arteries, paraspinal tissues: Posterior fossa demonstrates no focal abnormality. Vertebral artery flow voids are maintained. Paraspinal soft tissues are unremarkable.   Disc levels:   Discs: Anterior cervical fusion at C6-7. Degenerative disease with disc height loss at C4-5, C5-6 and C6-7.   C2-3: Mild broad-based disc bulge. Mild bilateral facet arthropathy. Moderate-severe right foraminal stenosis. Moderate left foraminal stenosis. No spinal stenosis.   C3-4: Broad-based disc bulge. Moderate right and mild left facet arthropathy. Bilateral uncovertebral degenerative changes. Severe bilateral foraminal stenosis. Mild spinal stenosis.   C4-5: No disc protrusion. Mild right foraminal stenosis. No left foraminal stenosis. No  spinal stenosis.   C5-6: Broad-based disc osteophyte complex. Bilateral uncovertebral degenerative changes. Moderate-severe right foraminal stenosis. Mild left foraminal stenosis. No spinal stenosis.   C6-7: Interbody fusion.  No foraminal or central canal stenosis.   C7-T1: Broad-based disc bulge. Moderate left facet  arthropathy. Moderate left foraminal stenosis. Mild-moderate right foraminal stenosis. No spinal stenosis.   IMPRESSION: 1. At C2-3 there is a mild broad-based disc bulge. Mild bilateral facet arthropathy. Moderate-severe right foraminal stenosis. Moderate left foraminal stenosis. 2. At C3-4 there is a broad-based disc bulge. Moderate right and mild left facet arthropathy. Bilateral uncovertebral degenerative changes. Severe bilateral foraminal stenosis. Mild spinal stenosis. 3. At C5-6 there is a broad-based disc osteophyte complex. Bilateral uncovertebral degenerative changes. Moderate-severe right foraminal stenosis. Mild left foraminal stenosis. 4. At C7-T1 there is a broad-based disc bulge. Moderate left facet arthropathy. Moderate left foraminal stenosis. Mild-moderate right foraminal stenosis. 5. Anterior cervical fusion at C6-7 without foraminal or central canal stenosis.    CLINICAL DATA:  Low back pain radiating into the left leg for 6 months. History of prior lumbar surgery.   EXAM: MRI LUMBAR SPINE WITHOUT CONTRAST   TECHNIQUE: Multiplanar, multisequence MR imaging of the lumbar spine was performed. No intravenous contrast was administered.   COMPARISON:  Plain films lumbar spine 11/02/2021. MRI lumbar spine 11/28/2019.   FINDINGS: Segmentation:  Standard.   Alignment: 0.6 cm retrolisthesis L1 on L2 and 1 cm anterolisthesis L5 on S1, unchanged. Trace degenerative retrolisthesis T11 on T12 and T12 on L1 is also unchanged.   Vertebrae: No fracture, evidence of discitis, or bone lesion. Again seen is postoperative change of L2-5 discectomy and  fusion.   Conus medullaris and cauda equina: Conus extends to the L1 level. Conus and cauda equina appear normal.   Paraspinal and other soft tissues: Cyst in lower pole the left kidney is unchanged.   Disc levels:   T10-11 and T11-12 are imaged in the sagittal plane only. There is shallow disc bulging at both levels and ligamentum flavum thickening at T10-11 mild left foraminal narrowing is seen at T10-11. These levels are otherwise unremarkable. No change.   T12-L1: Minimal disc bulge without stenosis.   L1-2: Shallow disc bulge and mild facet arthropathy. Central canal is open. Mild bilateral foraminal narrowing is unchanged.   L2-3: Status post discectomy and fusion since the prior exam. The central canal and foramina are open.   L3-4: Status post discectomy and fusion as on the prior study. No stenosis.   L4-5: Status post discectomy and fusion as on the prior exam. No stenosis.   L5-S1: The disc is uncovered.  No stenosis or change.   IMPRESSION: 1. Status post L2-5 discectomy and fusion. The central canal and foramina are open at the operated levels. L2-3 fusion is new since the prior MRI. 2. No change in mild bilateral foraminal narrowing at L1-2. 3. No change in 1 cm anterolisthesis L5 on S1. No stenosis at L5-S1.    PATIENT SURVEYS:  FOTO 63   COGNITION: Overall cognitive status: Within functional limits for tasks assessed     SENSATION: Neuropathy left foot  POSTURE: flexed trunk  and right trunk shift  PALPATION: Noted decreased calf girl left versus right   LUMBAR ROM:   AROM eval  Flexion   Extension   Right lateral flexion   Left lateral flexion   Right rotation   Left rotation    (Blank rows = not tested)  LOWER EXTREMITY ROM:     Active  Right eval Left eval  Hip flexion    Hip extension    Hip abduction    Hip adduction    Hip internal rotation    Hip external rotation    Knee flexion    Knee extension  Lag noted  Ankle  dorsiflexion    Ankle plantarflexion    Ankle inversion    Ankle eversion     (Blank rows = not tested)  LOWER EXTREMITY MMT:    MMT Right eval Left eval Left 01/19/23 Right 01/19/23  Hip flexion 5 4+    Hip extension   2+ 4/5  Hip abduction   4/5 4/5  Hip adduction      Hip internal rotation      Hip external rotation      Knee flexion   5/5 5/5  Knee extension 5 4    Ankle dorsiflexion 5 3-    Ankle plantarflexion      Ankle inversion      Ankle eversion       (Blank rows = not tested)   FUNCTIONAL TESTS:  5 times sit to stand: 12.4 sec no UE assist 2 minute walk test: 380 ft  SLS right 10"; left 2"  GAIT: Distance walked: 380 Assistive device utilized: None Level of assistance: Modified independence Comments: drop foot left foot; noted right trunk shift  TODAY'S TREATMENT:                                                                                                                              DATE:  01/24/23: STS no HHA 10x eccentric control Standing:  -lumbar extension 5x  -rotation 10x  -squat 10x front of chair  -tandem stance 3x 30"  -vector stance 3x 5" Prone:  -POE 2 min  - hip extension 2 sets x 5 Supine:  -Bridge  -LTR 5x 10"    01/19/23: Reviewed goals  Educated importance of HEP compliance MMT for glutes and hamstrings Sidelying:  Abduction 10 Supine:  Bridge 10x5" STS no HHA 10x eccentric control Standing:  -heel raise 10x  -SLS Rt 20", Lt 4" max  - toe raise 10x  -hip abduction  -Tandem stance 12/28/2022 physical therapy evaluation and HEP instruction    PATIENT EDUCATION:  Education details: Patient educated on exam findings, POC, scope of PT, HEP, and what to expect next visit. Person educated: Patient Education method: Explanation, Demonstration, and Handouts Education comprehension: verbalized understanding, returned demonstration, verbal cues required, and tactile cues required  HOME EXERCISE PROGRAM: Access Code:  [email or web address] URL: [email or web address] Date: 12/28/2022 Prepared by: [facility]  Exercises - Sit to Stand  - 2 x daily - 7 x weekly - 1 sets - 10 reps - Standing Single Leg Stance with Counter Support  - 2 x daily - 7 x weekly - 1 sets - 3 reps - 10 sec hold  01/19/23: - Supine Bridge  - 2 x daily - 7 x weekly - 1 sets - 10 reps - 5" hold - Sidelying Hip Abduction  - 2 x daily - 7 x weekly - 1 sets - 10 reps - 5" hold - Heel Toe Raises with Counter Support  - 2 x daily -  7 x weekly - 1 sets - 10 reps - 5" hold - Standing Tandem Balance with Unilateral Counter Support  - 1 x daily - 7 x weekly - 3 sets - 3 reps - 30" hold  01/24/23: -squat -vector stance -LTR  ASSESSMENT:  CLINICAL IMPRESSION: 01/24/23:  Session focus with mobility and proximal strengthening.  Added stretches and gluteal strengthening exercises with positive results.  Pt most limited with balance activities, required intermittent HHA.  Reports of pain reduced at EOS and improved flexibilty.  Added squats, vector stance and LTR to HEP with printout given.  Eval: Patient is a 72 y.o. male who was seen today for physical therapy evaluation and treatment for M43.16 (ICD-10-CM) - Spondylolisthesis, lumbar region.  Patient demonstrates decreased strength, balance deficits and gait abnormalities which are negatively impacting patient ability to perform ADLs and functional mobility tasks. Patient will benefit from skilled physical therapy services to address these deficits to improve level of function with ADLs, functional mobility tasks, and reduce risk for falls.    OBJECTIVE IMPAIRMENTS: Abnormal gait, decreased activity tolerance, decreased balance, decreased endurance, decreased mobility, difficulty walking, decreased ROM, decreased strength, hypomobility, increased fascial restrictions, impaired perceived functional ability, postural dysfunction, and pain.   ACTIVITY LIMITATIONS: carrying, lifting, bending,  standing, squatting, sleeping, stairs, transfers, dressing, and locomotion level  PARTICIPATION LIMITATIONS: cleaning, shopping, community activity, yard work, and Freight forwarder POTENTIAL: Good  CLINICAL DECISION MAKING: Stable/uncomplicated  EVALUATION COMPLEXITY: Low   GOALS: Goals reviewed with patient? No  SHORT TERM GOALS: Target date: 01/25/2023  patient will be independent with initial HEP   Baseline: Goal status: IN PROGRESS  2.  Patient will self report 30% improvement to improve tolerance for functional activity  Baseline:  Goal status: IIN PROGRESS   LONG TERM GOALS: Target date: 02/08/2023  Patient will be independent in self management strategies to improve quality of life and functional outcomes.  Baseline:  Goal status: IN PROGRESS  2.  Patient will self report 50% improvement to improve tolerance for functional activity  Baseline:  Goal status: IN PROGRESS  3.  Patient will improve FOTO score by 10 points to demonstrate improved perceived functional mobility   Baseline: 63 Goal status: IN PROGRESS  4.  Patient will increase left leg MMTs to 4+- 5/5 without pain to promote return to ambulation community distances with minimal deviation.  Baseline: see above Goal status: IN PROGRESS  5.  Patient will be able to stand x 10" on his left leg to demonstrate improved functional balanve Baseline: 2" Goal status: IN PROGRESS  6.  Patient will  increase distance on to 400 ft  to demonstrate improved functional mobility walking household and community distances.   Baseline: 380 Goal status: IN PROGRESS  PLAN:  PT FREQUENCY: 2x/week  PT DURATION: 4 weeks  PLANNED INTERVENTIONS: Therapeutic exercises, Therapeutic activity, Neuromuscular re-education, Balance training, Gait training, Patient/Family education, Joint manipulation, Joint mobilization, Stair training, Orthotic/Fit training, DME instructions, Aquatic Therapy, Dry Needling,  Electrical stimulation, Spinal manipulation, Spinal mobilization, Cryotherapy, Moist heat, Compression bandaging, scar mobilization, Splintting, Taping, Traction, Ultrasound, Ionotophoresis 4mg /ml Dexamethasone, and Manual therapy .  PLAN FOR NEXT SESSION:  has left side weakness, foot drop; core strengthening, left leg strengthening, gait and balance training.  Add sidestep next session.  Becky Sax, LPTA/CLT; CBIS [phone]  Juel Burrow, PTA 01/24/2023, 9:53 AM  9:53 AM, 01/24/23

## 2023-01-26 ENCOUNTER — Encounter (HOSPITAL_COMMUNITY): Payer: Self-pay

## 2023-01-26 ENCOUNTER — Ambulatory Visit (HOSPITAL_COMMUNITY): Payer: Medicare Other

## 2023-01-26 DIAGNOSIS — R2689 Other abnormalities of gait and mobility: Secondary | ICD-10-CM

## 2023-01-26 DIAGNOSIS — M6281 Muscle weakness (generalized): Secondary | ICD-10-CM

## 2023-01-26 DIAGNOSIS — M47816 Spondylosis without myelopathy or radiculopathy, lumbar region: Secondary | ICD-10-CM

## 2023-01-26 DIAGNOSIS — R262 Difficulty in walking, not elsewhere classified: Secondary | ICD-10-CM
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
## 2023-01-30 ENCOUNTER — Encounter (HOSPITAL_COMMUNITY): Payer: Medicare Other

## 2023-01-30 DIAGNOSIS — M25551 Pain in right hip: Secondary | ICD-10-CM | POA: Diagnosis not present

## 2023-02-01 ENCOUNTER — Encounter (HOSPITAL_COMMUNITY): Payer: Medicare Other

## 2023-02-03 DIAGNOSIS — M4316 Spondylolisthesis, lumbar region: Secondary | ICD-10-CM | POA: Diagnosis not present

## 2023-02-06 ENCOUNTER — Telehealth: Payer: Self-pay

## 2023-02-06 DIAGNOSIS — L720 Epidermal cyst: Secondary | ICD-10-CM | POA: Diagnosis not present

## 2023-02-06 DIAGNOSIS — L570 Actinic keratosis: Secondary | ICD-10-CM | POA: Diagnosis not present

## 2023-02-06 DIAGNOSIS — X32XXXD Exposure to sunlight, subsequent encounter: Secondary | ICD-10-CM | POA: Diagnosis not present

## 2023-02-06 NOTE — Telephone Encounter (Signed)
   Name: Patrick Mathews  DOB: [birth year]  MRN: [id number]  Primary Cardiologist: Kristeen Miss, MD  Chart reviewed as part of pre-operative protocol coverage. Because of DEZION JEANLOUIS past medical history and time since last visit, he will require a follow-up in-office visit in order to better assess preoperative cardiovascular risk.  Pre-op covering staff: - Please schedule appointment and call patient to inform them. If patient already had an upcoming appointment within acceptable timeframe, please add "pre-op clearance" to the appointment notes so provider is aware. - Please contact requesting surgeon's office via preferred method (i.e, phone, fax) to inform them of need for appointment prior to surgery.   Carlos Levering, NP  02/06/2023, 3:50 PM

## 2023-02-06 NOTE — Telephone Encounter (Signed)
Left message to  call back to schedule an IN OFFICE appt for pre op clearance.  

## 2023-02-06 NOTE — Telephone Encounter (Signed)
...     Pre-operative Risk Assessment    Patient Name: Patrick Mathews  DOB: Feb 17, 1951 MRN: [id number]  LAST O/V 10/11/21 NO APPT SCHEDULED    Request for Surgical Clearance    Procedure:   LUMBAR FUSION  Date of Surgery:  Clearance TBD                                 Surgeon:  DR Peggye Ley Surgeon's Group or Practice Name:  [facility] Phone number:  [phone] Fax number:  [phone]   Type of Clearance Requested:   - Medical  - Pharmacy:  Hold Aspirin     Type of Anesthesia:  General    Additional requests/questions:    Jola Babinski   02/06/2023, 2:32 PM

## 2023-02-07 ENCOUNTER — Encounter (HOSPITAL_COMMUNITY): Payer: Medicare Other

## 2023-02-07 NOTE — Telephone Encounter (Signed)
Pt is scheduled for clearance 09/10 with Tereso Newcomer. I will forward to him.

## 2023-02-09 ENCOUNTER — Encounter (HOSPITAL_COMMUNITY): Payer: Medicare Other

## 2023-02-09 ENCOUNTER — Telehealth (HOSPITAL_COMMUNITY): Payer: Self-pay

## 2023-02-09 NOTE — Telephone Encounter (Signed)
Spoke with patient on the phone; he states he is at the beach this week and thought he had informed us of that.  Will call us to schedule when he returns.     9:18 AM, 02/09/23 Jabar Krysiak Small Cayson Kalb MPT [facility]  [phone]

## 2023-02-21 ENCOUNTER — Ambulatory Visit: Payer: Medicare Other | Attending: Physician Assistant | Admitting: Physician Assistant

## 2023-02-21 ENCOUNTER — Encounter: Payer: Self-pay | Admitting: Physician Assistant

## 2023-02-21 VITALS — BP 108/62 | HR 60 | Ht 70.0 in | Wt 212.8 lb

## 2023-02-21 DIAGNOSIS — I77810 Thoracic aortic ectasia: Secondary | ICD-10-CM | POA: Insufficient documentation

## 2023-02-21 DIAGNOSIS — E782 Mixed hyperlipidemia: Secondary | ICD-10-CM | POA: Insufficient documentation

## 2023-02-21 DIAGNOSIS — Z01810 Encounter for preprocedural cardiovascular examination: Secondary | ICD-10-CM | POA: Diagnosis not present

## 2023-02-21 DIAGNOSIS — I351 Nonrheumatic aortic (valve) insufficiency: Secondary | ICD-10-CM | POA: Insufficient documentation

## 2023-02-21 DIAGNOSIS — I2510 Atherosclerotic heart disease of native coronary artery without angina pectoris: Secondary | ICD-10-CM | POA: Diagnosis not present

## 2023-02-21 DIAGNOSIS — I10 Essential (primary) hypertension: Secondary | ICD-10-CM | POA: Diagnosis not present

## 2023-02-21 HISTORY — DX: Thoracic aortic ectasia: I77.810

## 2023-02-21 HISTORY — DX: Nonrheumatic aortic (valve) insufficiency: I35.1

## 2023-02-21 MED ORDER — ROSUVASTATIN CALCIUM 10 MG PO TABS: ORAL_TABLET | ORAL | 3 refills | Status: DC

## 2023-02-21 NOTE — Patient Instructions (Signed)
Medication Instructions:  Start Aspirin 81 mg daily after you have your surgery  Start Rosuvastatin 10 mg 3 times a week, Monday, Wednesday, Friday (you can start this after your surgery)   *If you need a refill on your cardiac medications before your next appointment, please call your pharmacy*   Lab Work: Your physician recommends that you return for a FASTING lipid profile and comprehensive metabolic panel in 6 months   If you have labs (blood work) drawn today and your tests are completely normal, you will receive your results only by: MyChart Message (if you have MyChart) OR A paper copy in the mail If you have any lab test that is abnormal or we need to change your treatment, we will call you to review the results.   Testing/Procedures: Your physician has requested that you have an echocardiogram as soon as possible due to upcoming surgery. Echocardiography is a painless test that uses sound waves to create images of your heart. It provides your doctor with information about the size and shape of your heart and how well your heart's chambers and valves are working. This procedure takes approximately one hour. There are no restrictions for this procedure. Please do NOT wear cologne, perfume, aftershave, or lotions (deodorant is allowed). Please arrive 15 minutes prior to your appointment time.    Follow-Up: At [facility], you and your health needs are our priority.  As part of our continuing mission to provide you with exceptional heart care, we have created designated Provider Care Teams.  These Care Teams include your primary Cardiologist (physician) and Advanced Practice Providers (APPs -  Physician Assistants and Nurse Practitioners) who all work together to provide you with the care you need, when you need it.  We recommend signing up for the patient portal called "MyChart".  Sign up information is provided on this After Visit Summary.  MyChart is used to connect with  patients for Virtual Visits (Telemedicine).  Patients are able to view lab/test results, encounter notes, upcoming appointments, etc.  Non-urgent messages can be sent to your provider as well.   To learn more about what you can do with MyChart, go to [email or web address].    Your next appointment:   12 month(s)  Provider:   Kristeen Miss, MD     Other Instructions

## 2023-02-21 NOTE — Telephone Encounter (Signed)
Notes sent to surgeon. Tereso Newcomer, PA-C    02/21/2023 5:59 PM

## 2023-02-21 NOTE — Progress Notes (Addendum)
Cardiology Office Note:    Date:  02/21/2023  ID:  Patrick Mathews, DOB 1950/11/26, MRN [id number] PCP: Assunta Found, MD  [facility] Providers Cardiologist:  Kristeen Miss, MD       Patient Profile:      Coronary artery disease  Myoview 07/22/2019: EF 70, no ischemia  CCTA 05/24/2021: CAC score 69 (38 percentile); LAD 1-24, LCx 1-24; ascending aorta 39 mm Aortic insufficiency TTE 05/25/2021: EF 55-60, no RWMA, GLS -17.9, normal RVSF, mild LAE, trivial MR, mild to moderate AI, AV sclerosis, mild dilation of aortic root (38 mm), mild dilation of ascending aorta (41 mm), RAP 3 Mild ascending aorta dilation TTE 05/2021: 41 mm; CT 05/2021: 39 mm Hyperlipidemia  Hypertension           History of Present Illness:   Patrick Mathews is a 72 y.o. male who returns for Surgical clearance.  He needs to undergo lumbar fusion with Dr. Lovell Sheehan.  This will be a general anesthesia and request is to hold aspirin. He was last seen by Dr. Elease Hashimoto in March 2023.  He is here today with his wife.  He has been limited by his back pain.  He has radicular symptoms into his left leg.  He has had multiple surgeries.  He had COVID 2 years ago.  Afterwards, he was given opiates for orthopedic pain and had a significant reaction to this.  He has had fairly chronic chest discomfort with exertion from time to time since then.  This has not changed.  He does note difficulty with chronic fatigue that he relates to his orthopedic issues as well as the reaction to opiates.  He has not had syncope, orthopnea.  He has chronic leg edema.  It is worse on the left and does improve somewhat with elevation.   ROS: See HPI.    Studies Reviewed:   EKG Interpretation Date/Time:  Tuesday February 21 2023 14:08:45 EDT Ventricular Rate:  60 PR Interval:  174 QRS Duration:  132 QT Interval:  438 QTC Calculation: 438 R Axis:   -78  Text Interpretation: Sinus rhythm with occasional Premature ventricular complexes Right  bundle branch block Left anterior fasicular block No significant change since last tracing Confirmed by Tereso Newcomer [phone]) on 02/21/2023 2:17:04 PM    Risk Assessment/Calculations:             Physical Exam:   VS:  BP 108/62   Pulse 60   Ht 5\' 10"  (1.778 m)   Wt 212 lb 12.8 oz (96.5 kg)   SpO2 97%   BMI 30.53 kg/m    Wt Readings from Last 3 Encounters:  02/21/23 212 lb 12.8 oz (96.5 kg)  12/07/21 207 lb 3.7 oz (94 kg)  11/24/21 207 lb (93.9 kg)    Constitutional:      Appearance: Healthy appearance. Not in distress.  Neck:     Vascular: JVD normal.  Pulmonary:     Breath sounds: Normal breath sounds. No wheezing. No rales.  Cardiovascular:     Normal rate. Regular rhythm.     Murmurs: There is no murmur.  Edema:    Peripheral edema present.    Pretibial: bilateral trace edema of the pretibial area. Abdominal:     Palpations: Abdomen is soft.        Assessment and Plan:   Surgical clearance   Mr. Patrick Mathews has a history of chronic chest discomfort for the past couple of years.  This is unchanged.  He had  a coronary CTA in December 2022 that demonstrated minimal nonobstructive CAD.  He does have mild to moderate aortic insufficiency noted on echocardiogram in December 2022.  He admits to fatigue and shortness of breath with some activities.  He relates this all to his orthopedic issues.  Overall, his perioperative risk of a major cardiac event is 0.9% according to the Revised Cardiac Risk Index (RCRI).  Therefore, he is at low risk for perioperative complications.   His functional capacity is fair at 4.31 METs according to the Duke Activity Status Index (DASI). Recommendations: Given his aortic insufficiency, the patient requires an echocardiogram before a disposition can be made regarding surgical risk.                Antiplatelet and/or Anticoagulation Recommendations: Aspirin can be held for 7 days prior to his surgery.  Please resume Aspirin post operatively when it is felt  to be safe from a bleeding standpoint.   Addendum 03/06/2023: Echocardiogram 03/06/2023: EF 55-60, no RWMA, mild LVH, GR 1 DD, normal RVSF, mild AI, mild dilation of ascending aorta (40 mm).  Aortic insufficiency is mild. No further testing needed. The patient can proceed with his procedure at acceptable CV risk.  Coronary artery disease Minimal nonobstructive CAD on coronary CTA in December 2022.  He has chronic chest symptoms.  These have been ongoing for 2 years without significant change.  His electrocardiogram does not demonstrate any significant changes.  He stopped taking aspirin and statin therapy on his own.  I have asked him to resume aspirin 81 mg daily after his surgery is completed.  I have also asked him to resume statin therapy after his surgery is completed.  Hyperlipidemia Goal LDL is <70.  After surgery is complete, start rosuvastatin 10 mg every Monday, Wednesday, Friday.  Plan follow-up lipids and c-Met in 6 months.  Hypertension Controlled.  Continue losartan 25 mg daily.  Aortic insufficiency Mild to moderate by echocardiogram December 2022.  Arrange follow-up echocardiogram as noted.  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  As noted repeat echocardiogram will be obtained.       Dispo:  Return in about 1 year (around 02/21/2024) for Routine Follow Up, w/ Dr. Elease Hashimoto.  Signed, Tereso Newcomer, PA-C

## 2023-03-06 ENCOUNTER — Ambulatory Visit (HOSPITAL_COMMUNITY): Payer: Medicare Other | Attending: Physician Assistant

## 2023-03-06 DIAGNOSIS — I351 Nonrheumatic aortic (valve) insufficiency: Secondary | ICD-10-CM | POA: Diagnosis not present

## 2023-03-06 DIAGNOSIS — Z01810 Encounter for preprocedural cardiovascular examination: Secondary | ICD-10-CM | POA: Diagnosis not present

## 2023-03-06 LAB — ECHOCARDIOGRAM COMPLETE
Area-P 1/2: 3.31 cm2
P 1/2 time: 674 msec
S' Lateral: 3.6 cm

## 2023-03-07 ENCOUNTER — Telehealth: Payer: Self-pay | Admitting: *Deleted

## 2023-03-07 DIAGNOSIS — I77810 Thoracic aortic ectasia: Secondary | ICD-10-CM

## 2023-03-07 DIAGNOSIS — I2510 Atherosclerotic heart disease of native coronary artery without angina pectoris: Secondary | ICD-10-CM

## 2023-03-07 NOTE — Telephone Encounter (Signed)
-----   Message from Tereso Newcomer sent at 03/06/2023  5:29 PM EDT ----- Results sent to Hildred Priest via MyChart. See MyChart comments below. I will send a copy to Assunta Found, MD as Lorain Childes. PLAN:  -Echocardiogram 1 year (Dx: dilated ascending aorta)  Mr. Seppala  Your echocardiogram demonstrates normal ejection fraction (heart function).  There is mild aortic insufficiency (leakage).  There is mild dilation (enlargement) of the ascending aorta.  Compared to the echocardiogram in December 2022, there have been no significant changes. Continue current medications/treatment plan and follow up as scheduled.  We will repeat your echocardiogram again in 1 year. Tereso Newcomer, PA-C

## 2023-03-10 DIAGNOSIS — G4739 Other sleep apnea: Secondary | ICD-10-CM | POA: Diagnosis not present

## 2023-03-10 DIAGNOSIS — Z6831 Body mass index (BMI) 31.0-31.9, adult: Secondary | ICD-10-CM | POA: Diagnosis not present

## 2023-03-10 DIAGNOSIS — E6609 Other obesity due to excess calories: Secondary | ICD-10-CM | POA: Diagnosis not present

## 2023-03-10 DIAGNOSIS — Z01818 Encounter for other preprocedural examination: Secondary | ICD-10-CM | POA: Diagnosis not present

## 2023-03-10 DIAGNOSIS — K219 Gastro-esophageal reflux disease without esophagitis: Secondary | ICD-10-CM | POA: Diagnosis not present

## 2023-03-10 DIAGNOSIS — F419 Anxiety disorder, unspecified: Secondary | ICD-10-CM | POA: Diagnosis not present

## 2023-03-10 DIAGNOSIS — R791 Abnormal coagulation profile: Secondary | ICD-10-CM | POA: Diagnosis not present

## 2023-03-10 DIAGNOSIS — M5136 Other intervertebral disc degeneration, lumbar region: Secondary | ICD-10-CM | POA: Diagnosis not present

## 2023-03-10 DIAGNOSIS — E782 Mixed hyperlipidemia: Secondary | ICD-10-CM | POA: Diagnosis not present

## 2023-03-10 NOTE — Progress Notes (Signed)
Pt has been made aware of normal result and verbalized understanding.  jw

## 2023-04-03 ENCOUNTER — Other Ambulatory Visit: Payer: Self-pay | Admitting: Neurosurgery

## 2023-04-04 ENCOUNTER — Other Ambulatory Visit: Payer: Self-pay | Admitting: Neurosurgery

## 2023-04-17 ENCOUNTER — Other Ambulatory Visit: Payer: Self-pay | Admitting: Neurosurgery

## 2023-04-19 NOTE — Progress Notes (Signed)
Surgical Instructions   Your procedure is scheduled on Wednesday, April 26, 2023. Report to [facility] Main Entrance "A" at 6:30 A.M., then check in with the Admitting office. Any questions or running late day of surgery: call [phone]  Questions prior to your surgery date: call [phone], Monday-Friday, 8am-4pm. If you experience any cold or flu symptoms such as cough, fever, chills, shortness of breath, etc. between now and your scheduled surgery, please notify us at the above number.     Remember:  Do not eat or drink after midnight the night before your surgery   Take these medicines the morning of surgery with A SIP OF WATER  pantoprazole (PROTONIX)   May take these medicines IF NEEDED: acetaminophen (TYLENOL)  ondansetron (ZOFRAN)  VENTOLIN HFA 108 (90 Base) MCG/ACT inhaler.  Please bring with you to the hospital   One week prior to surgery, STOP taking any Aspirin (unless otherwise instructed by your surgeon) Aleve, Naproxen, Ibuprofen, Motrin, Advil, Goody's, BC's, all herbal medications, fish oil, and non-prescription vitamins.                     Do NOT Smoke (Tobacco/Vaping) for 24 hours prior to your procedure.  If you use a CPAP at night, you may bring your mask/headgear for your overnight stay.   You will be asked to remove any contacts, glasses, piercing's, hearing aid's, dentures/partials prior to surgery. Please bring cases for these items if needed.    Patients discharged the day of surgery will not be allowed to drive home, and someone needs to stay with them for 24 hours.  SURGICAL WAITING ROOM VISITATION Patients may have no more than 2 support people in the waiting area - these visitors may rotate.   Pre-op nurse will coordinate an appropriate time for 1 ADULT support person, who may not rotate, to accompany patient in pre-op.  Children under the age of 79 must have an adult with them who is not the patient and must remain in the main waiting area  with an adult.  If the patient needs to stay at the hospital during part of their recovery, the visitor guidelines for inpatient rooms apply.  Please refer to the [facility] website for the visitor guidelines for any additional information.   If you received a COVID test during your pre-op visit  it is requested that you wear a mask when out in public, stay away from anyone that may not be feeling well and notify your surgeon if you develop symptoms. If you have been in contact with anyone that has tested positive in the last 10 days please notify you surgeon.      Pre-operative 5 CHG Bathing Instructions   You can play a key role in reducing the risk of infection after surgery. Your skin needs to be as free of germs as possible. You can reduce the number of germs on your skin by washing with CHG (chlorhexidine gluconate) soap before surgery. CHG is an antiseptic soap that kills germs and continues to kill germs even after washing.   DO NOT use if you have an allergy to chlorhexidine/CHG or antibacterial soaps. If your skin becomes reddened or irritated, stop using the CHG and notify one of our RNs at [phone].   Please shower with the CHG soap starting 4 days before surgery using the following schedule:     Please keep in mind the following:  DO NOT shave, including legs and underarms, starting the day of  your first shower.   You may shave your face at any point before/day of surgery.  Place clean sheets on your bed the day you start using CHG soap. Use a clean washcloth (not used since being washed) for each shower. DO NOT sleep with pets once you start using the CHG.   CHG Shower Instructions:  Wash your face and private area with normal soap. If you choose to wash your hair, wash first with your normal shampoo.  After you use shampoo/soap, rinse your hair and body thoroughly to remove shampoo/soap residue.  Turn the water OFF and apply about 3 tablespoons (45 ml) of CHG soap  to a CLEAN washcloth.  Apply CHG soap ONLY FROM YOUR NECK DOWN TO YOUR TOES (washing for 3-5 minutes)  DO NOT use CHG soap on face, private areas, open wounds, or sores.  Pay special attention to the area where your surgery is being performed.  If you are having back surgery, having someone wash your back for you may be helpful. Wait 2 minutes after CHG soap is applied, then you may rinse off the CHG soap.  Pat dry with a clean towel  Put on clean clothes/pajamas   If you choose to wear lotion, please use ONLY the CHG-compatible lotions on the back of this paper.   Additional instructions for the day of surgery: DO NOT APPLY any lotions, deodorants or cologne  Do not bring valuables to the hospital. [facility] is not responsible for any belongings/valuables. Do not wear jewelry Put on clean/comfortable clothes.  Please brush your teeth.  Ask your nurse before applying any prescription medications to the skin.     CHG Compatible Lotions   Aveeno Moisturizing lotion  Cetaphil Moisturizing Cream  Cetaphil Moisturizing Lotion  Clairol Herbal Essence Moisturizing Lotion, Dry Skin  Clairol Herbal Essence Moisturizing Lotion, Extra Dry Skin  Clairol Herbal Essence Moisturizing Lotion, Normal Skin  Curel Age Defying Therapeutic Moisturizing Lotion with Alpha Hydroxy  Curel Extreme Care Body Lotion  Curel Soothing Hands Moisturizing Hand Lotion  Curel Therapeutic Moisturizing Cream, Fragrance-Free  Curel Therapeutic Moisturizing Lotion, Fragrance-Free  Curel Therapeutic Moisturizing Lotion, Original Formula  Eucerin Daily Replenishing Lotion  Eucerin Dry Skin Therapy Plus Alpha Hydroxy Crme  Eucerin Dry Skin Therapy Plus Alpha Hydroxy Lotion  Eucerin Original Crme  Eucerin Original Lotion  Eucerin Plus Crme Eucerin Plus Lotion  Eucerin TriLipid Replenishing Lotion  Keri Anti-Bacterial Hand Lotion  Keri Deep Conditioning Original Lotion Dry Skin Formula Softly Scented  Keri Deep  Conditioning Original Lotion, Fragrance Free Sensitive Skin Formula  Keri Lotion Fast Absorbing Fragrance Free Sensitive Skin Formula  Keri Lotion Fast Absorbing Softly Scented Dry Skin Formula  Keri Original Lotion  Keri Skin Renewal Lotion Keri Silky Smooth Lotion  Keri Silky Smooth Sensitive Skin Lotion  Nivea Body Creamy Conditioning Oil  Nivea Body Extra Enriched Lotion  Nivea Body Original Lotion  Nivea Body Sheer Moisturizing Lotion Nivea Crme  Nivea Skin Firming Lotion  NutraDerm 30 Skin Lotion  NutraDerm Skin Lotion  NutraDerm Therapeutic Skin Cream  NutraDerm Therapeutic Skin Lotion  ProShield Protective Hand Cream  Provon moisturizing lotion  Please read over the following fact sheets that you were given.

## 2023-04-20 ENCOUNTER — Encounter (HOSPITAL_COMMUNITY)
Admission: RE | Admit: 2023-04-20 | Discharge: 2023-04-20 | Disposition: A | Discharge status: Home / Self Care | Payer: Medicare Other | Source: Ambulatory Visit | Attending: Neurosurgery | Admitting: Neurosurgery

## 2023-04-20 ENCOUNTER — Encounter (HOSPITAL_COMMUNITY): Payer: Self-pay

## 2023-04-20 ENCOUNTER — Other Ambulatory Visit: Payer: Self-pay

## 2023-04-20 DIAGNOSIS — Z01818 Encounter for other preprocedural examination: Secondary | ICD-10-CM

## 2023-04-20 DIAGNOSIS — K219 Gastro-esophageal reflux disease without esophagitis: Secondary | ICD-10-CM | POA: Insufficient documentation

## 2023-04-20 DIAGNOSIS — I700 Atherosclerosis of aorta: Secondary | ICD-10-CM | POA: Insufficient documentation

## 2023-04-20 DIAGNOSIS — J45909 Unspecified asthma, uncomplicated: Secondary | ICD-10-CM | POA: Insufficient documentation

## 2023-04-20 DIAGNOSIS — Z01812 Encounter for preprocedural laboratory examination: Secondary | ICD-10-CM | POA: Insufficient documentation

## 2023-04-20 DIAGNOSIS — E785 Hyperlipidemia, unspecified: Secondary | ICD-10-CM | POA: Insufficient documentation

## 2023-04-20 DIAGNOSIS — R0609 Other forms of dyspnea: Secondary | ICD-10-CM | POA: Insufficient documentation

## 2023-04-20 DIAGNOSIS — Z96653 Presence of artificial knee joint, bilateral: Secondary | ICD-10-CM | POA: Diagnosis not present

## 2023-04-20 DIAGNOSIS — M4317 Spondylolisthesis, lumbosacral region: Secondary | ICD-10-CM | POA: Diagnosis not present

## 2023-04-20 DIAGNOSIS — M47816 Spondylosis without myelopathy or radiculopathy, lumbar region: Secondary | ICD-10-CM | POA: Diagnosis not present

## 2023-04-20 DIAGNOSIS — M5031 Other cervical disc degeneration,  high cervical region: Secondary | ICD-10-CM | POA: Insufficient documentation

## 2023-04-20 DIAGNOSIS — I2510 Atherosclerotic heart disease of native coronary artery without angina pectoris: Secondary | ICD-10-CM | POA: Diagnosis not present

## 2023-04-20 DIAGNOSIS — I10 Essential (primary) hypertension: Secondary | ICD-10-CM | POA: Diagnosis not present

## 2023-04-20 HISTORY — DX: Polyneuropathy, unspecified: G62.9

## 2023-04-20 LAB — CBC
HCT: 46.6 % (ref 39.0–52.0)
Hemoglobin: 15.7 g/dL (ref 13.0–17.0)
MCH: 30.8 pg (ref 26.0–34.0)
MCHC: 33.7 g/dL (ref 30.0–36.0)
MCV: 91.4 fL (ref 80.0–100.0)
Platelets: 193 10*3/uL (ref 150–400)
RBC: 5.1 MIL/uL (ref 4.22–5.81)
RDW: 12.8 % (ref 11.5–15.5)
WBC: 5.9 10*3/uL (ref 4.0–10.5)
nRBC: 0 % (ref 0.0–0.2)

## 2023-04-20 LAB — BASIC METABOLIC PANEL
Anion gap: 5 (ref 5–15)
BUN: 12 mg/dL (ref 8–23)
CO2: 28 mmol/L (ref 22–32)
Calcium: 9.5 mg/dL (ref 8.9–10.3)
Chloride: 105 mmol/L (ref 98–111)
Creatinine, Ser: 1.28 mg/dL — ABNORMAL HIGH (ref 0.61–1.24)
GFR, Estimated: 59 mL/min — ABNORMAL LOW (ref >60)
Glucose, Bld: 106 mg/dL — ABNORMAL HIGH (ref 70–99)
Potassium: 5 mmol/L (ref 3.5–5.1)
Sodium: 138 mmol/L (ref 135–145)

## 2023-04-20 LAB — TYPE AND SCREEN
ABO/RH(D): O POS
Antibody Screen: NEGATIVE

## 2023-04-20 LAB — SURGICAL PCR SCREEN
MRSA, PCR: NEGATIVE
Staphylococcus aureus: NEGATIVE

## 2023-04-21 NOTE — Anesthesia Preprocedure Evaluation (Signed)
Anesthesia Evaluation  Patient identified by MRN, date of birth, ID band Patient awake    Reviewed: Allergy & Precautions, NPO status , Patient's Chart, lab work & pertinent test results, reviewed documented beta blocker date and time   History of Anesthesia Complications (+) PONV and history of anesthetic complications  Airway Mallampati: III  TM Distance: >3 FB     Dental  (+) Edentulous Upper   Pulmonary neg sleep apnea, pneumonia, neg COPD, former smoker, neg PE   breath sounds clear to auscultation       Cardiovascular hypertension, (-) angina (-) CAD, (-) Past MI and (-) Cardiac Stents + Valvular Problems/Murmurs  Rhythm:Regular Rate:Normal     Neuro/Psych neg Seizures  Neuromuscular disease    GI/Hepatic ,GERD  ,,  Endo/Other    Renal/GU Renal disease     Musculoskeletal  (+) Arthritis ,    Abdominal   Peds  Hematology   Anesthesia Other Findings   Reproductive/Obstetrics                             Anesthesia Physical Anesthesia Plan  ASA: 2  Anesthesia Plan: General   Post-op Pain Management: Regional block*   Induction: Intravenous  PONV Risk Score and Plan: 4 or greater and TIVA, Dexamethasone and Ondansetron  Airway Management Planned: Oral ETT  Additional Equipment:   Intra-op Plan:   Post-operative Plan: Extubation in OR  Informed Consent: I have reviewed the patients History and Physical, chart, labs and discussed the procedure including the risks, benefits and alternatives for the proposed anesthesia with the patient or authorized representative who has indicated his/her understanding and acceptance.     Dental advisory given  Plan Discussed with:   Anesthesia Plan Comments: (PAT note written 04/21/2023 by Shonna Chock, PA-C.  )       Anesthesia Quick Evaluation  04/20/2023                HCT                      46.6                04/20/2023                MCV                      91.4                04/20/2023                PLT                      193                 04/20/2023              Anesthesia Other Findings All: sulfa, Morphine, codeine, oxycodone  Reproductive/Obstetrics                              Anesthesia Physical Anesthesia Plan  ASA: 3  Anesthesia Plan: General   Post-op Pain Management: Ketamine IV*   Induction: Intravenous  PONV Risk Score and Plan: Treatment may vary due to age or medical condition and Ondansetron  Airway Management Planned: Video Laryngoscope Planned and Oral ETT  Additional Equipment: Arterial line  Intra-op Plan:   Post-operative Plan: Extubation in OR  Informed Consent: I have reviewed the patients History and Physical, chart, labs and discussed the procedure including the risks, benefits and alternatives for the proposed anesthesia with the patient or authorized representative who has indicated his/her understanding and acceptance.     Dental advisory given  Plan Discussed with: CRNA  Anesthesia Plan Comments: (PAT note written 04/21/2023 by Shonna Chock, PA-C.  )        Anesthesia Quick Evaluation

## 2023-04-21 NOTE — Progress Notes (Signed)
Anesthesia Chart Review:  Case: [id number] Date/Time: 04/26/23 0815   Procedure: POSTERIOR LUMBAR INTERBODY FUSION-L5-S1, WITH INTERBODY PROSTHESIS AND POSTERIOR INSTRUMENTATION FROM L5 TO ILIUM AND EXPLORATION OF FUSION L2-L5 - C3   Anesthesia type: General   Pre-op diagnosis: Spondyloisthesis, lumbar region   [facility] ROOM [facility] / [facility]   Surgeons: Tressie Stalker, MD       DISCUSSION: Patient is a 72 year old male scheduled for the above procedure.  History includes former smoker (quit 06/14/19), asthma, HTN, HLD, CAD (minimal CAD 05/24/2021 CCTA), aortic insufficiency (mild 03/06/23), ascending aorta dilation (40 mm 03/06/23), exertional dyspnea, GERD, neuropathy, spinal surgery (C6-7 ACDF 04/20/07, L4-5 fusion 07/18/07; L3-4 fusion 04/26/18; L2-3 fusion 02/06/20), osteoarthritis (left TKA 07/14/14; right TKA 05/01/20; right THA 12/07/21).  Preoperative cardiology evaluation by Tereso Newcomer, PA-C on 02/21/2023. He wrote, "Mr. Proffitt has a history of chronic chest discomfort for the past couple of years.  This is unchanged.  He had a coronary CTA in December 2022 that demonstrated minimal nonobstructive CAD.  He does have mild to moderate aortic insufficiency noted on echocardiogram in December 2022.  He admits to fatigue and shortness of breath with some activities.  He relates this all to his orthopedic issues.  Overall, his perioperative risk of a major cardiac event is 0.9% according to the Revised Cardiac Risk Index (RCRI).  Therefore, he is at low risk for perioperative complications.   His functional capacity is fair at 4.31 METs according to the Duke Activity Status Index (DASI)." He gave permission to hold ASA and did order a preoperative echo which was done on 03/06/23. He added, " Echocardiogram 03/06/2023: EF 55-60, no RWMA, mild LVH, GR 1 DD, normal RVSF, mild AI, mild dilation of ascending aorta (40 mm).  Aortic insufficiency is mild. No further testing needed. The patient can proceed with his  procedure at acceptable CV risk."  Anesthesia team evaluate on the day of surgery.   VS: BP 138/62   Pulse 62   Temp 36.9 C   Resp 18   Ht 5\' 10"  (1.778 m)   Wt 95.7 kg   SpO2 99%   BMI 30.26 kg/m   PROVIDERS: Assunta Found, MD is PCP Kristeen Miss, MD is cardiologist   LABS: Labs reviewed: Acceptable for surgery. (all labs ordered are listed, but only abnormal results are displayed)  Labs Reviewed  BASIC METABOLIC PANEL - Abnormal; Notable for the following components:      Result Value   Glucose, Bld 106 (*)    Creatinine, Ser 1.28 (*)    GFR, Estimated 59 (*)    All other components within normal limits  SURGICAL PCR SCREEN  CBC  TYPE AND SCREEN     IMAGES: MRI L-spine 10/11/22: IMPRESSION: 1. Status post L2-5 discectomy and fusion. The central canal and foramina are open at the operated levels. L2-3 fusion is new since the prior MRI. 2. No change in mild bilateral foraminal narrowing at L1-2. 3. No change in 1 cm anterolisthesis L5 on S1. No stenosis at L5-S1.  MRI C-spine 10/11/22: IMPRESSION: 1. At C2-3 there is a mild broad-based disc bulge. Mild bilateral facet arthropathy. Moderate-severe right foraminal stenosis. Moderate left foraminal stenosis. 2. At C3-4 there is a broad-based disc bulge. Moderate right and mild left facet arthropathy. Bilateral uncovertebral degenerative changes. Severe bilateral foraminal stenosis. Mild spinal stenosis. 3. At C5-6 there is a broad-based disc osteophyte complex. Bilateral uncovertebral degenerative changes. Moderate-severe right foraminal stenosis. Mild left foraminal stenosis. 4. At  C7-T1 there is a broad-based disc bulge. Moderate left facet arthropathy. Moderate left foraminal stenosis. Mild-moderate right foraminal stenosis. 5. Anterior cervical fusion at C6-7 without foraminal or central canal stenosis.    EKG: 02/21/23: Sinus rhythm with occasional Premature ventricular complexes Right bundle branch  block Left anterior fasicular block No significant change since last tracing Confirmed by Tereso Newcomer [phone]) on 02/21/2023 2:17:04 PM   CV: Echo 03/06/2023: IMPRESSIONS   1. Left ventricular ejection fraction, by estimation, is 55 to 60%. The  left ventricle has normal function. The left ventricle has no regional  wall motion abnormalities. There is mild left ventricular hypertrophy.  Left ventricular diastolic parameters  are consistent with Grade I diastolic dysfunction (impaired relaxation).  Elevated left atrial pressure.   2. Right ventricular systolic function is normal. The right ventricular  size is normal.   3. The mitral valve is normal in structure. No evidence of mitral valve  regurgitation. No evidence of mitral stenosis.   4. The aortic valve is tricuspid. Aortic valve regurgitation is mild. No  aortic stenosis is present.   5. Aortic dilatation noted. There is mild dilatation of the ascending  aorta, measuring 40 mm.    CT Coronary 05/24/2021: IMPRESSION: 1. Minimal non-obstructive mixed CAD, CADRADS = 1. 2. Coronary calcium score of 69. This was 38th percentile for age and sex matched control. 3. Normal coronary origin with right dominance. 4. Borderline dilated ascending aorta at 39 mm. 5. Aortic atherosclerosis and aortic annular calcification.    Nuclear stress 07/22/2019: Nuclear stress EF: 70%. There was no ST segment deviation noted during stress. The study is normal. This is a low risk study. The left ventricular ejection fraction is hyperdynamic (>65%).   Normal resting and stress perfusion. No ischemia or infarction EF 70% Baseline ECG with RBBB  Past Medical History:  Diagnosis Date   Adjacent segment disease with spinal stenosis    Aortic insufficiency 02/21/2023   TTE 05/25/2021: EF 55-60, no RWMA, GLS -17.9, normal RVSF, mild LAE, trivial MR, mild to moderate AI, AV sclerosis, mild dilation of aortic root (38 mm), mild dilation of ascending  aorta (41 mm), RAP 3    Arthritis    "knees; back" (07/14/2014)   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   TTE 05/2021: 41 mm  CT 05/2021: 39 mm    Asthma    CAD (coronary artery disease)    Exertional dyspnea    GERD (gastroesophageal reflux disease)    Hyperlipidemia    Hypertension    Neuropathy    Primary localized osteoarthritis of left knee    Primary localized osteoarthritis of right knee 05/04/2020   Seasonal allergies     Past Surgical History:  Procedure Laterality Date   ANTERIOR CERVICAL DECOMP/DISCECTOMY FUSION  1999   Dr Charna Busman SURGERY     BUNIONECTOMY Right 2013   COLONOSCOPY N/A 11/06/2012   Procedure: COLONOSCOPY;  Surgeon: Dalia Heading, MD;  Location: [facility];  Service: Gastroenterology;  Laterality: N/A;   FOOT SURGERY Right 2013   bunionectomy   KNEE ARTHROSCOPY Bilateral 1990's   POSTERIOR LUMBAR FUSION  1985; 2009   Dr Jeral Fruit   TONSILLECTOMY  1950's   TOTAL HIP ARTHROPLASTY Right 12/07/2021   Procedure: TOTAL HIP ARTHROPLASTY ANTERIOR APPROACH;  Surgeon: Sheral Apley, MD;  Location: [facility];  Service: Orthopedics;  Laterality: Right;   TOTAL KNEE ARTHROPLASTY Left 07/14/2014   Procedure: LEFT TOTAL KNEE ARTHROPLASTY;  Surgeon: Nilda Simmer, MD;  Location: [facility] OR;  Service: Orthopedics;  Laterality: Left;   TOTAL KNEE ARTHROPLASTY Right 05/11/2020   Procedure: TOTAL KNEE ARTHROPLASTY;  Surgeon: Salvatore Marvel, MD;  Location: [facility];  Service: Orthopedics;  Laterality: Right;   WISDOM TOOTH EXTRACTION      MEDICATIONS:  acetaminophen (TYLENOL) 500 MG tablet   aspirin EC 81 MG tablet   calcium carbonate (TUMS - DOSED IN MG ELEMENTAL CALCIUM) 500 MG chewable tablet   cholecalciferol (VITAMIN D3) 25 MCG (1000 UNIT) tablet   gabapentin (NEURONTIN) 600 MG tablet   losartan (COZAAR) 25 MG tablet   ondansetron (ZOFRAN) 8 MG tablet   pantoprazole (PROTONIX) 40 MG tablet   polyethylene glycol (MIRALAX / GLYCOLAX) 17 g packet    sildenafil (VIAGRA) 100 MG tablet   VENTOLIN HFA 108 (90 Base) MCG/ACT inhaler   No current facility-administered medications for this encounter.    Shonna Chock, PA-C Surgical Short Stay/Anesthesiology [facility] Phone [phone] [facility] Phone [phone] 04/21/2023 3:37 PM

## 2023-04-26 ENCOUNTER — Encounter (HOSPITAL_COMMUNITY): Payer: Self-pay | Admitting: Neurosurgery

## 2023-04-26 ENCOUNTER — Inpatient Hospital Stay (HOSPITAL_COMMUNITY): Payer: Medicare Other | Admitting: Vascular Surgery

## 2023-04-26 ENCOUNTER — Other Ambulatory Visit: Payer: Self-pay

## 2023-04-26 ENCOUNTER — Inpatient Hospital Stay (HOSPITAL_COMMUNITY)
Admission: RE | Admit: 2023-04-26 | Discharge: 2023-04-27 | DRG: 402 | Disposition: A | Discharge status: Home / Self Care | Payer: Medicare Other | Attending: Neurosurgery | Admitting: Neurosurgery

## 2023-04-26 ENCOUNTER — Inpatient Hospital Stay (HOSPITAL_COMMUNITY)
Admission: RE | Disposition: A | Discharge status: Home / Self Care | Payer: Self-pay | Source: Home / Self Care | Attending: Neurosurgery

## 2023-04-26 ENCOUNTER — Inpatient Hospital Stay (HOSPITAL_COMMUNITY): Payer: Medicare Other

## 2023-04-26 ENCOUNTER — Inpatient Hospital Stay (HOSPITAL_COMMUNITY): Payer: Medicare Other | Admitting: Anesthesiology

## 2023-04-26 DIAGNOSIS — Z820 Family history of epilepsy and other diseases of the nervous system: Secondary | ICD-10-CM | POA: Diagnosis not present

## 2023-04-26 DIAGNOSIS — M4807 Spinal stenosis, lumbosacral region: Secondary | ICD-10-CM | POA: Diagnosis present

## 2023-04-26 DIAGNOSIS — Z79899 Other long term (current) drug therapy: Secondary | ICD-10-CM | POA: Diagnosis not present

## 2023-04-26 DIAGNOSIS — E785 Hyperlipidemia, unspecified: Secondary | ICD-10-CM | POA: Diagnosis present

## 2023-04-26 DIAGNOSIS — Z823 Family history of stroke: Secondary | ICD-10-CM | POA: Diagnosis not present

## 2023-04-26 DIAGNOSIS — G629 Polyneuropathy, unspecified: Secondary | ICD-10-CM | POA: Diagnosis present

## 2023-04-26 DIAGNOSIS — K219 Gastro-esophageal reflux disease without esophagitis: Secondary | ICD-10-CM | POA: Diagnosis present

## 2023-04-26 DIAGNOSIS — M4317 Spondylolisthesis, lumbosacral region: Secondary | ICD-10-CM | POA: Diagnosis present

## 2023-04-26 DIAGNOSIS — Z7982 Long term (current) use of aspirin: Secondary | ICD-10-CM

## 2023-04-26 DIAGNOSIS — M4316 Spondylolisthesis, lumbar region: Secondary | ICD-10-CM

## 2023-04-26 DIAGNOSIS — Z96653 Presence of artificial knee joint, bilateral: Secondary | ICD-10-CM | POA: Diagnosis present

## 2023-04-26 DIAGNOSIS — I2510 Atherosclerotic heart disease of native coronary artery without angina pectoris: Secondary | ICD-10-CM | POA: Diagnosis present

## 2023-04-26 DIAGNOSIS — J45909 Unspecified asthma, uncomplicated: Secondary | ICD-10-CM | POA: Diagnosis not present

## 2023-04-26 DIAGNOSIS — M4727 Other spondylosis with radiculopathy, lumbosacral region: Secondary | ICD-10-CM | POA: Diagnosis present

## 2023-04-26 DIAGNOSIS — Z8249 Family history of ischemic heart disease and other diseases of the circulatory system: Secondary | ICD-10-CM

## 2023-04-26 DIAGNOSIS — Z96641 Presence of right artificial hip joint: Secondary | ICD-10-CM | POA: Diagnosis present

## 2023-04-26 DIAGNOSIS — Z01818 Encounter for other preprocedural examination: Principal | ICD-10-CM

## 2023-04-26 DIAGNOSIS — I10 Essential (primary) hypertension: Secondary | ICD-10-CM | POA: Diagnosis present

## 2023-04-26 DIAGNOSIS — Z981 Arthrodesis status: Secondary | ICD-10-CM | POA: Diagnosis not present

## 2023-04-26 DIAGNOSIS — M5416 Radiculopathy, lumbar region: Secondary | ICD-10-CM | POA: Diagnosis not present

## 2023-04-26 LAB — GLUCOSE, CAPILLARY: Glucose-Capillary: 141 mg/dL — ABNORMAL HIGH (ref 70–99)

## 2023-04-26 SURGERY — POSTERIOR LUMBAR FUSION 1 LEVEL
Anesthesia: General | Site: Back

## 2023-04-26 MED ORDER — PROPOFOL 10 MG/ML IV BOLUS
INTRAVENOUS | Status: AC
  Filled 2023-04-26: qty 20

## 2023-04-26 MED ORDER — ORAL CARE MOUTH RINSE: 15.0000 mL | Freq: Once | OROMUCOSAL | Status: AC

## 2023-04-26 MED ORDER — CHLORHEXIDINE GLUCONATE CLOTH 2 % EX PADS: 6.0000 | MEDICATED_PAD | Freq: Once | CUTANEOUS | Status: DC

## 2023-04-26 MED ORDER — POLYETHYLENE GLYCOL 3350 17 G PO PACK
17.0000 g | PACK | Freq: Every day | ORAL | Status: DC
  Administered 2023-04-26: 17 g via ORAL
  Filled 2023-04-26: qty 1

## 2023-04-26 MED ORDER — LOSARTAN POTASSIUM 25 MG PO TABS
25.0000 mg | ORAL_TABLET | Freq: Every day | ORAL | Status: DC
  Administered 2023-04-26 – 2023-04-27 (×2): 25 mg via ORAL
  Filled 2023-04-26 (×2): qty 1

## 2023-04-26 MED ORDER — MENTHOL 3 MG MT LOZG: 1.0000 | LOZENGE | OROMUCOSAL | Status: DC | PRN

## 2023-04-26 MED ORDER — ACETAMINOPHEN 325 MG PO TABS: 650.0000 mg | ORAL_TABLET | ORAL | Status: DC | PRN

## 2023-04-26 MED ORDER — BUPIVACAINE-EPINEPHRINE (PF) 0.5% -1:200000 IJ SOLN
INTRAMUSCULAR | Status: DC | PRN
  Administered 2023-04-26: 10 mL

## 2023-04-26 MED ORDER — KETAMINE HCL 50 MG/5ML IJ SOSY
PREFILLED_SYRINGE | INTRAMUSCULAR | Status: AC
  Filled 2023-04-26: qty 5

## 2023-04-26 MED ORDER — LIDOCAINE 2% (20 MG/ML) 5 ML SYRINGE
INTRAMUSCULAR | Status: AC
  Filled 2023-04-26: qty 5

## 2023-04-26 MED ORDER — SODIUM CHLORIDE 0.9% FLUSH
3.0000 mL | Freq: Two times a day (BID) | INTRAVENOUS | Status: DC
  Administered 2023-04-26: 3 mL via INTRAVENOUS

## 2023-04-26 MED ORDER — PROPOFOL 10 MG/ML IV BOLUS
INTRAVENOUS | Status: DC | PRN
  Administered 2023-04-26: 120 mg via INTRAVENOUS

## 2023-04-26 MED ORDER — ACETAMINOPHEN 650 MG RE SUPP: 650.0000 mg | RECTAL | Status: DC | PRN

## 2023-04-26 MED ORDER — AMISULPRIDE (ANTIEMETIC) 5 MG/2ML IV SOLN: 10.0000 mg | Freq: Once | INTRAVENOUS | Status: DC | PRN

## 2023-04-26 MED ORDER — MIDAZOLAM HCL 2 MG/2ML IJ SOLN
INTRAMUSCULAR | Status: AC
Start: 2023-04-26 — End: ?
  Filled 2023-04-26: qty 2

## 2023-04-26 MED ORDER — PHENYLEPHRINE HCL-NACL 20-0.9 MG/250ML-% IV SOLN
INTRAVENOUS | Status: DC | PRN
  Administered 2023-04-26: 20 ug/min via INTRAVENOUS

## 2023-04-26 MED ORDER — DEXAMETHASONE SODIUM PHOSPHATE 10 MG/ML IJ SOLN
INTRAMUSCULAR | Status: AC
  Filled 2023-04-26: qty 1

## 2023-04-26 MED ORDER — DEXAMETHASONE SODIUM PHOSPHATE 10 MG/ML IJ SOLN
INTRAMUSCULAR | Status: DC | PRN
  Administered 2023-04-26: 10 mg via INTRAVENOUS

## 2023-04-26 MED ORDER — DOCUSATE SODIUM 100 MG PO CAPS
100.0000 mg | ORAL_CAPSULE | Freq: Two times a day (BID) | ORAL | Status: DC
  Administered 2023-04-26 – 2023-04-27 (×2): 100 mg via ORAL
  Filled 2023-04-26 (×2): qty 1

## 2023-04-26 MED ORDER — ONDANSETRON HCL 4 MG/2ML IJ SOLN: 4.0000 mg | Freq: Four times a day (QID) | INTRAMUSCULAR | Status: DC | PRN

## 2023-04-26 MED ORDER — BUPIVACAINE LIPOSOME 1.3 % IJ SUSP
INTRAMUSCULAR | Status: AC
  Filled 2023-04-26: qty 20

## 2023-04-26 MED ORDER — SUGAMMADEX SODIUM 200 MG/2ML IV SOLN
INTRAVENOUS | Status: DC | PRN
  Administered 2023-04-26: 400 mg via INTRAVENOUS

## 2023-04-26 MED ORDER — HYDROMORPHONE HCL 1 MG/ML IJ SOLN: 0.5000 mg | INTRAMUSCULAR | Status: DC | PRN

## 2023-04-26 MED ORDER — ROCURONIUM BROMIDE 10 MG/ML (PF) SYRINGE
PREFILLED_SYRINGE | INTRAVENOUS | Status: DC | PRN
  Administered 2023-04-26 (×2): 40 mg via INTRAVENOUS
  Administered 2023-04-26: 60 mg via INTRAVENOUS
  Administered 2023-04-26: 20 mg via INTRAVENOUS

## 2023-04-26 MED ORDER — ACETAMINOPHEN 500 MG PO TABS
1000.0000 mg | ORAL_TABLET | Freq: Four times a day (QID) | ORAL | Status: DC
  Administered 2023-04-26 – 2023-04-27 (×3): 1000 mg via ORAL
  Filled 2023-04-26 (×3): qty 2

## 2023-04-26 MED ORDER — ONDANSETRON HCL 4 MG/2ML IJ SOLN
INTRAMUSCULAR | Status: DC | PRN
  Administered 2023-04-26: 4 mg via INTRAVENOUS

## 2023-04-26 MED ORDER — LIDOCAINE 2% (20 MG/ML) 5 ML SYRINGE
INTRAMUSCULAR | Status: DC | PRN
  Administered 2023-04-26: 100 mg via INTRAVENOUS

## 2023-04-26 MED ORDER — CHLORHEXIDINE GLUCONATE 0.12 % MT SOLN
15.0000 mL | Freq: Once | OROMUCOSAL | Status: AC
  Administered 2023-04-26: 15 mL via OROMUCOSAL
  Filled 2023-04-26: qty 15

## 2023-04-26 MED ORDER — OXYCODONE HCL 5 MG/5ML PO SOLN: 5.0000 mg | Freq: Once | ORAL | Status: DC | PRN

## 2023-04-26 MED ORDER — THROMBIN 5000 UNITS EX SOLR
CUTANEOUS | Status: AC
  Filled 2023-04-26: qty 5000

## 2023-04-26 MED ORDER — HYDROMORPHONE HCL 2 MG PO TABS
2.0000 mg | ORAL_TABLET | ORAL | Status: DC | PRN
  Administered 2023-04-26 – 2023-04-27 (×2): 2 mg via ORAL
  Filled 2023-04-26 (×2): qty 1

## 2023-04-26 MED ORDER — 0.9 % SODIUM CHLORIDE (POUR BTL) OPTIME
TOPICAL | Status: DC | PRN
  Administered 2023-04-26: 1000 mL

## 2023-04-26 MED ORDER — ONDANSETRON HCL 4 MG/2ML IJ SOLN: 4.0000 mg | Freq: Once | INTRAMUSCULAR | Status: DC | PRN

## 2023-04-26 MED ORDER — ONDANSETRON HCL 4 MG PO TABS
4.0000 mg | ORAL_TABLET | Freq: Four times a day (QID) | ORAL | Status: DC | PRN
Start: 2023-04-26 — End: 2023-04-27

## 2023-04-26 MED ORDER — OXYCODONE HCL 5 MG PO TABS: 5.0000 mg | ORAL_TABLET | Freq: Once | ORAL | Status: DC | PRN

## 2023-04-26 MED ORDER — ROCURONIUM BROMIDE 10 MG/ML (PF) SYRINGE
PREFILLED_SYRINGE | INTRAVENOUS | Status: AC
  Filled 2023-04-26: qty 20

## 2023-04-26 MED ORDER — LACTATED RINGERS IV SOLN: INTRAVENOUS | Status: DC | PRN

## 2023-04-26 MED ORDER — KETAMINE HCL 10 MG/ML IJ SOLN
INTRAMUSCULAR | Status: DC | PRN
  Administered 2023-04-26 (×4): 10 mg via INTRAVENOUS

## 2023-04-26 MED ORDER — SODIUM CHLORIDE (PF) 0.9 % IJ SOLN
INTRAMUSCULAR | Status: AC
  Filled 2023-04-26: qty 30

## 2023-04-26 MED ORDER — VASHE WOUND IRRIGATION OPTIME
TOPICAL | Status: DC | PRN
  Administered 2023-04-26: 34 [oz_av] via TOPICAL

## 2023-04-26 MED ORDER — BISACODYL 10 MG RE SUPP: 10.0000 mg | Freq: Every day | RECTAL | Status: DC | PRN

## 2023-04-26 MED ORDER — BUPIVACAINE-EPINEPHRINE (PF) 0.5% -1:200000 IJ SOLN
INTRAMUSCULAR | Status: AC
  Filled 2023-04-26: qty 30

## 2023-04-26 MED ORDER — EPHEDRINE 5 MG/ML INJ
INTRAVENOUS | Status: AC
  Filled 2023-04-26: qty 10

## 2023-04-26 MED ORDER — PHENOL 1.4 % MT LIQD: 1.0000 | OROMUCOSAL | Status: DC | PRN

## 2023-04-26 MED ORDER — CEFAZOLIN SODIUM-DEXTROSE 2-4 GM/100ML-% IV SOLN
2.0000 g | Freq: Three times a day (TID) | INTRAVENOUS | Status: AC
  Administered 2023-04-26 – 2023-04-27 (×2): 2 g via INTRAVENOUS
  Filled 2023-04-26 (×2): qty 100

## 2023-04-26 MED ORDER — EPHEDRINE SULFATE-NACL 50-0.9 MG/10ML-% IV SOSY
PREFILLED_SYRINGE | INTRAVENOUS | Status: DC | PRN
  Administered 2023-04-26 (×3): 10 mg via INTRAVENOUS
  Administered 2023-04-26: 5 mg via INTRAVENOUS
  Administered 2023-04-26: 10 mg via INTRAVENOUS

## 2023-04-26 MED ORDER — PANTOPRAZOLE SODIUM 40 MG PO TBEC
40.0000 mg | DELAYED_RELEASE_TABLET | Freq: Every day | ORAL | Status: DC
  Administered 2023-04-27: 40 mg via ORAL
  Filled 2023-04-26: qty 1

## 2023-04-26 MED ORDER — ALBUTEROL SULFATE (2.5 MG/3ML) 0.083% IN NEBU: 3.0000 mL | INHALATION_SOLUTION | Freq: Four times a day (QID) | RESPIRATORY_TRACT | Status: DC | PRN

## 2023-04-26 MED ORDER — THROMBIN 5000 UNITS EX SOLR: OROMUCOSAL | Status: DC | PRN

## 2023-04-26 MED ORDER — HYDROMORPHONE HCL 1 MG/ML IJ SOLN: 0.2500 mg | INTRAMUSCULAR | Status: DC | PRN

## 2023-04-26 MED ORDER — CEFAZOLIN SODIUM-DEXTROSE 2-4 GM/100ML-% IV SOLN
2.0000 g | INTRAVENOUS | Status: AC
  Administered 2023-04-26 (×2): 2 g via INTRAVENOUS
  Filled 2023-04-26: qty 100

## 2023-04-26 MED ORDER — BUPIVACAINE LIPOSOME 1.3 % IJ SUSP
INTRAMUSCULAR | Status: DC | PRN
  Administered 2023-04-26: 20 mL

## 2023-04-26 MED ORDER — ACETAMINOPHEN 10 MG/ML IV SOLN: 1000.0000 mg | Freq: Once | INTRAVENOUS | Status: DC | PRN

## 2023-04-26 MED ORDER — SODIUM CHLORIDE 0.9 % IV SOLN: 250.0000 mL | INTRAVENOUS | Status: DC

## 2023-04-26 MED ORDER — GABAPENTIN 400 MG PO CAPS
1200.0000 mg | ORAL_CAPSULE | Freq: Every day | ORAL | Status: DC
  Administered 2023-04-26: 1200 mg via ORAL
  Filled 2023-04-26: qty 3

## 2023-04-26 MED ORDER — CYCLOBENZAPRINE HCL 10 MG PO TABS: 10.0000 mg | ORAL_TABLET | Freq: Three times a day (TID) | ORAL | Status: DC | PRN

## 2023-04-26 MED ORDER — HYDROMORPHONE HCL 1 MG/ML IJ SOLN
0.2500 mg | INTRAMUSCULAR | Status: DC | PRN
Start: 2023-04-26 — End: 2023-04-26

## 2023-04-26 MED ORDER — FENTANYL CITRATE (PF) 250 MCG/5ML IJ SOLN
INTRAMUSCULAR | Status: AC
  Filled 2023-04-26: qty 5

## 2023-04-26 MED ORDER — SODIUM CHLORIDE 0.9% FLUSH: 3.0000 mL | INTRAVENOUS | Status: DC | PRN

## 2023-04-26 MED ORDER — CEFAZOLIN SODIUM 1 G IJ SOLR
INTRAMUSCULAR | Status: AC
  Filled 2023-04-26: qty 20

## 2023-04-26 MED ORDER — BACITRACIN ZINC 500 UNIT/GM EX OINT
TOPICAL_OINTMENT | CUTANEOUS | Status: AC
  Filled 2023-04-26: qty 28.35

## 2023-04-26 MED ORDER — GLYCOPYRROLATE 0.2 MG/ML IJ SOLN
INTRAMUSCULAR | Status: DC | PRN
  Administered 2023-04-26: .2 mg via INTRAVENOUS

## 2023-04-26 MED ORDER — ONDANSETRON HCL 4 MG/2ML IJ SOLN
INTRAMUSCULAR | Status: AC
  Filled 2023-04-26: qty 2

## 2023-04-26 MED ORDER — FENTANYL CITRATE (PF) 250 MCG/5ML IJ SOLN
INTRAMUSCULAR | Status: DC | PRN
  Administered 2023-04-26 (×3): 50 ug via INTRAVENOUS
  Administered 2023-04-26: 100 ug via INTRAVENOUS

## 2023-04-26 SURGICAL SUPPLY — 74 items
BAG COUNTER SPONGE SURGICOUNT (BAG) ×1 IMPLANT
BASKET BONE COLLECTION (BASKET) ×1 IMPLANT
BENZOIN TINCTURE PRP APPL 2/3 (GAUZE/BANDAGES/DRESSINGS) ×1 IMPLANT
BLADE CLIPPER SURG (BLADE) IMPLANT
BUR MATCHSTICK NEURO 3.0 LAGG (BURR) ×1 IMPLANT
BUR PRECISION FLUTE 6.0 (BURR) ×1 IMPLANT
CAGE SABLE 10X22 6-12 8D (Cage) IMPLANT
CANISTER SUCT 3000ML PPV (MISCELLANEOUS) ×1 IMPLANT
CLEANSER WND VASHE INSTL 34OZ (WOUND CARE) ×1 IMPLANT
CNTNR URN SCR LID CUP LEK RST (MISCELLANEOUS) ×1 IMPLANT
CONN LAT CLOSED HORIZ 6.35X5.5 (Connector) ×2 IMPLANT
CONN SPINAL CLOSED NEX 25 (Connector) ×2 IMPLANT
CONNECTOR LAT CLSD HRZ6.35X5.5 (Connector) IMPLANT
CONNECTOR SPINAL CLOSED NEX 25 (Connector) IMPLANT
CONT SPEC 4OZ STRL OR WHT (MISCELLANEOUS) ×1
COVER BACK TABLE 60X90IN (DRAPES) ×1 IMPLANT
DRAPE C-ARM 42X72 X-RAY (DRAPES) ×2 IMPLANT
DRAPE HALF SHEET 40X57 (DRAPES) ×1 IMPLANT
DRAPE LAPAROTOMY 100X72X124 (DRAPES) ×1 IMPLANT
DRAPE SURG 17X23 STRL (DRAPES) ×4 IMPLANT
DRSG OPSITE POSTOP 4X6 (GAUZE/BANDAGES/DRESSINGS) ×1 IMPLANT
DRSG OPSITE POSTOP 4X8 (GAUZE/BANDAGES/DRESSINGS) IMPLANT
ELECT BLADE 4.0 EZ CLEAN MEGAD (MISCELLANEOUS) ×1
ELECT REM PT RETURN 9FT ADLT (ELECTROSURGICAL) ×1
ELECTRODE BLDE 4.0 EZ CLN MEGD (MISCELLANEOUS) ×1 IMPLANT
ELECTRODE REM PT RTRN 9FT ADLT (ELECTROSURGICAL) ×1 IMPLANT
EVACUATOR 1/8 PVC DRAIN (DRAIN) IMPLANT
GAUZE 4X4 16PLY ~~LOC~~+RFID DBL (SPONGE) ×1 IMPLANT
GLOVE BIO SURGEON STRL SZ 6 (GLOVE) ×1 IMPLANT
GLOVE BIO SURGEON STRL SZ8 (GLOVE) ×2 IMPLANT
GLOVE BIO SURGEON STRL SZ8.5 (GLOVE) ×2 IMPLANT
GLOVE BIOGEL PI IND STRL 6.5 (GLOVE) ×1 IMPLANT
GLOVE EXAM NITRILE XL STR (GLOVE) IMPLANT
GOWN STRL REUS W/ TWL LRG LVL3 (GOWN DISPOSABLE) ×1 IMPLANT
GOWN STRL REUS W/ TWL XL LVL3 (GOWN DISPOSABLE) ×2 IMPLANT
GOWN STRL REUS W/TWL 2XL LVL3 (GOWN DISPOSABLE) IMPLANT
GOWN STRL REUS W/TWL LRG LVL3 (GOWN DISPOSABLE) ×1
GOWN STRL REUS W/TWL XL LVL3 (GOWN DISPOSABLE) ×2
HEMOSTAT POWDER KIT SURGIFOAM (HEMOSTASIS) ×1 IMPLANT
KIT BASIN OR (CUSTOM PROCEDURE TRAY) ×1 IMPLANT
KIT GRAFTMAG DEL NEURO DISP (NEUROSURGERY SUPPLIES) IMPLANT
KIT POSITION SURG JACKSON T1 (MISCELLANEOUS) ×1 IMPLANT
KIT TURNOVER KIT B (KITS) ×1 IMPLANT
NDL HYPO 21X1.5 SAFETY (NEEDLE) IMPLANT
NDL HYPO 22X1.5 SAFETY MO (MISCELLANEOUS) ×1 IMPLANT
NEEDLE HYPO 21X1.5 SAFETY (NEEDLE) IMPLANT
NEEDLE HYPO 22X1.5 SAFETY MO (MISCELLANEOUS) ×1 IMPLANT
NS IRRIG 1000ML POUR BTL (IV SOLUTION) ×1 IMPLANT
PACK LAMINECTOMY NEURO (CUSTOM PROCEDURE TRAY) ×1 IMPLANT
PAD ARMBOARD 7.5X6 YLW CONV (MISCELLANEOUS) ×3 IMPLANT
PATTIES SURGICAL .5 X1 (DISPOSABLE) IMPLANT
PUTTY DBM 10CC CALC GRAN (Putty) IMPLANT
ROD CVD SOLERA 5.5X90 (Rod) IMPLANT
ROD SOLERA 80MM (Rod) ×1 IMPLANT
ROD SOLERA 80X5.5XNS TI (Rod) IMPLANT
SCREW BS CNCMA S 8.5X80 T/C (Screw) IMPLANT
SCREW CONNECTOR SET (Screw) IMPLANT
SCREW SET SOLERA (Screw) ×4 IMPLANT
SCREW SET SOLERA TI5.5 (Screw) IMPLANT
SCREW SOLERA 7.5X45 (Screw) IMPLANT
SPIKE FLUID TRANSFER (MISCELLANEOUS) ×1 IMPLANT
SPONGE NEURO XRAY DETECT 1X3 (DISPOSABLE) IMPLANT
SPONGE SURGIFOAM ABS GEL 100 (HEMOSTASIS) IMPLANT
SPONGE T-LAP 4X18 ~~LOC~~+RFID (SPONGE) IMPLANT
STRIP CLOSURE SKIN 1/2X4 (GAUZE/BANDAGES/DRESSINGS) ×1 IMPLANT
SUT VIC AB 1 CT1 18XBRD ANBCTR (SUTURE) ×2 IMPLANT
SUT VIC AB 1 CT1 18XCR BRD 8 (SUTURE) IMPLANT
SUT VIC AB 1 CT1 8-18 (SUTURE) ×3
SUT VIC AB 2-0 CP2 18 (SUTURE) ×2 IMPLANT
SYR 20ML LL LF (SYRINGE) IMPLANT
TOWEL GREEN STERILE (TOWEL DISPOSABLE) ×1 IMPLANT
TOWEL GREEN STERILE FF (TOWEL DISPOSABLE) ×1 IMPLANT
TRAY FOLEY MTR SLVR 16FR STAT (SET/KITS/TRAYS/PACK) ×1 IMPLANT
WATER STERILE IRR 1000ML POUR (IV SOLUTION) ×1 IMPLANT

## 2023-04-26 NOTE — Op Note (Signed)
Brief history: The patient is a 72 year old white male whose had multiple previous back surgeries/fusions.  He has developed persistent left buttock and leg pain consistent with a lumbosacral radiculopathy.  He failed medical management.  He was worked up for lumbar x-rays and lumbar MRI which demonstrated L5-S1 spondylolisthesis and foraminal stenosis.  I discussed the various treatment options with him.  He has decided proceed with surgery.  Preoperative diagnosis: Lumbosacral spondylolisthesis, lumbosacral foraminal stenosis compressing both the L5 and the S1 nerve roots; lumbago; lumbar radiculopathy; neurogenic claudication  Postoperative diagnosis: The same  Procedure: Bilateral L5-S1 laminotomy/foraminotomies/medial facetectomy to decompress the bilateral L5 and S1 nerve roots(the work required to do this was in addition to the work required to do the posterior lumbar interbody fusion because of the patient's spinal stenosis, facet arthropathy. Etc. requiring a wide decompression of the nerve roots.); left L5-S1 transforaminal lumbar interbody fusion with local morselized autograft bone and Zimmer DBM; insertion of interbody prosthesis at L5-S1 (globus peek expandable interbody prosthesis); posterior segmental instrumentation from L2 to ilium with Medtronic titanium pedicle screws and rods; posterior lateral arthrodesis at 5 S1 with local morselized autograft bone and Zimmer DBM; placement of bilateral S2 AI ilium screws; exploration of lumbar fusion.  Surgeon: Dr. Delma Officer  Asst.: None  Anesthesia: Gen. endotracheal  Estimated blood loss: 300 cc  Drains: None  Complications: None  Description of procedure: The patient was brought to the operating room by the anesthesia team. General endotracheal anesthesia was induced. The patient was turned to the prone position on the Wilson frame. The patient's lumbosacral region was then prepared with Betadine scrub and Betadine solution. Sterile  drapes were applied.  I then injected the area to be incised with Marcaine with epinephrine solution. I then used the scalpel to make a linear midline incision over the L3-4, L4-5 and L5-S1 interspace. I then used electrocautery to perform a bilateral subperiosteal dissection exposing the spinous process and lamina of 3 4, L4-5 and L5-S1, and exposing the old hardware at L3-4 and L4-5.  We then inserted the Verstrac retractor to provide exposure.  I inspected the arthrodesis at L2-3, L3-4 and L4-5.  It appeared solid.  I began the decompression by using the high speed drill to perform laminotomies at L5-S1 bilaterally. We then used the Kerrison punches to widen the laminotomy and removed the ligamentum flavum at L5-S1 bilaterally. We used the Kerrison punches to remove the medial facets at L5-S1 bilaterally, I removed the left L5-S1 facet. We performed wide foraminotomies about the bilateral L5 and S1 nerve roots completing the decompression.  We now turned our attention to the posterior lumbar interbody fusion. I used a scalpel to incise the intervertebral disc at L5-S1 bilaterally. I then performed a partial intervertebral discectomy at L5-S1 bilaterally using the pituitary forceps. We prepared the vertebral endplates at L5-S1 bilaterally for the fusion by removing the soft tissues with the curettes. We then used the trial spacers to pick the appropriate sized interbody prosthesis. We prefilled his prosthesis with a combination of local morselized autograft bone that we obtained during the decompression as well as Zimmer DBM. We inserted the prefilled prosthesis into the interspace at L5-S1 from the left, we then turned and expanded the prosthesis. There was a good snug fit of the prosthesis in the interspace. We then filled and the remainder of the intervertebral disc space with local morselized autograft bone and Zimmer DBM. This completed the posterior lumbar interbody arthrodesis.    We now turned  attention to the instrumentation. Under fluoroscopic guidance we cannulated the bilateral S2 AI ilium and bilateral S1 pedicles with the bone probe. We then removed the bone probe. We then tapped the pedicle with a 6.5 mm at S1 bilaterally and 7.5 mm at S2 AI bilaterally millimeter tap. We then removed the tap. We probed inside the tapped pedicle with a ball probe to rule out cortical breaches. We then inserted a 7.5 45 millimeter pedicle screw into the S1 pedicles bilaterally and 8.5 x 80 mm S2 AI screws under fluoroscopic guidance. We then palpated along the medial aspect of the pedicles to rule out cortical breaches. There were none. The nerve roots were not injured. We then connected the then new screws at S1 and S2 AI to the old rod using connectors laterally. This completed the instrumentation from L2 to the ilium bilaterally.  We now turned our attention to the posterior lateral arthrodesis at L5-S1. We used the high-speed drill to decorticate the remainder of the facets, pars, transverse process at L5-S1. We then applied a combination of local morselized autograft bone and Zimmer DBM over these decorticated posterior lateral structures. This completed the posterior lateral arthrodesis.  We then obtained hemostasis using bipolar electrocautery. We irrigated the wound out with vashe solution. We inspected the thecal sac and nerve roots and noted they were well decompressed. We then removed the retractor.  We injected Exparel . We reapproximated patient's thoracolumbar fascia with interrupted #1 Vicryl suture. We reapproximated patient's subcutaneous tissue with interrupted 2-0 Vicryl suture. The reapproximated patient's skin with Steri-Strips and benzoin. The wound was then coated with bacitracin ointment. A sterile dressing was applied. The drapes were removed. The patient was subsequently returned to the supine position where they were extubated by the anesthesia team. He was then transported to the post  anesthesia care unit in stable condition. All sponge instrument and needle counts were reportedly correct at the end of this case.

## 2023-04-26 NOTE — Anesthesia Procedure Notes (Signed)
Arterial Line Insertion Start/End11/13/2024 8:10 AM, 04/26/2023 8:15 AM Performed by: Garfield Cornea, CRNA, CRNA  Preanesthetic checklist: patient identified, IV checked, site marked and risks and benefits discussed Lidocaine 1% used for infiltration Left, radial was placed Catheter size: 20 G Hand hygiene performed  and maximum sterile barriers used   Attempts: 1 Procedure performed without using ultrasound guided technique. Following insertion, dressing applied and Biopatch. Post procedure assessment: normal

## 2023-04-26 NOTE — Anesthesia Procedure Notes (Signed)
Procedure Name: Intubation Date/Time: 04/26/2023 8:58 AM  Performed by: Orlin Hilding, CRNAPre-anesthesia Checklist: Patient identified, Emergency Drugs available, Suction available, Patient being monitored and Timeout performed Patient Re-evaluated:Patient Re-evaluated prior to induction Oxygen Delivery Method: Circle system utilized Preoxygenation: Pre-oxygenation with 100% oxygen Induction Type: IV induction Ventilation: Mask ventilation without difficulty and Oral airway inserted - appropriate to patient size Laryngoscope Size: Glidescope and 3 Grade View: Grade I Tube type: Oral Tube size: 7.5 mm Number of attempts: 1 Placement Confirmation: ETT inserted through vocal cords under direct vision, positive ETCO2 and breath sounds checked- equal and bilateral Secured at: 23 cm Tube secured with: Tape Dental Injury: Teeth and Oropharynx as per pre-operative assessment

## 2023-04-26 NOTE — Transfer of Care (Signed)
Immediate Anesthesia Transfer of Care Note  Patient: Patrick Mathews  Procedure(s) Performed: POSTERIOR LUMBAR INTERBODY FUSION-LUMBAR FIVE-SACRAL ONE, WITH INTERBODY PROSTHESIS AND POSTERIOR INSTRUMENTATION FROM LUMBAR FIVE TO ILIUM AND EXPLORATION OF FUSION LUMBAR TWO-LUMBAR FIVE (Back)  Patient Location: PACU  Anesthesia Type:General  Level of Consciousness: drowsy and patient cooperative  Airway & Oxygen Therapy: Patient Spontanous Breathing and Patient connected to face mask oxygen  Post-op Assessment: Report given to RN and Post -op Vital signs reviewed and stable  Post vital signs: Reviewed and stable  Last Vitals:  Vitals Value Taken Time  BP 127/68 04/26/23 1530  Temp    Pulse 67 04/26/23 1531  Resp 15 04/26/23 1531  SpO2 99 % 04/26/23 1531  Vitals shown include unfiled device data.  Last Pain:  Vitals:   04/26/23 0701  TempSrc:   PainSc: 0-No pain      Patients Stated Pain Goal: 0 (04/26/23 0701)  Complications: No notable events documented.

## 2023-04-26 NOTE — Progress Notes (Signed)
Orthopedic Tech Progress Note Patient Details:  Patrick Mathews Jan 04, 1951 [id number] LSO was delivered to bedside. Patient was given instructions on how to apply the brace Ortho Devices Type of Ortho Device: Lumbar corsett Ortho Device/Splint Interventions: Ordered, Adjustment   Post Interventions Instructions Provided: Adjustment of device, Poper ambulation with device  Brailyn Killion E Khan Chura 04/26/2023, 6:45 PM

## 2023-04-26 NOTE — H&P (Signed)
Subjective: The patient is a 72 year old white male whose had multiple prior back surgeries.  He has developed back and left greater right leg pain consistent with lumbosacral radiculopathy/neurogenic claudication.  He has failed medical management.  He was worked up with lumbar x-rays and an MRI which demonstrated an L5-S1 spondylolisthesis with foraminal stenosis.  I discussed the various treatment options with him.  He has decided proceed with surgery.  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   TTE 05/2021: 41 mm  CT 05/2021: 39 mm    Asthma    CAD (coronary artery disease)    Exertional dyspnea    GERD (gastroesophageal reflux disease)    Hyperlipidemia    Hypertension    Neuropathy    Primary localized osteoarthritis of left knee    Primary localized osteoarthritis of right knee 05/04/2020   Seasonal allergies     Past Surgical History:  Procedure Laterality Date   ANTERIOR CERVICAL DECOMP/DISCECTOMY FUSION  1999   Dr Charna Busman SURGERY     BUNIONECTOMY Right 2013   COLONOSCOPY N/A 11/06/2012   Procedure: COLONOSCOPY;  Surgeon: Dalia Heading, MD;  Location: [facility];  Service: Gastroenterology;  Laterality: N/A;   FOOT SURGERY Right 2013   bunionectomy   KNEE ARTHROSCOPY Bilateral 1990's   POSTERIOR LUMBAR FUSION  1985; 2009   Dr Jeral Fruit   TONSILLECTOMY  1950's   TOTAL HIP ARTHROPLASTY Right 12/07/2021   Procedure: TOTAL HIP ARTHROPLASTY ANTERIOR APPROACH;  Surgeon: Sheral Apley, MD;  Location: [facility];  Service: Orthopedics;  Laterality: Right;   TOTAL KNEE ARTHROPLASTY Left 07/14/2014    Procedure: LEFT TOTAL KNEE ARTHROPLASTY;  Surgeon: Nilda Simmer, MD;  Location: [facility];  Service: Orthopedics;  Laterality: Left;   TOTAL KNEE ARTHROPLASTY Right 05/11/2020   Procedure: TOTAL KNEE ARTHROPLASTY;  Surgeon: Salvatore Marvel, MD;  Location: [facility];  Service: Orthopedics;  Laterality: Right;   WISDOM TOOTH EXTRACTION      Allergies  Allergen Reactions   Sulfa Antibiotics Hives and Rash   Morphine And Codeine Nausea And Vomiting   Oxycodone Hcl Other (See Comments)    Pt feels loopy    Social History   Tobacco Use   Smoking status: Former    Types: Cigars    Quit date: 2021    Years since quitting: 3.8   Smokeless tobacco: Never   Tobacco comments:    07/14/2014 smoked cigs on and off x 40 yrs- none since 2014, but does smoke occ cigar-  Substance Use Topics   Alcohol use: Yes    Alcohol/week: 0.0 standard drinks of alcohol    Comment: occ    Family History  Problem Relation Age of Onset   CAD Mother    CVA Mother    Alzheimer's disease Mother    Dementia Father    Alzheimer's disease Father    Stroke Maternal Grandmother    Heart attack Maternal Grandfather    CAD Maternal Grandfather    Stroke Paternal Grandmother    Colon cancer Neg Hx    Colon polyps Neg Hx  Prior to Admission medications   Medication Sig Start Date End Date Taking? Authorizing Provider  acetaminophen (TYLENOL) 500 MG tablet Take 2 tablets (1,000 mg total) by mouth every 6 (six) hours as needed for mild pain or moderate pain. 12/08/21  Yes Gawne, Meghan M, PA-C  calcium carbonate (TUMS - DOSED IN MG ELEMENTAL CALCIUM) 500 MG chewable tablet Chew 1-2 tablets by mouth daily as needed for indigestion or heartburn.   Yes [provider]  cholecalciferol (VITAMIN D3) 25 MCG (1000 UNIT) tablet Take 1,000 Units by mouth 3 (three) times a week.   Yes [provider]  gabapentin (NEURONTIN) 600 MG tablet Take 1,200 mg by mouth at bedtime.   Yes [provider]  losartan  (COZAAR) 25 MG tablet Take 1 tablet (25 mg total) by mouth daily. Patient taking differently: Take 25 mg by mouth every evening. 01/11/23  Yes Nahser, Deloris Ping, MD  pantoprazole (PROTONIX) 40 MG tablet Take 40 mg by mouth daily.   Yes [provider]  polyethylene glycol (MIRALAX / GLYCOLAX) 17 g packet Take 17 g by mouth daily.   Yes [provider]  aspirin EC 81 MG tablet Take 81 mg by mouth daily. Start after you have your surgery    [provider]  ondansetron (ZOFRAN) 8 MG tablet Take 8 mg by mouth every 8 (eight) hours as needed for nausea or vomiting.    [provider]  sildenafil (VIAGRA) 100 MG tablet Take 100 mg by mouth daily as needed for erectile dysfunction.  04/21/20   [provider]  VENTOLIN HFA 108 (90 Base) MCG/ACT inhaler Inhale 1-2 puffs into the lungs every 6 (six) hours as needed for wheezing or shortness of breath. 03/07/22   [provider]     Review of Systems  Positive ROS: As above  All other systems have been reviewed and were otherwise negative with the exception of those mentioned in the HPI and as above.  Objective: Vital signs in last 24 hours: Temp:  [97.7 F (36.5 C)] 97.7 F (36.5 C) (11/13 0650) Pulse Rate:  [58] 58 (11/13 0650) Resp:  [18] 18 (11/13 0650) BP: (130)/(70) 130/70 (11/13 0650) SpO2:  [98 %] 98 % (11/13 0650) Weight:  [96.2 kg] 96.2 kg (11/13 0650) Estimated body mass index is 30.42 kg/m as calculated from the following:   Height as of this encounter: 5\' 10"  (1.778 m).   Weight as of this encounter: 96.2 kg.   General Appearance: Alert Head: Normocephalic, without obvious abnormality, atraumatic Eyes: PERRL, conjunctiva/corneas clear, EOM's intact,    Ears: Normal  Throat: Normal  Neck: Supple, Back: His lumbar incision is well-healed. Lungs: Clear to auscultation bilaterally, respirations unlabored Heart: Regular rate and rhythm, no murmur, rub or gallop Abdomen: Soft,  non-tender Extremities: Extremities normal, atraumatic, no cyanosis or edema Skin: unremarkable  NEUROLOGIC:   Mental status: alert and oriented,Motor Exam - grossly normal Sensory Exam - grossly normal Reflexes:  Coordination - grossly normal Gait - grossly normal Balance - grossly normal Cranial Nerves: I: smell Not tested  II: visual acuity  OS: Normal  OD: Normal   II: visual fields Full to confrontation  II: pupils Equal, round, reactive to light  III,VII: ptosis None  III,IV,VI: extraocular muscles  Full ROM  V: mastication Normal  V: facial light touch sensation  Normal  V,VII: corneal reflex  Present  VII: facial muscle function - upper  Normal  VII: facial muscle function - lower Normal  VIII: hearing Not tested  IX: soft palate elevation  Normal  IX,X: gag reflex Present  XI: trapezius strength  5/5  XI: sternocleidomastoid strength 5/5  XI: neck flexion strength  5/5  XII: tongue strength  Normal    Data Review Lab Results  Component Value Date   WBC 5.9 04/20/2023   HGB 15.7 04/20/2023   HCT 46.6 04/20/2023   MCV 91.4 04/20/2023   PLT 193 04/20/2023   Lab Results  Component Value Date   NA 138 04/20/2023   K 5.0 04/20/2023   CL 105 04/20/2023   CO2 28 04/20/2023   BUN 12 04/20/2023   CREATININE 1.28 (H) 04/20/2023   GLUCOSE 106 (H) 04/20/2023   Lab Results  Component Value Date   INR 1.0 04/30/2020    Assessment/Plan: Lumbosacral spondylolisthesis, lumbosacral foraminal stenosis, lumbosacral radiculopathy, neurogenic claudication, lumbago: I discussed the situation with the patient.  I have reviewed his imaging studies with him and pointed out the abnormalities.  We have discussed the various treatment options including surgery.  I have described the surgical treatment option of exploration of his lumbar fusion with extension of his instrumentation and fusion to L5-S1 and the ilium.  I have shown him surgical models.  We have discussed the risk,  benefits, alternatives, expected postop course, and likelihood of achieving hours with surgery.  I have answered all his questions.  He has decided proceed with surgery.   Cristi Loron 04/26/2023 8:29 AM

## 2023-04-27 MED ORDER — CYCLOBENZAPRINE HCL 10 MG PO TABS: 10.0000 mg | ORAL_TABLET | Freq: Three times a day (TID) | ORAL | 0 refills | Status: AC | PRN

## 2023-04-27 MED ORDER — DOCUSATE SODIUM 100 MG PO CAPS: 100.0000 mg | ORAL_CAPSULE | Freq: Two times a day (BID) | ORAL | 0 refills | Status: AC

## 2023-04-27 MED ORDER — HYDROMORPHONE HCL 2 MG PO TABS: 2.0000 mg | ORAL_TABLET | ORAL | 0 refills | Status: DC | PRN

## 2023-04-27 NOTE — Discharge Summary (Signed)
Physician Discharge Summary  Patient ID: Patrick Mathews MRN: [id number] DOB/AGE: Oct 12, 1950 72 y.o.  Admit date: 04/26/2023 Discharge date: 04/27/2023  Admission Diagnoses:  lumbosacralspondylolisthesis, lumbosacral radiculopathy, lumbosacral foraminal stenosis, lumbago, lumbar radiculopathy, neurogenic claudication  Discharge Diagnoses:  the same Principal Problem:   Spondylolisthesis of lumbosacral region   Discharged Condition: good  Hospital Course:  I performed an L5-S1 decompression, instrumentation and fusion on the patient [address].  The surgery went well.    The patient's postoperative course was unremarkable.  On postoperative day 1. He requested discharge to home.    The patient and his wife were given verbal and written discharge instructions.  All his questions were answered.  Consults: PT, care management Significant Diagnostic Studies: none Treatments: exploration of lumbar fusion; L5-S1 decompression, instrumentation fusion to the ilium Discharge Exam: Blood pressure 121/75, pulse 65, temperature 97.6 F (36.4 C), resp. rate 18, height 5\' 10"  (1.778 m), weight 96.2 kg, SpO2 100%.  The patient is alert and pleasant.  He looks well.  His strength is normal.  Disposition:   Home  Discharge Instructions     Call MD for:  difficulty breathing, headache or visual disturbances   Complete by: As directed    Call MD for:  extreme fatigue   Complete by: As directed    Call MD for:  hives   Complete by: As directed    Call MD for:  persistant dizziness or light-headedness   Complete by: As directed    Call MD for:  persistant nausea and vomiting   Complete by: As directed    Call MD for:  redness, tenderness, or signs of infection (pain, swelling, redness, odor or green/yellow discharge around incision site)   Complete by: As directed    Call MD for:  severe uncontrolled pain   Complete by: As directed    Call MD for:  temperature >100.4   Complete by: As  directed    Diet - low sodium heart healthy   Complete by: As directed    Discharge instructions   Complete by: As directed    Call [phone] for a followup appointment. Take a stool softener while you are using pain medications.   Driving Restrictions   Complete by: As directed    Do not drive for 2 weeks.   Increase activity slowly   Complete by: As directed    Lifting restrictions   Complete by: As directed    Do not lift more than 5 pounds. No excessive bending or twisting.   May shower / Bathe   Complete by: As directed    Remove the dressing for 3 days after surgery.  You may shower, but leave the incision alone.   Remove dressing in 48 hours   Complete by: As directed       Allergies as of 04/27/2023       Reactions   Sulfa Antibiotics Hives, Rash   Morphine And Codeine Nausea And Vomiting   Oxycodone Hcl Other (See Comments)   Pt feels loopy        Medication List     TAKE these medications    acetaminophen 500 MG tablet Commonly known as: TYLENOL Take 2 tablets (1,000 mg total) by mouth every 6 (six) hours as needed for mild pain or moderate pain.   aspirin EC 81 MG tablet Take 81 mg by mouth daily. Start after you have your surgery   calcium carbonate 500 MG chewable tablet Commonly known as: TUMS -  dosed in mg elemental calcium Chew 1-2 tablets by mouth daily as needed for indigestion or heartburn.   cholecalciferol 25 MCG (1000 UNIT) tablet Commonly known as: VITAMIN D3 Take 1,000 Units by mouth 3 (three) times a week.   cyclobenzaprine 10 MG tablet Commonly known as: FLEXERIL Take 1 tablet (10 mg total) by mouth 3 (three) times daily as needed for muscle spasms.   docusate sodium 100 MG capsule Commonly known as: COLACE Take 1 capsule (100 mg total) by mouth 2 (two) times daily.   gabapentin 600 MG tablet Commonly known as: NEURONTIN Take 1,200 mg by mouth at bedtime.   HYDROmorphone 2 MG tablet Commonly known as: DILAUDID Take 1-2  tablets (2-4 mg total) by mouth every 4 (four) hours as needed for moderate pain (pain score 4-6).   losartan 25 MG tablet Commonly known as: COZAAR Take 1 tablet (25 mg total) by mouth daily. What changed: when to take this   ondansetron 8 MG tablet Commonly known as: ZOFRAN Take 8 mg by mouth every 8 (eight) hours as needed for nausea or vomiting.   pantoprazole 40 MG tablet Commonly known as: PROTONIX Take 40 mg by mouth daily.   polyethylene glycol 17 g packet Commonly known as: MIRALAX / GLYCOLAX Take 17 g by mouth daily.   sildenafil 100 MG tablet Commonly known as: VIAGRA Take 100 mg by mouth daily as needed for erectile dysfunction.   Ventolin HFA 108 (90 Base) MCG/ACT inhaler Generic drug: albuterol Inhale 1-2 puffs into the lungs every 6 (six) hours as needed for wheezing or shortness of breath.         Signed: Cristi Loron 04/27/2023, 1:42 PM

## 2023-04-27 NOTE — Evaluation (Signed)
Physical Therapy Evaluation Patient Details Name: Patrick Mathews MRN: [id number] DOB: 29-Jun-1950 Today's Date: 04/27/2023  History of Present Illness  The pt is a 72 yo male presenting 11/13 for PLIF L5-S1. PMH includes: aortic insufficiency, arthritis, asthma, CAD, HLD, HTN, neuropathy, bilateral TKA, R THA, and multiple spine surgeries.   Clinical Impression  Pt in bed upon arrival of PT, agreeable to evaluation at this time. Prior to admission the pt was independent with all mobility, playing golf, doing yard work, but limited by pain and weakness in LLE. The pt presents today with significant weakness in LLE, most limited in ankle DF and ankle eversion which impacts his ability to ambulate safely. With continued cues he is able to clear L toes, but does need increased reminders with continued mobility. The pt was able to complete sit-stand transfers and hallway ambulation without DME or LOB, but will continue to benefit from skilled PT to progress functional strength and ROM in LLE. Pt and his wife given handout for exercises for L ankle ROM and strengthening exercises, all questions answered. Additionally, pt educated on spinal precautions, progressive walking program, and ADLs while maintaining spinal precautions, pt verbalized understanding. The pt is safe to return home with family assist, will benefit from continued skilled PT to address deficits in LLE to improve independence, stability, and safety with return to full activities.      If plan is discharge home, recommend the following: A little help with walking and/or transfers;Assistance with cooking/housework;Assist for transportation;Help with stairs or ramp for entrance   Can travel by private vehicle        Equipment Recommendations None recommended by PT  Recommendations for Other Services       Functional Status Assessment Patient has had a recent decline in their functional status and demonstrates the ability to make  significant improvements in function in a reasonable and predictable amount of time.     Precautions / Restrictions Precautions Precautions: Back Precaution Booklet Issued: Yes (comment) Required Braces or Orthoses: Spinal Brace Spinal Brace: Lumbar corset;Applied in sitting position Restrictions Weight Bearing Restrictions: No      Mobility  Bed Mobility Overal bed mobility: Needs Assistance Bed Mobility: Rolling, Sidelying to Sit, Sit to Sidelying Rolling: Supervision Sidelying to sit: Supervision     Sit to sidelying: Supervision General bed mobility comments: pt needing cues for log roll, no assist    Transfers Overall transfer level: Needs assistance Equipment used: None Transfers: Sit to/from Stand Sit to Stand: Supervision                Ambulation/Gait Ambulation/Gait assistance: Supervision Gait Distance (Feet): 300 Feet Assistive device: None Gait Pattern/deviations: Step-through pattern, Decreased dorsiflexion - left Gait velocity: decreased Gait velocity interpretation: <1.31 ft/sec, indicative of household ambulator   General Gait Details: decreased clearance with LLE, poor DF, increased toe drag with fatigue. pt responds well to cues but unable to maintain with distraction     Balance Overall balance assessment: Mild deficits observed, not formally tested                                           Pertinent Vitals/Pain Pain Assessment Pain Assessment: Faces Faces Pain Scale: Hurts a little bit Pain Location: incision Pain Descriptors / Indicators: Grimacing, Discomfort Pain Intervention(s): Limited activity within patient's tolerance, Monitored during session, Repositioned    Home Living Family/patient expects  to be discharged to:: Private residence Living Arrangements: Spouse/significant other Available Help at Discharge: Family Type of Home: House Home Access: Ramped entrance     Alternate Level Stairs-Number of  Steps: flight Home Layout: Two level;Able to live on main level with bedroom/bathroom Home Equipment: Rolling Walker (2 wheels);Rollator (4 wheels);Cane - single point;Shower seat;Grab bars - tub/shower      Prior Function Prior Level of Function : Independent/Modified Independent             Mobility Comments: no DME, no falls, playing golf and doing yardwork ADLs Comments: independent     Extremity/Trunk Assessment   Upper Extremity Assessment Upper Extremity Assessment: Overall WFL for tasks assessed    Lower Extremity Assessment Lower Extremity Assessment: LLE deficits/detail LLE Deficits / Details: grossly 3/5 to MMTat ankle, DF and eversion most limited, inversion also limited. pt reports neuropathy below knees LLE Sensation: history of peripheral neuropathy LLE Coordination: decreased fine motor    Cervical / Trunk Assessment Cervical / Trunk Assessment: Back Surgery  Communication   Communication Communication: No apparent difficulties Cueing Techniques: Verbal cues  Cognition Arousal: Alert Behavior During Therapy: WFL for tasks assessed/performed Overall Cognitive Status: Within Functional Limits for tasks assessed                                          General Comments General comments (skin integrity, edema, etc.): VSS, all education covered with pt and spouse, HEP provided for L ankle/leg    Exercises General Exercises - Lower Extremity Ankle Circles/Pumps: AROM, Left, 10 reps Hip Flexion/Marching: AROM, Left, 10 reps Toe Raises: AROM, Left, 20 reps Heel Raises: AROM, Left, 10 reps   Assessment/Plan    PT Assessment Patient needs continued PT services  PT Problem List Decreased activity tolerance;Decreased strength;Decreased balance;Decreased mobility;Impaired sensation       PT Treatment Interventions DME instruction;Gait training;Stair training;Functional mobility training;Therapeutic activities;Therapeutic exercise;Balance  training    PT Goals (Current goals can be found in the Care Plan section)  Acute Rehab PT Goals Patient Stated Goal: return home and to playing golf PT Goal Formulation: With patient/family Time For Goal Achievement: 05/11/23 Potential to Achieve Goals: Good    Frequency Min 5X/week        AM-PAC PT "6 Clicks" Mobility  Outcome Measure Help needed turning from your back to your side while in a flat bed without using bedrails?: A Little Help needed moving from lying on your back to sitting on the side of a flat bed without using bedrails?: A Little Help needed moving to and from a bed to a chair (including a wheelchair)?: A Little Help needed standing up from a chair using your arms (e.g., wheelchair or bedside chair)?: A Little Help needed to walk in hospital room?: A Little Help needed climbing 3-5 steps with a railing? : A Little 6 Click Score: 18    End of Session Equipment Utilized During Treatment: Back brace Activity Tolerance: Patient tolerated treatment well Patient left: in bed;with call bell/phone within reach;with family/visitor present Nurse Communication: Mobility status (pt wanting to d/c home) PT Visit Diagnosis: Unsteadiness on feet (R26.81);Muscle weakness (generalized) (M62.81)    Time: 6295-2841 PT Time Calculation (min) (ACUTE ONLY): 35 min   Charges:   PT Evaluation $PT Eval Low Complexity: 1 Low PT Treatments $Therapeutic Exercise: 8-22 mins PT General Charges $$ ACUTE PT VISIT: 1 Visit  Patrick Mathews, PT, DPT   Acute Rehabilitation Department Office [phone] Secure Chat Communication Preferred  Patrick Mathews 04/27/2023, 10:23 AM

## 2023-04-27 NOTE — Plan of Care (Signed)
Pt doing well. Pt and wife given D/C instructions with verbal understanding. Rx's were sent to the pharmacy by MD. Pt's incision is clean and dry with no sign of infection. Pt's IV was removed prior to D/C. Pt D/C'd home via wheelchair per MD order. Pt is stable @ D/C and has no other needs at this time. Amandine Covino, RN  

## 2023-04-27 NOTE — Progress Notes (Signed)
Subjective: The patient is alert and pleasant.  He looks well.  His wife is at the bedside.  Objective: Vital signs in last 24 hours: Temp:  [97 F (36.1 C)-98.5 F (36.9 C)] 98.2 F (36.8 C) (11/14 0406) Pulse Rate:  [58-86] 64 (11/14 0406) Resp:  [11-19] 18 (11/14 0406) BP: (115-142)/(55-83) 122/72 (11/14 0406) SpO2:  [91 %-100 %] 98 % (11/14 0406) Estimated body mass index is 30.42 kg/m as calculated from the following:   Height as of this encounter: 5\' 10"  (1.778 m).   Weight as of this encounter: 96.2 kg.   Intake/Output from previous day: 11/13 0701 - 11/14 0700 In: 2100 [I.V.:2100] Out: 1825 [Urine:1575; Blood:250] Intake/Output this shift: No intake/output data recorded.  Physical exam the patient is alert and oriented.  His lower extremity strength is grossly normal.  Lab Results: No results for input(s): "WBC", "HGB", "HCT", "PLT" in the last 72 hours. BMET No results for input(s): "NA", "K", "CL", "CO2", "GLUCOSE", "BUN", "CREATININE", "CALCIUM" in the last 72 hours.  Studies/Results: DG Lumbar Spine 2-3 Views  Result Date: 04/26/2023 CLINICAL DATA:  Surgical fusion of lumbar spine. EXAM: LUMBAR SPINE - 2-3 VIEW; DG C-ARM 1-60 MIN-NO REPORT Radiation exposure index: 24.34 mGy. COMPARISON:  Nov 02, 2021. FINDINGS: Four intraoperative fluoroscopic images were obtained the lower lumbar spine. These images demonstrate surgical interbody fusion of L5-S1, as well as bilateral intrapedicular screws at S1. IMPRESSION: Fluoroscopic guidance provided during lower lumbar surgery as noted above. Electronically Signed   By: Lupita Raider M.D.   On: 04/26/2023 16:55   DG C-Arm 1-60 Min-No Report  Result Date: 04/26/2023 Fluoroscopy was utilized by the requesting physician.  No radiographic interpretation.   DG C-Arm 1-60 Min-No Report  Result Date: 04/26/2023 Fluoroscopy was utilized by the requesting physician.  No radiographic interpretation.     Assessment/Plan: Postop day #1: The patient is doing well.  He may go home after he works with physical therapy this morning.  I gave him his discharge instructions and answered all their questions.  LOS: 1 day     Cristi Loron 04/27/2023, 8:17 AM

## 2023-04-28 MED FILL — Thrombin For Soln 5000 Unit: CUTANEOUS | Qty: 5000 | Status: AC

## 2023-04-28 NOTE — Anesthesia Postprocedure Evaluation (Signed)
Anesthesia Post Note  Patient: Patrick Mathews  Procedure(s) Performed: POSTERIOR LUMBAR INTERBODY FUSION-LUMBAR FIVE-SACRAL ONE, WITH INTERBODY PROSTHESIS AND POSTERIOR INSTRUMENTATION FROM LUMBAR FIVE TO ILIUM AND EXPLORATION OF FUSION LUMBAR TWO-LUMBAR FIVE (Back)     Patient location during evaluation: PACU Anesthesia Type: General Level of consciousness: awake and alert Pain management: pain level controlled Vital Signs Assessment: post-procedure vital signs reviewed and stable Respiratory status: spontaneous breathing, nonlabored ventilation, respiratory function stable and patient connected to nasal cannula oxygen Cardiovascular status: blood pressure returned to baseline and stable Postop Assessment: no apparent nausea or vomiting Anesthetic complications: no  No notable events documented.  Last Vitals:  Vitals:   04/27/23 0406 04/27/23 0921  BP: 122/72 121/75  Pulse: 64 65  Resp: 18 18  Temp: 36.8 C 36.4 C  SpO2: 98% 100%    Last Pain:  Vitals:   04/27/23 0800  TempSrc:   PainSc: 2                  [name] [name]

## 2023-05-04 MED FILL — Heparin Sodium (Porcine) Inj 1000 Unit/ML: INTRAMUSCULAR | Qty: 30 | Status: AC

## 2023-05-23 DIAGNOSIS — M4316 Spondylolisthesis, lumbar region: Secondary | ICD-10-CM | POA: Diagnosis not present

## 2023-06-01 ENCOUNTER — Ambulatory Visit (HOSPITAL_COMMUNITY): Payer: Medicare Other | Attending: Neurosurgery

## 2023-06-01 ENCOUNTER — Other Ambulatory Visit: Payer: Self-pay

## 2023-06-01 DIAGNOSIS — M792 Neuralgia and neuritis, unspecified: Secondary | ICD-10-CM | POA: Diagnosis not present

## 2023-06-01 DIAGNOSIS — R262 Difficulty in walking, not elsewhere classified: Secondary | ICD-10-CM | POA: Insufficient documentation

## 2023-06-01 DIAGNOSIS — R29898 Other symptoms and signs involving the musculoskeletal system: Secondary | ICD-10-CM | POA: Diagnosis not present

## 2023-06-01 DIAGNOSIS — R2689 Other abnormalities of gait and mobility: Secondary | ICD-10-CM | POA: Insufficient documentation

## 2023-06-01 NOTE — Therapy (Addendum)
OUTPATIENT PHYSICAL THERAPY THORACOLUMBAR EVALUATION   Patient Name: Patrick Mathews MRN: [id number] DOB:[birth year], 72 y.o., male Today's Date: 06/01/2023  END OF SESSION:  PT End of Session - 06/01/23 1401     Visit Number 1    Number of Visits 16    Authorization Type Medicare Part A/B    Progress Note Due on Visit 8    PT Start Time 1300    PT Stop Time 1350    PT Time Calculation (min) 50 min    Equipment Utilized During Treatment Back brace    Activity Tolerance Patient tolerated treatment well    Behavior During Therapy WFL for tasks assessed/performed            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   TTE 05/2021: 41 mm  CT 05/2021: 39 mm    Asthma    CAD (coronary artery disease)    Exertional dyspnea    GERD (gastroesophageal reflux disease)    Hyperlipidemia    Hypertension    Neuropathy    Primary localized osteoarthritis of left knee    Primary localized osteoarthritis of right knee 05/04/2020   Seasonal allergies    Past Surgical History:  Procedure Laterality Date   ANTERIOR CERVICAL DECOMP/DISCECTOMY FUSION  1999   Dr Charna Busman SURGERY     BUNIONECTOMY Right 2013   COLONOSCOPY N/A 11/06/2012   Procedure: COLONOSCOPY;  Surgeon: Dalia Heading, MD;  Location: [facility];  Service: Gastroenterology;  Laterality: N/A;   FOOT SURGERY Right 2013   bunionectomy   KNEE ARTHROSCOPY Bilateral 1990's   POSTERIOR LUMBAR FUSION  1985; 2009   Dr Jeral Fruit   TONSILLECTOMY  1950's   TOTAL HIP ARTHROPLASTY Right 12/07/2021   Procedure: TOTAL HIP ARTHROPLASTY ANTERIOR APPROACH;   Surgeon: Sheral Apley, MD;  Location: [facility];  Service: Orthopedics;  Laterality: Right;   TOTAL KNEE ARTHROPLASTY Left 07/14/2014   Procedure: LEFT TOTAL KNEE ARTHROPLASTY;  Surgeon: Nilda Simmer, MD;  Location: [facility];  Service: Orthopedics;  Laterality: Left;   TOTAL KNEE ARTHROPLASTY Right 05/11/2020   Procedure: TOTAL KNEE ARTHROPLASTY;  Surgeon: Salvatore Marvel, MD;  Location: [facility];  Service: Orthopedics;  Laterality: Right;   WISDOM TOOTH EXTRACTION     Patient Active Problem List   Diagnosis Date Noted   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   Preoperative cardiovascular examination 02/21/2023   Aortic insufficiency 02/21/2023   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   Essential hypertension 02/21/2023   History of lumbar fusion 08/09/2022   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   CAD (coronary artery disease) 08/16/2021   DOE (dyspnea on exertion) 08/16/2021   Tremor 04/27/2021   Gait abnormality 04/27/2021   Primary localized osteoarthritis of right knee 05/04/2020   Lumbar adjacent segment disease with spondylolisthesis 02/06/2020   Chest pressure 09/27/2019   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   Neck pain 06/23/2015   Morbid obesity (HCC) 05/08/2015   DJD (degenerative joint disease) of knee 07/14/2014   Acute upper respiratory infection 06/12/2014  Cough 06/12/2014   Primary localized osteoarthritis of left knee    Hyperlipidemia    GERD (gastroesophageal reflux disease)    Back pain    Dyspnea 05/07/2014   Multiple pulmonary nodules 05/07/2014   Thoracic radiculitis 08/19/2013    PCP: Assunta Found, MD  REFERRING PROVIDER: Tressie Stalker, MD  REFERRING DIAG: M43.16 (ICD-10-CM) - Spondylolisthesis, lumbar region,  POSTERIOR LUMBAR INTERBODY FUSION-LUMBAR FIVE-SACRAL ONE, WITH INTERBODY PROSTHESIS AND POSTERIOR INSTRUMENTATION FROM LUMBAR FIVE TO ILIUM AND EXPLORATION OF FUSION LUMBAR  TWO-LUMBAR FIVE (04/26/23)    Rationale for Evaluation and Treatment: Rehabilitation  THERAPY DIAG:  Intractable neuropathic pain of left lower extremity  Difficulty in walking, not elsewhere classified  Other abnormalities of gait and mobility  Weakness of left lower extremity  ONSET DATE: POSTERIOR LUMBAR INTERBODY FUSION-LUMBAR FIVE-SACRAL ONE, WITH INTERBODY PROSTHESIS AND POSTERIOR INSTRUMENTATION FROM LUMBAR FIVE TO ILIUM AND EXPLORATION OF FUSION LUMBAR TWO-LUMBAR FIVE (04/26/23)  SUBJECTIVE:                                                                                                                                                                                           SUBJECTIVE STATEMENT: Arrives to the clinic with some foot pain when he walks. However, denies pain at the moment even on the surgical area. Patient also reports that the L side feels weak and has been pre-existing before the surgery. Patient is also numb on the L knee down. Patient also reports that he tends to drag the L LE and he can trip. Patient had lumbar fusion on 04/26/23 because of disc degeneration. Patient was only admitted overnight and had PT the following day at the hospital and then was sent home. Patient had PT in the past which did not help. Patient is now 5 weeks post-op.   PERTINENT HISTORY:  R THR 2023, L TKR 2016, R TKR 2021, hx of back surgery  PAIN:  Are you having pain? Yes: NPRS scale: 5 Pain location: inner side of the L foot Pain description: throbbing, tingling Aggravating factors: walking around 0.5 miles, at night Relieving factors: gabapentin  PRECAUTIONS: Other: lumbar fusion protocol, back brace till the end of January 2025 (can be taken off when sitting)  RED FLAGS: None   WEIGHT BEARING RESTRICTIONS: No  FALLS:  Has patient fallen in last 6 months? No  LIVING ENVIRONMENT: Lives with: lives with their spouse Lives in: House/apartment Stairs: Yes: External: 5  steps; on left going up Has following equipment at home: Single point cane, Quad cane small base, Walker - 4 wheeled, and Ramped entry  OCCUPATION: retired  PLOF: Independent and Independent with basic  ADLs  PATIENT GOALS: "I would like to get back my strength on the left leg"  NEXT MD VISIT: March 2025  OBJECTIVE:  Note: Objective measures were completed at Evaluation unless otherwise noted.  DIAGNOSTIC FINDINGS:  04/26/23 EXAM: LUMBAR SPINE - 2-3 VIEW; DG C-ARM 1-60 MIN-NO REPORT   Radiation exposure index: 24.34 mGy.   COMPARISON:  Nov 02, 2021.   FINDINGS: Four intraoperative fluoroscopic images were obtained the lower lumbar spine. These images demonstrate surgical interbody fusion of L5-S1, as well as bilateral intrapedicular screws at S1.   IMPRESSION: Fluoroscopic guidance provided during lower lumbar surgery as noted above.    PATIENT SURVEYS:  FOTO [id number]  COGNITION: Overall cognitive status: Within functional limits for tasks assessed     SENSATION: Light touch: Impaired on L LE  MUSCLE LENGTH: Gastrocsoleus: moderate restriction on the L, mild restriction on R  POSTURE: rounded shoulders and forward head True leg length: R LE = 89 cm, L LE = 90.5 cm  LUMBAR ROM:   AROM Eval (Not tested)  Flexion   Extension   Right lateral flexion   Left lateral flexion   Right rotation   Left rotation    (Blank rows = not tested)  LOWER EXTREMITY ROM:     Active  Right eval Left eval  Hip flexion [facility]  Hip extension    Hip abduction    Hip adduction    Hip internal rotation    Hip external rotation    Knee flexion [facility] WFL  Knee extension [facility]  Ankle dorsiflexion [facility] WFL  Ankle plantarflexion [facility] WFL  Ankle inversion    Ankle eversion     (Blank rows = not tested)  LOWER EXTREMITY MMT:    MMT Right eval Left eval  Hip flexion 4- 4-  Hip extension    Hip abduction 4- 4-  Hip adduction    Hip internal rotation    Hip external  rotation    Knee flexion 4+ 4-  Knee extension 4+ 4-  Ankle dorsiflexion 4+ 3-  Ankle plantarflexion 4+ 3+  Ankle inversion    Ankle eversion     (Blank rows = not tested)  FUNCTIONAL TESTS:  5 times sit to stand: to be determined 7-8th week post op 2 minute walk test: 410 ft Tinetti POMA: 18 (balance + gait)  GAIT: Distance walked: 410 ft Assistive device utilized: None Level of assistance: Complete Independence Comments: done with , noticeable foot slap on initial contact  TREATMENT DATE:  06/01/23 Evaluation and patient education done                                                                                                                                 PATIENT EDUCATION:  Education details: Educated on the pathoanatomy of lumbar fusion. Educated on the goals and course of rehab.  Person educated: Patient Education method: Explanation Education comprehension: verbalized understanding  HOME EXERCISE  PROGRAM: None provided to date  ASSESSMENT:  CLINICAL IMPRESSION: Patient is a 72 y.o. male who was seen today for physical therapy evaluation and treatment for s/p lumbar fusion. Patient's condition is further defined by difficulty with walking due to pain, weakness, impaired balance, and decreased soft tissue extensibility. Skilled PT is required to address the impairments and functional limitations listed below. Patient states that he will be out of town for the next 2-3 weeks and maybe back from PT around 06/28/2023. Patient was advised to continue his HEP provided by his PT from the hospital. Patient gave excellent understanding.  OBJECTIVE IMPAIRMENTS: Abnormal gait, decreased activity tolerance, decreased balance, difficulty walking, decreased strength, impaired flexibility, and pain.   ACTIVITY LIMITATIONS: carrying, lifting, bending, sitting, standing, squatting, stairs, and transfers  PARTICIPATION LIMITATIONS: meal prep, cleaning, laundry, driving,  shopping, community activity, and yard work  PERSONAL FACTORS: Age, Past/current experiences, and 3+ comorbidities: R THR 2023, L TKR 2016, R TKR 2021, hx of back surgery  are also affecting patient's functional outcome.   REHAB POTENTIAL: Fair    CLINICAL DECISION MAKING: Evolving/moderate complexity  EVALUATION COMPLEXITY: Moderate   GOALS: Goals reviewed with patient? Yes  SHORT TERM GOALS: Target date: On the 4th PT visit  Pt will demonstrate indep in HEP to facilitate carry-over of skilled services and improve functional outcomes Goal status: INITIAL  LONG TERM GOALS: Target date: On the 16th PT visit  Pt will increase by at least 40 ft in order to demonstrate clinically significant improvement in community ambulation Baseline: 410 ft Goal status: INITIAL  2.  Patient will demonstrate increase in Tinetti Score by 2 points in order to demonstrate clinically significant improvement in balance and decreased risk for falls  Baseline: 18 Goal status: INITIAL  3.  Pt will demonstrate increase in LE strength to 4-/5 to facilitate ease and safety in ambulation Baseline: 3-/5 Goal status: INITIAL  4.  Pt will decrease 5TSTS by at least 3 seconds in order to demonstrate clinically significant improvement in LE strength  Baseline: to be determined Goal status: INITIAL  5.  Pt will increase FOTO to at least 59 in order to demonstrate significant improvement in function related to  ADLs and ambulation Baseline: 49.8 Goal status: INITIAL  PLAN:  PT FREQUENCY: 2x/week  PT DURATION: 8 weeks  PLANNED INTERVENTIONS: 97164- PT Re-evaluation, 97110-Therapeutic exercises, 97530- Therapeutic activity, 97112- Neuromuscular re-education, 97535- Self Care, 16109- Manual therapy, [id number]- Gait training, Patient/Family education, Cryotherapy, and Moist heat.  PLAN FOR NEXT SESSION: Provide HEP. Begin flexibility, core and LE strengthening per protocol.   Tish Frederickson. Tatym Schermer, PT, DPT,  OCS Board-Certified Clinical Specialist in Orthopedic PT PT Compact Privilege # ([place]): [id number] T 06/01/2023, 2:21 PM

## 2023-06-01 NOTE — Addendum Note (Signed)
Addended by: Trudee Grip on: 06/01/2023 02:27 PM   Modules accepted: Orders

## 2023-06-26 DIAGNOSIS — X32XXXD Exposure to sunlight, subsequent encounter: Secondary | ICD-10-CM | POA: Diagnosis not present

## 2023-06-26 DIAGNOSIS — D225 Melanocytic nevi of trunk: Secondary | ICD-10-CM | POA: Diagnosis not present

## 2023-06-26 DIAGNOSIS — L570 Actinic keratosis: Secondary | ICD-10-CM | POA: Diagnosis not present

## 2023-06-26 DIAGNOSIS — L218 Other seborrheic dermatitis: Secondary | ICD-10-CM | POA: Diagnosis not present

## 2023-06-27 ENCOUNTER — Encounter (HOSPITAL_COMMUNITY): Payer: Medicare Other

## 2023-06-27 ENCOUNTER — Ambulatory Visit (HOSPITAL_COMMUNITY): Payer: PPO | Attending: Neurosurgery

## 2023-06-27 DIAGNOSIS — M792 Neuralgia and neuritis, unspecified: Secondary | ICD-10-CM | POA: Insufficient documentation

## 2023-06-27 DIAGNOSIS — R262 Difficulty in walking, not elsewhere classified: Secondary | ICD-10-CM | POA: Insufficient documentation

## 2023-06-27 DIAGNOSIS — R29898 Other symptoms and signs involving the musculoskeletal system: Secondary | ICD-10-CM | POA: Diagnosis not present

## 2023-06-27 DIAGNOSIS — R2689 Other abnormalities of gait and mobility: Secondary | ICD-10-CM | POA: Diagnosis not present

## 2023-06-27 DIAGNOSIS — M6281 Muscle weakness (generalized): Secondary | ICD-10-CM | POA: Diagnosis not present

## 2023-06-27 DIAGNOSIS — K219 Gastro-esophageal reflux disease without esophagitis: Secondary | ICD-10-CM | POA: Diagnosis not present

## 2023-06-27 DIAGNOSIS — K5900 Constipation, unspecified: Secondary | ICD-10-CM | POA: Diagnosis not present

## 2023-06-27 NOTE — Therapy (Signed)
 OUTPATIENT PHYSICAL THERAPY THORACOLUMBAR TREATMENT   Patient Name: Patrick Mathews MRN: [id number] DOB:04/17/1951, 73 y.o., male Today's Date: 06/27/2023  END OF SESSION:  PT End of Session - 06/27/23 0900     Visit Number 2    Number of Visits 16    Date for PT Re-Evaluation 08/24/23    Authorization Type Medicare Part A/B    Progress Note Due on Visit 8    PT Start Time [phone]    PT Stop Time 0930    PT Time Calculation (min) 43 min    Equipment Utilized During Treatment Back brace    Activity Tolerance Patient tolerated treatment well    Behavior During Therapy WFL for tasks assessed/performed             Past Medical History:  Diagnosis Date   Adjacent segment disease with spinal stenosis    Aortic insufficiency 02/21/2023   TTE 05/25/2021: EF 55-60, no RWMA, GLS -17.9, normal RVSF, mild LAE, trivial MR, mild to moderate AI, AV sclerosis, mild dilation of aortic root (38 mm), mild dilation of ascending aorta (41 mm), RAP 3    Arthritis    knees; back (07/14/2014)   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   TTE 05/2021: 41 mm  CT 05/2021: 39 mm    Asthma    CAD (coronary artery disease)    Exertional dyspnea    GERD (gastroesophageal reflux disease)    Hyperlipidemia    Hypertension    Neuropathy    Primary localized osteoarthritis of left knee    Primary localized osteoarthritis of right knee 05/04/2020   Seasonal allergies    Past Surgical History:  Procedure Laterality Date   ANTERIOR CERVICAL DECOMP/DISCECTOMY FUSION  1999   Dr Leeann SANES SURGERY     BUNIONECTOMY Right 2013   COLONOSCOPY N/A 11/06/2012   Procedure: COLONOSCOPY;  Surgeon: Oneil DELENA Budge, MD;  Location: [facility];  Service: Gastroenterology;  Laterality: N/A;   FOOT SURGERY Right 2013   bunionectomy   KNEE ARTHROSCOPY Bilateral 1990's   POSTERIOR LUMBAR FUSION  1985; 2009   Dr Leeann   TONSILLECTOMY  1950's   TOTAL HIP ARTHROPLASTY Right 12/07/2021   Procedure: TOTAL HIP  ARTHROPLASTY ANTERIOR APPROACH;  Surgeon: Beverley Evalene BIRCH, MD;  Location: [facility];  Service: Orthopedics;  Laterality: Right;   TOTAL KNEE ARTHROPLASTY Left 07/14/2014   Procedure: LEFT TOTAL KNEE ARTHROPLASTY;  Surgeon: Lamar DELENA Millman, MD;  Location: [facility];  Service: Orthopedics;  Laterality: Left;   TOTAL KNEE ARTHROPLASTY Right 05/11/2020   Procedure: TOTAL KNEE ARTHROPLASTY;  Surgeon: Millman Lamar, MD;  Location: [facility];  Service: Orthopedics;  Laterality: Right;   WISDOM TOOTH EXTRACTION     Patient Active Problem List   Diagnosis Date Noted   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   Preoperative cardiovascular examination 02/21/2023   Aortic insufficiency 02/21/2023   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   Essential hypertension 02/21/2023   History of lumbar fusion 08/09/2022   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   CAD (coronary artery disease) 08/16/2021   DOE (dyspnea on exertion) 08/16/2021   Tremor 04/27/2021   Gait abnormality 04/27/2021   Primary localized osteoarthritis of right knee 05/04/2020   Lumbar adjacent segment disease with spondylolisthesis 02/06/2020   Chest pressure 09/27/2019   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   Neck pain 06/23/2015   Morbid obesity (HCC) 05/08/2015   DJD (degenerative joint disease) of knee 07/14/2014  Acute upper respiratory infection 06/12/2014   Cough 06/12/2014   Primary localized osteoarthritis of left knee    Hyperlipidemia    GERD (gastroesophageal reflux disease)    Back pain    Dyspnea 05/07/2014   Multiple pulmonary nodules 05/07/2014   Thoracic radiculitis 08/19/2013    PCP: Marvine Rush, MD  REFERRING PROVIDER: Mavis Purchase, MD  REFERRING DIAG: M43.16 (ICD-10-CM) - Spondylolisthesis, lumbar region,  POSTERIOR LUMBAR INTERBODY FUSION-LUMBAR FIVE-SACRAL ONE, WITH INTERBODY PROSTHESIS AND POSTERIOR INSTRUMENTATION FROM LUMBAR FIVE TO ILIUM AND  EXPLORATION OF FUSION LUMBAR TWO-LUMBAR FIVE (04/26/23)    Rationale for Evaluation and Treatment: Rehabilitation  THERAPY DIAG:  No diagnosis found.  ONSET DATE: POSTERIOR LUMBAR INTERBODY FUSION-LUMBAR FIVE-SACRAL ONE, WITH INTERBODY PROSTHESIS AND POSTERIOR INSTRUMENTATION FROM LUMBAR FIVE TO ILIUM AND EXPLORATION OF FUSION LUMBAR TWO-LUMBAR FIVE (04/26/23)  SUBJECTIVE:                                                                                                                                                                                           SUBJECTIVE STATEMENT: Current low back pain = 4/10. Denies recent trauma or falls. States that the L LE is still numb.  EVAL: Arrives to the clinic with some foot pain when he walks. However, denies pain at the moment even on the surgical area. Patient also reports that the L side feels weak and has been pre-existing before the surgery. Patient is also numb on the L knee down. Patient also reports that he tends to drag the L LE and he can trip. Patient had lumbar fusion on 04/26/23 because of disc degeneration. Patient was only admitted overnight and had PT the following day at the hospital and then was sent home. Patient had PT in the past which did not help. Patient is now 5 weeks post-op.   PERTINENT HISTORY:  R THR 2023, L TKR 2016, R TKR 2021, hx of back surgery  PAIN:  Are you having pain? Yes: NPRS scale: 5 Pain location: inner side of the L foot Pain description: throbbing, tingling Aggravating factors: walking around 0.5 miles, at night Relieving factors: gabapentin   PRECAUTIONS: Other: lumbar fusion protocol, back brace till the end of January 2025 (can be taken off when sitting)  RED FLAGS: None   WEIGHT BEARING RESTRICTIONS: No  FALLS:  Has patient fallen in last 6 months? No  LIVING ENVIRONMENT: Lives with: lives with their spouse Lives in: House/apartment Stairs: Yes: External: 5 steps; on left going up Has  following equipment at home: Single point cane, Quad cane small base, Walker - 4 wheeled, and Ramped entry  OCCUPATION: retired  PLOF: Independent  and Independent with basic ADLs  PATIENT GOALS: I would like to get back my strength on the left leg  NEXT MD VISIT: March 2025  OBJECTIVE:  Note: Objective measures were completed at Evaluation unless otherwise noted.  DIAGNOSTIC FINDINGS:  04/26/23 EXAM: LUMBAR SPINE - 2-3 VIEW; DG C-ARM 1-60 MIN-NO REPORT   Radiation exposure index: 24.34 mGy.   COMPARISON:  Nov 02, 2021.   FINDINGS: Four intraoperative fluoroscopic images were obtained the lower lumbar spine. These images demonstrate surgical interbody fusion of L5-S1, as well as bilateral intrapedicular screws at S1.   IMPRESSION: Fluoroscopic guidance provided during lower lumbar surgery as noted above.    PATIENT SURVEYS:  FOTO [id number]  COGNITION: Overall cognitive status: Within functional limits for tasks assessed     SENSATION: Light touch: Impaired on L LE  MUSCLE LENGTH: Gastrocsoleus: moderate restriction on the L, mild restriction on R  POSTURE: rounded shoulders and forward head True leg length: R LE = 89 cm, L LE = 90.5 cm  LUMBAR ROM:   AROM Eval (Not tested)  Flexion   Extension   Right lateral flexion   Left lateral flexion   Right rotation   Left rotation    (Blank rows = not tested)  LOWER EXTREMITY ROM:     Active  Right eval Left eval  Hip flexion [facility]  Hip extension    Hip abduction    Hip adduction    Hip internal rotation    Hip external rotation    Knee flexion [facility] WFL  Knee extension [facility]  Ankle dorsiflexion [facility] WFL  Ankle plantarflexion [facility] WFL  Ankle inversion    Ankle eversion     (Blank rows = not tested)  LOWER EXTREMITY MMT:    MMT Right eval Left eval  Hip flexion 4- 4-  Hip extension    Hip abduction 4- 4-  Hip adduction    Hip internal rotation    Hip external rotation    Knee flexion 4+ 4-   Knee extension 4+ 4-  Ankle dorsiflexion 4+ 3-  Ankle plantarflexion 4+ 3+  Ankle inversion    Ankle eversion     (Blank rows = not tested)  FUNCTIONAL TESTS:  5 times sit to stand: to be determined 7-8th week post op 2 minute walk test: 410 ft Tinetti POMA: 18 (balance + gait)  GAIT: Distance walked: 410 ft Assistive device utilized: None Level of assistance: Complete Independence Comments: done with , noticeable foot slap on initial contact  TREATMENT DATE:  06/27/23 Reviewed goals Seated hamstring stretch x 30 x 3 Seated piriformis stretch x 30 x 3 Seated calf stretch with a strap x 30 x 2 Supine marches, with draw in, x 10 x 2 Supine heel slides, with draw in, x 10 x 2 Supine hip abduction, with draw in x 10 x 2 Education on log rolling - patient  gave excellent verbal understanding  06/01/23 Evaluation and patient education done  PATIENT EDUCATION:  Education details: Educated on the pathoanatomy of lumbar fusion. Educated on the goals and course of rehab.  Person educated: Patient Education method: Explanation Education comprehension: verbalized understanding  HOME EXERCISE PROGRAM: Access Code: [email or web address] URL: [email or web address] Date: 06/27/2023 Prepared by: Vinie Haver  Exercises - Seated Piriformis Stretch  - 1 x daily - 5-7 x weekly - 3 reps - 30 hold - Seated Hamstring Stretch  - 1 x daily - 5-7 x weekly - 3 reps - 30 hold - Seated Calf Stretch with Strap  - 1 x daily - 5-7 x weekly - 3 reps - 30 hold - Supine March  - 1 x daily - 5-7 x weekly - 2 sets - 10 reps - Supine Heel Slide  - 1 x daily - 5-7 x weekly - 2 sets - 10 reps - Supine Hip Abduction  - 1 x daily - 5-7 x weekly - 2 sets - 10 reps  Patient Education - Logroll With Lumbar Precautions  ASSESSMENT:  CLINICAL IMPRESSION: Patient is 8  weeks post-op. Interventions today were geared towards LE flexibility and core strengthening. Tolerated all activities without worsening of symptoms. Demonstrated appropriate levels of fatigue. Pacing of activities is slow. Provided slight amount of cueing to ensure correct execution of activity with good carry-over. To date, skilled PT is required to address the impairments and improve function.  EVAL: Patient is a 73 y.o. male who was seen today for physical therapy evaluation and treatment for s/p lumbar fusion. Patient's condition is further defined by difficulty with walking due to pain, weakness, impaired balance, and decreased soft tissue extensibility. Skilled PT is required to address the impairments and functional limitations listed below. Patient states that he will be out of town for the next 2-3 weeks and maybe back from PT around 06/28/2023. Patient was advised to continue his HEP provided by his PT from the hospital. Patient gave excellent understanding.  OBJECTIVE IMPAIRMENTS: Abnormal gait, decreased activity tolerance, decreased balance, difficulty walking, decreased strength, impaired flexibility, and pain.   ACTIVITY LIMITATIONS: carrying, lifting, bending, sitting, standing, squatting, stairs, and transfers  PARTICIPATION LIMITATIONS: meal prep, cleaning, laundry, driving, shopping, community activity, and yard work  PERSONAL FACTORS: Age, Past/current experiences, and 3+ comorbidities: R THR 2023, L TKR 2016, R TKR 2021, hx of back surgery  are also affecting patient's functional outcome.   REHAB POTENTIAL: Fair    CLINICAL DECISION MAKING: Evolving/moderate complexity  EVALUATION COMPLEXITY: Moderate   GOALS: Goals reviewed with patient? Yes  SHORT TERM GOALS: Target date: On the 4th PT visit  Pt will demonstrate indep in HEP to facilitate carry-over of skilled services and improve functional outcomes Goal status: INITIAL  LONG TERM GOALS: Target date: On the 16th PT  visit  Pt will increase by at least 40 ft in order to demonstrate clinically significant improvement in community ambulation Baseline: 410 ft Goal status: INITIAL  2.  Patient will demonstrate increase in Tinetti Score by 2 points in order to demonstrate clinically significant improvement in balance and decreased risk for falls  Baseline: 18 Goal status: INITIAL  3.  Pt will demonstrate increase in LE strength to 4-/5 to facilitate ease and safety in ambulation Baseline: 3-/5 Goal status: INITIAL  4.  Pt will decrease 5TSTS by at least 3 seconds in order to demonstrate clinically significant improvement in LE strength  Baseline: to be determined Goal status: INITIAL  5.  Pt will increase FOTO to at least 59 in order to demonstrate  significant improvement in function related to  ADLs and ambulation Baseline: 49.8 Goal status: INITIAL  PLAN:  PT FREQUENCY: 2x/week  PT DURATION: 8 weeks  PLANNED INTERVENTIONS: [id number]- PT Re-evaluation, 97110-Therapeutic exercises, 97530- Therapeutic activity, 97112- Neuromuscular re-education, 97535- Self Care, [id number]- Manual therapy, [id number]- Gait training, Patient/Family education, Cryotherapy, and Moist heat.  PLAN FOR NEXT SESSION: Continue POC and may progress as tolerated with emphasis on flexibility, core and LE strengthening per protocol.   Vinie CROME. Kanitra Purifoy, PT, DPT, OCS Board-Certified Clinical Specialist in Orthopedic PT PT Compact Privilege # ([place]): [id number] T 06/27/2023, 9:01 AM

## 2023-06-29 ENCOUNTER — Ambulatory Visit (HOSPITAL_COMMUNITY): Payer: PPO

## 2023-06-29 DIAGNOSIS — R29898 Other symptoms and signs involving the musculoskeletal system: Secondary | ICD-10-CM

## 2023-06-29 DIAGNOSIS — M6281 Muscle weakness (generalized): Secondary | ICD-10-CM

## 2023-06-29 DIAGNOSIS — R2689 Other abnormalities of gait and mobility: Secondary | ICD-10-CM

## 2023-06-29 DIAGNOSIS — M792 Neuralgia and neuritis, unspecified: Secondary | ICD-10-CM | POA: Diagnosis not present

## 2023-06-29 DIAGNOSIS — R262 Difficulty in walking, not elsewhere classified: Secondary | ICD-10-CM

## 2023-06-29 NOTE — Therapy (Addendum)
OUTPATIENT PHYSICAL THERAPY THORACOLUMBAR TREATMENT   Patient Name: Patrick Mathews MRN: [id number] DOB:June 08, 1951, 73 y.o., male Today's Date: 06/29/2023  END OF SESSION:  PT End of Session - 06/29/23 1308     Visit Number 3    Number of Visits 16    Date for PT Re-Evaluation 08/24/23    Authorization Type Medicare Part A/B    Progress Note Due on Visit 8    PT Start Time 1300    PT Stop Time 1340    PT Time Calculation (min) 40 min    Equipment Utilized During Treatment Back brace    Activity Tolerance Patient tolerated treatment well    Behavior During Therapy WFL for tasks assessed/performed            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   TTE 05/2021: 41 mm  CT 05/2021: 39 mm    Asthma    CAD (coronary artery disease)    Exertional dyspnea    GERD (gastroesophageal reflux disease)    Hyperlipidemia    Hypertension    Neuropathy    Primary localized osteoarthritis of left knee    Primary localized osteoarthritis of right knee 05/04/2020   Seasonal allergies    Past Surgical History:  Procedure Laterality Date   ANTERIOR CERVICAL DECOMP/DISCECTOMY FUSION  1999   Dr Charna Busman SURGERY     BUNIONECTOMY Right 2013   COLONOSCOPY N/A 11/06/2012   Procedure: COLONOSCOPY;  Surgeon: Dalia Heading, MD;  Location: [facility];  Service: Gastroenterology;  Laterality: N/A;   FOOT SURGERY Right 2013   bunionectomy   KNEE ARTHROSCOPY Bilateral 1990's   POSTERIOR LUMBAR FUSION  1985; 2009   Dr Jeral Fruit   TONSILLECTOMY  1950's   TOTAL HIP ARTHROPLASTY Right 12/07/2021   Procedure: TOTAL HIP  ARTHROPLASTY ANTERIOR APPROACH;  Surgeon: Sheral Apley, MD;  Location: [facility];  Service: Orthopedics;  Laterality: Right;   TOTAL KNEE ARTHROPLASTY Left 07/14/2014   Procedure: LEFT TOTAL KNEE ARTHROPLASTY;  Surgeon: Nilda Simmer, MD;  Location: [facility];  Service: Orthopedics;  Laterality: Left;   TOTAL KNEE ARTHROPLASTY Right 05/11/2020   Procedure: TOTAL KNEE ARTHROPLASTY;  Surgeon: Salvatore Marvel, MD;  Location: [facility];  Service: Orthopedics;  Laterality: Right;   WISDOM TOOTH EXTRACTION     Patient Active Problem List   Diagnosis Date Noted   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   Preoperative cardiovascular examination 02/21/2023   Aortic insufficiency 02/21/2023   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   Essential hypertension 02/21/2023   History of lumbar fusion 08/09/2022   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   CAD (coronary artery disease) 08/16/2021   DOE (dyspnea on exertion) 08/16/2021   Tremor 04/27/2021   Gait abnormality 04/27/2021   Primary localized osteoarthritis of right knee 05/04/2020   Lumbar adjacent segment disease with spondylolisthesis 02/06/2020   Chest pressure 09/27/2019   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   Neck pain 06/23/2015   Morbid obesity (HCC) 05/08/2015   DJD (degenerative joint disease) of knee 07/14/2014  Acute upper respiratory infection 06/12/2014   Cough 06/12/2014   Primary localized osteoarthritis of left knee    Hyperlipidemia    GERD (gastroesophageal reflux disease)    Back pain    Dyspnea 05/07/2014   Multiple pulmonary nodules 05/07/2014   Thoracic radiculitis 08/19/2013    PCP: Assunta Found, MD  REFERRING PROVIDER: Tressie Stalker, MD  REFERRING DIAG: M43.16 (ICD-10-CM) - Spondylolisthesis, lumbar region,  POSTERIOR LUMBAR INTERBODY FUSION-LUMBAR FIVE-SACRAL ONE, WITH INTERBODY PROSTHESIS AND POSTERIOR INSTRUMENTATION FROM LUMBAR FIVE TO ILIUM AND  EXPLORATION OF FUSION LUMBAR TWO-LUMBAR FIVE (04/26/23)    Rationale for Evaluation and Treatment: Rehabilitation  THERAPY DIAG:  Difficulty in walking, not elsewhere classified  Muscle weakness (generalized)  Weakness of left lower extremity  Other abnormalities of gait and mobility  ONSET DATE: POSTERIOR LUMBAR INTERBODY FUSION-LUMBAR FIVE-SACRAL ONE, WITH INTERBODY PROSTHESIS AND POSTERIOR INSTRUMENTATION FROM LUMBAR FIVE TO ILIUM AND EXPLORATION OF FUSION LUMBAR TWO-LUMBAR FIVE (04/26/23)  SUBJECTIVE:                                                                                                                                                                                           SUBJECTIVE STATEMENT: Denies low back pain today. Patient states that he was sore after the last session but he's doing better now. Patient denies any issues with his HEP. Patient is still concerned that he's having a hard time picking up the L LE when he's walking.  EVAL: Arrives to the clinic with some foot pain when he walks. However, denies pain at the moment even on the surgical area. Patient also reports that the L side feels weak and has been pre-existing before the surgery. Patient is also numb on the L knee down. Patient also reports that he tends to drag the L LE and he can trip. Patient had lumbar fusion on 04/26/23 because of disc degeneration. Patient was only admitted overnight and had PT the following day at the hospital and then was sent home. Patient had PT in the past which did not help. Patient is now 5 weeks post-op.   PERTINENT HISTORY:  R THR 2023, L TKR 2016, R TKR 2021, hx of back surgery  PAIN:  Are you having pain? Yes: NPRS scale: 5 Pain location: inner side of the L foot Pain description: throbbing, tingling Aggravating factors: walking around 0.5 miles, at night Relieving factors: gabapentin  PRECAUTIONS: Other: lumbar fusion protocol, back brace till the end of January  2025 (can be taken off when sitting)  RED FLAGS: None   WEIGHT BEARING RESTRICTIONS: No  FALLS:  Has patient fallen in last 6 months? No  LIVING ENVIRONMENT: Lives with: lives with their spouse Lives in: House/apartment Stairs: Yes: External: 5 steps; on left going up Has following equipment at home: Single point cane, Quad cane small base, Walker - 4 wheeled, and Ramped entry  OCCUPATION: retired  PLOF: Independent and Independent with basic ADLs  PATIENT GOALS: "I would like to get back my strength on the left leg"  NEXT MD VISIT: March 2025  OBJECTIVE:  Note: Objective measures were completed at Evaluation unless otherwise noted.  DIAGNOSTIC FINDINGS:  04/26/23 EXAM: LUMBAR SPINE - 2-3 VIEW; DG C-ARM 1-60 MIN-NO REPORT   Radiation exposure index: 24.34 mGy.   COMPARISON:  Nov 02, 2021.   FINDINGS: Four intraoperative fluoroscopic images were obtained the lower lumbar spine. These images demonstrate surgical interbody fusion of L5-S1, as well as bilateral intrapedicular screws at S1.   IMPRESSION: Fluoroscopic guidance provided during lower lumbar surgery as noted above.    PATIENT SURVEYS:  FOTO [id number]  COGNITION: Overall cognitive status: Within functional limits for tasks assessed     SENSATION: Light touch: Impaired on L LE  MUSCLE LENGTH: Gastrocsoleus: moderate restriction on the L, mild restriction on R  POSTURE: rounded shoulders and forward head True leg length: R LE = 89 cm, L LE = 90.5 cm  LUMBAR ROM:   AROM Eval (Not tested)  Flexion   Extension   Right lateral flexion   Left lateral flexion   Right rotation   Left rotation    (Blank rows = not tested)  LOWER EXTREMITY ROM:     Active  Right eval Left eval  Hip flexion [facility]  Hip extension    Hip abduction    Hip adduction    Hip internal rotation    Hip external rotation    Knee flexion [facility] WFL  Knee extension [facility]  Ankle dorsiflexion [facility] WFL  Ankle  plantarflexion [facility] WFL  Ankle inversion    Ankle eversion     (Blank rows = not tested)  LOWER EXTREMITY MMT:    MMT Right eval Left eval  Hip flexion 4- 4-  Hip extension    Hip abduction 4- 4-  Hip adduction    Hip internal rotation    Hip external rotation    Knee flexion 4+ 4-  Knee extension 4+ 4-  Ankle dorsiflexion 4+ 3-  Ankle plantarflexion 4+ 3+  Ankle inversion    Ankle eversion     (Blank rows = not tested)  FUNCTIONAL TESTS:  5 times sit to stand: to be determined 7-8th week post op 2 minute walk test: 410 ft Tinetti POMA: 18 (balance + gait)  GAIT: Distance walked: 410 ft Assistive device utilized: None Level of assistance: Complete Independence Comments: done with , noticeable foot slap on initial contact  TREATMENT DATE:  06/29/23 Seated hamstring stretch x 30" x 3 Seated piriformis stretch x 30" x 3 Seated hip flexor stretch x 30" x 2 Gastrocnemius slant board x 30" x 3 Hooklying clamshells, RTB x 10 x 2 Supine, alternating shoulder flexion, with draw in x 10 x 2 Supine marches, with draw in, x 10 x 2 Supine alternating heel slides, with draw in, x 10 x 2 Supine bent knee fall outs with draw in x 10 x 2  06/27/23 Reviewed goals Seated hamstring stretch x 30" x 3 Seated piriformis stretch x 30" x 3 Seated calf stretch with a strap x 30" x 2 Supine marches, with draw in, x 10 x 2 Supine heel  slides, with draw in, x 10 x 2 Supine hip abduction, with draw in x 10 x 2 Education on log rolling - patient  gave excellent verbal understanding  06/01/23 Evaluation and patient education done                                                                                                                                 PATIENT EDUCATION:  Education details: Educated on the pathoanatomy of lumbar fusion. Educated on the goals and course of rehab.  Person educated: Patient Education method: Explanation Education comprehension: verbalized  understanding  HOME EXERCISE PROGRAM: Access Code: [email or web address] URL: [email or web address] 06/29/2023 - Seated Hip Flexor Stretch  - 1 x daily - 5-7 x weekly - 3 reps - 30 hold - Hooklying Clamshell with Resistance  - 1 x daily - 5-7 x weekly - 2 sets - 10 reps  Date: 06/27/2023 Prepared by: Krystal Clark  Exercises - Seated Piriformis Stretch  - 1 x daily - 5-7 x weekly - 3 reps - 30 hold - Seated Hamstring Stretch  - 1 x daily - 5-7 x weekly - 3 reps - 30 hold - Seated Calf Stretch with Strap  - 1 x daily - 5-7 x weekly - 3 reps - 30 hold - Supine March  - 1 x daily - 5-7 x weekly - 2 sets - 10 reps - Supine Heel Slide  - 1 x daily - 5-7 x weekly - 2 sets - 10 reps - Supine Hip Abduction  - 1 x daily - 5-7 x weekly - 2 sets - 10 reps  Patient Education - Logroll With Lumbar Precautions  ASSESSMENT:  CLINICAL IMPRESSION: Patient is 8 weeks post-op. Interventions today were geared towards LE flexibility and core strengthening. Tolerated all activities without worsening of symptoms. Demonstrated appropriate levels of fatigue. Pacing of activities is still slow. Provided slight amount of cueing to ensure correct execution of activity with good carry-over. To date, skilled PT is required to address the impairments and improve function.  EVAL: Patient is a 73 y.o. male who was seen today for physical therapy evaluation and treatment for s/p lumbar fusion. Patient's condition is further defined by difficulty with walking due to pain, weakness, impaired balance, and decreased soft tissue extensibility. Skilled PT is required to address the impairments and functional limitations listed below. Patient states that he will be out of town for the next 2-3 weeks and maybe back from PT around 06/28/2023. Patient was advised to continue his HEP provided by his PT from the hospital. Patient gave excellent understanding.  OBJECTIVE IMPAIRMENTS: Abnormal gait, decreased activity tolerance,  decreased balance, difficulty walking, decreased strength, impaired flexibility, and pain.   ACTIVITY LIMITATIONS: carrying, lifting, bending, sitting, standing, squatting, stairs, and transfers  PARTICIPATION LIMITATIONS: meal prep, cleaning, laundry, driving, shopping, community activity, and yard work  PERSONAL FACTORS: Age, Past/current experiences, and 3+ comorbidities: R THR 2023, L TKR 2016, R TKR  2021, hx of back surgery  are also affecting patient's functional outcome.   REHAB POTENTIAL: Fair    CLINICAL DECISION MAKING: Evolving/moderate complexity  EVALUATION COMPLEXITY: Moderate   GOALS: Goals reviewed with patient? Yes  SHORT TERM GOALS: Target date: On the 4th PT visit  Pt will demonstrate indep in HEP to facilitate carry-over of skilled services and improve functional outcomes Goal status: INITIAL  LONG TERM GOALS: Target date: On the 16th PT visit  Pt will increase by at least 40 ft in order to demonstrate clinically significant improvement in community ambulation Baseline: 410 ft Goal status: INITIAL  2.  Patient will demonstrate increase in Tinetti Score by 2 points in order to demonstrate clinically significant improvement in balance and decreased risk for falls  Baseline: 18 Goal status: INITIAL  3.  Pt will demonstrate increase in LE strength to 4-/5 to facilitate ease and safety in ambulation Baseline: 3-/5 Goal status: INITIAL  4.  Pt will decrease 5TSTS by at least 3 seconds in order to demonstrate clinically significant improvement in LE strength  Baseline: to be determined Goal status: INITIAL  5.  Pt will increase FOTO to at least 59 in order to demonstrate significant improvement in function related to  ADLs and ambulation Baseline: 49.8 Goal status: INITIAL  PLAN:  PT FREQUENCY: 2x/week  PT DURATION: 8 weeks  PLANNED INTERVENTIONS: 97164- PT Re-evaluation, 97110-Therapeutic exercises, 97530- Therapeutic activity, 97112-  Neuromuscular re-education, 97535- Self Care, 16109- Manual therapy, [id number]- Gait training, Patient/Family education, Cryotherapy, and Moist heat.  PLAN FOR NEXT SESSION: Continue POC and may progress as tolerated with emphasis on flexibility, core and LE strengthening per protocol.   Tish Frederickson. Muhammed Teutsch, PT, DPT, OCS Board-Certified Clinical Specialist in Orthopedic PT PT Compact Privilege # ([place]): [id number] T 06/29/2023, 1:09 PM

## 2023-07-04 ENCOUNTER — Ambulatory Visit
Admission: EM | Admit: 2023-07-04 | Discharge: 2023-07-04 | Disposition: A | Discharge status: Home / Self Care | Payer: 59 | Attending: Family Medicine | Admitting: Family Medicine

## 2023-07-04 ENCOUNTER — Encounter: Payer: Self-pay | Admitting: Emergency Medicine

## 2023-07-04 DIAGNOSIS — J069 Acute upper respiratory infection, unspecified: Secondary | ICD-10-CM

## 2023-07-04 DIAGNOSIS — J4521 Mild intermittent asthma with (acute) exacerbation: Secondary | ICD-10-CM

## 2023-07-04 LAB — POC COVID19/FLU A&B COMBO
Covid Antigen, POC: NEGATIVE
Influenza A Antigen, POC: NEGATIVE
Influenza B Antigen, POC: NEGATIVE

## 2023-07-04 MED ORDER — IPRATROPIUM-ALBUTEROL 0.5-2.5 (3) MG/3ML IN SOLN
3.0000 mL | Freq: Once | RESPIRATORY_TRACT | Status: AC
  Administered 2023-07-04: 3 mL via RESPIRATORY_TRACT

## 2023-07-04 MED ORDER — DEXAMETHASONE SODIUM PHOSPHATE 10 MG/ML IJ SOLN
10.0000 mg | Freq: Once | INTRAMUSCULAR | Status: AC
  Administered 2023-07-04: 10 mg via INTRAMUSCULAR

## 2023-07-04 MED ORDER — PROMETHAZINE-DM 6.25-15 MG/5ML PO SYRP: 5.0000 mL | ORAL_SOLUTION | Freq: Four times a day (QID) | ORAL | 0 refills | Status: DC | PRN

## 2023-07-04 MED ORDER — GUAIFENESIN ER 600 MG PO TB12: 600.0000 mg | ORAL_TABLET | Freq: Two times a day (BID) | ORAL | 0 refills | Status: DC

## 2023-07-04 NOTE — ED Provider Notes (Signed)
[facility]    CSN: [id number] Arrival date & time: 07/04/23  4098      History   Chief Complaint No chief complaint on file.   HPI Patrick Mathews is a 73 y.o. male.   Patient presenting today with 2-day history of productive cough, chest tightness, wheezing.  Denies fever, chills, abdominal pain, sore throat, nausea, vomiting, diarrhea.  History of asthma, has an albuterol inhaler but states he forgot to try using it.  So far trying Mucinex with minimal relief.    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   TTE 05/2021: 41 mm  CT 05/2021: 39 mm    Asthma    CAD (coronary artery disease)    Exertional dyspnea    GERD (gastroesophageal reflux disease)    Hyperlipidemia    Hypertension    Neuropathy    Primary localized osteoarthritis of left knee    Primary localized osteoarthritis of right knee 05/04/2020   Seasonal allergies     Patient Active Problem List   Diagnosis Date Noted   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   Preoperative cardiovascular examination 02/21/2023   Aortic insufficiency 02/21/2023   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   Essential hypertension 02/21/2023   History of lumbar fusion 08/09/2022   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   CAD (coronary artery disease) 08/16/2021   DOE (dyspnea on exertion) 08/16/2021   Tremor 04/27/2021   Gait abnormality 04/27/2021   Primary localized osteoarthritis of right knee 05/04/2020   Lumbar adjacent segment disease with spondylolisthesis 02/06/2020   Chest pressure 09/27/2019   Spinal stenosis of  lumbar region with neurogenic claudication 04/26/2018   Neck pain 06/23/2015   Morbid obesity (HCC) 05/08/2015   DJD (degenerative joint disease) of knee 07/14/2014   Acute upper respiratory infection 06/12/2014   Cough 06/12/2014   Primary localized osteoarthritis of left knee    Hyperlipidemia    GERD (gastroesophageal reflux disease)    Back pain    Dyspnea 05/07/2014   Multiple pulmonary nodules 05/07/2014   Thoracic radiculitis 08/19/2013    Past Surgical History:  Procedure Laterality Date   ANTERIOR CERVICAL DECOMP/DISCECTOMY FUSION  1999   Dr Charna Busman SURGERY     BUNIONECTOMY Right 2013   COLONOSCOPY N/A 11/06/2012   Procedure: COLONOSCOPY;  Surgeon: Dalia Heading, MD;  Location: [facility];  Service: Gastroenterology;  Laterality: N/A;   FOOT SURGERY Right 2013   bunionectomy   KNEE ARTHROSCOPY Bilateral 1990's   POSTERIOR LUMBAR FUSION  1985; 2009   Dr Jeral Fruit   TONSILLECTOMY  1950's   TOTAL HIP ARTHROPLASTY Right 12/07/2021   Procedure: TOTAL HIP ARTHROPLASTY ANTERIOR APPROACH;  Surgeon: Sheral Apley, MD;  Location: [facility];  Service: Orthopedics;  Laterality: Right;   TOTAL KNEE ARTHROPLASTY Left 07/14/2014   Procedure: LEFT TOTAL KNEE ARTHROPLASTY;  Surgeon: Nilda Simmer, MD;  Location: [facility];  Service: Orthopedics;  Laterality: Left;   TOTAL KNEE ARTHROPLASTY Right 05/11/2020   Procedure: TOTAL KNEE ARTHROPLASTY;  Surgeon: Salvatore Marvel, MD;  Location: [facility];  Service:  Orthopedics;  Laterality: Right;   WISDOM TOOTH EXTRACTION         Home Medications    Prior to Admission medications   Medication Sig Start Date End Date Taking? Authorizing Provider  guaiFENesin (MUCINEX) 600 MG 12 hr tablet Take 1 tablet (600 mg total) by mouth 2 (two) times daily. 07/04/23  Yes Particia Nearing, PA-C  promethazine-dextromethorphan (PROMETHAZINE-DM) 6.25-15 MG/5ML syrup Take 5 mLs by mouth 4 (four) times daily as needed. 07/04/23  Yes Particia Nearing, PA-C  acetaminophen (TYLENOL) 500 MG tablet Take 2 tablets (1,000 mg total) by mouth every 6 (six) hours as needed for mild pain or moderate pain. 12/08/21   Jenne Pane, PA-C  aspirin EC 81 MG tablet Take 81 mg by mouth daily. Start after you have your surgery    [provider]  calcium carbonate (TUMS - DOSED IN MG ELEMENTAL CALCIUM) 500 MG chewable tablet Chew 1-2 tablets by mouth daily as needed for indigestion or heartburn.    [provider]  cholecalciferol (VITAMIN D3) 25 MCG (1000 UNIT) tablet Take 1,000 Units by mouth 3 (three) times a week.    [provider]  cyclobenzaprine (FLEXERIL) 10 MG tablet Take 1 tablet (10 mg total) by mouth 3 (three) times daily as needed for muscle spasms. 04/27/23   Tressie Stalker, MD  docusate sodium (COLACE) 100 MG capsule Take 1 capsule (100 mg total) by mouth 2 (two) times daily. 04/27/23   Tressie Stalker, MD  gabapentin (NEURONTIN) 600 MG tablet Take 1,200 mg by mouth at bedtime.    [provider]  HYDROmorphone (DILAUDID) 2 MG tablet Take 1-2 tablets (2-4 mg total) by mouth every 4 (four) hours as needed for moderate pain (pain score 4-6). 04/27/23   Tressie Stalker, MD  losartan (COZAAR) 25 MG tablet Take 1 tablet (25 mg total) by mouth daily. Patient taking differently: Take 25 mg by mouth every evening. 01/11/23   Nahser, Deloris Ping, MD  ondansetron (ZOFRAN) 8 MG tablet Take 8 mg by mouth every 8 (eight) hours as needed for nausea or vomiting.    [provider]  pantoprazole (PROTONIX) 40 MG tablet Take 40 mg by mouth daily.    [provider]  polyethylene glycol (MIRALAX / GLYCOLAX) 17 g packet Take 17 g by mouth daily.    [provider]  sildenafil (VIAGRA) 100 MG tablet Take 100 mg by mouth daily as needed for erectile dysfunction.  04/21/20   [provider]  VENTOLIN HFA 108 (90 Base) MCG/ACT inhaler Inhale 1-2 puffs into the lungs every 6 (six) hours as  needed for wheezing or shortness of breath. 03/07/22   [provider]    Family History Family History  Problem Relation Age of Onset   CAD Mother    CVA Mother    Alzheimer's disease Mother    Dementia Father    Alzheimer's disease Father    Stroke Maternal Grandmother    Heart attack Maternal Grandfather    CAD Maternal Grandfather    Stroke Paternal Grandmother    Colon cancer Neg Hx    Colon polyps Neg Hx     Social History Social History   Tobacco Use   Smoking status: Former    Types: Cigars    Quit date: 2021    Years since quitting: 4.0   Smokeless tobacco: Never   Tobacco comments:    07/14/2014 smoked cigs on and off x 40 yrs- none since 2014,  but does smoke occ cigar-  Vaping Use   Vaping status: Never Used  Substance Use Topics   Alcohol use: Yes    Alcohol/week: 0.0 standard drinks of alcohol    Comment: occ   Drug use: No     Allergies   Sulfa antibiotics, Morphine and codeine, and Oxycodone hcl   Review of Systems Review of Systems Per HPI  Physical Exam Triage Vital Signs ED Triage Vitals  Encounter Vitals Group     BP 07/04/23 0849 128/77     Systolic BP Percentile --      Diastolic BP Percentile --      Pulse Rate 07/04/23 0849 69     Resp 07/04/23 0849 20     Temp 07/04/23 0849 97.6 F (36.4 C)     Temp Source 07/04/23 0849 Oral     SpO2 07/04/23 0849 95 %     Weight --      Height --      Head Circumference --      Peak Flow --      Pain Score 07/04/23 0850 3     Pain Loc --      Pain Education --      Exclude from Growth Chart --    No data found.  Updated Vital Signs BP 128/77 (BP Location: Right Arm)   Pulse 69   Temp 97.6 F (36.4 C) (Oral)   Resp 20   SpO2 (S) 99% Comment: after resp tx  Visual Acuity Right Eye Distance:   Left Eye Distance:   Bilateral Distance:    Right Eye Near:   Left Eye Near:    Bilateral Near:     Physical Exam Vitals and nursing note reviewed.  Constitutional:       Appearance: He is well-developed.  HENT:     Head: Atraumatic.     Right Ear: External ear normal.     Left Ear: External ear normal.     Nose: Rhinorrhea present.     Mouth/Throat:     Pharynx: Posterior oropharyngeal erythema present. No oropharyngeal exudate.  Eyes:     Conjunctiva/sclera: Conjunctivae normal.     Pupils: Pupils are equal, round, and reactive to light.  Cardiovascular:     Rate and Rhythm: Normal rate and regular rhythm.  Pulmonary:     Effort: Pulmonary effort is normal. No respiratory distress.     Breath sounds: Wheezing present. No rales.  Musculoskeletal:        General: Normal range of motion.     Cervical back: Normal range of motion and neck supple.  Lymphadenopathy:     Cervical: No cervical adenopathy.  Skin:    General: Skin is warm and dry.  Neurological:     Mental Status: He is alert and oriented to person, place, and time.  Psychiatric:        Behavior: Behavior normal.      UC Treatments / Results  Labs (all labs ordered are listed, but only abnormal results are displayed) Labs Reviewed  POC COVID19/FLU A&B COMBO    EKG   Radiology No results found.  Procedures Procedures (including critical care time)  Medications Ordered in UC Medications  ipratropium-albuterol (DUONEB) 0.5-2.5 (3) MG/3ML nebulizer solution 3 mL (3 mLs Nebulization Given 07/04/23 0935)  dexamethasone (DECADRON) injection 10 mg (10 mg Intramuscular Given 07/04/23 1003)    Initial Impression / Assessment and Plan / UC Course  I have reviewed the triage vital signs and  the nursing notes.  Pertinent labs & imaging results that were available during my care of the patient were reviewed by me and considered in my medical decision making (see chart for details).     Vital signs and exam very reassuring today.  Suspect viral respiratory infection causing asthma exacerbation.  COVID and flu testing negative.  DuoNeb treatment given in clinic with moderate  improvement in symptoms.  Will treat with IM Decadron, Phenergan DM, Mucinex, supportive over-the-counter medications and home care.  Return for worsening symptoms.  Final Clinical Impressions(s) / UC Diagnoses   Final diagnoses:  Viral URI with cough  Mild intermittent asthma with acute exacerbation   Discharge Instructions   None    ED Prescriptions     Medication Sig Dispense Auth. Provider   promethazine-dextromethorphan (PROMETHAZINE-DM) 6.25-15 MG/5ML syrup Take 5 mLs by mouth 4 (four) times daily as needed. 100 mL Particia Nearing, PA-C   guaiFENesin (MUCINEX) 600 MG 12 hr tablet Take 1 tablet (600 mg total) by mouth 2 (two) times daily. 20 tablet Particia Nearing, [place]      PDMP not reviewed this encounter.   Particia Nearing, [place] 07/04/23 1911

## 2023-07-04 NOTE — ED Triage Notes (Signed)
Productive Cough x 2 days has been taking mucinex without relief.

## 2023-07-05 ENCOUNTER — Encounter (HOSPITAL_COMMUNITY): Payer: Medicare Other

## 2023-07-07 ENCOUNTER — Encounter (HOSPITAL_COMMUNITY): Payer: Medicare Other

## 2023-07-12 ENCOUNTER — Ambulatory Visit (HOSPITAL_COMMUNITY): Payer: PPO

## 2023-07-12 DIAGNOSIS — M792 Neuralgia and neuritis, unspecified: Secondary | ICD-10-CM | POA: Diagnosis not present

## 2023-07-12 DIAGNOSIS — M6281 Muscle weakness (generalized): Secondary | ICD-10-CM

## 2023-07-12 DIAGNOSIS — R262 Difficulty in walking, not elsewhere classified: Secondary | ICD-10-CM

## 2023-07-12 DIAGNOSIS — R2689 Other abnormalities of gait and mobility: Secondary | ICD-10-CM

## 2023-07-12 NOTE — Therapy (Signed)
OUTPATIENT PHYSICAL THERAPY THORACOLUMBAR TREATMENT   Patient Name: Patrick Mathews MRN: [id number] DOB:1951/01/28, 73 y.o., male Today's Date: 07/12/2023  END OF SESSION:   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   TTE 05/2021: 41 mm  CT 05/2021: 39 mm    Asthma    CAD (coronary artery disease)    Exertional dyspnea    GERD (gastroesophageal reflux disease)    Hyperlipidemia    Hypertension    Neuropathy    Primary localized osteoarthritis of left knee    Primary localized osteoarthritis of right knee 05/04/2020   Seasonal allergies    Past Surgical History:  Procedure Laterality Date   ANTERIOR CERVICAL DECOMP/DISCECTOMY FUSION  1999   Dr Charna Busman SURGERY     BUNIONECTOMY Right 2013   COLONOSCOPY N/A 11/06/2012   Procedure: COLONOSCOPY;  Surgeon: Dalia Heading, MD;  Location: [facility];  Service: Gastroenterology;  Laterality: N/A;   FOOT SURGERY Right 2013   bunionectomy   KNEE ARTHROSCOPY Bilateral 1990's   POSTERIOR LUMBAR FUSION  1985; 2009   Dr Jeral Fruit   TONSILLECTOMY  1950's   TOTAL HIP ARTHROPLASTY Right 12/07/2021   Procedure: TOTAL HIP ARTHROPLASTY ANTERIOR APPROACH;  Surgeon: Sheral Apley, MD;  Location: [facility];  Service: Orthopedics;  Laterality: Right;   TOTAL KNEE ARTHROPLASTY Left 07/14/2014   Procedure: LEFT TOTAL KNEE ARTHROPLASTY;  Surgeon: Nilda Simmer, MD;  Location: [facility];  Service: Orthopedics;  Laterality: Left;   TOTAL KNEE ARTHROPLASTY Right 05/11/2020   Procedure: TOTAL KNEE ARTHROPLASTY;  Surgeon: Salvatore Marvel, MD;  Location: [facility];  Service:  Orthopedics;  Laterality: Right;   WISDOM TOOTH EXTRACTION     Patient Active Problem List   Diagnosis Date Noted   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   Preoperative cardiovascular examination 02/21/2023   Aortic insufficiency 02/21/2023   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   Essential hypertension 02/21/2023   History of lumbar fusion 08/09/2022   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   CAD (coronary artery disease) 08/16/2021   DOE (dyspnea on exertion) 08/16/2021   Tremor 04/27/2021   Gait abnormality 04/27/2021   Primary localized osteoarthritis of right knee 05/04/2020   Lumbar adjacent segment disease with spondylolisthesis 02/06/2020   Chest pressure 09/27/2019   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   Neck pain 06/23/2015   Morbid obesity (HCC) 05/08/2015   DJD (degenerative joint disease) of knee 07/14/2014   Acute upper respiratory infection 06/12/2014   Cough 06/12/2014   Primary localized osteoarthritis of left knee    Hyperlipidemia    GERD (gastroesophageal reflux disease)    Back pain    Dyspnea 05/07/2014   Multiple pulmonary nodules 05/07/2014   Thoracic radiculitis 08/19/2013    PCP: Assunta Found, MD  REFERRING PROVIDER: Tressie Stalker, MD  REFERRING DIAG: M43.16 (ICD-10-CM) - Spondylolisthesis, lumbar region,  POSTERIOR LUMBAR INTERBODY FUSION-LUMBAR FIVE-SACRAL ONE, WITH INTERBODY PROSTHESIS AND POSTERIOR INSTRUMENTATION FROM LUMBAR FIVE TO ILIUM AND EXPLORATION OF FUSION LUMBAR TWO-LUMBAR FIVE (04/26/23)    Rationale for Evaluation and Treatment: Rehabilitation  THERAPY DIAG:  No diagnosis found.  ONSET DATE: POSTERIOR LUMBAR INTERBODY FUSION-LUMBAR FIVE-SACRAL ONE, WITH INTERBODY PROSTHESIS AND POSTERIOR INSTRUMENTATION FROM LUMBAR FIVE TO ILIUM AND EXPLORATION OF FUSION LUMBAR TWO-LUMBAR FIVE (04/26/23)  SUBJECTIVE:                                                                                                                                                                                            SUBJECTIVE STATEMENT: Patient was not able to attend to his PT session because he has been coughing without fever but is feeling better. Patient denies any pain at the moment. Patient was unable to do his HEP because he has been sick.  EVAL: Arrives to the clinic with some foot pain when he walks. However, denies pain at the moment even on the surgical area. Patient also reports that the L side feels weak and has been pre-existing before the surgery. Patient is also numb on the L knee down. Patient also reports that he tends to drag the L LE and he can trip. Patient had lumbar fusion on 04/26/23 because of disc degeneration. Patient was only admitted overnight and had PT the following day at the hospital and then was sent home. Patient had PT in the past which did not help. Patient is now 5 weeks post-op.   PERTINENT HISTORY:  R THR 2023, L TKR 2016, R TKR 2021, hx of back surgery  PAIN:  Are you having pain? Yes: NPRS scale: 5 Pain location: inner side of the L foot Pain description: throbbing, tingling Aggravating factors: walking around 0.5 miles, at night Relieving factors: gabapentin  PRECAUTIONS: Other: lumbar fusion protocol, back brace till the end of January 2025 (can be taken off when sitting)  RED FLAGS: None   WEIGHT BEARING RESTRICTIONS: No  FALLS:  Has patient fallen in last 6 months? No  LIVING ENVIRONMENT: Lives with: lives with their spouse Lives in: House/apartment Stairs: Yes: External: 5 steps; on left going up Has following equipment at home: Single point cane, Quad cane small base, Walker - 4 wheeled, and Ramped entry  OCCUPATION: retired  PLOF: Independent and Independent with basic ADLs  PATIENT GOALS: "I would like to get back my strength on the left leg"  NEXT MD VISIT: March 2025  OBJECTIVE:  Note: Objective measures were  completed at Evaluation unless otherwise noted.  DIAGNOSTIC FINDINGS:  04/26/23 EXAM: LUMBAR SPINE - 2-3 VIEW; DG C-ARM 1-60 MIN-NO REPORT   Radiation exposure index: 24.34 mGy.   COMPARISON:  Nov 02, 2021.   FINDINGS: Four intraoperative fluoroscopic images were obtained the lower lumbar spine. These images demonstrate surgical  interbody fusion of L5-S1, as well as bilateral intrapedicular screws at S1.   IMPRESSION: Fluoroscopic guidance provided during lower lumbar surgery as noted above.    PATIENT SURVEYS:  FOTO [id number]  COGNITION: Overall cognitive status: Within functional limits for tasks assessed     SENSATION: Light touch: Impaired on L LE  MUSCLE LENGTH: Gastrocsoleus: moderate restriction on the L, mild restriction on R  POSTURE: rounded shoulders and forward head True leg length: R LE = 89 cm, L LE = 90.5 cm  LUMBAR ROM:   AROM Eval (Not tested)  Flexion   Extension   Right lateral flexion   Left lateral flexion   Right rotation   Left rotation    (Blank rows = not tested)  LOWER EXTREMITY ROM:     Active  Right eval Left eval  Hip flexion [facility]  Hip extension    Hip abduction    Hip adduction    Hip internal rotation    Hip external rotation    Knee flexion [facility] WFL  Knee extension [facility]  Ankle dorsiflexion [facility] WFL  Ankle plantarflexion [facility] WFL  Ankle inversion    Ankle eversion     (Blank rows = not tested)  LOWER EXTREMITY MMT:    MMT Right eval Left eval  Hip flexion 4- 4-  Hip extension    Hip abduction 4- 4-  Hip adduction    Hip internal rotation    Hip external rotation    Knee flexion 4+ 4-  Knee extension 4+ 4-  Ankle dorsiflexion 4+ 3-  Ankle plantarflexion 4+ 3+  Ankle inversion    Ankle eversion     (Blank rows = not tested)  FUNCTIONAL TESTS:  5 times sit to stand: to be determined 7-8th week post op 2 minute walk test: 410 ft Tinetti POMA: 18 (balance + gait)  GAIT: Distance walked: 410  ft Assistive device utilized: None Level of assistance: Complete Independence Comments: done with , noticeable foot slap on initial contact  TREATMENT DATE:  07/12/23 UBE, level 1, forward, 5' Seated hamstring stretch x 30" x 3 Seated piriformis stretch x 30" x 3 Seated hip flexor stretch x 30" x 2 Gastrocnemius slant board x 30" x 2 Bridging x 3" x 10 x 2 Dead bug x 10 x 2 Seated marches, neutral spine x 10 x 2 Seated alternating knee ext, neutral spine x 10 x 2 Seated alternating UE lifts, neutral spine x 10 x 2 Seated hip abd, RTB x 10 x 2  06/29/23 Seated hamstring stretch x 30" x 3 Seated piriformis stretch x 30" x 3 Seated hip flexor stretch x 30" x 2 Gastrocnemius slant board x 30" x 3 Hooklying clamshells, RTB x 10 x 2 Supine, alternating shoulder flexion, with draw in x 10 x 2 Supine marches, with draw in, x 10 x 2 Supine alternating heel slides, with draw in, x 10 x 2 Supine bent knee fall outs with draw in x 10 x 2  06/27/23 Reviewed goals Seated hamstring stretch x 30" x 3 Seated piriformis stretch x 30" x 3 Seated calf stretch with a strap x 30" x 2 Supine marches, with draw in, x 10 x 2 Supine heel slides, with draw in, x 10 x 2 Supine hip abduction, with draw in x 10 x 2 Education on log rolling - patient  gave excellent verbal understanding  06/01/23 Evaluation and patient education done  PATIENT EDUCATION:  Education details: Educated on the pathoanatomy of lumbar fusion. Educated on the goals and course of rehab.  Person educated: Patient Education method: Explanation Education comprehension: verbalized understanding  HOME EXERCISE PROGRAM: Access Code: [email or web address] URL: [email or web address] 07/12/2023 - Supine Bridge  - 1 x daily - 5-7 x weekly - 2 sets - 10 reps - 3 hold - Dead Bug  - 1 x daily - 5-7 x  weekly - 2 sets - 10 reps - Seated March  - 1 x daily - 5-7 x weekly - 2 sets - 10 reps  06/29/2023 - Seated Hip Flexor Stretch  - 1 x daily - 5-7 x weekly - 3 reps - 30 hold - Hooklying Clamshell with Resistance  - 1 x daily - 5-7 x weekly - 2 sets - 10 reps  Date: 06/27/2023 Prepared by: Krystal Clark  Exercises - Seated Piriformis Stretch  - 1 x daily - 5-7 x weekly - 3 reps - 30 hold - Seated Hamstring Stretch  - 1 x daily - 5-7 x weekly - 3 reps - 30 hold - Seated Calf Stretch with Strap  - 1 x daily - 5-7 x weekly - 3 reps - 30 hold - Supine March  - 1 x daily - 5-7 x weekly - 2 sets - 10 reps - Supine Heel Slide  - 1 x daily - 5-7 x weekly - 2 sets - 10 reps - Supine Hip Abduction  - 1 x daily - 5-7 x weekly - 2 sets - 10 reps  Patient Education - Logroll With Lumbar Precautions  ASSESSMENT:  CLINICAL IMPRESSION: Patient is 10 weeks post-op. Interventions today were geared towards LE flexibility and core strengthening. Tolerated all activities without worsening of symptoms except when patient reported of "good pain" in alt knee ext. Demonstrated appropriate levels of fatigue. Pacing of activities is still slow. Provided slight amount of cueing to ensure correct execution of activity with good carry-over. To date, skilled PT is required to address the impairments and improve function.  EVAL: Patient is a 73 y.o. male who was seen today for physical therapy evaluation and treatment for s/p lumbar fusion. Patient's condition is further defined by difficulty with walking due to pain, weakness, impaired balance, and decreased soft tissue extensibility. Skilled PT is required to address the impairments and functional limitations listed below. Patient states that he will be out of town for the next 2-3 weeks and maybe back from PT around 06/28/2023. Patient was advised to continue his HEP provided by his PT from the hospital. Patient gave excellent understanding.  OBJECTIVE IMPAIRMENTS:  Abnormal gait, decreased activity tolerance, decreased balance, difficulty walking, decreased strength, impaired flexibility, and pain.   ACTIVITY LIMITATIONS: carrying, lifting, bending, sitting, standing, squatting, stairs, and transfers  PARTICIPATION LIMITATIONS: meal prep, cleaning, laundry, driving, shopping, community activity, and yard work  PERSONAL FACTORS: Age, Past/current experiences, and 3+ comorbidities: R THR 2023, L TKR 2016, R TKR 2021, hx of back surgery  are also affecting patient's functional outcome.   REHAB POTENTIAL: Fair    CLINICAL DECISION MAKING: Evolving/moderate complexity  EVALUATION COMPLEXITY: Moderate   GOALS: Goals reviewed with patient? Yes  SHORT TERM GOALS: Target date: On the 4th PT visit  Pt will demonstrate indep in HEP to facilitate carry-over of skilled services and improve functional outcomes Goal status: INITIAL  LONG TERM GOALS: Target date: On the 16th PT visit  Pt will increase by at least 40 ft in order to demonstrate clinically  significant improvement in community ambulation Baseline: 410 ft Goal status: INITIAL  2.  Patient will demonstrate increase in Tinetti Score by 2 points in order to demonstrate clinically significant improvement in balance and decreased risk for falls  Baseline: 18 Goal status: INITIAL  3.  Pt will demonstrate increase in LE strength to 4-/5 to facilitate ease and safety in ambulation Baseline: 3-/5 Goal status: INITIAL  4.  Pt will decrease 5TSTS by at least 3 seconds in order to demonstrate clinically significant improvement in LE strength  Baseline: to be determined Goal status: INITIAL  5.  Pt will increase FOTO to at least 59 in order to demonstrate significant improvement in function related to  ADLs and ambulation Baseline: 49.8 Goal status: INITIAL  PLAN:  PT FREQUENCY: 2x/week  PT DURATION: 8 weeks  PLANNED INTERVENTIONS: 97164- PT Re-evaluation, 97110-Therapeutic exercises,  97530- Therapeutic activity, 97112- Neuromuscular re-education, 97535- Self Care, 16109- Manual therapy, [id number]- Gait training, Patient/Family education, Cryotherapy, and Moist heat.  PLAN FOR NEXT SESSION: Continue POC and may progress as tolerated with emphasis on flexibility, core and LE strengthening per protocol.   Tish Frederickson. Chandlor Noecker, PT, DPT, OCS Board-Certified Clinical Specialist in Orthopedic PT PT Compact Privilege # ([place]): [id number] T 07/12/2023, 12:58 PM

## 2023-07-14 ENCOUNTER — Ambulatory Visit (HOSPITAL_COMMUNITY): Payer: PPO

## 2023-07-14 DIAGNOSIS — R2689 Other abnormalities of gait and mobility: Secondary | ICD-10-CM

## 2023-07-14 DIAGNOSIS — M792 Neuralgia and neuritis, unspecified: Secondary | ICD-10-CM | POA: Diagnosis not present

## 2023-07-14 DIAGNOSIS — M6281 Muscle weakness (generalized): Secondary | ICD-10-CM

## 2023-07-14 DIAGNOSIS — R262 Difficulty in walking, not elsewhere classified: Secondary | ICD-10-CM

## 2023-07-14 NOTE — Therapy (Signed)
OUTPATIENT PHYSICAL THERAPY THORACOLUMBAR TREATMENT   Patient Name: Patrick Mathews MRN: [id number] DOB:02-Mar-1951, 73 y.o., male Today's Date: 07/14/2023  END OF SESSION:  PT End of Session - 07/14/23 1308     Visit Number 5    Number of Visits 16    Date for PT Re-Evaluation 08/24/23    Authorization Type Medicare Part A/B    Progress Note Due on Visit 8    PT Start Time 1300    PT Stop Time 1340    PT Time Calculation (min) 40 min    Activity Tolerance Patient tolerated treatment well    Behavior During Therapy [facility] for tasks assessed/performed             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   TTE 05/2021: 41 mm  CT 05/2021: 39 mm    Asthma    CAD (coronary artery disease)    Exertional dyspnea    GERD (gastroesophageal reflux disease)    Hyperlipidemia    Hypertension    Neuropathy    Primary localized osteoarthritis of left knee    Primary localized osteoarthritis of right knee 05/04/2020   Seasonal allergies    Past Surgical History:  Procedure Laterality Date   ANTERIOR CERVICAL DECOMP/DISCECTOMY FUSION  1999   Dr Charna Busman SURGERY     BUNIONECTOMY Right 2013   COLONOSCOPY N/A 11/06/2012   Procedure: COLONOSCOPY;  Surgeon: Dalia Heading, MD;  Location: [facility];  Service: Gastroenterology;  Laterality: N/A;   FOOT SURGERY Right 2013   bunionectomy   KNEE ARTHROSCOPY Bilateral 1990's   POSTERIOR LUMBAR FUSION  1985; 2009   Dr Jeral Fruit   TONSILLECTOMY  1950's   TOTAL HIP ARTHROPLASTY Right 12/07/2021   Procedure: TOTAL HIP ARTHROPLASTY ANTERIOR APPROACH;  Surgeon: Sheral Apley, MD;  Location: [facility];  Service: Orthopedics;  Laterality: Right;   TOTAL KNEE ARTHROPLASTY Left 07/14/2014   Procedure: LEFT TOTAL KNEE ARTHROPLASTY;  Surgeon: Nilda Simmer, MD;  Location: [facility] OR;  Service: Orthopedics;  Laterality: Left;   TOTAL KNEE ARTHROPLASTY Right 05/11/2020   Procedure: TOTAL KNEE ARTHROPLASTY;  Surgeon: Salvatore Marvel, MD;  Location: [facility];  Service: Orthopedics;  Laterality: Right;   WISDOM TOOTH EXTRACTION     Patient Active Problem List   Diagnosis Date Noted   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   Preoperative cardiovascular examination 02/21/2023   Aortic insufficiency 02/21/2023   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   Essential hypertension 02/21/2023   History of lumbar fusion 08/09/2022   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   CAD (coronary artery disease) 08/16/2021   DOE (dyspnea on exertion) 08/16/2021   Tremor 04/27/2021   Gait abnormality 04/27/2021   Primary localized osteoarthritis of right knee 05/04/2020   Lumbar adjacent segment disease with spondylolisthesis 02/06/2020   Chest pressure 09/27/2019   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   Neck pain 06/23/2015   Morbid obesity (HCC) 05/08/2015   DJD (degenerative joint disease) of knee 07/14/2014   Acute upper respiratory infection 06/12/2014  Cough 06/12/2014   Primary localized osteoarthritis of left knee    Hyperlipidemia    GERD (gastroesophageal reflux disease)    Back pain    Dyspnea 05/07/2014   Multiple pulmonary nodules 05/07/2014   Thoracic radiculitis 08/19/2013    PCP: Assunta Found, MD  REFERRING PROVIDER: Tressie Stalker, MD  REFERRING DIAG: M43.16 (ICD-10-CM) - Spondylolisthesis, lumbar region,  POSTERIOR LUMBAR INTERBODY FUSION-LUMBAR FIVE-SACRAL ONE, WITH INTERBODY PROSTHESIS AND POSTERIOR INSTRUMENTATION FROM LUMBAR FIVE TO ILIUM AND EXPLORATION OF FUSION LUMBAR TWO-LUMBAR FIVE  (04/26/23)    Rationale for Evaluation and Treatment: Rehabilitation  THERAPY DIAG:  Difficulty in walking, not elsewhere classified  Other abnormalities of gait and mobility  Muscle weakness (generalized)  ONSET DATE: POSTERIOR LUMBAR INTERBODY FUSION-LUMBAR FIVE-SACRAL ONE, WITH INTERBODY PROSTHESIS AND POSTERIOR INSTRUMENTATION FROM LUMBAR FIVE TO ILIUM AND EXPLORATION OF FUSION LUMBAR TWO-LUMBAR FIVE (04/26/23)  SUBJECTIVE:                                                                                                                                                                                           SUBJECTIVE STATEMENT: Doing well today and denies pain. Patient reports that he was okay after the last session and has been doing his HEP without issues. Patient has a wound on his L wrist and states that he may have hit something which scraped his skin  EVAL: Arrives to the clinic with some foot pain when he walks. However, denies pain at the moment even on the surgical area. Patient also reports that the L side feels weak and has been pre-existing before the surgery. Patient is also numb on the L knee down. Patient also reports that he tends to drag the L LE and he can trip. Patient had lumbar fusion on 04/26/23 because of disc degeneration. Patient was only admitted overnight and had PT the following day at the hospital and then was sent home. Patient had PT in the past which did not help. Patient is now 5 weeks post-op.   PERTINENT HISTORY:  R THR 2023, L TKR 2016, R TKR 2021, hx of back surgery  PAIN:  Are you having pain? Yes: NPRS scale: 5 Pain location: inner side of the L foot Pain description: throbbing, tingling Aggravating factors: walking around 0.5 miles, at night Relieving factors: gabapentin  PRECAUTIONS: Other: lumbar fusion protocol, back brace till the end of January 2025 (can be taken off when sitting)  RED FLAGS: None   WEIGHT BEARING RESTRICTIONS:  No  FALLS:  Has patient fallen in last 6 months? No  LIVING ENVIRONMENT: Lives with: lives with their spouse Lives in: House/apartment Stairs: Yes:  External: 5 steps; on left going up Has following equipment at home: Single point cane, Quad cane small base, Walker - 4 wheeled, and Ramped entry  OCCUPATION: retired  PLOF: Independent and Independent with basic ADLs  PATIENT GOALS: "I would like to get back my strength on the left leg"  NEXT MD VISIT: March 2025  OBJECTIVE:  Note: Objective measures were completed at Evaluation unless otherwise noted.  DIAGNOSTIC FINDINGS:  04/26/23 EXAM: LUMBAR SPINE - 2-3 VIEW; DG C-ARM 1-60 MIN-NO REPORT   Radiation exposure index: 24.34 mGy.   COMPARISON:  Nov 02, 2021.   FINDINGS: Four intraoperative fluoroscopic images were obtained the lower lumbar spine. These images demonstrate surgical interbody fusion of L5-S1, as well as bilateral intrapedicular screws at S1.   IMPRESSION: Fluoroscopic guidance provided during lower lumbar surgery as noted above.    PATIENT SURVEYS:  FOTO [id number]  COGNITION: Overall cognitive status: Within functional limits for tasks assessed     SENSATION: Light touch: Impaired on L LE  MUSCLE LENGTH: Gastrocsoleus: moderate restriction on the L, mild restriction on R  POSTURE: rounded shoulders and forward head True leg length: R LE = 89 cm, L LE = 90.5 cm  LUMBAR ROM:   AROM Eval (Not tested)  Flexion   Extension   Right lateral flexion   Left lateral flexion   Right rotation   Left rotation    (Blank rows = not tested)  LOWER EXTREMITY ROM:     Active  Right eval Left eval  Hip flexion [facility]  Hip extension    Hip abduction    Hip adduction    Hip internal rotation    Hip external rotation    Knee flexion [facility] WFL  Knee extension [facility]  Ankle dorsiflexion [facility] WFL  Ankle plantarflexion [facility] WFL  Ankle inversion    Ankle eversion     (Blank rows = not tested)  LOWER  EXTREMITY MMT:    MMT Right eval Left eval  Hip flexion 4- 4-  Hip extension    Hip abduction 4- 4-  Hip adduction    Hip internal rotation    Hip external rotation    Knee flexion 4+ 4-  Knee extension 4+ 4-  Ankle dorsiflexion 4+ 3-  Ankle plantarflexion 4+ 3+  Ankle inversion    Ankle eversion     (Blank rows = not tested)  FUNCTIONAL TESTS:  5 times sit to stand: to be determined 7-8th week post op 2 minute walk test: 410 ft Tinetti POMA: 18 (balance + gait)  GAIT: Distance walked: 410 ft Assistive device utilized: None Level of assistance: Complete Independence Comments: done with , noticeable foot slap on initial contact  TREATMENT DATE:  07/14/23 NuStep, seat 11, level 1, seat  5' Seated hamstring stretch x 30" x 3 Seated piriformis stretch x 30" x 3 Seated hip flexor stretch x 30" x 2 Gastrocnemius slant board x 30" x 2 Bridging x 3" x 10 x 2 Dead bug x 10 x 2 Seated abdominal press, neutral x 3" x 10 x 2 Sit-to-stand, standard x 10  Mini squats x 3" x 10 x 2 Standing hip abd, ext, YTB x 10 x 2  07/12/23 UBE, level 1, forward, 5' Seated hamstring stretch x 30" x 3 Seated piriformis stretch x 30" x 3 Seated hip flexor stretch x 30" x 2 Gastrocnemius slant board x 30" x 2 Bridging x 3" x 10 x 2 Dead bug x 10 x 2  Seated marches, neutral spine x 10 x 2 Seated alternating knee ext, neutral spine x 10 x 2 Seated alternating UE lifts, neutral spine x 10 x 2 Seated hip abd, RTB x 10 x 2  06/29/23 Seated hamstring stretch x 30" x 3 Seated piriformis stretch x 30" x 3 Seated hip flexor stretch x 30" x 2 Gastrocnemius slant board x 30" x 3 Hooklying clamshells, RTB x 10 x 2 Supine, alternating shoulder flexion, with draw in x 10 x 2 Supine marches, with draw in, x 10 x 2 Supine alternating heel slides, with draw in, x 10 x 2 Supine bent knee fall outs with draw in x 10 x 2  06/27/23 Reviewed goals Seated hamstring stretch x 30" x 3 Seated piriformis  stretch x 30" x 3 Seated calf stretch with a strap x 30" x 2 Supine marches, with draw in, x 10 x 2 Supine heel slides, with draw in, x 10 x 2 Supine hip abduction, with draw in x 10 x 2 Education on log rolling - patient  gave excellent verbal understanding  06/01/23 Evaluation and patient education done                                                                                                                                 PATIENT EDUCATION:  Education details: Educated on the pathoanatomy of lumbar fusion. Educated on the goals and course of rehab.  Person educated: Patient Education method: Explanation Education comprehension: verbalized understanding  HOME EXERCISE PROGRAM: Access Code: [email or web address] URL: [email or web address] 07/14/2023 - Mini Squat with Counter Support  - 1 x daily - 5-7 x weekly - 2 sets - 10 reps - 3 hold - Standing Hip Extension with Resistance at Ankles and Counter Support  - 1 x daily - 5-7 x weekly - 2 sets - 10 reps  07/12/2023 - Supine Bridge  - 1 x daily - 5-7 x weekly - 2 sets - 10 reps - 3 hold - Dead Bug  - 1 x daily - 5-7 x weekly - 2 sets - 10 reps - Seated March  - 1 x daily - 5-7 x weekly - 2 sets - 10 reps  06/29/2023 - Seated Hip Flexor Stretch  - 1 x daily - 5-7 x weekly - 3 reps - 30 hold - Hooklying Clamshell with Resistance  - 1 x daily - 5-7 x weekly - 2 sets - 10 reps  Date: 06/27/2023 Prepared by: Krystal Clark  Exercises - Seated Piriformis Stretch  - 1 x daily - 5-7 x weekly - 3 reps - 30 hold - Seated Hamstring Stretch  - 1 x daily - 5-7 x weekly - 3 reps - 30 hold - Seated Calf Stretch with Strap  - 1 x daily - 5-7 x weekly - 3 reps - 30 hold - Supine March  - 1 x daily - 5-7 x weekly - 2 sets - 10 reps -  Supine Heel Slide  - 1 x daily - 5-7 x weekly - 2 sets - 10 reps - Supine Hip Abduction  - 1 x daily - 5-7 x weekly - 2 sets - 10 reps  Patient Education - Logroll With Lumbar  Precautions  ASSESSMENT:  CLINICAL IMPRESSION: Patient is 10 weeks post-op. Interventions today were geared towards LE flexibility and core strengthening. Tolerated all activities without worsening of symptoms except when patient reported pain = 2/10 with hip abd. Demonstrated appropriate levels of fatigue. Provided slight amount of cueing to ensure correct execution of activity with good carry-over. To date, skilled PT is required to address the impairments and improve function.  EVAL: Patient is a 73 y.o. male who was seen today for physical therapy evaluation and treatment for s/p lumbar fusion. Patient's condition is further defined by difficulty with walking due to pain, weakness, impaired balance, and decreased soft tissue extensibility. Skilled PT is required to address the impairments and functional limitations listed below. Patient states that he will be out of town for the next 2-3 weeks and maybe back from PT around 06/28/2023. Patient was advised to continue his HEP provided by his PT from the hospital. Patient gave excellent understanding.  OBJECTIVE IMPAIRMENTS: Abnormal gait, decreased activity tolerance, decreased balance, difficulty walking, decreased strength, impaired flexibility, and pain.   ACTIVITY LIMITATIONS: carrying, lifting, bending, sitting, standing, squatting, stairs, and transfers  PARTICIPATION LIMITATIONS: meal prep, cleaning, laundry, driving, shopping, community activity, and yard work  PERSONAL FACTORS: Age, Past/current experiences, and 3+ comorbidities: R THR 2023, L TKR 2016, R TKR 2021, hx of back surgery  are also affecting patient's functional outcome.   REHAB POTENTIAL: Fair    CLINICAL DECISION MAKING: Evolving/moderate complexity  EVALUATION COMPLEXITY: Moderate   GOALS: Goals reviewed with patient? Yes  SHORT TERM GOALS: Target date: On the 4th PT visit  Pt will demonstrate indep in HEP to facilitate carry-over of skilled services and improve  functional outcomes Goal status: INITIAL  LONG TERM GOALS: Target date: On the 16th PT visit  Pt will increase by at least 40 ft in order to demonstrate clinically significant improvement in community ambulation Baseline: 410 ft Goal status: INITIAL  2.  Patient will demonstrate increase in Tinetti Score by 2 points in order to demonstrate clinically significant improvement in balance and decreased risk for falls  Baseline: 18 Goal status: INITIAL  3.  Pt will demonstrate increase in LE strength to 4-/5 to facilitate ease and safety in ambulation Baseline: 3-/5 Goal status: INITIAL  4.  Pt will decrease 5TSTS by at least 3 seconds in order to demonstrate clinically significant improvement in LE strength  Baseline: to be determined Goal status: INITIAL  5.  Pt will increase FOTO to at least 59 in order to demonstrate significant improvement in function related to  ADLs and ambulation Baseline: 49.8 Goal status: INITIAL  PLAN:  PT FREQUENCY: 2x/week  PT DURATION: 8 weeks  PLANNED INTERVENTIONS: 97164- PT Re-evaluation, 97110-Therapeutic exercises, 97530- Therapeutic activity, 97112- Neuromuscular re-education, 97535- Self Care, 16109- Manual therapy, [id number]- Gait training, Patient/Family education, Cryotherapy, and Moist heat.  PLAN FOR NEXT SESSION: Continue POC and may progress as tolerated with emphasis on flexibility, core and LE strengthening per protocol.   Tish Frederickson. Fairley Copher, PT, DPT, OCS Board-Certified Clinical Specialist in Orthopedic PT PT Compact Privilege # (Daniel): [id number] T 07/14/2023, 1:09 PM

## 2023-07-19 ENCOUNTER — Ambulatory Visit (HOSPITAL_COMMUNITY): Payer: PPO | Attending: Neurosurgery

## 2023-07-19 DIAGNOSIS — M6281 Muscle weakness (generalized): Secondary | ICD-10-CM | POA: Diagnosis not present

## 2023-07-19 DIAGNOSIS — R29898 Other symptoms and signs involving the musculoskeletal system: Secondary | ICD-10-CM | POA: Diagnosis not present

## 2023-07-19 DIAGNOSIS — M47816 Spondylosis without myelopathy or radiculopathy, lumbar region: Secondary | ICD-10-CM | POA: Diagnosis not present

## 2023-07-19 DIAGNOSIS — R262 Difficulty in walking, not elsewhere classified: Secondary | ICD-10-CM | POA: Diagnosis not present

## 2023-07-19 DIAGNOSIS — R2689 Other abnormalities of gait and mobility: Secondary | ICD-10-CM | POA: Diagnosis not present

## 2023-07-19 NOTE — Therapy (Signed)
 OUTPATIENT PHYSICAL THERAPY THORACOLUMBAR TREATMENT   Patient Name: Patrick Mathews MRN: [id number] DOB:1950-07-14, 73 y.o., male Today's Date: 07/19/2023  END OF SESSION:  PT End of Session - 07/19/23 1255     Visit Number 6    Number of Visits 16    Authorization Type Medicare Part A/B    Progress Note Due on Visit 8    PT Start Time 1300    PT Stop Time 1340    PT Time Calculation (min) 40 min    Activity Tolerance Patient tolerated treatment well    Behavior During Therapy WFL for tasks assessed/performed             Past Medical History:  Diagnosis Date   Adjacent segment disease with spinal stenosis    Aortic insufficiency 02/21/2023   TTE 05/25/2021: EF 55-60, no RWMA, GLS -17.9, normal RVSF, mild LAE, trivial MR, mild to moderate AI, AV sclerosis, mild dilation of aortic root (38 mm), mild dilation of ascending aorta (41 mm), RAP 3    Arthritis    knees; back (07/14/2014)   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   TTE 05/2021: 41 mm  CT 05/2021: 39 mm    Asthma    CAD (coronary artery disease)    Exertional dyspnea    GERD (gastroesophageal reflux disease)    Hyperlipidemia    Hypertension    Neuropathy    Primary localized osteoarthritis of left knee    Primary localized osteoarthritis of right knee 05/04/2020   Seasonal allergies    Past Surgical History:  Procedure Laterality Date   ANTERIOR CERVICAL DECOMP/DISCECTOMY FUSION  1999   Dr Leeann SANES SURGERY     BUNIONECTOMY Right 2013   COLONOSCOPY N/A 11/06/2012   Procedure: COLONOSCOPY;  Surgeon: Oneil DELENA Budge, MD;  Location: [facility];  Service: Gastroenterology;  Laterality: N/A;   FOOT SURGERY Right 2013   bunionectomy   KNEE ARTHROSCOPY Bilateral 1990's   POSTERIOR LUMBAR FUSION  1985; 2009   Dr Leeann   TONSILLECTOMY  1950's   TOTAL HIP ARTHROPLASTY Right 12/07/2021   Procedure: TOTAL HIP ARTHROPLASTY ANTERIOR APPROACH;  Surgeon: Beverley Evalene BIRCH, MD;  Location: [facility];   Service: Orthopedics;  Laterality: Right;   TOTAL KNEE ARTHROPLASTY Left 07/14/2014   Procedure: LEFT TOTAL KNEE ARTHROPLASTY;  Surgeon: Lamar DELENA Millman, MD;  Location: [facility];  Service: Orthopedics;  Laterality: Left;   TOTAL KNEE ARTHROPLASTY Right 05/11/2020   Procedure: TOTAL KNEE ARTHROPLASTY;  Surgeon: Millman Lamar, MD;  Location: [facility];  Service: Orthopedics;  Laterality: Right;   WISDOM TOOTH EXTRACTION     Patient Active Problem List   Diagnosis Date Noted   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   Preoperative cardiovascular examination 02/21/2023   Aortic insufficiency 02/21/2023   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   Essential hypertension 02/21/2023   History of lumbar fusion 08/09/2022   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   CAD (coronary artery disease) 08/16/2021   DOE (dyspnea on exertion) 08/16/2021   Tremor 04/27/2021   Gait abnormality 04/27/2021   Primary localized osteoarthritis of right knee 05/04/2020   Lumbar adjacent segment disease with spondylolisthesis 02/06/2020   Chest pressure 09/27/2019   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   Neck pain 06/23/2015   Morbid obesity (HCC) 05/08/2015   DJD (degenerative joint disease) of knee 07/14/2014   Acute upper respiratory infection 06/12/2014   Cough 06/12/2014   Primary localized osteoarthritis of  left knee    Hyperlipidemia    GERD (gastroesophageal reflux disease)    Back pain    Dyspnea 05/07/2014   Multiple pulmonary nodules 05/07/2014   Thoracic radiculitis 08/19/2013    PCP: Marvine Rush, MD  REFERRING PROVIDER: Mavis Purchase, MD  REFERRING DIAG: M43.16 (ICD-10-CM) - Spondylolisthesis, lumbar region,  POSTERIOR LUMBAR INTERBODY FUSION-LUMBAR FIVE-SACRAL ONE, WITH INTERBODY PROSTHESIS AND POSTERIOR INSTRUMENTATION FROM LUMBAR FIVE TO ILIUM AND EXPLORATION OF FUSION LUMBAR TWO-LUMBAR FIVE (04/26/23)    Rationale for Evaluation  and Treatment: Rehabilitation  THERAPY DIAG:  Difficulty in walking, not elsewhere classified  Lumbar spondylosis  Muscle weakness (generalized)  Other abnormalities of gait and mobility  ONSET DATE: POSTERIOR LUMBAR INTERBODY FUSION-LUMBAR FIVE-SACRAL ONE, WITH INTERBODY PROSTHESIS AND POSTERIOR INSTRUMENTATION FROM LUMBAR FIVE TO ILIUM AND EXPLORATION OF FUSION LUMBAR TWO-LUMBAR FIVE (04/26/23)  SUBJECTIVE:                                                                                                                                                                                           SUBJECTIVE STATEMENT: Doing well today and denies pain. Patient reports that he has been doing his HEP without issues  EVAL: Arrives to the clinic with some foot pain when he walks. However, denies pain at the moment even on the surgical area. Patient also reports that the L side feels weak and has been pre-existing before the surgery. Patient is also numb on the L knee down. Patient also reports that he tends to drag the L LE and he can trip. Patient had lumbar fusion on 04/26/23 because of disc degeneration. Patient was only admitted overnight and had PT the following day at the hospital and then was sent home. Patient had PT in the past which did not help. Patient is now 5 weeks post-op.   PERTINENT HISTORY:  R THR 2023, L TKR 2016, R TKR 2021, hx of back surgery  PAIN:  Are you having pain? Yes: NPRS scale: 5 Pain location: inner side of the L foot Pain description: throbbing, tingling Aggravating factors: walking around 0.5 miles, at night Relieving factors: gabapentin   PRECAUTIONS: Other: lumbar fusion protocol, back brace till the end of January 2025 (can be taken off when sitting)  RED FLAGS: None   WEIGHT BEARING RESTRICTIONS: No  FALLS:  Has patient fallen in last 6 months? No  LIVING ENVIRONMENT: Lives with: lives with their spouse Lives in: House/apartment Stairs: Yes:  External: 5 steps; on left going up Has following equipment at home: Single point cane, Quad cane small base, Walker - 4 wheeled, and Ramped entry  OCCUPATION: retired  PLOF: Independent  and Independent with basic ADLs  PATIENT GOALS: I would like to get back my strength on the left leg  NEXT MD VISIT: March 2025  OBJECTIVE:  Note: Objective measures were completed at Evaluation unless otherwise noted.  DIAGNOSTIC FINDINGS:  04/26/23 EXAM: LUMBAR SPINE - 2-3 VIEW; DG C-ARM 1-60 MIN-NO REPORT   Radiation exposure index: 24.34 mGy.   COMPARISON:  Nov 02, 2021.   FINDINGS: Four intraoperative fluoroscopic images were obtained the lower lumbar spine. These images demonstrate surgical interbody fusion of L5-S1, as well as bilateral intrapedicular screws at S1.   IMPRESSION: Fluoroscopic guidance provided during lower lumbar surgery as noted above.    PATIENT SURVEYS:  FOTO [id number]  COGNITION: Overall cognitive status: Within functional limits for tasks assessed     SENSATION: Light touch: Impaired on L LE  MUSCLE LENGTH: Gastrocsoleus: moderate restriction on the L, mild restriction on R  POSTURE: rounded shoulders and forward head True leg length: R LE = 89 cm, L LE = 90.5 cm  LUMBAR ROM:   AROM Eval (Not tested)  Flexion   Extension   Right lateral flexion   Left lateral flexion   Right rotation   Left rotation    (Blank rows = not tested)  LOWER EXTREMITY ROM:     Active  Right eval Left eval  Hip flexion [facility]  Hip extension    Hip abduction    Hip adduction    Hip internal rotation    Hip external rotation    Knee flexion [facility] WFL  Knee extension [facility]  Ankle dorsiflexion [facility] WFL  Ankle plantarflexion [facility] WFL  Ankle inversion    Ankle eversion     (Blank rows = not tested)  LOWER EXTREMITY MMT:    MMT Right eval Left eval  Hip flexion 4- 4-  Hip extension    Hip abduction 4- 4-  Hip adduction    Hip internal rotation    Hip  external rotation    Knee flexion 4+ 4-  Knee extension 4+ 4-  Ankle dorsiflexion 4+ 3-  Ankle plantarflexion 4+ 3+  Ankle inversion    Ankle eversion     (Blank rows = not tested)  FUNCTIONAL TESTS:  5 times sit to stand: 07/19/23: 7.16 sec 2 minute walk test: 410 ft Tinetti POMA: 18 (balance + gait)  GAIT: Distance walked: 410 ft Assistive device utilized: None Level of assistance: Complete Independence Comments: done with , noticeable foot slap on initial contact  TREATMENT DATE:  07/19/23 5TSTS (see score above) NuStep, seat 11, level 2, seat  5' Seated hamstring stretch x 30 x 3 Seated piriformis stretch x 30 x 3 Seated hip flexor stretch x 30 x 2 Gastrocnemius slant board x 30 x 2 Seated on physioball, marches, neutral spine x 10 x 2 Seated on physioball, alternating knee ext, neutral spine x 10 x 2 Seated on physioball, alternating UE lifts, neutral spine x 10 x 2 Sit-to-stand, standard x 10  Standing rows, RTB x 3 x 10 x 2 Standing I exercises, RTB x 3 x 10 x 2 Standing hip abd, ext, YTB x 10 x 2 each  07/14/23 NuStep, seat 11, level 1, seat  5' Seated hamstring stretch x 30 x 3 Seated piriformis stretch x 30 x 3 Seated hip flexor stretch x 30 x 2 Gastrocnemius slant board x 30 x 2 Bridging x 3 x 10 x 2 Dead bug x 10 x 2 Seated abdominal press, neutral x 3  x 10 x 2 Sit-to-stand, standard x 10  Mini squats x 3 x 10 x 2 Standing hip abd, ext, YTB x 10 x 2  07/12/23 UBE, level 1, forward, 5' Seated hamstring stretch x 30 x 3 Seated piriformis stretch x 30 x 3 Seated hip flexor stretch x 30 x 2 Gastrocnemius slant board x 30 x 2 Bridging x 3 x 10 x 2 Dead bug x 10 x 2 Seated marches, neutral spine x 10 x 2 Seated alternating knee ext, neutral spine x 10 x 2 Seated alternating UE lifts, neutral spine x 10 x 2 Seated hip abd, RTB x 10 x 2  06/29/23 Seated hamstring stretch x 30 x 3 Seated piriformis stretch x 30 x 3 Seated hip flexor  stretch x 30 x 2 Gastrocnemius slant board x 30 x 3 Hooklying clamshells, RTB x 10 x 2 Supine, alternating shoulder flexion, with draw in x 10 x 2 Supine marches, with draw in, x 10 x 2 Supine alternating heel slides, with draw in, x 10 x 2 Supine bent knee fall outs with draw in x 10 x 2  06/27/23 Reviewed goals Seated hamstring stretch x 30 x 3 Seated piriformis stretch x 30 x 3 Seated calf stretch with a strap x 30 x 2 Supine marches, with draw in, x 10 x 2 Supine heel slides, with draw in, x 10 x 2 Supine hip abduction, with draw in x 10 x 2 Education on log rolling - patient  gave excellent verbal understanding  06/01/23 Evaluation and patient education done                                                                                                                                 PATIENT EDUCATION:  Education details: Educated on the pathoanatomy of lumbar fusion. Educated on the goals and course of rehab.  Person educated: Patient Education method: Explanation Education comprehension: verbalized understanding  HOME EXERCISE PROGRAM: Access Code: [email or web address] URL: [email or web address] 07/14/2023 - Mini Squat with Counter Support  - 1 x daily - 5-7 x weekly - 2 sets - 10 reps - 3 hold - Standing Hip Extension with Resistance at Ankles and Counter Support  - 1 x daily - 5-7 x weekly - 2 sets - 10 reps  07/12/2023 - Supine Bridge  - 1 x daily - 5-7 x weekly - 2 sets - 10 reps - 3 hold - Dead Bug  - 1 x daily - 5-7 x weekly - 2 sets - 10 reps - Seated March  - 1 x daily - 5-7 x weekly - 2 sets - 10 reps  06/29/2023 - Seated Hip Flexor Stretch  - 1 x daily - 5-7 x weekly - 3 reps - 30 hold - Hooklying Clamshell with Resistance  - 1 x daily - 5-7 x weekly - 2 sets - 10 reps  Date: 06/27/2023 Prepared by: Vinie Haver  Exercises - Seated Piriformis  Stretch  - 1 x daily - 5-7 x weekly - 3 reps - 30 hold - Seated Hamstring Stretch  - 1 x daily - 5-7  x weekly - 3 reps - 30 hold - Seated Calf Stretch with Strap  - 1 x daily - 5-7 x weekly - 3 reps - 30 hold - Supine March  - 1 x daily - 5-7 x weekly - 2 sets - 10 reps - Supine Heel Slide  - 1 x daily - 5-7 x weekly - 2 sets - 10 reps - Supine Hip Abduction  - 1 x daily - 5-7 x weekly - 2 sets - 10 reps  Patient Education - Logroll With Lumbar Precautions  ASSESSMENT:  CLINICAL IMPRESSION: Patient is 11 weeks post-op. Interventions today were geared towards LE flexibility and core strengthening. Tolerated all activities without worsening of symptoms except when patient reported pain = 2/10 with I exercises in standing. Demonstrated appropriate levels of fatigue. Provided slight amount of cueing to ensure correct execution of activity with good carry-over. To date, skilled PT is required to address the impairments and improve function.  EVAL: Patient is a 73 y.o. male who was seen today for physical therapy evaluation and treatment for s/p lumbar fusion. Patient's condition is further defined by difficulty with walking due to pain, weakness, impaired balance, and decreased soft tissue extensibility. Skilled PT is required to address the impairments and functional limitations listed below. Patient states that he will be out of town for the next 2-3 weeks and maybe back from PT around 06/28/2023. Patient was advised to continue his HEP provided by his PT from the hospital. Patient gave excellent understanding.  OBJECTIVE IMPAIRMENTS: Abnormal gait, decreased activity tolerance, decreased balance, difficulty walking, decreased strength, impaired flexibility, and pain.   ACTIVITY LIMITATIONS: carrying, lifting, bending, sitting, standing, squatting, stairs, and transfers  PARTICIPATION LIMITATIONS: meal prep, cleaning, laundry, driving, shopping, community activity, and yard work  PERSONAL FACTORS: Age, Past/current experiences, and 3+ comorbidities: R THR 2023, L TKR 2016, R TKR 2021, hx of back  surgery  are also affecting patient's functional outcome.   REHAB POTENTIAL: Fair    CLINICAL DECISION MAKING: Evolving/moderate complexity  EVALUATION COMPLEXITY: Moderate   GOALS: Goals reviewed with patient? Yes  SHORT TERM GOALS: Target date: On the 4th PT visit  Pt will demonstrate indep in HEP to facilitate carry-over of skilled services and improve functional outcomes Goal status: INITIAL  LONG TERM GOALS: Target date: On the 16th PT visit  Pt will increase by at least 40 ft in order to demonstrate clinically significant improvement in community ambulation Baseline: 410 ft Goal status: INITIAL  2.  Patient will demonstrate increase in Tinetti Score by 2 points in order to demonstrate clinically significant improvement in balance and decreased risk for falls  Baseline: 18 Goal status: INITIAL  3.  Pt will demonstrate increase in LE strength to 4-/5 to facilitate ease and safety in ambulation Baseline: 3-/5 Goal status: INITIAL  4.  Pt will decrease 5TSTS by at least 3 seconds in order to demonstrate clinically significant improvement in LE strength  Baseline: to be determined Goal status: INITIAL  5.  Pt will increase FOTO to at least 59 in order to demonstrate significant improvement in function related to  ADLs and ambulation Baseline: 49.8 Goal status: INITIAL  PLAN:  PT FREQUENCY: 2x/week  PT DURATION: 8 weeks  PLANNED INTERVENTIONS: [id number]- PT Re-evaluation, 97110-Therapeutic exercises, 97530- Therapeutic activity, [id number]- Neuromuscular re-education, 97535- Self Care, [id number]-  Manual therapy, [id number]- Gait training, Patient/Family education, Cryotherapy, and Moist heat.  PLAN FOR NEXT SESSION: Continue POC and may progress as tolerated with emphasis on flexibility, core and LE strengthening per protocol.   Vinie CROME. Casilda Pickerill, PT, DPT, OCS Board-Certified Clinical Specialist in Orthopedic PT PT Compact Privilege # ([place]): [id number] T 07/19/2023, 1:34 PM

## 2023-07-20 ENCOUNTER — Other Ambulatory Visit (HOSPITAL_COMMUNITY): Payer: Self-pay | Admitting: Family Medicine

## 2023-07-20 ENCOUNTER — Ambulatory Visit (HOSPITAL_COMMUNITY)
Admission: RE | Admit: 2023-07-20 | Discharge: 2023-07-20 | Disposition: A | Discharge status: Home / Self Care | Payer: PPO | Source: Ambulatory Visit | Attending: Family Medicine | Admitting: Family Medicine

## 2023-07-20 DIAGNOSIS — R1030 Lower abdominal pain, unspecified: Secondary | ICD-10-CM | POA: Insufficient documentation

## 2023-07-20 DIAGNOSIS — R6889 Other general symptoms and signs: Secondary | ICD-10-CM | POA: Diagnosis not present

## 2023-07-20 DIAGNOSIS — Z6831 Body mass index (BMI) 31.0-31.9, adult: Secondary | ICD-10-CM | POA: Diagnosis not present

## 2023-07-20 DIAGNOSIS — K5730 Diverticulosis of large intestine without perforation or abscess without bleeding: Secondary | ICD-10-CM | POA: Diagnosis not present

## 2023-07-20 DIAGNOSIS — N200 Calculus of kidney: Secondary | ICD-10-CM | POA: Diagnosis not present

## 2023-07-20 DIAGNOSIS — K7689 Other specified diseases of liver: Secondary | ICD-10-CM | POA: Diagnosis not present

## 2023-07-20 DIAGNOSIS — E6609 Other obesity due to excess calories: Secondary | ICD-10-CM | POA: Diagnosis not present

## 2023-07-20 DIAGNOSIS — N400 Enlarged prostate without lower urinary tract symptoms: Secondary | ICD-10-CM | POA: Diagnosis not present

## 2023-07-20 DIAGNOSIS — Z20828 Contact with and (suspected) exposure to other viral communicable diseases: Secondary | ICD-10-CM | POA: Diagnosis not present

## 2023-07-20 LAB — POCT I-STAT CREATININE: Creatinine, Ser: 1.5 mg/dL — ABNORMAL HIGH (ref 0.61–1.24)

## 2023-07-20 MED ORDER — IOHEXOL 300 MG/ML  SOLN
100.0000 mL | Freq: Once | INTRAMUSCULAR | Status: AC | PRN
  Administered 2023-07-20: 100 mL via INTRAVENOUS

## 2023-07-21 ENCOUNTER — Ambulatory Visit (HOSPITAL_COMMUNITY): Payer: PPO

## 2023-07-21 DIAGNOSIS — M47816 Spondylosis without myelopathy or radiculopathy, lumbar region: Secondary | ICD-10-CM

## 2023-07-21 DIAGNOSIS — R262 Difficulty in walking, not elsewhere classified: Secondary | ICD-10-CM | POA: Diagnosis not present

## 2023-07-21 DIAGNOSIS — M6281 Muscle weakness (generalized): Secondary | ICD-10-CM

## 2023-07-21 NOTE — Therapy (Signed)
 OUTPATIENT PHYSICAL THERAPY THORACOLUMBAR TREATMENT   Patient Name: Patrick Mathews MRN: [id number] DOB:[birth year], 73 y.o., male Today's Date: 07/21/2023  END OF SESSION:  PT End of Session - 07/21/23 1303     Visit Number 7    Number of Visits 16    Date for PT Re-Evaluation 08/24/23    Authorization Type Medicare Part A/B    Progress Note Due on Visit 8    PT Start Time 1300    PT Stop Time 1340    PT Time Calculation (min) 40 min    Activity Tolerance Patient tolerated treatment well    Behavior During Therapy [facility] for tasks assessed/performed              Past Medical History:  Diagnosis Date   Adjacent segment disease with spinal stenosis    Aortic insufficiency 02/21/2023   TTE 05/25/2021: EF 55-60, no RWMA, GLS -17.9, normal RVSF, mild LAE, trivial MR, mild to moderate AI, AV sclerosis, mild dilation of aortic root (38 mm), mild dilation of ascending aorta (41 mm), RAP 3    Arthritis    knees; back (07/14/2014)   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   TTE 05/2021: 41 mm  CT 05/2021: 39 mm    Asthma    CAD (coronary artery disease)    Exertional dyspnea    GERD (gastroesophageal reflux disease)    Hyperlipidemia    Hypertension    Neuropathy    Primary localized osteoarthritis of left knee    Primary localized osteoarthritis of right knee 05/04/2020   Seasonal allergies    Past Surgical History:  Procedure Laterality Date   ANTERIOR CERVICAL DECOMP/DISCECTOMY FUSION  1999   Dr Leeann SANES SURGERY     BUNIONECTOMY Right 2013   COLONOSCOPY N/A 11/06/2012   Procedure: COLONOSCOPY;  Surgeon: Oneil DELENA Budge, MD;  Location: [facility];  Service: Gastroenterology;  Laterality: N/A;   FOOT SURGERY Right 2013   bunionectomy   KNEE ARTHROSCOPY Bilateral 1990's   POSTERIOR LUMBAR FUSION  1985; 2009   Dr Leeann   TONSILLECTOMY  1950's   TOTAL HIP ARTHROPLASTY Right 12/07/2021   Procedure: TOTAL HIP ARTHROPLASTY ANTERIOR APPROACH;  Surgeon: Beverley Evalene BIRCH, MD;  Location: [facility];  Service: Orthopedics;  Laterality: Right;   TOTAL KNEE ARTHROPLASTY Left 07/14/2014   Procedure: LEFT TOTAL KNEE ARTHROPLASTY;  Surgeon: Lamar DELENA Millman, MD;  Location: [facility] OR;  Service: Orthopedics;  Laterality: Left;   TOTAL KNEE ARTHROPLASTY Right 05/11/2020   Procedure: TOTAL KNEE ARTHROPLASTY;  Surgeon: Millman Lamar, MD;  Location: [facility];  Service: Orthopedics;  Laterality: Right;   WISDOM TOOTH EXTRACTION     Patient Active Problem List   Diagnosis Date Noted   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   Preoperative cardiovascular examination 02/21/2023   Aortic insufficiency 02/21/2023   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   Essential hypertension 02/21/2023   History of lumbar fusion 08/09/2022   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   CAD (coronary artery disease) 08/16/2021   DOE (dyspnea on exertion) 08/16/2021   Tremor 04/27/2021   Gait abnormality 04/27/2021   Primary localized osteoarthritis of right knee 05/04/2020   Lumbar adjacent segment disease with spondylolisthesis 02/06/2020   Chest pressure 09/27/2019   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   Neck pain 06/23/2015   Morbid obesity (HCC) 05/08/2015   DJD (degenerative joint disease) of knee 07/14/2014   Acute upper respiratory infection 06/12/2014  Cough 06/12/2014   Primary localized osteoarthritis of left knee    Hyperlipidemia    GERD (gastroesophageal reflux disease)    Back pain    Dyspnea 05/07/2014   Multiple pulmonary nodules 05/07/2014   Thoracic radiculitis 08/19/2013    PCP: Marvine Rush, MD  REFERRING PROVIDER: Mavis Purchase, MD  REFERRING DIAG: M43.16 (ICD-10-CM) - Spondylolisthesis, lumbar region,  POSTERIOR LUMBAR INTERBODY FUSION-LUMBAR FIVE-SACRAL ONE, WITH INTERBODY PROSTHESIS AND POSTERIOR INSTRUMENTATION FROM LUMBAR FIVE TO ILIUM AND EXPLORATION OF FUSION LUMBAR TWO-LUMBAR FIVE  (04/26/23)    Rationale for Evaluation and Treatment: Rehabilitation  THERAPY DIAG:  Difficulty in walking, not elsewhere classified  Lumbar spondylosis  Muscle weakness (generalized)  ONSET DATE: POSTERIOR LUMBAR INTERBODY FUSION-LUMBAR FIVE-SACRAL ONE, WITH INTERBODY PROSTHESIS AND POSTERIOR INSTRUMENTATION FROM LUMBAR FIVE TO ILIUM AND EXPLORATION OF FUSION LUMBAR TWO-LUMBAR FIVE (04/26/23)  SUBJECTIVE:                                                                                                                                                                                           SUBJECTIVE STATEMENT: Doing well today and denies pain. Patient states that he was okay after the last session.  EVAL: Arrives to the clinic with some foot pain when he walks. However, denies pain at the moment even on the surgical area. Patient also reports that the L side feels weak and has been pre-existing before the surgery. Patient is also numb on the L knee down. Patient also reports that he tends to drag the L LE and he can trip. Patient had lumbar fusion on 04/26/23 because of disc degeneration. Patient was only admitted overnight and had PT the following day at the hospital and then was sent home. Patient had PT in the past which did not help. Patient is now 5 weeks post-op.   PERTINENT HISTORY:  R THR 2023, L TKR 2016, R TKR 2021, hx of back surgery  PAIN:  Are you having pain? Yes: NPRS scale: 5 Pain location: inner side of the L foot Pain description: throbbing, tingling Aggravating factors: walking around 0.5 miles, at night Relieving factors: gabapentin   PRECAUTIONS: Other: lumbar fusion protocol, back brace till the end of January 2025 (can be taken off when sitting)  RED FLAGS: None   WEIGHT BEARING RESTRICTIONS: No  FALLS:  Has patient fallen in last 6 months? No  LIVING ENVIRONMENT: Lives with: lives with their spouse Lives in: House/apartment Stairs: Yes: External: 5  steps; on left going up Has following equipment at home: Single point cane, Quad cane small base, Walker - 4 wheeled, and Ramped entry  OCCUPATION: retired  PLOF: Independent  and Independent with basic ADLs  PATIENT GOALS: I would like to get back my strength on the left leg  NEXT MD VISIT: March 2025  OBJECTIVE:  Note: Objective measures were completed at Evaluation unless otherwise noted.  DIAGNOSTIC FINDINGS:  04/26/23 EXAM: LUMBAR SPINE - 2-3 VIEW; DG C-ARM 1-60 MIN-NO REPORT   Radiation exposure index: 24.34 mGy.   COMPARISON:  Nov 02, 2021.   FINDINGS: Four intraoperative fluoroscopic images were obtained the lower lumbar spine. These images demonstrate surgical interbody fusion of L5-S1, as well as bilateral intrapedicular screws at S1.   IMPRESSION: Fluoroscopic guidance provided during lower lumbar surgery as noted above.    PATIENT SURVEYS:  FOTO [id number]  COGNITION: Overall cognitive status: Within functional limits for tasks assessed     SENSATION: Light touch: Impaired on L LE  MUSCLE LENGTH: Gastrocsoleus: moderate restriction on the L, mild restriction on R  POSTURE: rounded shoulders and forward head True leg length: R LE = 89 cm, L LE = 90.5 cm  LUMBAR ROM:   AROM Eval (Not tested)  Flexion   Extension   Right lateral flexion   Left lateral flexion   Right rotation   Left rotation    (Blank rows = not tested)  LOWER EXTREMITY ROM:     Active  Right eval Left eval  Hip flexion [facility]  Hip extension    Hip abduction    Hip adduction    Hip internal rotation    Hip external rotation    Knee flexion [facility] WFL  Knee extension [facility]  Ankle dorsiflexion [facility] WFL  Ankle plantarflexion [facility] WFL  Ankle inversion    Ankle eversion     (Blank rows = not tested)  LOWER EXTREMITY MMT:    MMT Right eval Left eval  Hip flexion 4- 4-  Hip extension    Hip abduction 4- 4-  Hip adduction    Hip internal rotation    Hip external  rotation    Knee flexion 4+ 4-  Knee extension 4+ 4-  Ankle dorsiflexion 4+ 3-  Ankle plantarflexion 4+ 3+  Ankle inversion    Ankle eversion     (Blank rows = not tested)  FUNCTIONAL TESTS:  5 times sit to stand: 07/19/23: 7.16 sec 2 minute walk test: 410 ft Tinetti POMA: 18 (balance + gait)  GAIT: Distance walked: 410 ft Assistive device utilized: None Level of assistance: Complete Independence Comments: done with , noticeable foot slap on initial contact  TREATMENT DATE:  07/21/23 NuStep, seat 11, level 3, seat  5' Seated hamstring stretch x 30 x 2 Seated piriformis stretch x 30 x 2 Seated hip flexor stretch x 30 x 2 Gastrocnemius slant board x 30 x 2 Seated on physioball, marches, neutral spine x 10 x 2 Seated on physioball, alternating knee ext, neutral spine x 10 x 2 Seated on physioball, yellow med ball lifts, neutral spine x 10 x 2 Seated on physioball, chop and lift, neutral spine x 10 on each side Standing heel taps on a small orange cone x 10 x 2 on each Stepping over 4 hurdles x 5 rounds inside // bars Standing heel/toe raises x 10 x 2 Hip vectors RTB x 10 x 2 each  07/19/23 5TSTS (see score above) NuStep, seat 11, level 2, seat  5' Seated hamstring stretch x 30 x 3 Seated piriformis stretch x 30 x 3 Seated hip flexor stretch x 30 x 2 Gastrocnemius slant board x 30 x 2  Seated on physioball, marches, neutral spine x 10 x 2 Seated on physioball, alternating knee ext, neutral spine x 10 x 2 Seated on physioball, alternating UE lifts, neutral spine x 10 x 2 Sit-to-stand, standard x 10  Standing rows, RTB x 3 x 10 x 2 Standing I exercises, RTB x 3 x 10 x 2 Standing hip abd, ext, YTB x 10 x 2 each  07/14/23 NuStep, seat 11, level 1, seat  5' Seated hamstring stretch x 30 x 3 Seated piriformis stretch x 30 x 3 Seated hip flexor stretch x 30 x 2 Gastrocnemius slant board x 30 x 2 Bridging x 3 x 10 x 2 Dead bug x 10 x 2 Seated abdominal  press, neutral x 3 x 10 x 2 Sit-to-stand, standard x 10  Mini squats x 3 x 10 x 2 Standing hip abd, ext, YTB x 10 x 2  07/12/23 UBE, level 1, forward, 5' Seated hamstring stretch x 30 x 3 Seated piriformis stretch x 30 x 3 Seated hip flexor stretch x 30 x 2 Gastrocnemius slant board x 30 x 2 Bridging x 3 x 10 x 2 Dead bug x 10 x 2 Seated marches, neutral spine x 10 x 2 Seated alternating knee ext, neutral spine x 10 x 2 Seated alternating UE lifts, neutral spine x 10 x 2 Seated hip abd, RTB x 10 x 2  06/29/23 Seated hamstring stretch x 30 x 3 Seated piriformis stretch x 30 x 3 Seated hip flexor stretch x 30 x 2 Gastrocnemius slant board x 30 x 3 Hooklying clamshells, RTB x 10 x 2 Supine, alternating shoulder flexion, with draw in x 10 x 2 Supine marches, with draw in, x 10 x 2 Supine alternating heel slides, with draw in, x 10 x 2 Supine bent knee fall outs with draw in x 10 x 2  06/27/23 Reviewed goals Seated hamstring stretch x 30 x 3 Seated piriformis stretch x 30 x 3 Seated calf stretch with a strap x 30 x 2 Supine marches, with draw in, x 10 x 2 Supine heel slides, with draw in, x 10 x 2 Supine hip abduction, with draw in x 10 x 2 Education on log rolling - patient  gave excellent verbal understanding  06/01/23 Evaluation and patient education done                                                                                                                                 PATIENT EDUCATION:  Education details: Educated on the pathoanatomy of lumbar fusion. Educated on the goals and course of rehab.  Person educated: Patient Education method: Explanation Education comprehension: verbalized understanding  HOME EXERCISE PROGRAM: Access Code: [email or web address] URL: [email or web address] 07/21/2023 - Heel Toe Raises with Counter Support  - 1 x daily - 5-7 x weekly - 2 sets - 10 reps - Standing 3-Way Leg Reach with Resistance at Ankles and  Counter Support  -  1 x daily - 5-7 x weekly - 2 sets - 10 rep  07/14/2023 - Mini Squat with Counter Support  - 1 x daily - 5-7 x weekly - 2 sets - 10 reps - 3 hold - Standing Hip Extension with Resistance at Ankles and Counter Support  - 1 x daily - 5-7 x weekly - 2 sets - 10 reps  07/12/2023 - Supine Bridge  - 1 x daily - 5-7 x weekly - 2 sets - 10 reps - 3 hold - Dead Bug  - 1 x daily - 5-7 x weekly - 2 sets - 10 reps - Seated March  - 1 x daily - 5-7 x weekly - 2 sets - 10 reps  06/29/2023 - Seated Hip Flexor Stretch  - 1 x daily - 5-7 x weekly - 3 reps - 30 hold - Hooklying Clamshell with Resistance  - 1 x daily - 5-7 x weekly - 2 sets - 10 reps  Date: 06/27/2023 Prepared by: Vinie Haver  Exercises - Seated Piriformis Stretch  - 1 x daily - 5-7 x weekly - 3 reps - 30 hold - Seated Hamstring Stretch  - 1 x daily - 5-7 x weekly - 3 reps - 30 hold - Seated Calf Stretch with Strap  - 1 x daily - 5-7 x weekly - 3 reps - 30 hold - Supine March  - 1 x daily - 5-7 x weekly - 2 sets - 10 reps - Supine Heel Slide  - 1 x daily - 5-7 x weekly - 2 sets - 10 reps - Supine Hip Abduction  - 1 x daily - 5-7 x weekly - 2 sets - 10 reps  Patient Education - Logroll With Lumbar Precautions  ASSESSMENT:  CLINICAL IMPRESSION: Patient is 12 weeks post-op. Interventions today were geared towards LE flexibility and core strengthening. Tolerated all activities without worsening of symptoms. Demonstrated appropriate levels of fatigue. Provided slight amount of cueing to ensure correct execution of activity with good carry-over. To date, skilled PT is required to address the impairments and improve function.  EVAL: Patient is a 73 y.o. male who was seen today for physical therapy evaluation and treatment for s/p lumbar fusion. Patient's condition is further defined by difficulty with walking due to pain, weakness, impaired balance, and decreased soft tissue extensibility. Skilled PT is required to  address the impairments and functional limitations listed below. Patient states that he will be out of town for the next 2-3 weeks and maybe back from PT around 06/28/2023. Patient was advised to continue his HEP provided by his PT from the hospital. Patient gave excellent understanding.  OBJECTIVE IMPAIRMENTS: Abnormal gait, decreased activity tolerance, decreased balance, difficulty walking, decreased strength, impaired flexibility, and pain.   ACTIVITY LIMITATIONS: carrying, lifting, bending, sitting, standing, squatting, stairs, and transfers  PARTICIPATION LIMITATIONS: meal prep, cleaning, laundry, driving, shopping, community activity, and yard work  PERSONAL FACTORS: Age, Past/current experiences, and 3+ comorbidities: R THR 2023, L TKR 2016, R TKR 2021, hx of back surgery  are also affecting patient's functional outcome.   REHAB POTENTIAL: Fair    CLINICAL DECISION MAKING: Evolving/moderate complexity  EVALUATION COMPLEXITY: Moderate   GOALS: Goals reviewed with patient? Yes  SHORT TERM GOALS: Target date: On the 4th PT visit  Pt will demonstrate indep in HEP to facilitate carry-over of skilled services and improve functional outcomes Goal status: INITIAL  LONG TERM GOALS: Target date: On the 16th PT visit  Pt will increase by at  least 40 ft in order to demonstrate clinically significant improvement in community ambulation Baseline: 410 ft Goal status: INITIAL  2.  Patient will demonstrate increase in Tinetti Score by 2 points in order to demonstrate clinically significant improvement in balance and decreased risk for falls  Baseline: 18 Goal status: INITIAL  3.  Pt will demonstrate increase in LE strength to 4-/5 to facilitate ease and safety in ambulation Baseline: 3-/5 Goal status: INITIAL  4.  Pt will decrease 5TSTS by at least 3 seconds in order to demonstrate clinically significant improvement in LE strength  Baseline: to be determined Goal status:  INITIAL  5.  Pt will increase FOTO to at least 59 in order to demonstrate significant improvement in function related to  ADLs and ambulation Baseline: 49.8 Goal status: INITIAL  PLAN:  PT FREQUENCY: 2x/week  PT DURATION: 8 weeks  PLANNED INTERVENTIONS: [id number]- PT Re-evaluation, 97110-Therapeutic exercises, 97530- Therapeutic activity, 97112- Neuromuscular re-education, 97535- Self Care, [id number]- Manual therapy, [id number]- Gait training, Patient/Family education, Cryotherapy, and Moist heat.  PLAN FOR NEXT SESSION: Continue POC and may progress as tolerated with emphasis on flexibility, core and LE strengthening per protocol. Re-assess next visit   Vinie L. Jolee Critcher, PT, DPT, OCS Board-Certified Clinical Specialist in Orthopedic PT PT Compact Privilege # (): [id number] T 07/21/2023, 1:45 PM

## 2023-07-24 DIAGNOSIS — Z6832 Body mass index (BMI) 32.0-32.9, adult: Secondary | ICD-10-CM | POA: Diagnosis not present

## 2023-07-24 DIAGNOSIS — U071 COVID-19: Secondary | ICD-10-CM | POA: Diagnosis not present

## 2023-07-24 DIAGNOSIS — E6609 Other obesity due to excess calories: Secondary | ICD-10-CM | POA: Diagnosis not present

## 2023-07-24 DIAGNOSIS — Z20828 Contact with and (suspected) exposure to other viral communicable diseases: Secondary | ICD-10-CM | POA: Diagnosis not present

## 2023-07-26 ENCOUNTER — Encounter (HOSPITAL_COMMUNITY): Payer: Medicare Other

## 2023-07-28 ENCOUNTER — Encounter (HOSPITAL_COMMUNITY): Payer: Medicare Other

## 2023-08-02 ENCOUNTER — Encounter (HOSPITAL_COMMUNITY): Payer: Medicare Other

## 2023-08-04 ENCOUNTER — Ambulatory Visit (HOSPITAL_COMMUNITY): Payer: PPO

## 2023-08-04 DIAGNOSIS — R262 Difficulty in walking, not elsewhere classified: Secondary | ICD-10-CM | POA: Diagnosis not present

## 2023-08-04 DIAGNOSIS — R29898 Other symptoms and signs involving the musculoskeletal system: Secondary | ICD-10-CM

## 2023-08-04 DIAGNOSIS — M6281 Muscle weakness (generalized): Secondary | ICD-10-CM

## 2023-08-04 DIAGNOSIS — R2689 Other abnormalities of gait and mobility: Secondary | ICD-10-CM

## 2023-08-04 DIAGNOSIS — M47816 Spondylosis without myelopathy or radiculopathy, lumbar region: Secondary | ICD-10-CM

## 2023-08-04 NOTE — Therapy (Addendum)
 OUTPATIENT PHYSICAL THERAPY THORACOLUMBAR PROGRESS/DISCHARGE NOTE   Patient Name: Patrick Mathews MRN: [id number] DOB:10/18/1950, 73 y.o., male Today's Date: 08/04/2023  END OF SESSION:  PT End of Session - 08/04/23 1336     Visit Number 8    Number of Visits 16    Date for PT Re-Evaluation 08/24/23    Authorization Type Medicare Part A/B    Progress Note Due on Visit 8    PT Start Time 1300    PT Stop Time 1340    PT Time Calculation (min) 40 min    Activity Tolerance Patient tolerated treatment well    Behavior During Therapy [facility] for tasks assessed/performed               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   TTE 05/2021: 41 mm  CT 05/2021: 39 mm    Asthma    CAD (coronary artery disease)    Exertional dyspnea    GERD (gastroesophageal reflux disease)    Hyperlipidemia    Hypertension    Neuropathy    Primary localized osteoarthritis of left knee    Primary localized osteoarthritis of right knee 05/04/2020   Seasonal allergies    Past Surgical History:  Procedure Laterality Date   ANTERIOR CERVICAL DECOMP/DISCECTOMY FUSION  1999   Dr Charna Busman SURGERY     BUNIONECTOMY Right 2013   COLONOSCOPY N/A 11/06/2012   Procedure: COLONOSCOPY;  Surgeon: Dalia Heading, MD;  Location: [facility];  Service: Gastroenterology;  Laterality: N/A;   FOOT SURGERY Right 2013   bunionectomy   KNEE ARTHROSCOPY Bilateral 1990's   POSTERIOR LUMBAR FUSION  1985; 2009   Dr Jeral Fruit   TONSILLECTOMY  1950's   TOTAL HIP ARTHROPLASTY Right 12/07/2021   Procedure: TOTAL HIP ARTHROPLASTY ANTERIOR APPROACH;   Surgeon: Sheral Apley, MD;  Location: [facility];  Service: Orthopedics;  Laterality: Right;   TOTAL KNEE ARTHROPLASTY Left 07/14/2014   Procedure: LEFT TOTAL KNEE ARTHROPLASTY;  Surgeon: Nilda Simmer, MD;  Location: [facility] OR;  Service: Orthopedics;  Laterality: Left;   TOTAL KNEE ARTHROPLASTY Right 05/11/2020   Procedure: TOTAL KNEE ARTHROPLASTY;  Surgeon: Salvatore Marvel, MD;  Location: [facility];  Service: Orthopedics;  Laterality: Right;   WISDOM TOOTH EXTRACTION     Patient Active Problem List   Diagnosis Date Noted   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   Preoperative cardiovascular examination 02/21/2023   Aortic insufficiency 02/21/2023   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   Essential hypertension 02/21/2023   History of lumbar fusion 08/09/2022   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   CAD (coronary artery disease) 08/16/2021   DOE (dyspnea on exertion) 08/16/2021   Tremor 04/27/2021   Gait abnormality 04/27/2021   Primary localized osteoarthritis of right knee 05/04/2020   Lumbar adjacent segment disease with spondylolisthesis 02/06/2020   Chest pressure 09/27/2019   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   Neck pain 06/23/2015   Morbid obesity (HCC) 05/08/2015   DJD (degenerative joint disease) of knee 07/14/2014   Acute upper respiratory infection  06/12/2014   Cough 06/12/2014   Primary localized osteoarthritis of left knee    Hyperlipidemia    GERD (gastroesophageal reflux disease)    Back pain    Dyspnea 05/07/2014   Multiple pulmonary nodules 05/07/2014   Thoracic radiculitis 08/19/2013   Progress Note  Reporting Period 06/01/23 to 08/04/23  See note below for Objective Data and Assessment of Progress/Goals.   PCP: Assunta Found, MD  REFERRING PROVIDER: Tressie Stalker, MD  REFERRING DIAG: [phone] (ICD-10-CM) - Spondylolisthesis, lumbar region,  POSTERIOR LUMBAR INTERBODY FUSION-LUMBAR FIVE-SACRAL  ONE, WITH INTERBODY PROSTHESIS AND POSTERIOR INSTRUMENTATION FROM LUMBAR FIVE TO ILIUM AND EXPLORATION OF FUSION LUMBAR TWO-LUMBAR FIVE (04/26/23)    Rationale for Evaluation and Treatment: Rehabilitation  THERAPY DIAG:  Difficulty in walking, not elsewhere classified  Lumbar spondylosis  Muscle weakness (generalized)  Other abnormalities of gait and mobility  Weakness of left lower extremity  ONSET DATE: POSTERIOR LUMBAR INTERBODY FUSION-LUMBAR FIVE-SACRAL ONE, WITH INTERBODY PROSTHESIS AND POSTERIOR INSTRUMENTATION FROM LUMBAR FIVE TO ILIUM AND EXPLORATION OF FUSION LUMBAR TWO-LUMBAR FIVE (04/26/23)  SUBJECTIVE:                                                                                                                                                                                           SUBJECTIVE STATEMENT: PROGRESS NOTE 08/04/23: Patient had COVID and is now tested negative. Patient states that he's feeling a little better now but claims that he's slightly weak. Denies having any pain on the back. Patient thinks that PT has helped him so far and he's around 75% although he still drags the leg a little bit. Denies recent episodes of falls. Patient thinks he can just do his HEP at home.  EVAL: Arrives to the clinic with some foot pain when he walks. However, denies pain at the moment even on the surgical area. Patient also reports that the L side feels weak and has been pre-existing before the surgery. Patient is also numb on the L knee down. Patient also reports that he tends to drag the L LE and he can trip. Patient had lumbar fusion on 04/26/23 because of disc degeneration. Patient was only admitted overnight and had PT the following day at the hospital and then was sent home. Patient had PT in the past which did not help. Patient is now 5 weeks post-op.   PERTINENT HISTORY:  R THR 2023, L TKR 2016, R TKR 2021, hx of back surgery  PAIN:  Are you having pain? Yes: NPRS scale:  5 Pain location: inner side of the L foot Pain description: throbbing, tingling Aggravating factors: walking around 0.5 miles, at night  Relieving factors: gabapentin  PRECAUTIONS: Other: lumbar fusion protocol, back brace till the end of January 2025 (can be taken off when sitting)  RED FLAGS: None   WEIGHT BEARING RESTRICTIONS: No  FALLS:  Has patient fallen in last 6 months? No  LIVING ENVIRONMENT: Lives with: lives with their spouse Lives in: House/apartment Stairs: Yes: External: 5 steps; on left going up Has following equipment at home: Single point cane, Quad cane small base, Walker - 4 wheeled, and Ramped entry  OCCUPATION: retired  PLOF: Independent and Independent with basic ADLs  PATIENT GOALS: "I would like to get back my strength on the left leg"  NEXT MD VISIT: March 2025  OBJECTIVE:  Note: Objective measures were completed at Evaluation unless otherwise noted.  DIAGNOSTIC FINDINGS:  04/26/23 EXAM: LUMBAR SPINE - 2-3 VIEW; DG C-ARM 1-60 MIN-NO REPORT   Radiation exposure index: 24.34 mGy.   COMPARISON:  Nov 02, 2021.   FINDINGS: Four intraoperative fluoroscopic images were obtained the lower lumbar spine. These images demonstrate surgical interbody fusion of L5-S1, as well as bilateral intrapedicular screws at S1.   IMPRESSION: Fluoroscopic guidance provided during lower lumbar surgery as noted above.    PATIENT SURVEYS:  FOTO 08/04/23: 65.16 from 49.80  COGNITION: Overall cognitive status: Within functional limits for tasks assessed     SENSATION: Light touch: Impaired on L LE  MUSCLE LENGTH: Gastrocsoleus: moderate restriction on the L, mild restriction on R  POSTURE: rounded shoulders and forward head True leg length: R LE = 89 cm, L LE = 90.5 cm  LUMBAR ROM:   AROM Eval (Not tested) 08/04/23  Flexion  100%  Extension  50%  Right lateral flexion    Left lateral flexion    Right rotation  100%  Left rotation  100%   (Blank rows  = not tested)  LOWER EXTREMITY ROM:     Active  Right eval Left eval  Hip flexion [facility]  Hip extension    Hip abduction    Hip adduction    Hip internal rotation    Hip external rotation    Knee flexion [facility]  Knee extension [facility]  Ankle dorsiflexion [facility]  Ankle plantarflexion [facility]  Ankle inversion    Ankle eversion     (Blank rows = not tested)  LOWER EXTREMITY MMT:    MMT Right eval Left eval Right 08/04/23 Left 08/04/23  Hip flexion 4- 4- 4+ 4+  Hip extension   4+ 4+  Hip abduction 4- 4- 4+ 4+  Hip adduction      Hip internal rotation      Hip external rotation      Knee flexion 4+ 4- 4+ 4+  Knee extension 4+ 4- 4+ 4+  Ankle dorsiflexion 4+ 3- 4+ 4-  Ankle plantarflexion 4+ 3+ 4+ 4-  Ankle inversion      Ankle eversion       (Blank rows = not tested)  FUNCTIONAL TESTS:  5 times sit to stand: 08/04/23: 6.11 sec from 07/19/23: 7.16 sec 2 minute walk test: 08/04/23: 581 ft from 410 ft Tinetti POMA: 08/04/23: 24 from 18 (balance + gait)  GAIT: 08/04/23 Distance walked: 581 ft Assistive device utilized: None Level of assistance: Complete Independence Comments: done with , still noticeable foot slap on initial contact  TREATMENT DATE:  08/04/23 Progress note (FOTO, , 5TSTS, ROM, MMT, Tinetti)  07/21/23 NuStep, seat 11, level 3, seat  5' Seated hamstring stretch x 30" x 2 Seated  piriformis stretch x 30" x 2 Seated hip flexor stretch x 30" x 2 Gastrocnemius slant board x 30" x 2 Seated on physioball, marches, neutral spine x 10 x 2 Seated on physioball, alternating knee ext, neutral spine x 10 x 2 Seated on physioball, yellow med ball lifts, neutral spine x 10 x 2 Seated on physioball, chop and lift, neutral spine x 10 on each side Standing heel taps on a small orange cone x 10 x 2 on each Stepping over 4" hurdles x 5 rounds inside // bars Standing heel/toe raises x 10 x 2 Hip vectors RTB x 10 x 2 each  07/19/23 5TSTS (see score  above) NuStep, seat 11, level 2, seat  5' Seated hamstring stretch x 30" x 3 Seated piriformis stretch x 30" x 3 Seated hip flexor stretch x 30" x 2 Gastrocnemius slant board x 30" x 2 Seated on physioball, marches, neutral spine x 10 x 2 Seated on physioball, alternating knee ext, neutral spine x 10 x 2 Seated on physioball, alternating UE lifts, neutral spine x 10 x 2 Sit-to-stand, standard x 10  Standing rows, RTB x 3" x 10 x 2 Standing I exercises, RTB x 3" x 10 x 2 Standing hip abd, ext, YTB x 10 x 2 each  07/14/23 NuStep, seat 11, level 1, seat  5' Seated hamstring stretch x 30" x 3 Seated piriformis stretch x 30" x 3 Seated hip flexor stretch x 30" x 2 Gastrocnemius slant board x 30" x 2 Bridging x 3" x 10 x 2 Dead bug x 10 x 2 Seated abdominal press, neutral x 3" x 10 x 2 Sit-to-stand, standard x 10  Mini squats x 3" x 10 x 2 Standing hip abd, ext, YTB x 10 x 2  07/12/23 UBE, level 1, forward, 5' Seated hamstring stretch x 30" x 3 Seated piriformis stretch x 30" x 3 Seated hip flexor stretch x 30" x 2 Gastrocnemius slant board x 30" x 2 Bridging x 3" x 10 x 2 Dead bug x 10 x 2 Seated marches, neutral spine x 10 x 2 Seated alternating knee ext, neutral spine x 10 x 2 Seated alternating UE lifts, neutral spine x 10 x 2 Seated hip abd, RTB x 10 x 2  06/29/23 Seated hamstring stretch x 30" x 3 Seated piriformis stretch x 30" x 3 Seated hip flexor stretch x 30" x 2 Gastrocnemius slant board x 30" x 3 Hooklying clamshells, RTB x 10 x 2 Supine, alternating shoulder flexion, with draw in x 10 x 2 Supine marches, with draw in, x 10 x 2 Supine alternating heel slides, with draw in, x 10 x 2 Supine bent knee fall outs with draw in x 10 x 2  06/27/23 Reviewed goals Seated hamstring stretch x 30" x 3 Seated piriformis stretch x 30" x 3 Seated calf stretch with a strap x 30" x 2 Supine marches, with draw in, x 10 x 2 Supine heel slides, with draw in, x 10 x 2 Supine  hip abduction, with draw in x 10 x 2 Education on log rolling - patient  gave excellent verbal understanding  06/01/23 Evaluation and patient education done  PATIENT EDUCATION:  Education details: Educated on the pathoanatomy of lumbar fusion. Educated on the goals and course of rehab.  Person educated: Patient Education method: Explanation Education comprehension: verbalized understanding  HOME EXERCISE PROGRAM: Access Code: [email or web address] URL: [email or web address] 07/21/2023 - Heel Toe Raises with Counter Support  - 1 x daily - 5-7 x weekly - 2 sets - 10 reps - Standing 3-Way Leg Reach with Resistance at Ankles and Counter Support  - 1 x daily - 5-7 x weekly - 2 sets - 10 rep  07/14/2023 - Mini Squat with Counter Support  - 1 x daily - 5-7 x weekly - 2 sets - 10 reps - 3 hold - Standing Hip Extension with Resistance at Ankles and Counter Support  - 1 x daily - 5-7 x weekly - 2 sets - 10 reps  07/12/2023 - Supine Bridge  - 1 x daily - 5-7 x weekly - 2 sets - 10 reps - 3 hold - Dead Bug  - 1 x daily - 5-7 x weekly - 2 sets - 10 reps - Seated March  - 1 x daily - 5-7 x weekly - 2 sets - 10 reps  06/29/2023 - Seated Hip Flexor Stretch  - 1 x daily - 5-7 x weekly - 3 reps - 30 hold - Hooklying Clamshell with Resistance  - 1 x daily - 5-7 x weekly - 2 sets - 10 reps  Date: 06/27/2023 Prepared by: Krystal Clark  Exercises - Seated Piriformis Stretch  - 1 x daily - 5-7 x weekly - 3 reps - 30 hold - Seated Hamstring Stretch  - 1 x daily - 5-7 x weekly - 3 reps - 30 hold - Seated Calf Stretch with Strap  - 1 x daily - 5-7 x weekly - 3 reps - 30 hold - Supine March  - 1 x daily - 5-7 x weekly - 2 sets - 10 reps - Supine Heel Slide  - 1 x daily - 5-7 x weekly - 2 sets - 10 reps - Supine Hip Abduction  - 1 x daily - 5-7 x weekly - 2 sets - 10  reps  Patient Education - Logroll With Lumbar Precautions  ASSESSMENT:  CLINICAL IMPRESSION: PROGRESS NOTE 08/04/23: Patient demonstrated continued improvements in function as indicated by positive significant changes in and FOTO. Patient's LE strength has significantly improved as well as his balance (as indicated by the Tinetti score). Though patient still presents with a L foot slap, this does not interfere with his gait stability. Patient is also pain-free. Trunk ROM is WFL and 5TSTS is WNL. With this, skilled PT is not required at this time and patient is D/C from skilled PT. Patient may just continue his HEP on a daily basis to facilitate carry-over of the gains in PT. Patient gave excellent verbal understanding. If the foot slap starts to affect his gait stability, patient can come back to PT with a referral from a medical provider.   EVAL: Patient is a 73 y.o. male who was seen today for physical therapy evaluation and treatment for s/p lumbar fusion. Patient's condition is further defined by difficulty with walking due to pain, weakness, impaired balance, and decreased soft tissue extensibility. Skilled PT is required to address the impairments and functional limitations listed below. Patient states that he will be out of town for the next 2-3 weeks and maybe back from PT around 06/28/2023. Patient was advised to continue his HEP provided by his PT from the hospital.  Patient gave excellent understanding.  OBJECTIVE IMPAIRMENTS: Abnormal gait.   ACTIVITY LIMITATIONS: carrying, lifting, and squatting  PARTICIPATION LIMITATIONS: cleaning, laundry, shopping, community activity, and yard work  PERSONAL FACTORS: Age, Past/current experiences, and 3+ comorbidities: R THR 2023, L TKR 2016, R TKR 2021, hx of back surgery  are also affecting patient's functional outcome.   REHAB POTENTIAL: Good   GOALS: Goals reviewed with patient? Yes  SHORT TERM GOALS: Target date: On the 4th PT  visit  Pt will demonstrate indep in HEP to facilitate carry-over of skilled services and improve functional outcomes Goal status: MET  LONG TERM GOALS: Target date: On the 16th PT visit  Pt will increase by at least 40 ft in order to demonstrate clinically significant improvement in community ambulation Baseline: 410 ft; 08/04/23: 581 ft Goal status: MET  2.  Patient will demonstrate increase in Tinetti Score by 2 points in order to demonstrate clinically significant improvement in balance and decreased risk for falls  Baseline: 18; 08/04/23: 24 Goal status: MET  3.  Pt will demonstrate increase in LE strength to 4-/5 to facilitate ease and safety in ambulation Baseline: 3-/5 Goal status: MET  4.  Pt will decrease 5TSTS by at least 3 seconds in order to demonstrate clinically significant improvement in LE strength  Baseline: 08/04/23: 6.11 sec Goal status: MET  5.  Pt will increase FOTO to at least 59 in order to demonstrate significant improvement in function related to  ADLs and ambulation Baseline: 49.8; 08/04/23: 65.16 Goal status: MET  PLAN:  PT FREQUENCY:  0  PT DURATION: other: 0  PLANNED INTERVENTIONS:  D/C from skilled PT. D/C to independent HEP .   Tish Frederickson. Kaylea Mounsey, PT, DPT, OCS Board-Certified Clinical Specialist in Orthopedic PT PT Compact Privilege # ([place]): [phone] T 08/04/2023, 1:49 PM   PHYSICAL THERAPY DISCHARGE SUMMARY  Visits from Start of Care: 8  Current functional level related to goals / functional outcomes: See above   Remaining deficits: See above   Education / Equipment: See above   Patient agrees to discharge. Patient goals were met. Patient is being discharged due to meeting the stated rehab goals.   Tish Frederickson. Kayleah Appleyard, PT, DPT, OCS Board-Certified Clinical Specialist in Orthopedic PT PT Compact Privilege # ([place]): [phone] T 08/04/2023, 1:49 PM

## 2023-08-09 ENCOUNTER — Encounter (HOSPITAL_COMMUNITY): Payer: Medicare Other

## 2023-08-11 ENCOUNTER — Encounter (HOSPITAL_COMMUNITY): Payer: Medicare Other

## 2023-08-16 ENCOUNTER — Encounter (HOSPITAL_COMMUNITY): Payer: Medicare Other

## 2023-08-18 ENCOUNTER — Encounter (HOSPITAL_COMMUNITY): Payer: Medicare Other

## 2023-08-22 DIAGNOSIS — M4316 Spondylolisthesis, lumbar region: Secondary | ICD-10-CM | POA: Diagnosis not present

## 2023-08-25 DIAGNOSIS — K5900 Constipation, unspecified: Secondary | ICD-10-CM | POA: Diagnosis not present

## 2023-08-29 ENCOUNTER — Ambulatory Visit: Payer: 59 | Admitting: Urology

## 2023-08-30 ENCOUNTER — Ambulatory Visit: Payer: 59 | Admitting: Urology

## 2023-08-30 ENCOUNTER — Encounter: Payer: Self-pay | Admitting: Urology

## 2023-08-30 VITALS — BP 138/79 | HR 65

## 2023-08-30 DIAGNOSIS — N3289 Other specified disorders of bladder: Secondary | ICD-10-CM | POA: Diagnosis not present

## 2023-08-30 DIAGNOSIS — R351 Nocturia: Secondary | ICD-10-CM | POA: Diagnosis not present

## 2023-08-30 DIAGNOSIS — N401 Enlarged prostate with lower urinary tract symptoms: Secondary | ICD-10-CM | POA: Diagnosis not present

## 2023-08-30 DIAGNOSIS — N138 Other obstructive and reflux uropathy: Secondary | ICD-10-CM | POA: Diagnosis not present

## 2023-08-30 LAB — URINALYSIS, ROUTINE W REFLEX MICROSCOPIC
Bilirubin, UA: NEGATIVE
Glucose, UA: NEGATIVE
Ketones, UA: NEGATIVE
Leukocytes,UA: NEGATIVE
Nitrite, UA: NEGATIVE
Protein,UA: NEGATIVE
RBC, UA: NEGATIVE
Specific Gravity, UA: 1.015 (ref 1.005–1.030)
Urobilinogen, Ur: 1 mg/dL (ref 0.2–1.0)
pH, UA: 7 (ref 5.0–7.5)

## 2023-08-30 MED ORDER — ALFUZOSIN HCL ER 10 MG PO TB24: 10.0000 mg | ORAL_TABLET | Freq: Every day | ORAL | 11 refills | Status: DC

## 2023-08-30 NOTE — Progress Notes (Signed)
 08/30/2023 1:52 PM   Patrick Mathews 12-Jul-1950 [id number]  Referring provider: Assunta Found, MD [address] [place],  [place] [address]  Difficulty urinating   HPI: [name] is a 73yo here for evaluation of difficulty urinating. IPSS 7 QOL 1 on no BPH. For the past past 6-12 months he has noted a worsening stream, urinary urgency, nocturia. He has issues with constipation and less often diarrhea. He underwent CT 07/20/23 which showed bilateral small renal calculi, a thick walled bladder. He has nocturia. He had bilateral 1-53mm renal calculi.    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   TTE 05/2021: 41 mm  CT 05/2021: 39 mm    Asthma    CAD (coronary artery disease)    Exertional dyspnea    GERD (gastroesophageal reflux disease)    Hyperlipidemia    Hypertension    Neuropathy    Primary localized osteoarthritis of left knee    Primary localized osteoarthritis of right knee 05/04/2020   Seasonal allergies     Surgical History: Past Surgical History:  Procedure Laterality Date   ANTERIOR CERVICAL DECOMP/DISCECTOMY FUSION  1999   Dr Charna Busman SURGERY     BUNIONECTOMY Right 2013   COLONOSCOPY N/A 11/06/2012   Procedure: COLONOSCOPY;  Surgeon: Dalia Heading, MD;  Location: [facility];  Service: Gastroenterology;  Laterality: N/A;   FOOT SURGERY Right 2013   bunionectomy   KNEE ARTHROSCOPY Bilateral 1990's   POSTERIOR LUMBAR FUSION  1985; 2009   Dr Jeral Fruit   TONSILLECTOMY  1950's   TOTAL HIP ARTHROPLASTY Right 12/07/2021   Procedure: TOTAL HIP ARTHROPLASTY ANTERIOR APPROACH;   Surgeon: Sheral Apley, MD;  Location: [facility];  Service: Orthopedics;  Laterality: Right;   TOTAL KNEE ARTHROPLASTY Left 07/14/2014   Procedure: LEFT TOTAL KNEE ARTHROPLASTY;  Surgeon: Nilda Simmer, MD;  Location: [facility];  Service: Orthopedics;  Laterality: Left;   TOTAL KNEE ARTHROPLASTY Right 05/11/2020   Procedure: TOTAL KNEE ARTHROPLASTY;  Surgeon: Salvatore Marvel, MD;  Location: [facility];  Service: Orthopedics;  Laterality: Right;   WISDOM TOOTH EXTRACTION      Home Medications:  Allergies as of 08/30/2023       Reactions   Sulfa Antibiotics Hives, Rash   Morphine And Codeine Nausea And Vomiting   Oxycodone Hcl Other (See Comments)   Pt feels loopy        Medication List        Accurate as of August 30, 2023  1:52 PM. If you have any questions, ask your nurse or doctor.          acetaminophen 500 MG tablet Commonly known as: TYLENOL Take 2 tablets (1,000 mg total) by mouth every 6 (six) hours as needed for mild pain or moderate pain.   aspirin EC 81 MG tablet Take 81 mg by mouth daily. Start after you have your surgery   calcium carbonate 500 MG chewable tablet Commonly known as: TUMS - dosed in mg elemental calcium Chew 1-2 tablets by mouth daily as needed for indigestion or heartburn.  cholecalciferol 25 MCG (1000 UNIT) tablet Commonly known as: VITAMIN D3 Take 1,000 Units by mouth 3 (three) times a week.   cyclobenzaprine 10 MG tablet Commonly known as: FLEXERIL Take 1 tablet (10 mg total) by mouth 3 (three) times daily as needed for muscle spasms.   docusate sodium 100 MG capsule Commonly known as: COLACE Take 1 capsule (100 mg total) by mouth 2 (two) times daily.   gabapentin 600 MG tablet Commonly known as: NEURONTIN Take 1,200 mg by mouth at bedtime.   guaiFENesin 600 MG 12 hr tablet Commonly known as: Mucinex Take 1 tablet (600 mg total) by mouth 2 (two) times daily.   HYDROmorphone 2 MG tablet Commonly known as: DILAUDID Take 1-2 tablets  (2-4 mg total) by mouth every 4 (four) hours as needed for moderate pain (pain score 4-6).   losartan 25 MG tablet Commonly known as: COZAAR Take 1 tablet (25 mg total) by mouth daily. What changed: when to take this   ondansetron 8 MG tablet Commonly known as: ZOFRAN Take 8 mg by mouth every 8 (eight) hours as needed for nausea or vomiting.   pantoprazole 40 MG tablet Commonly known as: PROTONIX Take 40 mg by mouth daily.   polyethylene glycol 17 g packet Commonly known as: MIRALAX / GLYCOLAX Take 17 g by mouth daily.   promethazine-dextromethorphan 6.25-15 MG/5ML syrup Commonly known as: PROMETHAZINE-DM Take 5 mLs by mouth 4 (four) times daily as needed.   sildenafil 100 MG tablet Commonly known as: VIAGRA Take 100 mg by mouth daily as needed for erectile dysfunction.   Ventolin HFA 108 (90 Base) MCG/ACT inhaler Generic drug: albuterol Inhale 1-2 puffs into the lungs every 6 (six) hours as needed for wheezing or shortness of breath.        Allergies:  Allergies  Allergen Reactions   Sulfa Antibiotics Hives and Rash   Morphine And Codeine Nausea And Vomiting   Oxycodone Hcl Other (See Comments)    Pt feels loopy    Family History: Family History  Problem Relation Age of Onset   CAD Mother    CVA Mother    Alzheimer's disease Mother    Dementia Father    Alzheimer's disease Father    Stroke Maternal Grandmother    Heart attack Maternal Grandfather    CAD Maternal Grandfather    Stroke Paternal Grandmother    Colon cancer Neg Hx    Colon polyps Neg Hx     Social History:  reports that he quit smoking about 4 years ago. His smoking use included cigars. He has never used smokeless tobacco. He reports current alcohol use. He reports that he does not use drugs.  ROS: All other review of systems were reviewed and are negative except what is noted above in HPI  Physical Exam: BP 138/79   Pulse 65   Constitutional:  Alert and oriented, No acute  distress. HEENT: [place] AT, moist mucus membranes.  Trachea midline, no masses. Cardiovascular: No clubbing, cyanosis, or edema. Respiratory: Normal respiratory effort, no increased work of breathing. GI: Abdomen is soft, nontender, nondistended, no abdominal masses GU: No CVA tenderness. Circumcised phallus. No masses/lesions on penis, testis, scrotum. Prostate 40g smooth no nodules no induration.  Lymph: No cervical or inguinal lymphadenopathy. Skin: No rashes, bruises or suspicious lesions. Neurologic: Grossly intact, no focal deficits, moving all 4 extremities. Psychiatric: Normal mood and affect.  Laboratory Data: Lab Results  Component Value Date   WBC 5.9 04/20/2023   HGB 15.7 04/20/2023  HCT 46.6 04/20/2023   MCV 91.4 04/20/2023   PLT 193 04/20/2023    Lab Results  Component Value Date   CREATININE 1.50 (H) 07/20/2023    No results found for: "PSA"  No results found for: "TESTOSTERONE"  No results found for: "HGBA1C"  Urinalysis    Component Value Date/Time   COLORURINE STRAW (A) 07/17/2020 0737   APPEARANCEUR CLEAR 07/17/2020 0737   LABSPEC 1.005 07/17/2020 0737   PHURINE 6.0 07/17/2020 0737   GLUCOSEU NEGATIVE 07/17/2020 0737   HGBUR NEGATIVE 07/17/2020 0737   BILIRUBINUR NEGATIVE 07/17/2020 0737   KETONESUR NEGATIVE 07/17/2020 0737   PROTEINUR NEGATIVE 07/17/2020 0737   UROBILINOGEN 0.2 07/03/2014 1011   NITRITE NEGATIVE 07/17/2020 0737   LEUKOCYTESUR NEGATIVE 07/17/2020 0737    No results found for: "LABMICR", "WBCUA", "RBCUA", "LABEPIT", "MUCUS", "BACTERIA"  Pertinent Imaging: Ct 07/20/2023: Images reviewed and discussed with the patient  Results for orders placed during the hospital encounter of 05/25/20  DG Abdomen 1 View  Narrative CLINICAL DATA:  Constipation, nausea  EXAM: ABDOMEN - 1 VIEW  COMPARISON:  None.  FINDINGS: Nonobstructive pattern of bowel gas with gas present to the rectum. No significant burden of stool in the colon. No  free air in the abdomen on supine radiographs. No radio-opaque calculi or other significant radiographic abnormality are seen.  IMPRESSION: Nonobstructive pattern of bowel gas with gas present to the rectum. No significant burden of stool in the colon. No free air in the abdomen on supine radiographs.   Electronically Signed By: Lauralyn Primes M.D. On: 05/25/2020 17:54  No results found for this or any previous visit.  No results found for this or any previous visit.  No results found for this or any previous visit.  No results found for this or any previous visit.  No results found for this or any previous visit.  No results found for this or any previous visit.  Results for orders placed during the hospital encounter of 07/17/20  CT Renal Stone Study  Narrative CLINICAL DATA:  Flank pain with kidney stone suspected. Mid abdominal pain for several months  EXAM: CT ABDOMEN AND PELVIS WITHOUT CONTRAST  TECHNIQUE: Multidetector CT imaging of the abdomen and pelvis was performed following the standard protocol without IV contrast.  COMPARISON:  06/10/2020  FINDINGS: Lower chest:  No contributory findings.  Hepatobiliary: 4.1 cm right hepatic cyst.No evidence of biliary obstruction or stone.  Pancreas: Unremarkable.  Spleen: Unremarkable.  Adrenals/Urinary Tract: Negative adrenals. No hydronephrosis or ureteral stone. Renal sinus cysts as confirmed on prior enhanced CT. Four right renal calculi measuring up to 3 mm. Two punctate left upper pole renal calculi. 37 mm left lower pole cyst. Unremarkable bladder.  Stomach/Bowel: No obstruction. No visible bowel inflammation. Left colonic diverticulosis.  Vascular/Lymphatic: No acute vascular abnormality. Scattered atheromatous calcifications. No mass or adenopathy.  Reproductive:Partially covered presumed left hydrocele.  Other: No ascites or pneumoperitoneum.  Musculoskeletal: No acute abnormalities. L2-L5  PLIF with solid arthrodesis. L5-S1 facet osteoarthritis with anterolisthesis and ankylosis. Advanced disc degeneration above the fusion.  IMPRESSION: 1. No acute finding. 2. Bilateral nonobstructing renal calculi.   Electronically Signed By: Marnee Spring M.D. On: 07/17/2020 09:10   Assessment & Plan:    1. Bladder wall thicke[place]ening (Primary) -likely related to bladder outlet obstruction - Urinalysis, Routine w reflex microscopic  2. Benign prostatic hyperplasia with urinary obstruction We will trial uroxatral 10mg  qhs  3. Nocturia We will trial uroxatral 10mg  qhs   No follow-ups on  file.  Wilkie Aye, MD  [facility] [place]

## 2023-08-30 NOTE — Patient Instructions (Signed)

## 2023-09-08 DIAGNOSIS — I872 Venous insufficiency (chronic) (peripheral): Secondary | ICD-10-CM | POA: Diagnosis not present

## 2023-09-08 DIAGNOSIS — L308 Other specified dermatitis: Secondary | ICD-10-CM | POA: Diagnosis not present

## 2023-09-09 ENCOUNTER — Ambulatory Visit
Admission: EM | Admit: 2023-09-09 | Discharge: 2023-09-09 | Disposition: A | Discharge status: Home / Self Care | Attending: Nurse Practitioner | Admitting: Nurse Practitioner

## 2023-09-09 ENCOUNTER — Emergency Department (HOSPITAL_COMMUNITY)
Admission: EM | Admit: 2023-09-09 | Discharge: 2023-09-09 | Disposition: A | Discharge status: Home / Self Care | Attending: Emergency Medicine | Admitting: Emergency Medicine

## 2023-09-09 ENCOUNTER — Emergency Department (HOSPITAL_COMMUNITY)

## 2023-09-09 ENCOUNTER — Encounter (HOSPITAL_COMMUNITY): Payer: Self-pay | Admitting: *Deleted

## 2023-09-09 ENCOUNTER — Other Ambulatory Visit: Payer: Self-pay

## 2023-09-09 ENCOUNTER — Encounter: Payer: Self-pay | Admitting: Emergency Medicine

## 2023-09-09 DIAGNOSIS — Z7982 Long term (current) use of aspirin: Secondary | ICD-10-CM | POA: Insufficient documentation

## 2023-09-09 DIAGNOSIS — R0602 Shortness of breath: Secondary | ICD-10-CM | POA: Diagnosis not present

## 2023-09-09 DIAGNOSIS — R079 Chest pain, unspecified: Secondary | ICD-10-CM

## 2023-09-09 DIAGNOSIS — R600 Localized edema: Secondary | ICD-10-CM | POA: Diagnosis not present

## 2023-09-09 DIAGNOSIS — K5730 Diverticulosis of large intestine without perforation or abscess without bleeding: Secondary | ICD-10-CM | POA: Diagnosis not present

## 2023-09-09 DIAGNOSIS — E041 Nontoxic single thyroid nodule: Secondary | ICD-10-CM | POA: Diagnosis not present

## 2023-09-09 DIAGNOSIS — Z8616 Personal history of COVID-19: Secondary | ICD-10-CM | POA: Insufficient documentation

## 2023-09-09 DIAGNOSIS — J9811 Atelectasis: Secondary | ICD-10-CM | POA: Diagnosis not present

## 2023-09-09 DIAGNOSIS — R601 Generalized edema: Secondary | ICD-10-CM

## 2023-09-09 DIAGNOSIS — I2510 Atherosclerotic heart disease of native coronary artery without angina pectoris: Secondary | ICD-10-CM | POA: Diagnosis not present

## 2023-09-09 DIAGNOSIS — R109 Unspecified abdominal pain: Secondary | ICD-10-CM | POA: Insufficient documentation

## 2023-09-09 DIAGNOSIS — N200 Calculus of kidney: Secondary | ICD-10-CM | POA: Diagnosis not present

## 2023-09-09 DIAGNOSIS — K7689 Other specified diseases of liver: Secondary | ICD-10-CM | POA: Diagnosis not present

## 2023-09-09 DIAGNOSIS — I700 Atherosclerosis of aorta: Secondary | ICD-10-CM | POA: Diagnosis not present

## 2023-09-09 LAB — CBC WITH DIFFERENTIAL/PLATELET
Abs Immature Granulocytes: 0.01 10*3/uL (ref 0.00–0.07)
Basophils Absolute: 0 10*3/uL (ref 0.0–0.1)
Basophils Relative: 0 %
Eosinophils Absolute: 0.1 10*3/uL (ref 0.0–0.5)
Eosinophils Relative: 2 %
HCT: 45.9 % (ref 39.0–52.0)
Hemoglobin: 15.4 g/dL (ref 13.0–17.0)
Immature Granulocytes: 0 %
Lymphocytes Relative: 18 %
Lymphs Abs: 0.9 10*3/uL (ref 0.7–4.0)
MCH: 30.4 pg (ref 26.0–34.0)
MCHC: 33.6 g/dL (ref 30.0–36.0)
MCV: 90.5 fL (ref 80.0–100.0)
Monocytes Absolute: 0.5 10*3/uL (ref 0.1–1.0)
Monocytes Relative: 10 %
Neutro Abs: 3.3 10*3/uL (ref 1.7–7.7)
Neutrophils Relative %: 70 %
Platelets: 172 10*3/uL (ref 150–400)
RBC: 5.07 MIL/uL (ref 4.22–5.81)
RDW: 13.5 % (ref 11.5–15.5)
WBC: 4.7 10*3/uL (ref 4.0–10.5)
nRBC: 0 % (ref 0.0–0.2)

## 2023-09-09 LAB — URINALYSIS, ROUTINE W REFLEX MICROSCOPIC
Bilirubin Urine: NEGATIVE
Glucose, UA: NEGATIVE mg/dL
Hgb urine dipstick: NEGATIVE
Ketones, ur: NEGATIVE mg/dL
Leukocytes,Ua: NEGATIVE
Nitrite: NEGATIVE
Protein, ur: NEGATIVE mg/dL
Specific Gravity, Urine: 1.01 (ref 1.005–1.030)
pH: 6 (ref 5.0–8.0)

## 2023-09-09 LAB — BASIC METABOLIC PANEL WITH GFR
Anion gap: 7 (ref 5–15)
BUN: 20 mg/dL (ref 8–23)
CO2: 25 mmol/L (ref 22–32)
Calcium: 9.4 mg/dL (ref 8.9–10.3)
Chloride: 106 mmol/L (ref 98–111)
Creatinine, Ser: 1.31 mg/dL — ABNORMAL HIGH (ref 0.61–1.24)
GFR, Estimated: 57 mL/min — ABNORMAL LOW (ref >60)
Glucose, Bld: 109 mg/dL — ABNORMAL HIGH (ref 70–99)
Potassium: 4.2 mmol/L (ref 3.5–5.1)
Sodium: 138 mmol/L (ref 135–145)

## 2023-09-09 LAB — BRAIN NATRIURETIC PEPTIDE: B Natriuretic Peptide: 65 pg/mL (ref 0.0–100.0)

## 2023-09-09 LAB — HEPATIC FUNCTION PANEL
ALT: 25 U/L (ref 0–44)
AST: 26 U/L (ref 15–41)
Albumin: 3.9 g/dL (ref 3.5–5.0)
Alkaline Phosphatase: 103 U/L (ref 38–126)
Bilirubin, Direct: 0.1 mg/dL (ref 0.0–0.2)
Indirect Bilirubin: 0.8 mg/dL (ref 0.3–0.9)
Total Bilirubin: 0.9 mg/dL (ref 0.0–1.2)
Total Protein: 7.2 g/dL (ref 6.5–8.1)

## 2023-09-09 LAB — TROPONIN I (HIGH SENSITIVITY)
Troponin I (High Sensitivity): 4 ng/L (ref <18)
Troponin I (High Sensitivity): 4 ng/L (ref <18)

## 2023-09-09 MED ORDER — FUROSEMIDE 10 MG/ML IJ SOLN
20.0000 mg | Freq: Once | INTRAMUSCULAR | Status: AC
  Administered 2023-09-09: 20 mg via INTRAVENOUS
  Filled 2023-09-09: qty 2

## 2023-09-09 MED ORDER — IOHEXOL 350 MG/ML SOLN
75.0000 mL | Freq: Once | INTRAVENOUS | Status: AC | PRN
  Administered 2023-09-09: 75 mL via INTRAVENOUS

## 2023-09-09 MED ORDER — FUROSEMIDE 40 MG PO TABS
40.0000 mg | ORAL_TABLET | Freq: Every day | ORAL | 0 refills | Status: DC
Start: 2023-09-09 — End: 2024-01-22

## 2023-09-09 NOTE — ED Notes (Signed)
 Patient is being discharged from the Urgent Care and sent to the Emergency Department via POV . Per provider, patient is in need of higher level of care due to hypoxia and chest pain. Patient is aware and verbalizes understanding of plan of care.  Vitals:   09/09/23 1042  BP: 128/62  Pulse: 75  Resp: 20  Temp: 97.8 F (36.6 C)  SpO2: 96%

## 2023-09-09 NOTE — ED Triage Notes (Signed)
 SOB  off and on for a while along with leg swelling and ABD swelling.  Had dermatology appointment yesterday and was told he need to be seen.

## 2023-09-09 NOTE — ED Provider Notes (Signed)
 [facility] EMERGENCY DEPARTMENT AT [facility] Provider Note   CSN: [id number] Arrival date & time: 09/09/23  1109     History  Chief Complaint  Patient presents with   Shortness of Breath    Patrick Mathews is a 73 y.o. male.  Patient is a 73 year old male who presents to the emergency department with a chief complaint of shortness of breath which is worse with exertion, increased lower extremity edema, abdominal pain and abdominal bloating.  Patient notes that symptoms have become worse over the past few days.  He notes he has no known history of CHF.  He denies any direct chest pain or discomfort.  He denies any orthopnea.  He has had no associated fever, chills, nausea, vomiting, diarrhea.  He does note that he is recently had COVID but did recover from that.  He denies any dizziness, lightheadedness or syncope.   Shortness of Breath      Home Medications Prior to Admission medications   Medication Sig Start Date End Date Taking? Authorizing Provider  acetaminophen (TYLENOL) 500 MG tablet Take 2 tablets (1,000 mg total) by mouth every 6 (six) hours as needed for mild pain or moderate pain. 12/08/21   Jenne Pane, PA-C  alfuzosin (UROXATRAL) 10 MG 24 hr tablet Take 1 tablet (10 mg total) by mouth at bedtime. 08/30/23   McKenzie, Mardene Celeste, MD  aspirin EC 81 MG tablet Take 81 mg by mouth daily. Start after you have your surgery    [provider]  calcium carbonate (TUMS - DOSED IN MG ELEMENTAL CALCIUM) 500 MG chewable tablet Chew 1-2 tablets by mouth daily as needed for indigestion or heartburn.    [provider]  cholecalciferol (VITAMIN D3) 25 MCG (1000 UNIT) tablet Take 1,000 Units by mouth 3 (three) times a week.    [provider]  cyclobenzaprine (FLEXERIL) 10 MG tablet Take 1 tablet (10 mg total) by mouth 3 (three) times daily as needed for muscle spasms. 04/27/23   Tressie Stalker, MD  docusate sodium (COLACE) 100 MG capsule Take  1 capsule (100 mg total) by mouth 2 (two) times daily. 04/27/23   Tressie Stalker, MD  gabapentin (NEURONTIN) 600 MG tablet Take 1,200 mg by mouth at bedtime.    [provider]  HYDROmorphone (DILAUDID) 2 MG tablet Take 1-2 tablets (2-4 mg total) by mouth every 4 (four) hours as needed for moderate pain (pain score 4-6). 04/27/23   Tressie Stalker, MD  losartan (COZAAR) 25 MG tablet Take 1 tablet (25 mg total) by mouth daily. Patient taking differently: Take 25 mg by mouth every evening. 01/11/23   Nahser, Deloris Ping, MD  ondansetron (ZOFRAN) 8 MG tablet Take 8 mg by mouth every 8 (eight) hours as needed for nausea or vomiting.    [provider]  pantoprazole (PROTONIX) 40 MG tablet Take 40 mg by mouth daily.    [provider]  polyethylene glycol (MIRALAX / GLYCOLAX) 17 g packet Take 17 g by mouth daily.    [provider]  sildenafil (VIAGRA) 100 MG tablet Take 100 mg by mouth daily as needed for erectile dysfunction.  04/21/20   [provider]  VENTOLIN HFA 108 (90 Base) MCG/ACT inhaler Inhale 1-2 puffs into the lungs every 6 (six) hours as needed for wheezing or shortness of breath. 03/07/22   [provider]      Allergies    Sulfa antibiotics, Morphine and codeine, and Oxycodone hcl  Review of Systems   Review of Systems  Respiratory:  Positive for shortness of breath.   Cardiovascular:  Positive for leg swelling.  All other systems reviewed and are negative.   Physical Exam Updated Vital Signs Pulse 72   Resp 14   Ht 5\' 10"  (1.778 m)   SpO2 98%   BMI 30.42 kg/m  Physical Exam Vitals and nursing note reviewed.  Constitutional:      Appearance: Normal appearance.  HENT:     Head: Normocephalic and atraumatic.     Nose: Nose normal.     Mouth/Throat:     Mouth: Mucous membranes are moist.  Eyes:     Extraocular Movements: Extraocular movements intact.     Conjunctiva/sclera: Conjunctivae normal.     Pupils: Pupils  are equal, round, and reactive to light.  Cardiovascular:     Rate and Rhythm: Normal rate and regular rhythm.     Pulses: Normal pulses.     Heart sounds: Normal heart sounds.  Pulmonary:     Effort: Pulmonary effort is normal. No tachypnea or respiratory distress.     Breath sounds: Normal breath sounds. No decreased breath sounds, wheezing, rhonchi or rales.  Chest:     Chest wall: No tenderness.  Abdominal:     General: Abdomen is flat. Bowel sounds are normal.     Palpations: Abdomen is soft.     Tenderness: There is no guarding or rebound.     Comments: Mild epigastric tenderness  Musculoskeletal:        General: Normal range of motion.     Cervical back: Normal range of motion and neck supple.     Right lower leg: Edema present.     Left lower leg: Edema present.  Skin:    General: Skin is warm and dry.     Findings: No ecchymosis, erythema or rash.  Neurological:     General: No focal deficit present.     Mental Status: He is alert and oriented to person, place, and time. Mental status is at baseline.     Cranial Nerves: No cranial nerve deficit.     Motor: No weakness.  Psychiatric:        Mood and Affect: Mood normal.        Behavior: Behavior normal.        Thought Content: Thought content normal.        Judgment: Judgment normal.     ED Results / Procedures / Treatments   Labs (all labs ordered are listed, but only abnormal results are displayed) Labs Reviewed  CBC WITH DIFFERENTIAL/PLATELET  BASIC METABOLIC PANEL WITH GFR  BRAIN NATRIURETIC PEPTIDE  HEPATIC FUNCTION PANEL  TROPONIN I (HIGH SENSITIVITY)    EKG None  Radiology No results found.  Procedures Procedures    Medications Ordered in ED Medications  furosemide (LASIX) injection 20 mg (has no administration in time range)    ED Course/ Medical Decision Making/ A&P                                 Medical Decision Making Amount and/or Complexity of Data Reviewed Labs:  ordered. Radiology: ordered.  Risk Prescription drug management.   This patient presents to the ED for concern of edema, shortness of breath, abdominal pain differential diagnosis includes CHF, nephrotic syndrome, ACS, pulmonary embolus, acute cholecystitis, pancreatitis, appendicitis, small bowel obstruction, diverticulitis, pyelonephritis, kidney stone    Additional  history obtained:  Additional history obtained from spouse External records from outside source obtained and reviewed including none   Lab Tests:  I Ordered, and personally interpreted labs.  The pertinent results include: Mild elevation in creatinine, normal electrolytes, normal liver function, normal serial troponins, normal BNP, negative urinalysis   Imaging Studies ordered:  I ordered imaging studies including CT scan of chest, abdomen and pelvis I independently visualized and interpreted imaging which showed no acute cardiopulmonary process or acute intra-abdominal surgical process I agree with the radiologist interpretation   Medicines ordered and prescription drug management:  I ordered medication including Lasix for edema Reevaluation of the patient after these medicines showed that the patient improved I have reviewed the patients home medicines and have made adjustments as needed   Problem List / ED Course:  Patient is doing very well at this time and is stable for discharge home.  Patient notes that symptoms have greatly improved with IV Lasix in the emergency department.  Patient was made aware of the thyroid nodule and provided with a copy of his CT scan report.  He will follow-up with his primary care doctor regarding this for repeat imaging.  Patient was directed follow-up closely with his cardiologist on an outpatient basis for reevaluation as well.  Will treat with a short course of Lasix at this time.  Blood work has otherwise been unremarkable.  Do not suspect acute CHF or nephrotic syndrome.  He  had no indication for pulmonary embolus.  There was no acute surgical pathology noted within the abdomen and pelvis.  Strict turn precautions were discussed for any new or worsening symptoms.  Patient voiced understanding and had no additional questions.   Social Determinants of Health:  None           Final Clinical Impression(s) / ED Diagnoses Final diagnoses:  None    Rx / DC Orders ED Discharge Orders     None         Kathlen Mody 09/09/23 1439    Gloris Manchester, MD 09/09/23 1526

## 2023-09-09 NOTE — Discharge Instructions (Signed)
 Go to the emergency department for further evaluation.

## 2023-09-09 NOTE — ED Provider Notes (Signed)
 [facility]    CSN: [id number] Arrival date & time: 09/09/23  1035      History   Chief Complaint Chief Complaint  Patient presents with   Shortness of Breath   leg and stomach swelling    HPI Patrick Mathews is a 73 y.o. male.   The history is provided by the patient.   Patient presents for complaints of shortness of breath and intermittent chest pain with lower extremity edema.  Patient states he has had noticed worsening shortness of breath over the past several days.  States his shortness of breath is more prominent when he is at rest.  States that he was at his dermatologist appointment yesterday, and she noticed that he had increased swelling in his legs and feet.  Patient states that he also feels that his abdomen is "bloated and pushing up" which is affecting his breathing.  Patient denies dizziness, lightheadedness, nausea, vomiting, diarrhea, or diaphoresis.  Patient with underlying history of CAD.  States that he does see a cardiologist.  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   TTE 05/2021: 41 mm  CT 05/2021: 39 mm    Asthma    CAD (coronary artery disease)    Exertional dyspnea    GERD (gastroesophageal reflux disease)    Hyperlipidemia    Hypertension    Neuropathy    Primary localized osteoarthritis of left knee    Primary localized osteoarthritis of right knee 05/04/2020   Seasonal allergies     Patient Active Problem List   Diagnosis Date Noted   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   Preoperative  cardiovascular examination 02/21/2023   Aortic insufficiency 02/21/2023   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   Essential hypertension 02/21/2023   History of lumbar fusion 08/09/2022   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   CAD (coronary artery disease) 08/16/2021   DOE (dyspnea on exertion) 08/16/2021   Tremor 04/27/2021   Gait abnormality 04/27/2021   Primary localized osteoarthritis of right knee 05/04/2020   Lumbar adjacent segment disease with spondylolisthesis 02/06/2020   Chest pressure 09/27/2019   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   Neck pain 06/23/2015   Morbid obesity (HCC) 05/08/2015   DJD (degenerative joint disease) of knee 07/14/2014   Acute upper respiratory infection 06/12/2014   Cough 06/12/2014   Primary localized osteoarthritis of left knee    Hyperlipidemia    GERD (gastroesophageal reflux disease)    Back pain    Dyspnea 05/07/2014   Multiple pulmonary nodules 05/07/2014   Thoracic radiculitis 08/19/2013    Past Surgical History:  Procedure Laterality Date   ANTERIOR CERVICAL DECOMP/DISCECTOMY FUSION  1999   Dr Charna Busman SURGERY     BUNIONECTOMY Right 2013   COLONOSCOPY N/A 11/06/2012   Procedure: COLONOSCOPY;  Surgeon: Dalia Heading, MD;  Location: [facility];  Service: Gastroenterology;  Laterality: N/A;   FOOT SURGERY Right 2013   bunionectomy   KNEE ARTHROSCOPY Bilateral 1990's   POSTERIOR LUMBAR FUSION  1985;  2009   Dr Jeral Fruit   TONSILLECTOMY  1950's   TOTAL HIP ARTHROPLASTY Right 12/07/2021   Procedure: TOTAL HIP ARTHROPLASTY ANTERIOR APPROACH;  Surgeon: Sheral Apley, MD;  Location: [facility];  Service: Orthopedics;  Laterality: Right;   TOTAL KNEE ARTHROPLASTY Left 07/14/2014   Procedure: LEFT TOTAL KNEE ARTHROPLASTY;  Surgeon: Nilda Simmer, MD;  Location: [facility];  Service: Orthopedics;  Laterality: Left;   TOTAL KNEE ARTHROPLASTY Right 05/11/2020   Procedure: TOTAL KNEE  ARTHROPLASTY;  Surgeon: Salvatore Marvel, MD;  Location: [facility];  Service: Orthopedics;  Laterality: Right;   WISDOM TOOTH EXTRACTION         Home Medications    Prior to Admission medications   Medication Sig Start Date End Date Taking? Authorizing Provider  acetaminophen (TYLENOL) 500 MG tablet Take 2 tablets (1,000 mg total) by mouth every 6 (six) hours as needed for mild pain or moderate pain. 12/08/21   Jenne Pane, PA-C  alfuzosin (UROXATRAL) 10 MG 24 hr tablet Take 1 tablet (10 mg total) by mouth at bedtime. 08/30/23   McKenzie, Mardene Celeste, MD  aspirin EC 81 MG tablet Take 81 mg by mouth daily. Start after you have your surgery    [provider]  calcium carbonate (TUMS - DOSED IN MG ELEMENTAL CALCIUM) 500 MG chewable tablet Chew 1-2 tablets by mouth daily as needed for indigestion or heartburn.    [provider]  cholecalciferol (VITAMIN D3) 25 MCG (1000 UNIT) tablet Take 1,000 Units by mouth 3 (three) times a week.    [provider]  cyclobenzaprine (FLEXERIL) 10 MG tablet Take 1 tablet (10 mg total) by mouth 3 (three) times daily as needed for muscle spasms. 04/27/23   Tressie Stalker, MD  docusate sodium (COLACE) 100 MG capsule Take 1 capsule (100 mg total) by mouth 2 (two) times daily. 04/27/23   Tressie Stalker, MD  gabapentin (NEURONTIN) 600 MG tablet Take 1,200 mg by mouth at bedtime.    [provider]  HYDROmorphone (DILAUDID) 2 MG tablet Take 1-2 tablets (2-4 mg total) by mouth every 4 (four) hours as needed for moderate pain (pain score 4-6). 04/27/23   Tressie Stalker, MD  losartan (COZAAR) 25 MG tablet Take 1 tablet (25 mg total) by mouth daily. Patient taking differently: Take 25 mg by mouth every evening. 01/11/23   Nahser, Deloris Ping, MD  ondansetron (ZOFRAN) 8 MG tablet Take 8 mg by mouth every 8 (eight) hours as needed for nausea or vomiting.    [provider]  pantoprazole (PROTONIX) 40 MG tablet Take 40 mg by mouth  daily.    [provider]  polyethylene glycol (MIRALAX / GLYCOLAX) 17 g packet Take 17 g by mouth daily.    [provider]  sildenafil (VIAGRA) 100 MG tablet Take 100 mg by mouth daily as needed for erectile dysfunction.  04/21/20   [provider]  VENTOLIN HFA 108 (90 Base) MCG/ACT inhaler Inhale 1-2 puffs into the lungs every 6 (six) hours as needed for wheezing or shortness of breath. 03/07/22   [provider]    Family History Family History  Problem Relation Age of Onset   CAD Mother    CVA Mother    Alzheimer's disease Mother    Dementia Father    Alzheimer's disease Father    Stroke Maternal Grandmother    Heart attack Maternal Grandfather    CAD Maternal Grandfather    Stroke Paternal Grandmother    Colon  cancer Neg Hx    Colon polyps Neg Hx     Social History Social History   Tobacco Use   Smoking status: Former    Types: Cigars    Quit date: 2021    Years since quitting: 4.2   Smokeless tobacco: Never   Tobacco comments:    07/14/2014 smoked cigs on and off x 40 yrs- none since 2014, but does smoke occ cigar-  Vaping Use   Vaping status: Never Used  Substance Use Topics   Alcohol use: Yes    Alcohol/week: 0.0 standard drinks of alcohol    Comment: occ   Drug use: No     Allergies   Sulfa antibiotics, Morphine and codeine, and Oxycodone hcl   Review of Systems Review of Systems Per HPI  Physical Exam Triage Vital Signs ED Triage Vitals [09/09/23 1042]  Encounter Vitals Group     BP 128/62     Systolic BP Percentile      Diastolic BP Percentile      Pulse Rate 75     Resp 20     Temp 97.8 F (36.6 C)     Temp Source Oral     SpO2 96 %     Weight      Height      Head Circumference      Peak Flow      Pain Score      Pain Loc      Pain Education      Exclude from Growth Chart    No data found.  Updated Vital Signs BP 128/62 (BP Location: Right Arm)   Pulse 75   Temp 97.8 F (36.6 C) (Oral)    Resp 20   SpO2 96%   Visual Acuity Right Eye Distance:   Left Eye Distance:   Bilateral Distance:    Right Eye Near:   Left Eye Near:    Bilateral Near:     Physical Exam Vitals and nursing note reviewed.  Constitutional:      General: He is not in acute distress.    Appearance: He is well-developed.  HENT:     Head: Normocephalic.  Eyes:     Extraocular Movements: Extraocular movements intact.     Pupils: Pupils are equal, round, and reactive to light.  Cardiovascular:     Rate and Rhythm: Normal rate and regular rhythm.     Comments: Lower extremity edema greater in the left extremity. Pulmonary:     Effort: Pulmonary effort is normal. No tachypnea or respiratory distress.     Breath sounds: Normal breath sounds.  Abdominal:     General: Bowel sounds are normal.     Palpations: Abdomen is soft.  Musculoskeletal:     Cervical back: Normal range of motion.     Right lower leg: Edema present.     Left lower leg: Edema present.  Skin:    General: Skin is warm and dry.  Neurological:     General: No focal deficit present.     Mental Status: He is alert and oriented to person, place, and time.  Psychiatric:        Mood and Affect: Mood normal.        Behavior: Behavior normal.      UC Treatments / Results  Labs (all labs ordered are listed, but only abnormal results are displayed) Labs Reviewed - No data to display  EKG   Radiology No results found.  Procedures Procedures (including  critical care time)  Medications Ordered in UC Medications - No data to display  Initial Impression / Assessment and Plan / UC Course  I have reviewed the triage vital signs and the nursing notes.  Pertinent labs & imaging results that were available during my care of the patient were reviewed by me and considered in my medical decision making (see chart for details).  EKG performed which shows sinus rhythm with PVCs, and RBBB, no STEMI.  Patient with shortness of breath  that is been present for the past several days.  Patient reports shortness of breath is worse when he is at rest.  Also complains of abdominal discomfort stating that it feels like it is affecting his breathing.  Patient with underlying history of heart disease.  Given patient's history, and current complaints, advised patient that it is recommended that he go to the emergency department for further evaluation.  Patient's vital signs are stable, he is able to travel via private vehicle.  Patient and his significant other are in agreement with this plan of care and verbalized understanding.  Patient discharged to the emergency department.  Patient ambulatory at discharge.  Final Clinical Impressions(s) / UC Diagnoses   Final diagnoses:  None   Discharge Instructions   None    ED Prescriptions   None    PDMP not reviewed this encounter.   Abran Cantor, NP 09/09/23 1106

## 2023-09-09 NOTE — ED Notes (Signed)
 Patient transported to CT

## 2023-09-09 NOTE — Discharge Instructions (Signed)
 Please follow-up closely with your cardiologist on an outpatient basis.  Return to emergency department immediately for any new or worsening symptoms.

## 2023-09-09 NOTE — ED Triage Notes (Signed)
 Pt with SOB for awhile, went to be seen at [facility] and was sent here. Denies CP at present.  + abd bloating and bilateral ankle swelling-left > right.  Wife states he had irregular heartbeat yesterday.

## 2023-09-11 ENCOUNTER — Telehealth: Payer: Self-pay | Admitting: Cardiovascular Disease

## 2023-09-11 ENCOUNTER — Ambulatory Visit (HOSPITAL_COMMUNITY)

## 2023-09-11 NOTE — Telephone Encounter (Signed)
 Spoke with patient and he states he went to the ED over the weekend for swelling and SOB. He states he was advised to follow up with cardiology ASAP.  Appointment scheduled  He states he is still SOB and he still has swelling. Advised to continue taking lasix and following instructions form ED. Informed patient he can elevate legs as much as possible when sitting down, try compression stockings and monitoring salt intake. Denis any chest pain, headache, nausea or vomiting. ED precautions discussed

## 2023-09-11 NOTE — Telephone Encounter (Signed)
 Pt c/o Shortness Of Breath: STAT if SOB developed within the last 24 hours or pt is noticeably SOB on the phone  1. Are you currently SOB (can you hear that pt is SOB on the phone)? Yes, he said a little  2. How long have you been experiencing SOB? About 2 months, but seems to be getting worse  3. Are you SOB when sitting or when up moving around? Moving around  4. Are you currently experiencing any other symptoms? Lightheaded at times, some times, he feels like he is going to fall, get fatigued easily and lost weight

## 2023-09-14 NOTE — Progress Notes (Unsigned)
 Cardiology Office Note:    Date:  09/15/2023   ID:  Patrick Mathews, DOB 26-Dec-1950, MRN [id number]  PCP:  Assunta Found, MD  Cardiologist:  Arrick Dutton  Electrophysiologist:  None   Referring MD: Assunta Found, MD   No chief complaint on file.   History of Present Illness:    Patrick Mathews is a 73 y.o. male with a hx of  CP .    We were asked to see him today for this CP by Dr. Phillips Odor .    Hx of HLD.   CP / pressure for the past 6 months.  Active,  Golf, fishes, works out in the yard.  Walks 1-2 times a week,  Perhaps a mild  Has CP - frequently while in the recliner in the evenings.  Perhaps several hours after he has eaten dinner. Thinks the episodes may be slightly worsening recently. He describes the pain as a pressure-like sensation.  The discomfort is midsternal.  It does not radiate to his shoulders or neck or arm.  It is associated with some increased shortness of breath.  No diaphoresis no presyncope or syncope. Occurs serval times a week.  May last 3-4 minutes , then eases off   Just started rosuvastatin   September 27, 2019:  Patrick Mathews was seen several months ago with onset of chest pressure.  These episodes would last several minutes and then eased off.  He has a history of hyperlipidemia and was started on rosuvastatin several months ago. Stress Myoview study was low risk.  No evidence of ischemia and had normal left ventricular systolic function. No further CP ,  Remains active   August 16, 2021 Patrick Mathews is seen for follow up of his CP and HLD He was seen by Eligha Bridegroom, NP in Nov, 2022 for follow up of hhis HLD . He was having progrossive CP and DOE  He used to walk 2 miles a day but could not walk 100 yards without getting winded.   This seems to have improved over the past several months    Is having hip issues now that limit his walking   Coronary CTA: Coronary calcium score: The patient's coronary artery calcium score is 69, which places the patient in  the 38th percentile RCA : normal  LAD - mild CAD LCx - mild CAD   Echo: 05/25/21:   normal LV function, mild - mod AI, trivial MR , Trivial TR  Had COVID a year ago  Having issues with his stomach,   Has had 2 ortho surgeries and covid since he walked regularly . 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     September 15, 2023 Patrick Mathews is seen for follow up of his CAC, HLD Has had some leg swelling and dyspnea    Still eating a fair amount of salt   Went to [facility] last weekend for dyspnea  Is not getting any exercise  Has not felt like exercise  Had COVID again this past January  Echo from Sept. 2024 showes normal LV EF,  mild AI, mild dilatation of his ascending aorta   Cor CTA in Dec. 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   TTE 05/2021: 41 mm  CT 05/2021: 39 mm    Asthma    CAD (coronary artery disease)    Exertional dyspnea    GERD (gastroesophageal reflux disease)    Hyperlipidemia    Hypertension    Neuropathy    Primary localized osteoarthritis of left knee    Primary localized osteoarthritis of right knee 05/04/2020   Seasonal allergies     Past Surgical History:  Procedure Laterality Date   ANTERIOR CERVICAL DECOMP/DISCECTOMY FUSION  1999   Dr Charna Busman SURGERY     BUNIONECTOMY Right 2013    COLONOSCOPY N/A 11/06/2012   Procedure: COLONOSCOPY;  Surgeon: Dalia Heading, MD;  Location: [facility];  Service: Gastroenterology;  Laterality: N/A;   FOOT SURGERY Right 2013   bunionectomy   KNEE ARTHROSCOPY Bilateral 1990's   POSTERIOR LUMBAR FUSION  1985; 2009   Dr Jeral Fruit   TONSILLECTOMY  1950's   TOTAL HIP ARTHROPLASTY Right 12/07/2021   Procedure: TOTAL HIP ARTHROPLASTY ANTERIOR APPROACH;  Surgeon: Sheral Apley, MD;  Location: [facility];  Service: Orthopedics;  Laterality: Right;   TOTAL KNEE ARTHROPLASTY Left 07/14/2014   Procedure: LEFT TOTAL KNEE ARTHROPLASTY;  Surgeon: Nilda Simmer, MD;  Location: [facility];  Service: Orthopedics;  Laterality: Left;   TOTAL KNEE ARTHROPLASTY Right 05/11/2020   Procedure: TOTAL KNEE ARTHROPLASTY;  Surgeon: Salvatore Marvel, MD;  Location: [facility];  Service: Orthopedics;  Laterality: Right;   WISDOM TOOTH EXTRACTION      Current Medications: Current Meds  Medication Sig   acetaminophen (TYLENOL) 500 MG tablet Take 2 tablets (1,000 mg total) by mouth every 6 (six) hours as needed for mild pain or moderate pain.   alfuzosin (UROXATRAL) 10 MG 24 hr tablet Take 1 tablet (10 mg total) by mouth at bedtime.   aspirin EC 81 MG tablet Take 81 mg by mouth daily. Start after you have your surgery   augmented betamethasone dipropionate (DIPROLENE-AF) 0.05 % cream Apply topically.   calcium carbonate (TUMS - DOSED IN MG ELEMENTAL CALCIUM) 500 MG chewable tablet Chew 1-2 tablets by mouth daily as needed for indigestion or heartburn.   cholecalciferol (VITAMIN D3) 25 MCG (1000 UNIT) tablet Take 1,000 Units by mouth 3 (three) times a week.   cyclobenzaprine (FLEXERIL) 10 MG tablet Take 1 tablet (10 mg total) by mouth 3 (three) times daily as needed for muscle spasms.   docusate sodium (COLACE) 100 MG capsule Take 1 capsule (100 mg total) by mouth 2 (two) times daily.   furosemide (LASIX) 40 MG tablet Take 1 tablet (40 mg total) by mouth daily for 7 days.    gabapentin (NEURONTIN) 600 MG tablet Take 1,200 mg by mouth at bedtime.   losartan (COZAAR) 25 MG tablet Take 1 tablet (25 mg total) by mouth daily. (Patient taking differently: Take 25 mg by mouth every evening.)   pantoprazole (PROTONIX) 40 MG tablet Take 40 mg by mouth daily.   polyethylene glycol (MIRALAX / GLYCOLAX) 17 g packet Take 17 g by mouth daily.  sildenafil (VIAGRA) 100 MG tablet Take 100 mg by mouth daily as needed for erectile dysfunction.    VENTOLIN HFA 108 (90 Base) MCG/ACT inhaler Inhale 1-2 puffs into the lungs every 6 (six) hours as needed for wheezing or shortness of breath.   [DISCONTINUED] ondansetron (ZOFRAN) 8 MG tablet Take 8 mg by mouth every 8 (eight) hours as needed for nausea or vomiting.     Allergies:   Sulfa antibiotics, Morphine and codeine, and Oxycodone hcl   Social History   Socioeconomic History   Marital status: Married    Spouse name: Not on file   Number of children: Not on file   Years of education: Not on file   Highest education level: Not on file  Occupational History   Not on file  Tobacco Use   Smoking status: Former    Types: Cigars    Quit date: 2021    Years since quitting: 4.2   Smokeless tobacco: Never   Tobacco comments:    07/14/2014 smoked cigs on and off x 40 yrs- none since 2014, but does smoke occ cigar-  Vaping Use   Vaping status: Never Used  Substance and Sexual Activity   Alcohol use: Yes    Alcohol/week: 0.0 standard drinks of alcohol    Comment: occ   Drug use: No   Sexual activity: Yes  Other Topics Concern   Not on file  Social History Narrative   Not on file   Social Drivers of Health   Financial Resource Strain: Not on file  Food Insecurity: Not on file  Transportation Needs: Not on file  Physical Activity: Not on file  Stress: Not on file  Social Connections: Unknown (11/07/2022)   [facility]   Social Network    Social Network: Not on file     Family History: The  patient's family history includes Alzheimer's disease in his father and mother; CAD in his maternal grandfather and mother; CVA in his mother; Dementia in his father; Heart attack in his maternal grandfather; Stroke in his maternal grandmother and paternal grandmother. There is no history of Colon cancer or Colon polyps.  ROS:   Please see the history of present illness.     All other systems reviewed and are negative.  EKGs/Labs/Other Studies Reviewed:    The following studies were reviewed today:    Recent Labs: 09/09/2023: ALT 25; B Natriuretic Peptide 65.0; BUN 20; Creatinine, Ser 1.31; Hemoglobin 15.4; Platelets 172; Potassium 4.2; Sodium 138  Recent Lipid Panel    Component Value Date/Time   CHOL 145 08/16/2021 1155   TRIG 123 08/16/2021 1155   HDL 37 (L) 08/16/2021 1155   CHOLHDL 3.9 08/16/2021 1155   LDLCALC 86 08/16/2021 1155    Physical Exam:    Physical Exam: Blood pressure 114/72, pulse 64, height 5\' 10"  (1.778 m), weight 217 lb (98.4 kg), SpO2 97%.       GEN:  Well nourished, well developed in no acute distress HEENT: Normal NECK: No JVD; No carotid bruits LYMPHATICS: No lymphadenopathy CARDIAC: RRR , no murmurs, rubs, gallops RESPIRATORY:  Clear to auscultation without rales, wheezing or rhonchi  ABDOMEN: Soft, non-tender, non-distended MUSCULOSKELETAL:  1-2 + leg edema , L >> R   SKIN: Warm and dry NEUROLOGIC:  Alert and oriented x 3   EKG: .    NSR , BRRR      ASSESSMENT:    1. Localized swelling of left lower extremity   2.  Coronary artery disease involving native coronary artery of native heart without angina pectoris   3. DOE (dyspnea on exertion)     PLAN:     Chest pain : Coronary CT angiogram reveals no significant obstructive coronary artery disease.  He is not having any episodes of chest pain.  I do not think that his symptoms are related to obstructive coronary artery disease. .  2.  Hyperlipidemia: I advised continued weight  loss and improve diet.   3.  Shortness of breath with exertion:  He does not get much exercise.  He is severely limited in his mobility from his 5 different back surgeries.  He shuffles his feet.  I suspect a good bit of his issue is generalized deconditioning.  I encouraged him to elevate his legs higher than his heart periodically through the day and especially at night.  He should get compression hose.  He still eats a fairly high salt diet.  I have encouraged him to greatly reduce his salt intake.  Medication Adjustments/Labs and Tests Ordered: Current medicines are reviewed at length with the patient today.  Concerns regarding medicines are outlined above.  No orders of the defined types were placed in this encounter.  No orders of the defined types were placed in this encounter.   Patient Instructions  Follow-Up: At [facility], you and your health needs are our priority.  As part of our continuing mission to provide you with exceptional heart care, our providers are all part of one team.  This team includes your primary Cardiologist (physician) and Advanced Practice Providers or APPs (Physician Assistants and Nurse Practitioners) who all work together to provide you with the care you need, when you need it.  Your next appointment:   6 month(s)  Provider:   Tereso Newcomer, PA       1st Floor: - Lobby - Registration  - Pharmacy  - Lab - Cafe  2nd Floor: - PV Lab - Diagnostic Testing (echo, CT, nuclear med)  3rd Floor: - Vacant  4th Floor: - TCTS (cardiothoracic surgery) - AFib Clinic - Structural Heart Clinic - Vascular Surgery  - Vascular Ultrasound  5th Floor: - HeartCare Cardiology (general and EP) - Clinical Pharmacy for coumadin, hypertension, lipid, weight-loss medications, and med management appointments    Valet parking services will be available as well.    Get a Kardia Mobile to help you monitor your heart beat irregularities   Mayford Knife is a smart device that can record a medical-grade electrocardiogram (EKG) right on your smartphone. EKGs measure the electrical activity of your heart and are used in hospitals to detect irregularities with your heart rate or rhythm, which may be indicators of a heart condition such as atrial fibrillation (AFib). KardiaMobile records a single-lead EKG, which provides you and your doctor with reliable information on your heart health. KardiaMobile can detect AFib, Bradycardia, and Tachycardia, with more determinations available with a [employer]. Mayford Knife is clinically validated, CE marked, and FDA-cleared, making it one of the most reliable ways to check in on your heart from home. Kardia Mobile device by [employer]  is approximately $90 and the phone application is free.  The web site is:  https://www.alivecor.com   Kardia Mobile - sending an EKG Download app and set up profile. Run EKG - by placing 1-2 fingers on the silver plates After EKG is complete - Download PDF - Skip password (if you apply a password the provider will need it to view the EKG) Click  share button (square with upward arrow) in bottom left corner To send: choose MyChart (first time log into MyChart) Pop up window about sending ECG Click continue Choose type of message Choose provider Type subject and message Click send (EKG should be attached) - To send additional EKGs in one message click the paperclip image and bottom of page to attach.     For your  leg edema you  should do  the following 1. Leg elevation - I recommend the Lounge Dr. Leg rest.  See below for details  2. Salt restriction  -  Use potassium chloride instead of regular salt as a salt substitute. 3. Walk regularly 4. Compression hose - Medical Supply store  5. Weight loss    Available on [email or web address] Or  Go to [email or web address]         Signed, Kristeen Miss, MD  09/15/2023 1:53 PM    [facility]

## 2023-09-15 ENCOUNTER — Ambulatory Visit: Attending: Internal Medicine | Admitting: Cardiovascular Disease

## 2023-09-15 ENCOUNTER — Encounter: Payer: Self-pay | Admitting: Cardiovascular Disease

## 2023-09-15 VITALS — BP 114/72 | HR 64 | Ht 70.0 in | Wt 217.0 lb

## 2023-09-15 DIAGNOSIS — R2242 Localized swelling, mass and lump, left lower limb: Secondary | ICD-10-CM

## 2023-09-15 DIAGNOSIS — R0609 Other forms of dyspnea: Secondary | ICD-10-CM | POA: Diagnosis not present

## 2023-09-15 DIAGNOSIS — I2510 Atherosclerotic heart disease of native coronary artery without angina pectoris: Secondary | ICD-10-CM

## 2023-09-15 NOTE — Patient Instructions (Addendum)
 Follow-Up: At [facility], you and your health needs are our priority.  As part of our continuing mission to provide you with exceptional heart care, our providers are all part of one team.  This team includes your primary Cardiologist (physician) and Advanced Practice Providers or APPs (Physician Assistants and Nurse Practitioners) who all work together to provide you with the care you need, when you need it.  Your next appointment:   6 month(s)  Provider:   Tereso Newcomer, PA       1st Floor: - Lobby - Registration  - Pharmacy  - Lab - Cafe  2nd Floor: - PV Lab - Diagnostic Testing (echo, CT, nuclear med)  3rd Floor: - Vacant  4th Floor: - TCTS (cardiothoracic surgery) - AFib Clinic - Structural Heart Clinic - Vascular Surgery  - Vascular Ultrasound  5th Floor: - HeartCare Cardiology (general and EP) - Clinical Pharmacy for coumadin, hypertension, lipid, weight-loss medications, and med management appointments    Valet parking services will be available as well.    Get a Kardia Mobile to help you monitor your heart beat irregularities  Mayford Knife is a smart device that can record a medical-grade electrocardiogram (EKG) right on your smartphone. EKGs measure the electrical activity of your heart and are used in hospitals to detect irregularities with your heart rate or rhythm, which may be indicators of a heart condition such as atrial fibrillation (AFib). KardiaMobile records a single-lead EKG, which provides you and your doctor with reliable information on your heart health. KardiaMobile can detect AFib, Bradycardia, and Tachycardia, with more determinations available with a [employer]. Mayford Knife is clinically validated, CE marked, and FDA-cleared, making it one of the most reliable ways to check in on your heart from home. Kardia Mobile device by [employer]  is approximately $90 and the phone application is free.  The web site is:   https://www.alivecor.com   Kardia Mobile - sending an EKG Download app and set up profile. Run EKG - by placing 1-2 fingers on the silver plates After EKG is complete - Download PDF - Skip password (if you apply a password the provider will need it to view the EKG) Click share button (square with upward arrow) in bottom left corner To send: choose MyChart (first time log into MyChart) Pop up window about sending ECG Click continue Choose type of message Choose provider Type subject and message Click send (EKG should be attached) - To send additional EKGs in one message click the paperclip image and bottom of page to attach.     For your  leg edema you  should do  the following 1. Leg elevation - I recommend the Lounge Dr. Leg rest.  See below for details  2. Salt restriction  -  Use potassium chloride instead of regular salt as a salt substitute. 3. Walk regularly 4. Compression hose - Medical Supply store  5. Weight loss    Available on [email or web address] Or  Go to [email or web address]

## 2023-09-26 ENCOUNTER — Other Ambulatory Visit: Payer: Self-pay | Admitting: Cardiovascular Disease

## 2023-10-05 ENCOUNTER — Ambulatory Visit (HOSPITAL_COMMUNITY)

## 2023-11-09 ENCOUNTER — Ambulatory Visit (HOSPITAL_COMMUNITY)

## 2023-11-21 DIAGNOSIS — H524 Presbyopia: Secondary | ICD-10-CM | POA: Diagnosis not present

## 2023-11-21 DIAGNOSIS — H2513 Age-related nuclear cataract, bilateral: Secondary | ICD-10-CM | POA: Diagnosis not present

## 2023-11-21 DIAGNOSIS — H40013 Open angle with borderline findings, low risk, bilateral: Secondary | ICD-10-CM | POA: Diagnosis not present

## 2023-11-21 DIAGNOSIS — H52201 Unspecified astigmatism, right eye: Secondary | ICD-10-CM | POA: Diagnosis not present

## 2023-11-21 DIAGNOSIS — H5201 Hypermetropia, right eye: Secondary | ICD-10-CM | POA: Diagnosis not present

## 2023-11-22 DIAGNOSIS — K219 Gastro-esophageal reflux disease without esophagitis: Secondary | ICD-10-CM | POA: Diagnosis not present

## 2023-11-22 DIAGNOSIS — R140 Abdominal distension (gaseous): Secondary | ICD-10-CM | POA: Diagnosis not present

## 2023-11-22 DIAGNOSIS — K5900 Constipation, unspecified: Secondary | ICD-10-CM | POA: Diagnosis not present

## 2023-11-22 DIAGNOSIS — R159 Full incontinence of feces: Secondary | ICD-10-CM | POA: Diagnosis not present

## 2023-12-01 ENCOUNTER — Encounter: Payer: Self-pay | Admitting: Urology

## 2023-12-01 ENCOUNTER — Ambulatory Visit: Admitting: Urology

## 2023-12-01 VITALS — BP 118/79 | HR 61

## 2023-12-01 DIAGNOSIS — N138 Other obstructive and reflux uropathy: Secondary | ICD-10-CM | POA: Diagnosis not present

## 2023-12-01 DIAGNOSIS — R351 Nocturia: Secondary | ICD-10-CM | POA: Diagnosis not present

## 2023-12-01 DIAGNOSIS — N401 Enlarged prostate with lower urinary tract symptoms: Secondary | ICD-10-CM

## 2023-12-01 LAB — BLADDER SCAN AMB NON-IMAGING: Scan Result: 6

## 2023-12-01 MED ORDER — ALFUZOSIN HCL ER 10 MG PO TB24: 10.0000 mg | ORAL_TABLET | Freq: Every day | ORAL | 3 refills | Status: AC

## 2023-12-01 NOTE — Progress Notes (Signed)
 12/01/2023 1:00 PM   Patrick Mathews 1951-03-17 [id number]  Referring provider: Minus Amel, MD [address] [place],  [place] [address]  Followup BPH   HPI: Mr Runkle is a 73yo here for followup for BPH and nocturia. Last visit he was started on uroxatral  10mg  daily. IPSS 3 QOl 1 since starting uroxatral  10mg . Urine stream strong. No straining to urinate. Nocturia 0-1x. PVR 6cc. He takes sildenafil prn with mixed results.    PMH: Past Medical History:  Diagnosis Date   Adjacent segment disease with spinal stenosis    Aortic insufficiency 02/21/2023   TTE 05/25/2021: EF 55-60, no RWMA, GLS -17.9, normal RVSF, mild LAE, trivial MR, mild to moderate AI, AV sclerosis, mild dilation of aortic root (38 mm), mild dilation of ascending aorta (41 mm), RAP 3    Arthritis    knees; back (07/14/2014)   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   TTE 05/2021: 41 mm  CT 05/2021: 39 mm    Asthma    CAD (coronary artery disease)    Exertional dyspnea    GERD (gastroesophageal reflux disease)    Hyperlipidemia    Hypertension    Neuropathy    Primary localized osteoarthritis of left knee    Primary localized osteoarthritis of right knee 05/04/2020   Seasonal allergies     Surgical History: Past Surgical History:  Procedure Laterality Date   ANTERIOR CERVICAL DECOMP/DISCECTOMY FUSION  1999   Dr Dionicio Fray SURGERY     BUNIONECTOMY Right 2013   COLONOSCOPY N/A 11/06/2012   Procedure: COLONOSCOPY;  Surgeon: Beau Bound, MD;  [facility];  Service: Gastroenterology;  Laterality: N/A;   FOOT SURGERY Right 2013   bunionectomy   KNEE ARTHROSCOPY Bilateral 1990's   POSTERIOR LUMBAR FUSION  1985; 2009   Dr Rica Chalet   TONSILLECTOMY  1950's   TOTAL HIP ARTHROPLASTY Right 12/07/2021   Procedure: TOTAL HIP ARTHROPLASTY ANTERIOR APPROACH;  Surgeon: Saundra Curl, MD;  Location: [facility];  Service: Orthopedics;  Laterality: Right;   TOTAL KNEE ARTHROPLASTY Left  07/14/2014   Procedure: LEFT TOTAL KNEE ARTHROPLASTY;  Surgeon: Genevie Kerns, MD;  [facility];  Service: Orthopedics;  Laterality: Left;   TOTAL KNEE ARTHROPLASTY Right 05/11/2020   Procedure: TOTAL KNEE ARTHROPLASTY;  Surgeon: Elly Habermann, MD;  Location: [facility];  Service: Orthopedics;  Laterality: Right;   WISDOM TOOTH EXTRACTION      Home Medications:  Allergies as of 12/01/2023       Reactions   Sulfa Antibiotics Hives, Rash   Morphine And Codeine Nausea And Vomiting   Oxycodone  Hcl Other (See Comments)   Pt feels loopy        Medication List        Accurate as of December 01, 2023  1:00 PM. If you have any questions, ask your nurse or doctor.          acetaminophen  500 MG tablet Commonly known as: TYLENOL  Take 2 tablets (1,000 mg total) by mouth every 6 (six) hours as needed for mild pain or moderate pain.   alfuzosin  10 MG 24 hr tablet Commonly known as: UROXATRAL  Take 1 tablet (10 mg total) by mouth at bedtime.   aspirin  EC 81 MG tablet Take 81 mg by mouth daily. Start after you have your surgery   augmented betamethasone dipropionate 0.05 % cream Commonly known as: DIPROLENE-AF Apply topically.   calcium  carbonate 500 MG chewable tablet Commonly known as: TUMS - dosed  in mg elemental calcium  Chew 1-2 tablets by mouth daily as needed for indigestion or heartburn.   cholecalciferol 25 MCG (1000 UNIT) tablet Commonly known as: VITAMIN D3 Take 1,000 Units by mouth 3 (three) times a week.   cyclobenzaprine  10 MG tablet Commonly known as: FLEXERIL  Take 1 tablet (10 mg total) by mouth 3 (three) times daily as needed for muscle spasms.   docusate sodium  100 MG capsule Commonly known as: COLACE Take 1 capsule (100 mg total) by mouth 2 (two) times daily.   furosemide  40 MG tablet Commonly known as: LASIX  Take 1 tablet (40 mg total) by mouth daily for 7 days.   gabapentin  600 MG tablet Commonly known as: NEURONTIN  Take 1,200 mg by mouth at  bedtime.   HYDROmorphone  2 MG tablet Commonly known as: DILAUDID  Take 1-2 tablets (2-4 mg total) by mouth every 4 (four) hours as needed for moderate pain (pain score 4-6).   losartan  25 MG tablet Commonly known as: COZAAR  TAKE ONE TABLET (25MG  TOTAL) BY MOUTH DAILY   ondansetron  4 MG disintegrating tablet Commonly known as: ZOFRAN -ODT Take 4 mg by mouth every 8 (eight) hours as needed.   pantoprazole  40 MG tablet Commonly known as: PROTONIX  Take 40 mg by mouth daily.   polyethylene glycol 17 g packet Commonly known as: MIRALAX  / GLYCOLAX  Take 17 g by mouth daily.   sildenafil 100 MG tablet Commonly known as: VIAGRA Take 100 mg by mouth daily as needed for erectile dysfunction.   Ventolin  HFA 108 (90 Base) MCG/ACT inhaler Generic drug: albuterol  Inhale 1-2 puffs into the lungs every 6 (six) hours as needed for wheezing or shortness of breath.        Allergies:  Allergies  Allergen Reactions   Sulfa Antibiotics Hives and Rash   Morphine And Codeine Nausea And Vomiting   Oxycodone  Hcl Other (See Comments)    Pt feels loopy    Family History: Family History  Problem Relation Age of Onset   CAD Mother    CVA Mother    Alzheimer's disease Mother    Dementia Father    Alzheimer's disease Father    Stroke Maternal Grandmother    Heart attack Maternal Grandfather    CAD Maternal Grandfather    Stroke Paternal Grandmother    Colon cancer Neg Hx    Colon polyps Neg Hx     Social History:  reports that he quit smoking about 4 years ago. His smoking use included cigars. He has never used smokeless tobacco. He reports current alcohol use. He reports that he does not use drugs.  ROS: All other review of systems were reviewed and are negative except what is noted above in HPI  Physical Exam: BP 118/79   Pulse 61   Constitutional:  Alert and oriented, No acute distress. HEENT: [place] AT, moist mucus membranes.  Trachea midline, no masses. Cardiovascular: No clubbing,  cyanosis, or edema. Respiratory: Normal respiratory effort, no increased work of breathing. GI: Abdomen is soft, nontender, nondistended, no abdominal masses GU: No CVA tenderness.  Lymph: No cervical or inguinal lymphadenopathy. Skin: No rashes, bruises or suspicious lesions. Neurologic: Grossly intact, no focal deficits, moving all 4 extremities. Psychiatric: Normal mood and affect.  Laboratory Data: Lab Results  Component Value Date   WBC 4.7 09/09/2023   HGB 15.4 09/09/2023   HCT 45.9 09/09/2023   MCV 90.5 09/09/2023   PLT 172 09/09/2023    Lab Results  Component Value Date   CREATININE 1.31 (H) 09/09/2023  No results found for: PSA  No results found for: TESTOSTERONE   No results found for: HGBA1C  Urinalysis    Component Value Date/Time   COLORURINE STRAW (A) 09/09/2023 1220   APPEARANCEUR CLEAR 09/09/2023 1220   APPEARANCEUR Clear 08/30/2023 1411   LABSPEC 1.010 09/09/2023 1220   PHURINE 6.0 09/09/2023 1220   GLUCOSEU NEGATIVE 09/09/2023 1220   HGBUR NEGATIVE 09/09/2023 1220   BILIRUBINUR NEGATIVE 09/09/2023 1220   BILIRUBINUR Negative 08/30/2023 1411   KETONESUR NEGATIVE 09/09/2023 1220   PROTEINUR NEGATIVE 09/09/2023 1220   UROBILINOGEN 0.2 07/03/2014 1011   NITRITE NEGATIVE 09/09/2023 1220   LEUKOCYTESUR NEGATIVE 09/09/2023 1220    Lab Results  Component Value Date   LABMICR Comment 08/30/2023    Pertinent Imaging:  Results for orders placed during the hospital encounter of 05/25/20  DG Abdomen 1 View  Narrative CLINICAL DATA:  Constipation, nausea  EXAM: ABDOMEN - 1 VIEW  COMPARISON:  None.  FINDINGS: Nonobstructive pattern of bowel gas with gas present to the rectum. No significant burden of stool in the colon. No free air in the abdomen on supine radiographs. No radio-opaque calculi or other significant radiographic abnormality are seen.  IMPRESSION: Nonobstructive pattern of bowel gas with gas present to the rectum. No  significant burden of stool in the colon. No free air in the abdomen on supine radiographs.   Electronically Signed By: Hubbard Mad M.D. On: 05/25/2020 17:54  No results found for this or any previous visit.  No results found for this or any previous visit.  No results found for this or any previous visit.  No results found for this or any previous visit.  No results found for this or any previous visit.  No results found for this or any previous visit.  Results for orders placed during the hospital encounter of 07/17/20  CT Renal Stone Study  Narrative CLINICAL DATA:  Flank pain with kidney stone suspected. Mid abdominal pain for several months  EXAM: CT ABDOMEN AND PELVIS WITHOUT CONTRAST  TECHNIQUE: Multidetector CT imaging of the abdomen and pelvis was performed following the standard protocol without IV contrast.  COMPARISON:  06/10/2020  FINDINGS: Lower chest:  No contributory findings.  Hepatobiliary: 4.1 cm right hepatic cyst.No evidence of biliary obstruction or stone.  Pancreas: Unremarkable.  Spleen: Unremarkable.  Adrenals/Urinary Tract: Negative adrenals. No hydronephrosis or ureteral stone. Renal sinus cysts as confirmed on prior enhanced CT. Four right renal calculi measuring up to 3 mm. Two punctate left upper pole renal calculi. 37 mm left lower pole cyst. Unremarkable bladder.  Stomach/Bowel: No obstruction. No visible bowel inflammation. Left colonic diverticulosis.  Vascular/Lymphatic: No acute vascular abnormality. Scattered atheromatous calcifications. No mass or adenopathy.  Reproductive:Partially covered presumed left hydrocele.  Other: No ascites or pneumoperitoneum.  Musculoskeletal: No acute abnormalities. L2-L5 PLIF with solid arthrodesis. L5-S1 facet osteoarthritis with anterolisthesis and ankylosis. Advanced disc degeneration above the fusion.  IMPRESSION: 1. No acute finding. 2. Bilateral nonobstructing renal  calculi.   Electronically Signed By: Aleta Hutch M.D. On: 07/17/2020 09:10   Assessment & Plan:    1. Benign prostatic hyperplasia with urinary obstruction (Primary) Continue uroxatral  10mg  qhs - Urinalysis, Routine w reflex microscopic - BLADDER SCAN AMB NON-IMAGING  2. Nocturia Continue uroxatral  10mg  at bedtime  3. Erectile dysfunction -continue sildenafil 100mg  prn   No follow-ups on file.  Johnie Nailer, MD  [facility] [place]

## 2023-12-01 NOTE — Patient Instructions (Signed)

## 2023-12-01 NOTE — Progress Notes (Signed)
post void residual=6 

## 2023-12-04 DIAGNOSIS — R7309 Other abnormal glucose: Secondary | ICD-10-CM | POA: Diagnosis not present

## 2023-12-04 DIAGNOSIS — Z6832 Body mass index (BMI) 32.0-32.9, adult: Secondary | ICD-10-CM | POA: Diagnosis not present

## 2023-12-04 DIAGNOSIS — M25551 Pain in right hip: Secondary | ICD-10-CM | POA: Diagnosis not present

## 2023-12-04 DIAGNOSIS — E538 Deficiency of other specified B group vitamins: Secondary | ICD-10-CM | POA: Diagnosis not present

## 2023-12-04 DIAGNOSIS — M25562 Pain in left knee: Secondary | ICD-10-CM | POA: Diagnosis not present

## 2023-12-04 DIAGNOSIS — R5383 Other fatigue: Secondary | ICD-10-CM | POA: Diagnosis not present

## 2023-12-04 DIAGNOSIS — E6609 Other obesity due to excess calories: Secondary | ICD-10-CM | POA: Diagnosis not present

## 2023-12-06 DIAGNOSIS — S41151A Open bite of right upper arm, initial encounter: Secondary | ICD-10-CM | POA: Diagnosis not present

## 2023-12-07 ENCOUNTER — Other Ambulatory Visit: Payer: Self-pay

## 2023-12-07 ENCOUNTER — Ambulatory Visit (HOSPITAL_COMMUNITY): Attending: Student

## 2023-12-07 ENCOUNTER — Encounter (HOSPITAL_COMMUNITY): Payer: Self-pay

## 2023-12-07 DIAGNOSIS — M4316 Spondylolisthesis, lumbar region: Secondary | ICD-10-CM | POA: Diagnosis not present

## 2023-12-07 DIAGNOSIS — R262 Difficulty in walking, not elsewhere classified: Secondary | ICD-10-CM | POA: Diagnosis not present

## 2023-12-07 NOTE — Therapy (Signed)
 OUTPATIENT PHYSICAL THERAPY THORACOLUMBAR EVALUATION   Patient Name: CASTIEL LAURICELLA MRN: [id number] DOB:12/06/1950, 73 y.o., male Today's Date: 12/07/2023  END OF SESSION:  PT End of Session - 12/07/23 1155     Visit Number 1    Authorization Type [employer]    Authorization Time Period one time visit    PT Start Time 1150    PT Stop Time 1220    PT Time Calculation (min) 30 min    Activity Tolerance Patient tolerated treatment well    Behavior During Therapy WFL for tasks assessed/performed          Past Medical History:  Diagnosis Date   Adjacent segment disease with spinal stenosis    Aortic insufficiency 02/21/2023   TTE 05/25/2021: EF 55-60, no RWMA, GLS -17.9, normal RVSF, mild LAE, trivial MR, mild to moderate AI, AV sclerosis, mild dilation of aortic root (38 mm), mild dilation of ascending aorta (41 mm), RAP 3    Arthritis    knees; back (07/14/2014)   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   TTE 05/2021: 41 mm  CT 05/2021: 39 mm    Asthma    CAD (coronary artery disease)    Exertional dyspnea    GERD (gastroesophageal reflux disease)    Hyperlipidemia    Hypertension    Neuropathy    Primary localized osteoarthritis of left knee    Primary localized osteoarthritis of right knee 05/04/2020   Seasonal allergies    Past Surgical History:  Procedure Laterality Date   ANTERIOR CERVICAL DECOMP/DISCECTOMY FUSION  1999   Dr Leeann SANES SURGERY     BUNIONECTOMY Right 2013   COLONOSCOPY N/A 11/06/2012   Procedure: COLONOSCOPY;  Surgeon: Oneil DELENA Budge, MD;  [facility]: [facility];  Service: Gastroenterology;  Laterality: N/A;   FOOT SURGERY Right 2013   bunionectomy   KNEE ARTHROSCOPY Bilateral 1990's   POSTERIOR LUMBAR FUSION  1985; 2009   Dr Leeann   TONSILLECTOMY  1950's   TOTAL HIP ARTHROPLASTY Right 12/07/2021   Procedure: TOTAL HIP ARTHROPLASTY ANTERIOR APPROACH;  Surgeon: Beverley Evalene BIRCH, MD;  Location: [facility];  Service:  Orthopedics;  Laterality: Right;   TOTAL KNEE ARTHROPLASTY Left 07/14/2014   Procedure: LEFT TOTAL KNEE ARTHROPLASTY;  Surgeon: Lamar DELENA Millman, MD;  Location[facility];  Service: Orthopedics;  Laterality: Left;   TOTAL KNEE ARTHROPLASTY Right 05/11/2020   Procedure: TOTAL KNEE ARTHROPLASTY;  Surgeon: Millman Lamar, MD;  Location: [facility];  Service: Orthopedics;  Laterality: Right;   WISDOM TOOTH EXTRACTION     Patient Active Problem List   Diagnosis Date Noted   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   Preoperative cardiovascular examination 02/21/2023   Aortic insufficiency 02/21/2023   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   Essential hypertension 02/21/2023   History of lumbar fusion 08/09/2022   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   CAD (coronary artery disease) 08/16/2021   DOE (dyspnea on exertion) 08/16/2021   Tremor 04/27/2021   Gait abnormality 04/27/2021   Primary localized osteoarthritis of right knee 05/04/2020   Lumbar adjacent segment disease with spondylolisthesis 02/06/2020   Chest pressure 09/27/2019   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   Neck pain 06/23/2015   Morbid obesity (HCC) 05/08/2015   DJD (degenerative joint disease) of knee 07/14/2014   Acute upper respiratory infection 06/12/2014   Cough 06/12/2014   Primary localized osteoarthritis of left knee    Hyperlipidemia    GERD (gastroesophageal  reflux disease)    Back pain    Dyspnea 05/07/2014   Multiple pulmonary nodules 05/07/2014   Thoracic radiculitis 08/19/2013    PCP: Marvine Rush, MD  REFERRING PROVIDER: Jennetta Gerard BIRCH, NP  REFERRING DIAG: spoldylolisthesis, lumbar region   Rationale for Evaluation and Treatment: Rehabilitation  THERAPY DIAG:  Spondylolisthesis of lumbar region  Difficulty in walking, not elsewhere classified  ONSET DATE: Multiple years  SUBJECTIVE:                                                                                                                                                                                            SUBJECTIVE STATEMENT: Pt here prior this year, wanting to work on more gait difficulties. Reports some numbness/tingling in LLE. He says his L foot continues to drop.   PERTINENT HISTORY:  THA  PAIN:  Are you having pain? No  PRECAUTIONS: None  RED FLAGS: None   WEIGHT BEARING RESTRICTIONS: No  FALLS:  Has patient fallen in last 6 months? No   PATIENT GOALS: get my left leg back equal to R leg.  NEXT MD VISIT:   OBJECTIVE:  Note: Objective measures were completed at Evaluation unless otherwise noted.  DIAGNOSTIC FINDINGS:    PATIENT SURVEYS:  Modified Oswestry: 2/50 = 4%  COGNITION: Overall cognitive status: Within functional limits for tasks assessed     SENSATION: Light touch: Impaired  on LLE  POSTURE: decreased lumbar lordosis  PALPATION: Nontender to touch in paraspinals  LUMBAR ROM:   AROM eval  Flexion 100% no pain  Extension 100% and tightness  Right lateral flexion 100%  Left lateral flexion 100%  Right rotation   Left rotation    (Blank rows = not tested)  LOWER EXTREMITY ROM:     Active  Right eval Left eval  Hip flexion    Hip extension 0 0  Hip abduction    Hip adduction    Hip internal rotation    Hip external rotation    Knee flexion    Knee extension    Ankle dorsiflexion    Ankle plantarflexion    Ankle inversion    Ankle eversion     (Blank rows = not tested)  LOWER EXTREMITY MMT:    MMT Right eval Left eval  Hip flexion 4 4-  Hip extension 3- 3-  Hip abduction 4- 4-  Hip adduction    Hip internal rotation    Hip external rotation    Knee flexion    Knee extension 5 4+  Ankle dorsiflexion 5 4-  Ankle plantarflexion    Ankle inversion    Ankle  eversion     (Blank rows = not tested)    FUNCTIONAL TESTS:  30 Second Chair Stand Test: 13x  Norms:   Age 56-64 35-69 39-74  75-79 83-84 80-89 2-94  Women 15 15 14 13 12 11 9   Men 17 16 15 14 13 11 9    L SLS: 4.43 seconds  R SLS: 4.21 seconds  Norms: 18-39  F: 43.5 seconds  M: 43.2 seconds 40-49  F: 40.4 seconds  M: 40.1 seconds 50-59  F: 36 seconds  M: 38.1 seconds 60-69  F: 25.1 seconds  M: 28.7 seconds 70-79  F: 11.3 seconds  M: 18.3 seconds  GAIT: Distance walked: 50 Assistive device utilized: None Level of assistance: Complete Independence Comments: increased left latera weight shift with antalgia and forward trunk flexion  TREATMENT DATE:  12/07/2023 PT Evaluation                                                                                                                                   PATIENT EDUCATION:  Education details: PT Evaluation, findings, prognosis, frequency, attendance policy, and HEP. Person educated: Patient Education method: Medical illustrator Education comprehension: verbalized understanding  HOME EXERCISE PROGRAM: Access Code: [email or web address] URL: [email or web address] Date: 12/07/2023 Prepared by: Omega Bottcher  Exercises - Single Leg Stance with Support  - 1 x daily - 7 x weekly - 3 sets - 3 reps - 30-60 hold - Standing Hip Abduction with Counter Support  - 1 x daily - 7 x weekly - 3 sets - 10 reps - Tandem Stance  - 1 x daily - 7 x weekly - 3 sets - 3 reps - 30-60 hold - Mini Squat with Counter Support  - 1 x daily - 7 x weekly - 3 sets - 10 reps - Side Stepping with Resistance at Thighs  - 1 x daily - 7 x weekly - 3 sets - 10 reps - Heel Toe Raises with Counter Support  - 1 x daily - 7 x weekly - 3 sets - 10 reps  ASSESSMENT:  CLINICAL IMPRESSION: Patient is a 73 y.o. male who was seen today for physical therapy evaluation and treatment for spoldylolisthesis, lumbar region. Pt known to this clinic. Presenting with similar problem compared to last PT POC. No significant changes identified to keep on caseload at this time. [place]  [place]. Pt was advised to use HEP and attempt advanced homework and to get a new referral if feel he needs more. Pt in agreement.  OBJECTIVE IMPAIRMENTS: decreased balance.    CLINICAL DECISION MAKING: Stable/uncomplicated  EVALUATION COMPLEXITY: Low   GOALS: Goals reviewed with patient? Yes  SHORT TERM GOALS: Target date: 12/07/2023  Pt will be independent with HEP in order to demonstrate participation in Physical Therapy POC.  Baseline: Goal status: INITIAL  PLAN:  PT FREQUENCY: 2x/week  PT DURATION: 4 weeks  PLANNED  INTERVENTIONS: 97164- PT Re-evaluation, 97750- Physical Performance Testing, 97110-Therapeutic exercises, 97530- Therapeutic activity, [id number]- Neuromuscular re-education, 97535- Self Care, [id number]- Manual therapy, [id number]- Gait training, [id number]- Electrical stimulation (unattended), [id number]- Electrical stimulation (manual), Cryotherapy, and Moist heat.  PLAN FOR NEXT SESSION: one time visit  Omega JONETTA Donna ALMETA, DPT [facility] [place] [phone] office   Omega JONETTA Donna, PT 12/07/2023, 12:24 PM

## 2024-01-15 DIAGNOSIS — R52 Pain, unspecified: Secondary | ICD-10-CM | POA: Diagnosis not present

## 2024-01-15 DIAGNOSIS — J029 Acute pharyngitis, unspecified: Secondary | ICD-10-CM | POA: Diagnosis not present

## 2024-01-15 DIAGNOSIS — R5381 Other malaise: Secondary | ICD-10-CM | POA: Diagnosis not present

## 2024-01-15 DIAGNOSIS — R5383 Other fatigue: Secondary | ICD-10-CM | POA: Diagnosis not present

## 2024-01-15 DIAGNOSIS — R509 Fever, unspecified: Secondary | ICD-10-CM | POA: Diagnosis not present

## 2024-01-15 DIAGNOSIS — Z20822 Contact with and (suspected) exposure to covid-19: Secondary | ICD-10-CM | POA: Diagnosis not present

## 2024-01-15 DIAGNOSIS — J020 Streptococcal pharyngitis: Secondary | ICD-10-CM | POA: Diagnosis not present

## 2024-01-15 DIAGNOSIS — J111 Influenza due to unidentified influenza virus with other respiratory manifestations: Secondary | ICD-10-CM | POA: Diagnosis not present

## 2024-01-15 DIAGNOSIS — R0981 Nasal congestion: Secondary | ICD-10-CM | POA: Diagnosis not present

## 2024-01-15 DIAGNOSIS — R059 Cough, unspecified: Secondary | ICD-10-CM | POA: Diagnosis not present

## 2024-01-22 ENCOUNTER — Emergency Department (HOSPITAL_COMMUNITY)

## 2024-01-22 ENCOUNTER — Other Ambulatory Visit: Payer: Self-pay

## 2024-01-22 ENCOUNTER — Emergency Department (HOSPITAL_COMMUNITY)
Admission: EM | Admit: 2024-01-22 | Discharge: 2024-01-22 | Disposition: A | Discharge status: Home / Self Care | Source: Ambulatory Visit | Attending: Emergency Medicine | Admitting: Emergency Medicine

## 2024-01-22 DIAGNOSIS — R339 Retention of urine, unspecified: Secondary | ICD-10-CM | POA: Diagnosis present

## 2024-01-22 DIAGNOSIS — R300 Dysuria: Secondary | ICD-10-CM | POA: Insufficient documentation

## 2024-01-22 DIAGNOSIS — K5730 Diverticulosis of large intestine without perforation or abscess without bleeding: Secondary | ICD-10-CM | POA: Insufficient documentation

## 2024-01-22 DIAGNOSIS — I700 Atherosclerosis of aorta: Secondary | ICD-10-CM | POA: Diagnosis not present

## 2024-01-22 DIAGNOSIS — N200 Calculus of kidney: Secondary | ICD-10-CM | POA: Diagnosis not present

## 2024-01-22 DIAGNOSIS — K7689 Other specified diseases of liver: Secondary | ICD-10-CM | POA: Diagnosis not present

## 2024-01-22 DIAGNOSIS — N410 Acute prostatitis: Secondary | ICD-10-CM | POA: Diagnosis not present

## 2024-01-22 LAB — URINALYSIS, ROUTINE W REFLEX MICROSCOPIC
Bilirubin Urine: NEGATIVE
Glucose, UA: NEGATIVE mg/dL
Hgb urine dipstick: NEGATIVE
Ketones, ur: NEGATIVE mg/dL
Leukocytes,Ua: NEGATIVE
Nitrite: NEGATIVE
Protein, ur: NEGATIVE mg/dL
Specific Gravity, Urine: 1.012 (ref 1.005–1.030)
pH: 7 (ref 5.0–8.0)

## 2024-01-22 LAB — COMPREHENSIVE METABOLIC PANEL WITH GFR
ALT: 37 U/L (ref 0–44)
AST: 25 U/L (ref 15–41)
Albumin: 3.6 g/dL (ref 3.5–5.0)
Alkaline Phosphatase: 103 U/L (ref 38–126)
Anion gap: 11 (ref 5–15)
BUN: 13 mg/dL (ref 8–23)
CO2: 23 mmol/L (ref 22–32)
Calcium: 9 mg/dL (ref 8.9–10.3)
Chloride: 102 mmol/L (ref 98–111)
Creatinine, Ser: 1.07 mg/dL (ref 0.61–1.24)
GFR, Estimated: >60 mL/min (ref >60)
Glucose, Bld: 101 mg/dL — ABNORMAL HIGH (ref 70–99)
Potassium: 3.9 mmol/L (ref 3.5–5.1)
Sodium: 136 mmol/L (ref 135–145)
Total Bilirubin: 1 mg/dL (ref 0.0–1.2)
Total Protein: 6.9 g/dL (ref 6.5–8.1)

## 2024-01-22 LAB — CBC WITH DIFFERENTIAL/PLATELET
Abs Immature Granulocytes: 0.04 K/uL (ref 0.00–0.07)
Basophils Absolute: 0 K/uL (ref 0.0–0.1)
Basophils Relative: 0 %
Eosinophils Absolute: 0 K/uL (ref 0.0–0.5)
Eosinophils Relative: 0 %
HCT: 41.1 % (ref 39.0–52.0)
Hemoglobin: 14.4 g/dL (ref 13.0–17.0)
Immature Granulocytes: 1 %
Lymphocytes Relative: 13 %
Lymphs Abs: 1.1 K/uL (ref 0.7–4.0)
MCH: 31.5 pg (ref 26.0–34.0)
MCHC: 35 g/dL (ref 30.0–36.0)
MCV: 89.9 fL (ref 80.0–100.0)
Monocytes Absolute: 0.6 K/uL (ref 0.1–1.0)
Monocytes Relative: 7 %
Neutro Abs: 6.6 K/uL (ref 1.7–7.7)
Neutrophils Relative %: 79 %
Platelets: 210 K/uL (ref 150–400)
RBC: 4.57 MIL/uL (ref 4.22–5.81)
RDW: 12.5 % (ref 11.5–15.5)
WBC: 8.4 K/uL (ref 4.0–10.5)
nRBC: 0 % (ref 0.0–0.2)

## 2024-01-22 MED ORDER — SODIUM CHLORIDE 0.9 % IV BOLUS: 500.0000 mL | Freq: Once | INTRAVENOUS | Status: DC

## 2024-01-22 MED ORDER — CIPROFLOXACIN HCL 500 MG PO TABS: 500.0000 mg | ORAL_TABLET | Freq: Two times a day (BID) | ORAL | 0 refills | Status: AC

## 2024-01-22 MED ORDER — PHENAZOPYRIDINE HCL 200 MG PO TABS: 200.0000 mg | ORAL_TABLET | Freq: Three times a day (TID) | ORAL | 0 refills | Status: AC | PRN

## 2024-01-22 NOTE — Discharge Instructions (Addendum)
 Stop amoxicillian.  Take antibiotic as directed.  The phenazopyridine  will cause your urine to turn orange

## 2024-01-22 NOTE — ED Triage Notes (Signed)
 Pt c/o barely being able to urinate since last night.

## 2024-01-22 NOTE — ED Provider Notes (Signed)
 [place] EMERGENCY DEPARTMENT AT [facility] Provider Note   CSN: [id number] Arrival date & time: 01/22/24  9073     Patient presents with: Urinary Retention   Patrick Mathews is a 73 y.o. male.   Patient complains of difficulty urinating.  Patient states that he has to get up multiple times during the night to urinate.  Patient states when he urinates he only produces a small amount of urine and almost immediately feels like he has to urinate again. Patient has been seen by urology in the past.  Patient reports that he has had a history of an enlarged prostate.  Patient is currently taking amoxicillin .  He was treated for strep throat.  Patient denies any nausea or vomiting he has not had any fever or chills he denies any current flank pain.  The history is provided by the patient and the spouse. No language interpreter was used.       Prior to Admission medications   Medication Sig Start Date End Date Taking? Authorizing Provider  acetaminophen  (TYLENOL ) 500 MG tablet Take 2 tablets (1,000 mg total) by mouth every 6 (six) hours as needed for mild pain or moderate pain. 12/08/21  Yes Gawne, Meghan M, PA-C  alfuzosin  (UROXATRAL ) 10 MG 24 hr tablet Take 1 tablet (10 mg total) by mouth at bedtime. 12/01/23  Yes McKenzie, Belvie CROME, MD  amoxicillin  (AMOXIL ) 500 MG tablet Take 500 mg by mouth 2 (two) times daily. 01/15/24  Yes [provider]  augmented betamethasone dipropionate (DIPROLENE-AF) 0.05 % cream Apply topically. 09/08/23  Yes [provider]  calcium  carbonate (TUMS - DOSED IN MG ELEMENTAL CALCIUM ) 500 MG chewable tablet Chew 1-2 tablets by mouth daily as needed for indigestion or heartburn.   Yes [provider]  cholecalciferol (VITAMIN D3) 25 MCG (1000 UNIT) tablet Take 1,000 Units by mouth 3 (three) times a week.   Yes [provider]  ciprofloxacin  (CIPRO ) 500 MG tablet Take 1 tablet (500 mg total) by mouth 2 (two) times daily  for 7 days. 01/22/24 01/29/24 Yes Flint Sonny POUR, PA-C  Cyanocobalamin  (VITAMIN B12) 1000 MCG TBCR Take 1,000 mcg by mouth in the morning.   Yes [provider]  cyclobenzaprine  (FLEXERIL ) 10 MG tablet Take 1 tablet (10 mg total) by mouth 3 (three) times daily as needed for muscle spasms. 04/27/23  Yes Mavis Purchase, MD  docusate sodium  (COLACE) 100 MG capsule Take 1 capsule (100 mg total) by mouth 2 (two) times daily. Patient taking differently: Take 100-200 mg by mouth daily. 04/27/23  Yes Mavis Purchase, MD  gabapentin  (NEURONTIN ) 600 MG tablet Take 600 mg by mouth 2 (two) times daily. Sometimes take 2 tablets every evening   Yes [provider]  ondansetron  (ZOFRAN -ODT) 4 MG disintegrating tablet Take 4 mg by mouth every 8 (eight) hours as needed.   Yes [provider]  pantoprazole  (PROTONIX ) 40 MG tablet Take 40 mg by mouth 2 (two) times daily.   Yes [provider]  phenazopyridine  (PYRIDIUM ) 200 MG tablet Take 1 tablet (200 mg total) by mouth 3 (three) times daily as needed for pain. 01/22/24 01/21/25 Yes Finnley Larusso K, PA-C  polyethylene glycol (MIRALAX  / GLYCOLAX ) 17 g packet Take 17 g by mouth daily.   Yes [provider]  sildenafil (VIAGRA) 100 MG tablet Take 100 mg by mouth daily as needed for erectile dysfunction.  04/21/20  Yes [provider]  VENTOLIN  HFA 108 (90 Base) MCG/ACT inhaler Inhale  1-2 puffs into the lungs every 6 (six) hours as needed for wheezing or shortness of breath. 03/07/22  Yes [provider]  Zinc  50 MG TABS Take 50 mg by mouth daily.   Yes [provider]    Allergies: Sulfa antibiotics, Morphine and codeine, and Oxycodone  hcl    Review of Systems  All other systems reviewed and are negative.   Updated Vital Signs BP (!) 142/87   Pulse 71   Temp 97.8 F (36.6 C) (Oral)   Resp 17   Ht 5' 10 (1.778 m)   Wt 97.5 kg   SpO2 98%   BMI 30.85 kg/m   Physical Exam Vitals and  nursing note reviewed.  Constitutional:      Appearance: He is well-developed.  HENT:     Head: Normocephalic.     Mouth/Throat:     Mouth: Mucous membranes are moist.  Eyes:     Pupils: Pupils are equal, round, and reactive to light.  Cardiovascular:     Rate and Rhythm: Normal rate.  Pulmonary:     Effort: Pulmonary effort is normal.  Abdominal:     General: Abdomen is flat. There is no distension.     Palpations: Abdomen is soft.  Musculoskeletal:        General: Normal range of motion.  Skin:    General: Skin is warm.  Neurological:     General: No focal deficit present.     Mental Status: He is alert and oriented to person, place, and time.  Psychiatric:        Mood and Affect: Mood normal.     (all labs ordered are listed, but only abnormal results are displayed) Labs Reviewed  COMPREHENSIVE METABOLIC PANEL WITH GFR - Abnormal; Notable for the following components:      Result Value   Glucose, Bld 101 (*)    All other components within normal limits  URINALYSIS, ROUTINE W REFLEX MICROSCOPIC  CBC WITH DIFFERENTIAL/PLATELET    EKG: None  Radiology: CT Renal Stone Study Result Date: 01/22/2024 CLINICAL DATA:  Abdominal/flank pain, stone suspected Limited urination since last night EXAM: CT ABDOMEN AND PELVIS WITHOUT CONTRAST TECHNIQUE: Multidetector CT imaging of the abdomen and pelvis was performed following the standard protocol without IV contrast. RADIATION DOSE REDUCTION: This exam was performed according to the departmental dose-optimization program which includes automated exposure control, adjustment of the mA and/or kV according to patient size and/or use of iterative reconstruction technique. COMPARISON:  Abdominopelvic CT 09/09/2023 and 07/20/2023. Chest CTA 09/09/2023. FINDINGS: Lower chest: Scattered mild atherosclerosis of the aorta and coronary arteries. Stable scattered small subpleural nodules at both lung bases, consistent with benign findings based on  stability. No specific follow-up imaging recommended. No confluent airspace disease. Hepatobiliary: The liver appears stable, without suspicious findings on noncontrast imaging. There is a stable cyst anteriorly in the right hepatic lobe. No evidence of gallstones, gallbladder wall thickening or biliary dilatation. Pancreas: Unremarkable. No pancreatic ductal dilatation or surrounding inflammatory changes. Spleen: Normal in size without focal abnormality. Adrenals/Urinary Tract: Both adrenal glands appear normal. Stable small nonobstructing bilateral renal calculi. No evidence of ureteral calculus, hydronephrosis or perinephric soft tissue stranding. As correlated with previous contrast enhanced study, there are stable right renal parapelvic cysts for which no specific follow-up imaging is recommended. The bladder appears unremarkable for its degree of distention. Stomach/Bowel: No enteric contrast administered. The stomach appears unremarkable for its degree of distension. No evidence of bowel wall thickening, distention or surrounding inflammatory  change. The appendix appears normal. Mild diverticular changes of the distal colon without evidence of acute inflammation. Mildly prominent stool throughout the colon. Vascular/Lymphatic: There are no enlarged abdominal or pelvic lymph nodes. Aortic and branch vessel atherosclerosis. No evidence of aneurysm. Reproductive: Stable mild enlargement of the prostate gland. Other: No evidence of abdominal wall mass or hernia. No ascites or pneumoperitoneum. Musculoskeletal: Stable postsurgical changes from previous lumbosacral fusion and right total hip arthroplasty. There are degenerative changes in the spine above the fusion. IMPRESSION: 1. No acute findings or explanation for the patient's symptoms. 2. Stable small nonobstructing bilateral renal calculi. No evidence of ureteral calculus or hydronephrosis. 3. Mild distal colonic diverticulosis without evidence of acute  inflammation. 4.  Aortic Atherosclerosis (ICD10-I70.0). Electronically Signed   By: Elsie Perone M.D.   On: 01/22/2024 12:03     Procedures   Medications Ordered in the ED - No data to display                                  Medical Decision Making Patient complains of frequent urination and urgency.  Patient has a history of an enlarged prostate.  Amount and/or Complexity of Data Reviewed Independent Historian: spouse    Details: Patient is here with his wife who is supportive Labs: ordered. Decision-making details documented in ED Course.    Details: Labs ordered reviewed and interpreted.  CBC is normal.  Glucose is 101 Radiology: ordered.    Details: CT renal shows a little bit of stable nonobstructing bilateral renal calculi mild distal colonic diverticulosis.  No acute findings  Risk Prescription drug management. Risk Details: Patient given a prescription for Cipro .  He is advised stop amoxicillin  I will try him on Pyridium  to see if this helps with the spasm.  Patient is advised to follow-up with his primary care physician schedule see urologist for further evaluation.        Final diagnoses:  Dysuria    ED Discharge Orders          Ordered    phenazopyridine  (PYRIDIUM ) 200 MG tablet  3 times daily PRN        01/22/24 1442    ciprofloxacin  (CIPRO ) 500 MG tablet  2 times daily        01/22/24 1442           An After Visit Summary was printed and given to the patient.     Flint Sonny POUR, PA-C 01/22/24 1655    Suzette Pac, MD 01/25/24 0730

## 2024-01-29 DIAGNOSIS — E6609 Other obesity due to excess calories: Secondary | ICD-10-CM | POA: Diagnosis not present

## 2024-01-29 DIAGNOSIS — Z6831 Body mass index (BMI) 31.0-31.9, adult: Secondary | ICD-10-CM | POA: Diagnosis not present

## 2024-01-29 DIAGNOSIS — N401 Enlarged prostate with lower urinary tract symptoms: Secondary | ICD-10-CM | POA: Diagnosis not present

## 2024-02-15 ENCOUNTER — Ambulatory Visit (HOSPITAL_COMMUNITY)
Admission: RE | Admit: 2024-02-15 | Discharge: 2024-02-15 | Disposition: A | Discharge status: Home / Self Care | Source: Ambulatory Visit | Attending: Physician Assistant | Admitting: Physician Assistant

## 2024-02-15 DIAGNOSIS — I77810 Thoracic aortic ectasia: Secondary | ICD-10-CM | POA: Insufficient documentation

## 2024-02-15 DIAGNOSIS — I2510 Atherosclerotic heart disease of native coronary artery without angina pectoris: Secondary | ICD-10-CM | POA: Insufficient documentation

## 2024-02-15 LAB — ECHOCARDIOGRAM COMPLETE
AR max vel: 2.18 cm2
AV Area VTI: 2.25 cm2
AV Area mean vel: 2.19 cm2
AV Mean grad: 7 mmHg
AV Peak grad: 13 mmHg
Ao pk vel: 1.8 m/s
Area-P 1/2: 2.37 cm2
P 1/2 time: 721 ms
S' Lateral: 2.2 cm

## 2024-02-15 NOTE — Progress Notes (Signed)
*  PRELIMINARY RESULTS* Echocardiogram 2D Echocardiogram has been performed.  Patrick Mathews 02/15/2024, 10:13 AM

## 2024-02-16 ENCOUNTER — Ambulatory Visit: Payer: Self-pay | Admitting: Physician Assistant

## 2024-02-16 DIAGNOSIS — I77810 Thoracic aortic ectasia: Secondary | ICD-10-CM

## 2024-02-20 DIAGNOSIS — M4316 Spondylolisthesis, lumbar region: Secondary | ICD-10-CM | POA: Diagnosis not present

## 2024-02-20 DIAGNOSIS — Z6829 Body mass index (BMI) 29.0-29.9, adult: Secondary | ICD-10-CM | POA: Diagnosis not present

## 2024-04-03 ENCOUNTER — Encounter (INDEPENDENT_AMBULATORY_CARE_PROVIDER_SITE_OTHER): Payer: Self-pay | Admitting: Otolaryngology

## 2024-04-03 ENCOUNTER — Ambulatory Visit (INDEPENDENT_AMBULATORY_CARE_PROVIDER_SITE_OTHER): Admitting: Otolaryngology

## 2024-04-03 VITALS — BP 126/73 | HR 58 | Temp 98.0°F | Ht 70.5 in | Wt 215.0 lb

## 2024-04-03 DIAGNOSIS — H6123 Impacted cerumen, bilateral: Secondary | ICD-10-CM | POA: Diagnosis not present

## 2024-04-03 DIAGNOSIS — R0981 Nasal congestion: Secondary | ICD-10-CM

## 2024-04-03 DIAGNOSIS — J310 Chronic rhinitis: Secondary | ICD-10-CM | POA: Diagnosis not present

## 2024-04-03 DIAGNOSIS — J343 Hypertrophy of nasal turbinates: Secondary | ICD-10-CM | POA: Diagnosis not present

## 2024-04-03 DIAGNOSIS — H6983 Other specified disorders of Eustachian tube, bilateral: Secondary | ICD-10-CM | POA: Insufficient documentation

## 2024-04-03 NOTE — Progress Notes (Signed)
 Patient ID: Patrick Mathews, male   DOB: 1951-03-27, 73 y.o.   MRN: [id number]  Follow up: Chronic nasal congestion, middle ear effusion, hearing loss  Discussed the use of AI scribe software for clinical note transcription with the patient, who gave verbal consent to proceed.  History of Present Illness Patrick Mathews is a 73 year old male who presents with nasal congestion and ear wax buildup.  He has not been evaluated for his nasal and ear issues for almost two years. Initially, he had fluid in his ears and significant nasal congestion, which improved with the use of Flonase. He currently uses Flonase occasionally.  In the past month, he experienced a couple of sinus infections, which he self-treated with Benadryl . He has mild nasal congestion at present, which he attributes to pollen and dust exposure, particularly when mowing the yard. No significant hearing problems are reported, and he states that his hearing seems to be okay.  Ear wax buildup has been an ongoing issue, requiring cleaning every six months. He notes that his left ear tends to accumulate more wax than the right. He has not experienced any fluid in his ears recently.  Exam: General: Communicates without difficulty, well nourished, no acute distress. Head: Normocephalic, no evidence injury, no tenderness, facial buttresses intact without stepoff. Face/sinus: No tenderness to palpation and percussion. Facial movement is normal and symmetric. Eyes: PERRL, EOMI. No scleral icterus, conjunctivae clear. Neuro: CN II exam reveals vision grossly intact.  No nystagmus at any point of gaze. Ears: Auricles well formed without lesions.  Bilateral cerumen impaction.  Nose: External evaluation reveals normal support and skin without lesions.  Dorsum is intact.  Anterior rhinoscopy reveals congested mucosa over anterior aspect of inferior turbinates and intact septum.  No purulence noted. Oral:  Oral cavity and oropharynx are intact,  symmetric, without erythema or edema.  Mucosa is moist without lesions. Neck: Full range of motion without pain.  There is no significant lymphadenopathy.  No masses palpable.  Thyroid  bed within normal limits to palpation.  Parotid glands and submandibular glands equal bilaterally without mass.  Trachea is midline. Neuro:  CN 2-12 grossly intact.   Procedure: Bilateral cerumen disimpaction Anesthesia: None Description: Under the operating microscope, the cerumen is carefully removed with a combination of cerumen currette, alligator forceps, and suction catheters.  After the cerumen is removed, the TMs are noted to be normal.  No mass, erythema, or lesions. The patient tolerated the procedure well.    Assessment and Plan Assessment & Plan Bilateral impacted cerumen Bilateral impacted cerumen with the left ear worse than the right. No fluid in the middle ear space, indicating no current infection. Wax accumulation is a recurring issue, requiring regular cleaning. Wax serves a protective function against ear infections. - Otomicroscopy with bilateral cerumen disimpaction.  Chronic rhinitis with nasal mucosal congestion and bilateral inferior turbinate hypertrophy. Chronic/allergic rhinitis with nasal congestion, exacerbated by environmental factors such as pollen and dust. Symptoms are mild but persistent. Occasional use of Flonase has been beneficial. - Use Flonase nasal spray regularly, two sprays in each nostril once a day, especially during spring and fall allergy seasons. - Clinically improved eustachian tube dysfunction.

## 2024-04-16 ENCOUNTER — Other Ambulatory Visit (HOSPITAL_COMMUNITY): Payer: Self-pay | Admitting: Gastroenterology

## 2024-04-16 DIAGNOSIS — K5909 Other constipation: Secondary | ICD-10-CM

## 2024-04-18 ENCOUNTER — Ambulatory Visit (HOSPITAL_COMMUNITY)
Admission: RE | Admit: 2024-04-18 | Discharge: 2024-04-18 | Disposition: A | Discharge status: Home / Self Care | Source: Ambulatory Visit | Attending: Gastroenterology | Admitting: Gastroenterology

## 2024-04-18 DIAGNOSIS — K5909 Other constipation: Secondary | ICD-10-CM | POA: Diagnosis not present

## 2024-04-18 DIAGNOSIS — R195 Other fecal abnormalities: Secondary | ICD-10-CM | POA: Diagnosis not present

## 2024-04-23 DIAGNOSIS — I10 Essential (primary) hypertension: Secondary | ICD-10-CM | POA: Diagnosis not present

## 2024-04-23 DIAGNOSIS — Z125 Encounter for screening for malignant neoplasm of prostate: Secondary | ICD-10-CM | POA: Diagnosis not present

## 2024-04-23 DIAGNOSIS — E785 Hyperlipidemia, unspecified: Secondary | ICD-10-CM | POA: Diagnosis not present

## 2024-04-23 DIAGNOSIS — G4733 Obstructive sleep apnea (adult) (pediatric): Secondary | ICD-10-CM | POA: Diagnosis not present

## 2024-04-23 DIAGNOSIS — N401 Enlarged prostate with lower urinary tract symptoms: Secondary | ICD-10-CM | POA: Diagnosis not present

## 2024-04-23 DIAGNOSIS — R7309 Other abnormal glucose: Secondary | ICD-10-CM | POA: Diagnosis not present

## 2024-04-23 DIAGNOSIS — Z23 Encounter for immunization: Secondary | ICD-10-CM | POA: Diagnosis not present

## 2024-04-23 DIAGNOSIS — K219 Gastro-esophageal reflux disease without esophagitis: Secondary | ICD-10-CM | POA: Diagnosis not present

## 2024-04-23 DIAGNOSIS — E538 Deficiency of other specified B group vitamins: Secondary | ICD-10-CM | POA: Diagnosis not present

## 2024-05-03 ENCOUNTER — Ambulatory Visit

## 2024-10-03 ENCOUNTER — Ambulatory Visit (INDEPENDENT_AMBULATORY_CARE_PROVIDER_SITE_OTHER): Admitting: Otolaryngology

## 2024-12-02 ENCOUNTER — Ambulatory Visit: Admitting: Urology
